# Patient Record
Sex: Male | Born: 1935
Health system: Southern US, Community
[De-identification: ages and names within clinical notes are randomized; demographics above are authoritative.]

## PROBLEM LIST (undated history)

## (undated) DIAGNOSIS — Z8546 Personal history of malignant neoplasm of prostate: Secondary | ICD-10-CM

## (undated) DIAGNOSIS — I447 Left bundle-branch block, unspecified: Secondary | ICD-10-CM

## (undated) DIAGNOSIS — K4090 Unilateral inguinal hernia, without obstruction or gangrene, not specified as recurrent: Secondary | ICD-10-CM

## (undated) DIAGNOSIS — G20 Parkinson's disease: Secondary | ICD-10-CM

## (undated) DIAGNOSIS — Z860100 Personal history of colon polyps, unspecified: Secondary | ICD-10-CM

## (undated) DIAGNOSIS — I10 Essential (primary) hypertension: Secondary | ICD-10-CM

## (undated) DIAGNOSIS — Z7901 Long term (current) use of anticoagulants: Secondary | ICD-10-CM

## (undated) DIAGNOSIS — I6523 Occlusion and stenosis of bilateral carotid arteries: Secondary | ICD-10-CM

## (undated) DIAGNOSIS — E785 Hyperlipidemia, unspecified: Secondary | ICD-10-CM

## (undated) DIAGNOSIS — Z954 Presence of other heart-valve replacement: Secondary | ICD-10-CM

## (undated) DIAGNOSIS — I712 Thoracic aortic aneurysm, without rupture, unspecified: Secondary | ICD-10-CM

## (undated) DIAGNOSIS — K644 Residual hemorrhoidal skin tags: Secondary | ICD-10-CM

## (undated) DIAGNOSIS — B029 Zoster without complications: Secondary | ICD-10-CM

## (undated) DIAGNOSIS — K5909 Other constipation: Secondary | ICD-10-CM

## (undated) DIAGNOSIS — I359 Nonrheumatic aortic valve disorder, unspecified: Secondary | ICD-10-CM

## (undated) HISTORY — DX: Zoster without complications: B02.9

## (undated) HISTORY — DX: Essential (primary) hypertension: I10

## (undated) HISTORY — DX: Personal history of malignant neoplasm of prostate: Z85.46

## (undated) HISTORY — DX: Other constipation: K59.09

## (undated) HISTORY — DX: Long term (current) use of anticoagulants: Z79.01

## (undated) HISTORY — DX: Nonrheumatic aortic valve disorder, unspecified: I35.9

## (undated) HISTORY — DX: Residual hemorrhoidal skin tags: K64.4

## (undated) HISTORY — DX: Thoracic aortic aneurysm, without rupture: I71.2

## (undated) HISTORY — DX: Thoracic aortic aneurysm, without rupture, unspecified: I71.20

## (undated) HISTORY — DX: Left bundle-branch block, unspecified: I44.7

## (undated) HISTORY — PX: TONSILLECTOMY: SUR1361

## (undated) HISTORY — DX: Hyperlipidemia, unspecified: E78.5

## (undated) HISTORY — DX: Presence of other heart-valve replacement: Z95.4

## (undated) HISTORY — DX: Personal history of colonic polyps: Z86.010

## (undated) HISTORY — DX: Personal history of colon polyps, unspecified: Z86.0100

## (undated) HISTORY — PX: HEMORRHOID SURGERY: SHX153

---
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
## 1989-01-17 HISTORY — PX: AORTIC VALVE REPLACEMENT: SHX41

## 1993-01-17 HISTORY — PX: PROSTATECTOMY: SHX69

## 1996-04-24 ENCOUNTER — Encounter: Payer: Self-pay | Admitting: Internal Medicine

## 2001-04-26 ENCOUNTER — Encounter: Payer: Self-pay | Admitting: Internal Medicine

## 2003-11-28 ENCOUNTER — Ambulatory Visit: Payer: Self-pay | Admitting: Cardiology

## 2003-12-24 ENCOUNTER — Ambulatory Visit: Payer: Self-pay | Admitting: *Deleted

## 2004-01-21 ENCOUNTER — Ambulatory Visit: Payer: Self-pay | Admitting: Cardiology

## 2004-02-11 ENCOUNTER — Ambulatory Visit: Payer: Self-pay | Admitting: Cardiology

## 2004-02-25 ENCOUNTER — Ambulatory Visit: Payer: Self-pay | Admitting: Internal Medicine

## 2004-03-24 ENCOUNTER — Ambulatory Visit: Payer: Self-pay | Admitting: Cardiology

## 2004-04-14 ENCOUNTER — Ambulatory Visit: Payer: Self-pay | Admitting: Internal Medicine

## 2004-04-21 ENCOUNTER — Ambulatory Visit: Payer: Self-pay | Admitting: Cardiology

## 2004-04-22 ENCOUNTER — Ambulatory Visit: Payer: Self-pay | Admitting: Internal Medicine

## 2004-05-07 ENCOUNTER — Ambulatory Visit: Payer: Self-pay | Admitting: Cardiology

## 2004-05-19 ENCOUNTER — Ambulatory Visit: Payer: Self-pay | Admitting: Cardiology

## 2004-06-16 ENCOUNTER — Ambulatory Visit: Payer: Self-pay | Admitting: Cardiology

## 2004-07-14 ENCOUNTER — Ambulatory Visit: Payer: Self-pay | Admitting: Cardiology

## 2004-08-11 ENCOUNTER — Ambulatory Visit: Payer: Self-pay | Admitting: Cardiovascular Disease

## 2004-08-13 ENCOUNTER — Ambulatory Visit: Payer: Self-pay | Admitting: Internal Medicine

## 2004-08-31 ENCOUNTER — Ambulatory Visit: Payer: Self-pay

## 2004-08-31 ENCOUNTER — Ambulatory Visit: Payer: Self-pay | Admitting: Cardiology

## 2004-09-02 ENCOUNTER — Ambulatory Visit: Payer: Self-pay | Admitting: Cardiology

## 2004-09-08 ENCOUNTER — Ambulatory Visit: Payer: Self-pay | Admitting: Cardiovascular Disease

## 2004-10-13 ENCOUNTER — Ambulatory Visit: Payer: Self-pay | Admitting: Cardiology

## 2004-11-10 ENCOUNTER — Ambulatory Visit: Payer: Self-pay | Admitting: Cardiology

## 2004-12-01 ENCOUNTER — Ambulatory Visit: Payer: Self-pay | Admitting: Cardiology

## 2004-12-03 ENCOUNTER — Ambulatory Visit: Payer: Self-pay | Admitting: Cardiology

## 2004-12-15 ENCOUNTER — Ambulatory Visit: Payer: Self-pay | Admitting: Cardiology

## 2004-12-29 ENCOUNTER — Ambulatory Visit: Payer: Self-pay | Admitting: Internal Medicine

## 2005-01-13 ENCOUNTER — Ambulatory Visit: Payer: Self-pay | Admitting: *Deleted

## 2005-01-25 ENCOUNTER — Ambulatory Visit: Payer: Self-pay | Admitting: *Deleted

## 2005-01-28 ENCOUNTER — Ambulatory Visit: Payer: Self-pay | Admitting: Cardiology

## 2005-02-16 ENCOUNTER — Ambulatory Visit: Payer: Self-pay | Admitting: Cardiology

## 2005-03-02 ENCOUNTER — Ambulatory Visit: Payer: Self-pay | Admitting: Cardiology

## 2005-03-08 ENCOUNTER — Ambulatory Visit: Payer: Self-pay | Admitting: Cardiology

## 2005-03-09 ENCOUNTER — Ambulatory Visit: Payer: Self-pay | Admitting: Cardiology

## 2005-03-16 ENCOUNTER — Ambulatory Visit: Payer: Self-pay | Admitting: Cardiology

## 2005-04-13 ENCOUNTER — Ambulatory Visit: Payer: Self-pay | Admitting: Cardiology

## 2005-05-11 ENCOUNTER — Ambulatory Visit: Payer: Self-pay | Admitting: Cardiology

## 2005-06-08 ENCOUNTER — Ambulatory Visit: Payer: Self-pay | Admitting: Internal Medicine

## 2005-06-22 ENCOUNTER — Ambulatory Visit: Payer: Self-pay | Admitting: Cardiology

## 2005-07-04 ENCOUNTER — Ambulatory Visit: Payer: Self-pay | Admitting: Cardiology

## 2005-07-27 ENCOUNTER — Ambulatory Visit: Payer: Self-pay | Admitting: Cardiovascular Disease

## 2005-08-18 ENCOUNTER — Ambulatory Visit: Payer: Self-pay | Admitting: Internal Medicine

## 2005-08-23 ENCOUNTER — Ambulatory Visit: Payer: Self-pay | Admitting: Internal Medicine

## 2005-08-24 ENCOUNTER — Ambulatory Visit: Payer: Self-pay | Admitting: Cardiology

## 2005-09-06 ENCOUNTER — Ambulatory Visit: Payer: Self-pay | Admitting: Cardiology

## 2005-09-14 ENCOUNTER — Ambulatory Visit: Payer: Self-pay | Admitting: Cardiology

## 2005-09-28 ENCOUNTER — Ambulatory Visit: Payer: Self-pay | Admitting: Internal Medicine

## 2005-10-19 ENCOUNTER — Ambulatory Visit: Payer: Self-pay | Admitting: Cardiology

## 2005-11-16 ENCOUNTER — Ambulatory Visit: Payer: Self-pay | Admitting: Cardiovascular Disease

## 2005-12-09 ENCOUNTER — Ambulatory Visit: Payer: Self-pay | Admitting: Cardiology

## 2006-01-04 ENCOUNTER — Ambulatory Visit: Payer: Self-pay | Admitting: Cardiology

## 2006-02-01 ENCOUNTER — Ambulatory Visit: Payer: Self-pay | Admitting: Cardiology

## 2006-03-01 ENCOUNTER — Ambulatory Visit: Payer: Self-pay | Admitting: Cardiology

## 2006-03-21 ENCOUNTER — Ambulatory Visit: Payer: Self-pay | Admitting: Cardiology

## 2006-03-21 LAB — CONVERTED CEMR LAB
BUN: 19 mg/dL (ref 6–23)
Calcium: 9.1 mg/dL (ref 8.4–10.5)
Creatinine, Ser: 1.2 mg/dL (ref 0.4–1.5)
GFR calc Af Amer: 76 mL/min
GFR calc non Af Amer: 63 mL/min

## 2006-03-29 ENCOUNTER — Ambulatory Visit: Payer: Self-pay | Admitting: Cardiology

## 2006-04-26 ENCOUNTER — Ambulatory Visit: Payer: Self-pay | Admitting: Cardiology

## 2006-05-24 ENCOUNTER — Ambulatory Visit: Payer: Self-pay | Admitting: Cardiology

## 2006-06-07 ENCOUNTER — Ambulatory Visit: Payer: Self-pay | Admitting: Cardiology

## 2006-06-19 ENCOUNTER — Ambulatory Visit: Payer: Self-pay | Admitting: Cardiovascular Disease

## 2006-07-05 ENCOUNTER — Ambulatory Visit: Payer: Self-pay | Admitting: Cardiology

## 2006-08-02 ENCOUNTER — Ambulatory Visit: Payer: Self-pay | Admitting: Cardiovascular Disease

## 2006-08-30 ENCOUNTER — Ambulatory Visit: Payer: Self-pay | Admitting: Cardiology

## 2006-09-13 ENCOUNTER — Ambulatory Visit: Payer: Self-pay | Admitting: Internal Medicine

## 2006-09-14 ENCOUNTER — Ambulatory Visit: Payer: Self-pay | Admitting: Internal Medicine

## 2006-09-14 LAB — CONVERTED CEMR LAB
AST: 27 units/L (ref 0–37)
Alkaline Phosphatase: 70 units/L (ref 39–117)
BUN: 20 mg/dL (ref 6–23)
Basophils Absolute: 0 10*3/uL (ref 0.0–0.1)
CO2: 32 meq/L (ref 19–32)
Chloride: 107 meq/L (ref 96–112)
GFR calc Af Amer: 84 mL/min
GFR calc non Af Amer: 69 mL/min
Glucose, Bld: 107 mg/dL — ABNORMAL HIGH (ref 70–99)
HCT: 40.7 % (ref 39.0–52.0)
HDL: 35.8 mg/dL — ABNORMAL LOW (ref >39.0)
Hemoglobin, Urine: NEGATIVE
Hemoglobin: 14.4 g/dL (ref 13.0–17.0)
LDL Cholesterol: 62 mg/dL (ref 0–99)
Leukocytes, UA: NEGATIVE
MCHC: 35.5 g/dL (ref 30.0–36.0)
Monocytes Absolute: 0.5 10*3/uL (ref 0.2–0.7)
Neutro Abs: 2.3 10*3/uL (ref 1.4–7.7)
Neutrophils Relative %: 54.4 % (ref 43.0–77.0)
Nitrite: NEGATIVE
PSA: 0.01 ng/mL — ABNORMAL LOW (ref 0.10–4.00)
Potassium: 4.4 meq/L (ref 3.5–5.1)
Sodium: 144 meq/L (ref 135–145)
TSH: 3.04 microintl units/mL (ref 0.35–5.50)
pH: 6 (ref 5.0–8.0)

## 2006-09-19 ENCOUNTER — Ambulatory Visit: Payer: Self-pay | Admitting: Cardiology

## 2006-09-21 ENCOUNTER — Ambulatory Visit: Payer: Self-pay | Admitting: Internal Medicine

## 2006-09-26 ENCOUNTER — Encounter: Payer: Self-pay | Admitting: Cardiology

## 2006-09-26 ENCOUNTER — Ambulatory Visit: Payer: Self-pay

## 2006-10-04 ENCOUNTER — Ambulatory Visit: Payer: Self-pay | Admitting: Cardiology

## 2006-10-05 ENCOUNTER — Ambulatory Visit: Payer: Self-pay | Admitting: Internal Medicine

## 2006-11-01 ENCOUNTER — Ambulatory Visit: Payer: Self-pay | Admitting: Cardiology

## 2006-11-29 ENCOUNTER — Ambulatory Visit: Payer: Self-pay | Admitting: Cardiology

## 2006-12-27 ENCOUNTER — Ambulatory Visit: Payer: Self-pay | Admitting: Cardiology

## 2007-01-10 ENCOUNTER — Ambulatory Visit: Payer: Self-pay | Admitting: Cardiology

## 2007-02-07 ENCOUNTER — Ambulatory Visit: Payer: Self-pay | Admitting: Cardiovascular Disease

## 2007-03-07 ENCOUNTER — Ambulatory Visit: Payer: Self-pay | Admitting: Cardiology

## 2007-03-20 ENCOUNTER — Ambulatory Visit: Payer: Self-pay | Admitting: Cardiology

## 2007-03-20 LAB — CONVERTED CEMR LAB
CO2: 33 meq/L — ABNORMAL HIGH (ref 19–32)
Chloride: 102 meq/L (ref 96–112)
GFR calc non Af Amer: 69 mL/min
Glucose, Bld: 96 mg/dL (ref 70–99)
Potassium: 4.1 meq/L (ref 3.5–5.1)

## 2007-03-22 ENCOUNTER — Ambulatory Visit: Payer: Self-pay | Admitting: Cardiovascular Disease

## 2007-03-28 ENCOUNTER — Ambulatory Visit: Payer: Self-pay | Admitting: Cardiology

## 2007-04-10 ENCOUNTER — Encounter: Payer: Self-pay | Admitting: Internal Medicine

## 2007-04-25 ENCOUNTER — Ambulatory Visit: Payer: Self-pay | Admitting: Internal Medicine

## 2007-05-17 ENCOUNTER — Encounter: Payer: Self-pay | Admitting: Internal Medicine

## 2007-05-17 ENCOUNTER — Ambulatory Visit: Payer: Self-pay | Admitting: Cardiothoracic Surgery

## 2007-05-22 ENCOUNTER — Ambulatory Visit: Payer: Self-pay | Admitting: Internal Medicine

## 2007-05-23 ENCOUNTER — Ambulatory Visit: Payer: Self-pay | Admitting: Cardiology

## 2007-06-06 ENCOUNTER — Ambulatory Visit: Payer: Self-pay | Admitting: Cardiovascular Disease

## 2007-06-08 ENCOUNTER — Encounter: Payer: Self-pay | Admitting: Internal Medicine

## 2007-06-08 ENCOUNTER — Ambulatory Visit: Payer: Self-pay | Admitting: Internal Medicine

## 2007-06-13 ENCOUNTER — Encounter: Payer: Self-pay | Admitting: Internal Medicine

## 2007-07-04 ENCOUNTER — Ambulatory Visit: Payer: Self-pay | Admitting: Cardiology

## 2007-08-01 ENCOUNTER — Ambulatory Visit: Payer: Self-pay | Admitting: Cardiovascular Disease

## 2007-08-22 ENCOUNTER — Ambulatory Visit: Payer: Self-pay | Admitting: Internal Medicine

## 2007-09-19 ENCOUNTER — Ambulatory Visit: Payer: Self-pay | Admitting: Cardiology

## 2007-10-02 ENCOUNTER — Ambulatory Visit: Payer: Self-pay | Admitting: Cardiology

## 2007-10-02 ENCOUNTER — Ambulatory Visit: Payer: Self-pay | Admitting: Internal Medicine

## 2007-10-02 LAB — CONVERTED CEMR LAB
ALT: 29 units/L (ref 0–53)
AST: 31 units/L (ref 0–37)
BUN: 18 mg/dL (ref 6–23)
Basophils Absolute: 0 10*3/uL (ref 0.0–0.1)
Bilirubin Urine: NEGATIVE
Bilirubin, Direct: 0.4 mg/dL — ABNORMAL HIGH (ref 0.0–0.3)
CO2: 32 meq/L (ref 19–32)
Chloride: 108 meq/L (ref 96–112)
Cholesterol: 129 mg/dL (ref 0–200)
Creatinine, Ser: 1 mg/dL (ref 0.4–1.5)
GFR calc non Af Amer: 77 mL/min
Glucose, Bld: 112 mg/dL — ABNORMAL HIGH (ref 70–99)
HCT: 40.5 % (ref 39.0–52.0)
Hemoglobin, Urine: NEGATIVE
Ketones, ur: NEGATIVE mg/dL
Leukocytes, UA: NEGATIVE
MCHC: 34.6 g/dL (ref 30.0–36.0)
MCV: 91 fL (ref 78.0–100.0)
Monocytes Absolute: 0.4 10*3/uL (ref 0.1–1.0)
Monocytes Relative: 13.5 % — ABNORMAL HIGH (ref 3.0–12.0)
Neutro Abs: 1.9 10*3/uL (ref 1.4–7.7)
Neutrophils Relative %: 55 % (ref 43.0–77.0)
PSA: 0.01 ng/mL — ABNORMAL LOW (ref 0.10–4.00)
Platelets: 239 10*3/uL (ref 150–400)
Potassium: 4 meq/L (ref 3.5–5.1)
RDW: 13 % (ref 11.5–14.6)
Specific Gravity, Urine: 1.01 (ref 1.000–1.03)
Total Bilirubin: 1.9 mg/dL — ABNORMAL HIGH (ref 0.3–1.2)
Total CHOL/HDL Ratio: 3.7
Total Protein: 6.6 g/dL (ref 6.0–8.3)
VLDL: 15 mg/dL (ref 0–40)

## 2007-10-23 ENCOUNTER — Ambulatory Visit: Payer: Self-pay | Admitting: Internal Medicine

## 2007-10-24 ENCOUNTER — Ambulatory Visit: Payer: Self-pay | Admitting: Cardiology

## 2007-10-24 DIAGNOSIS — E785 Hyperlipidemia, unspecified: Secondary | ICD-10-CM

## 2007-10-24 DIAGNOSIS — Z8546 Personal history of malignant neoplasm of prostate: Secondary | ICD-10-CM

## 2007-10-24 DIAGNOSIS — I10 Essential (primary) hypertension: Secondary | ICD-10-CM

## 2007-10-24 DIAGNOSIS — Z8669 Personal history of other diseases of the nervous system and sense organs: Secondary | ICD-10-CM

## 2007-11-01 ENCOUNTER — Ambulatory Visit: Payer: Self-pay | Admitting: Cardiology

## 2007-11-21 ENCOUNTER — Ambulatory Visit: Payer: Self-pay | Admitting: Cardiology

## 2007-11-29 ENCOUNTER — Ambulatory Visit: Payer: Self-pay | Admitting: Cardiothoracic Surgery

## 2007-12-19 ENCOUNTER — Ambulatory Visit: Payer: Self-pay | Admitting: Cardiology

## 2008-01-16 ENCOUNTER — Ambulatory Visit: Payer: Self-pay | Admitting: Cardiology

## 2008-02-13 ENCOUNTER — Ambulatory Visit: Payer: Self-pay | Admitting: Cardiovascular Disease

## 2008-03-12 ENCOUNTER — Ambulatory Visit: Payer: Self-pay | Admitting: Cardiology

## 2008-03-21 ENCOUNTER — Ambulatory Visit: Payer: Self-pay | Admitting: Cardiology

## 2008-03-21 LAB — CONVERTED CEMR LAB
BUN: 19 mg/dL (ref 6–23)
Calcium: 9.7 mg/dL (ref 8.4–10.5)
Chloride: 102 meq/L (ref 96–112)
Creatinine, Ser: 1 mg/dL (ref 0.4–1.5)
Glucose, Bld: 85 mg/dL (ref 70–99)
Potassium: 3.7 meq/L (ref 3.5–5.1)
Sodium: 141 meq/L (ref 135–145)

## 2008-04-09 ENCOUNTER — Ambulatory Visit: Payer: Self-pay | Admitting: Cardiology

## 2008-05-07 ENCOUNTER — Ambulatory Visit: Payer: Self-pay | Admitting: Cardiology

## 2008-05-28 ENCOUNTER — Ambulatory Visit: Payer: Self-pay | Admitting: Cardiology

## 2008-05-30 ENCOUNTER — Telehealth: Payer: Self-pay | Admitting: Internal Medicine

## 2008-06-17 ENCOUNTER — Encounter: Payer: Self-pay | Admitting: *Deleted

## 2008-06-25 ENCOUNTER — Ambulatory Visit: Payer: Self-pay | Admitting: Internal Medicine

## 2008-06-25 LAB — CONVERTED CEMR LAB: Protime: 18.8

## 2008-07-23 ENCOUNTER — Encounter: Payer: Self-pay | Admitting: *Deleted

## 2008-07-23 ENCOUNTER — Ambulatory Visit: Payer: Self-pay | Admitting: Cardiovascular Disease

## 2008-07-31 ENCOUNTER — Telehealth (INDEPENDENT_AMBULATORY_CARE_PROVIDER_SITE_OTHER): Payer: Self-pay | Admitting: *Deleted

## 2008-08-04 ENCOUNTER — Ambulatory Visit: Payer: Self-pay | Admitting: Internal Medicine

## 2008-08-04 DIAGNOSIS — K5909 Other constipation: Secondary | ICD-10-CM

## 2008-08-12 ENCOUNTER — Telehealth: Payer: Self-pay | Admitting: Internal Medicine

## 2008-08-20 ENCOUNTER — Ambulatory Visit: Payer: Self-pay | Admitting: Cardiology

## 2008-08-20 LAB — CONVERTED CEMR LAB: POC INR: 2.8

## 2008-09-12 ENCOUNTER — Ambulatory Visit: Payer: Self-pay | Admitting: Internal Medicine

## 2008-09-12 DIAGNOSIS — Z860100 Personal history of colon polyps, unspecified: Secondary | ICD-10-CM | POA: Insufficient documentation

## 2008-09-12 DIAGNOSIS — K644 Residual hemorrhoidal skin tags: Secondary | ICD-10-CM | POA: Insufficient documentation

## 2008-09-17 ENCOUNTER — Ambulatory Visit: Payer: Self-pay | Admitting: Cardiology

## 2008-09-17 LAB — CONVERTED CEMR LAB: POC INR: 3

## 2008-09-20 DIAGNOSIS — I359 Nonrheumatic aortic valve disorder, unspecified: Secondary | ICD-10-CM

## 2008-09-29 ENCOUNTER — Ambulatory Visit: Payer: Self-pay | Admitting: Cardiology

## 2008-09-29 DIAGNOSIS — I712 Thoracic aortic aneurysm, without rupture: Secondary | ICD-10-CM

## 2008-09-29 DIAGNOSIS — Z954 Presence of other heart-valve replacement: Secondary | ICD-10-CM

## 2008-09-30 LAB — CONVERTED CEMR LAB
Chloride: 105 meq/L (ref 96–112)
Creatinine, Ser: 1.1 mg/dL (ref 0.4–1.5)
Magnesium: 2.2 mg/dL (ref 1.5–2.5)
Potassium: 3.9 meq/L (ref 3.5–5.1)
Sodium: 143 meq/L (ref 135–145)

## 2008-10-15 ENCOUNTER — Ambulatory Visit: Payer: Self-pay | Admitting: Cardiology

## 2008-10-15 LAB — CONVERTED CEMR LAB: POC INR: 2.7

## 2008-11-07 ENCOUNTER — Ambulatory Visit: Payer: Self-pay | Admitting: Internal Medicine

## 2008-11-12 ENCOUNTER — Ambulatory Visit: Payer: Self-pay | Admitting: Cardiology

## 2008-11-12 LAB — CONVERTED CEMR LAB: POC INR: 2.5

## 2008-11-13 ENCOUNTER — Ambulatory Visit: Payer: Self-pay | Admitting: Internal Medicine

## 2008-11-13 LAB — CONVERTED CEMR LAB
ALT: 28 units/L (ref 0–53)
AST: 27 units/L (ref 0–37)
BUN: 19 mg/dL (ref 6–23)
Bilirubin, Direct: 0.4 mg/dL — ABNORMAL HIGH (ref 0.0–0.3)
Eosinophils Relative: 3.2 % (ref 0.0–5.0)
GFR calc non Af Amer: 76.95 mL/min (ref >60)
HCT: 42.3 % (ref 39.0–52.0)
Hemoglobin, Urine: NEGATIVE
LDL Cholesterol: 68 mg/dL (ref 0–99)
Lymphs Abs: 1.1 10*3/uL (ref 0.7–4.0)
Monocytes Relative: 12.9 % — ABNORMAL HIGH (ref 3.0–12.0)
Neutrophils Relative %: 55 % (ref 43.0–77.0)
Nitrite: NEGATIVE
PSA: 0.01 ng/mL — ABNORMAL LOW (ref 0.10–4.00)
Platelets: 205 10*3/uL (ref 150.0–400.0)
Potassium: 4.2 meq/L (ref 3.5–5.1)
Total Bilirubin: 2.1 mg/dL — ABNORMAL HIGH (ref 0.3–1.2)
Total CHOL/HDL Ratio: 3
Urobilinogen, UA: 1 (ref 0.0–1.0)
VLDL: 11.6 mg/dL (ref 0.0–40.0)
WBC: 4 10*3/uL — ABNORMAL LOW (ref 4.5–10.5)

## 2008-11-20 ENCOUNTER — Ambulatory Visit: Payer: Self-pay | Admitting: Internal Medicine

## 2008-12-04 ENCOUNTER — Ambulatory Visit: Payer: Self-pay | Admitting: Cardiothoracic Surgery

## 2008-12-04 ENCOUNTER — Encounter: Payer: Self-pay | Admitting: Cardiology

## 2008-12-10 ENCOUNTER — Ambulatory Visit: Payer: Self-pay | Admitting: Cardiology

## 2009-01-07 ENCOUNTER — Ambulatory Visit: Payer: Self-pay | Admitting: Cardiology

## 2009-02-04 ENCOUNTER — Ambulatory Visit: Payer: Self-pay | Admitting: Cardiology

## 2009-03-04 ENCOUNTER — Ambulatory Visit: Payer: Self-pay | Admitting: Cardiology

## 2009-03-04 LAB — CONVERTED CEMR LAB: POC INR: 2.5

## 2009-04-01 ENCOUNTER — Ambulatory Visit: Payer: Self-pay | Admitting: Cardiology

## 2009-04-29 ENCOUNTER — Ambulatory Visit: Payer: Self-pay | Admitting: Cardiovascular Disease

## 2009-05-20 ENCOUNTER — Ambulatory Visit: Payer: Self-pay | Admitting: Cardiovascular Disease

## 2009-05-20 LAB — CONVERTED CEMR LAB: POC INR: 2.7

## 2009-06-17 ENCOUNTER — Ambulatory Visit: Payer: Self-pay | Admitting: Cardiology

## 2009-06-17 LAB — CONVERTED CEMR LAB: POC INR: 2.9

## 2009-07-22 ENCOUNTER — Ambulatory Visit: Payer: Self-pay | Admitting: Cardiology

## 2009-08-10 ENCOUNTER — Ambulatory Visit: Payer: Self-pay | Admitting: Cardiology

## 2009-08-19 ENCOUNTER — Ambulatory Visit: Payer: Self-pay | Admitting: Cardiovascular Disease

## 2009-08-19 LAB — CONVERTED CEMR LAB: POC INR: 2.8

## 2009-09-16 ENCOUNTER — Ambulatory Visit: Payer: Self-pay | Admitting: Cardiovascular Disease

## 2009-09-16 LAB — CONVERTED CEMR LAB: POC INR: 2.5

## 2009-10-14 ENCOUNTER — Ambulatory Visit: Payer: Self-pay | Admitting: Cardiology

## 2009-11-04 ENCOUNTER — Ambulatory Visit: Payer: Self-pay | Admitting: Cardiology

## 2009-11-20 ENCOUNTER — Ambulatory Visit: Payer: Self-pay | Admitting: Internal Medicine

## 2009-11-20 LAB — CONVERTED CEMR LAB
AST: 26 units/L (ref 0–37)
BUN: 20 mg/dL (ref 6–23)
Basophils Absolute: 0 10*3/uL (ref 0.0–0.1)
Bilirubin Urine: NEGATIVE
CO2: 31 meq/L (ref 19–32)
Cholesterol: 124 mg/dL (ref 0–200)
Eosinophils Absolute: 0.1 10*3/uL (ref 0.0–0.7)
GFR calc non Af Amer: 73.35 mL/min (ref >60)
Glucose, Bld: 94 mg/dL (ref 70–99)
HCT: 41.2 % (ref 39.0–52.0)
HDL: 41.4 mg/dL (ref >39.00)
Hemoglobin, Urine: NEGATIVE
Ketones, ur: NEGATIVE mg/dL
Lymphs Abs: 1.2 10*3/uL (ref 0.7–4.0)
MCHC: 34.8 g/dL (ref 30.0–36.0)
Monocytes Absolute: 0.5 10*3/uL (ref 0.1–1.0)
Monocytes Relative: 11.9 % (ref 3.0–12.0)
Platelets: 215 10*3/uL (ref 150.0–400.0)
Potassium: 4.1 meq/L (ref 3.5–5.1)
RDW: 13.2 % (ref 11.5–14.6)
TSH: 3.8 microintl units/mL (ref 0.35–5.50)
Total Bilirubin: 1.9 mg/dL — ABNORMAL HIGH (ref 0.3–1.2)
Total Protein, Urine: NEGATIVE mg/dL
Triglycerides: 59 mg/dL (ref 0.0–149.0)
Urine Glucose: NEGATIVE mg/dL
VLDL: 11.8 mg/dL (ref 0.0–40.0)
pH: 7 (ref 5.0–8.0)

## 2009-11-25 ENCOUNTER — Ambulatory Visit: Payer: Self-pay | Admitting: Cardiology

## 2009-11-26 ENCOUNTER — Ambulatory Visit: Payer: Self-pay | Admitting: Internal Medicine

## 2009-11-26 DIAGNOSIS — B029 Zoster without complications: Secondary | ICD-10-CM | POA: Insufficient documentation

## 2009-11-26 HISTORY — DX: Zoster without complications: B02.9

## 2009-12-23 ENCOUNTER — Ambulatory Visit: Payer: Self-pay | Admitting: Cardiology

## 2009-12-23 LAB — CONVERTED CEMR LAB: POC INR: 2.8

## 2010-01-20 ENCOUNTER — Ambulatory Visit: Admission: RE | Admit: 2010-01-20 | Discharge: 2010-01-20 | Payer: Self-pay | Source: Home / Self Care

## 2010-01-20 LAB — CONVERTED CEMR LAB: POC INR: 2.1

## 2010-02-03 ENCOUNTER — Ambulatory Visit: Admission: RE | Admit: 2010-02-03 | Discharge: 2010-02-03 | Payer: Self-pay | Source: Home / Self Care

## 2010-02-16 NOTE — Medication Information (Signed)
Summary: rov/jaj  Anticoagulant Therapy  Managed by: Bethena Midget, RN, BSN Referring MD: Shawnie Pons MD PCP: Jacques Navy MD Supervising MD: Myrtis Ser MD, Tinnie Gens Indication 1: Aortic Valve Replacement (ICD-V43.3) Indication 2: Hyperlipidemia (ICD-Incr L) Lab Used: [facility] Site: [facility] INR POC 2.4 INR RANGE 2.5 - 3.5  Dietary changes: no    Health status changes: no    Bleeding/hemorrhagic complications: no    Recent/future hospitalizations: no    Any changes in medication regimen? no    Recent/future dental: no  Any missed doses?: no       Is patient compliant with meds? yes       Allergies: 1)  ! Codeine  Anticoagulation Management History:      The patient is taking warfarin and comes in today for a routine follow up visit.  Positive risk factors for bleeding include an age of 75 years or older.  The bleeding index is 'intermediate risk'.  Positive CHADS2 values include History of HTN and Age > 75 years old.  The start date was 05/02/1997.  Anticoagulation responsible provider: Myrtis Ser MD, Tinnie Gens.  INR POC: 2.4.  Cuvette Lot#: [id number].  Exp: 11/2010.    Anticoagulation Management Assessment/Plan:      The patient's current anticoagulation dose is Coumadin 5 mg tabs: Take as directed by coumadin clinic..  The target INR is 2.5 - 3.5.  The next INR is due 11/25/2009.  Anticoagulation instructions were given to patient.  Results were reviewed/authorized by Bethena Midget, RN, BSN.  He was notified by Bethena Midget, RN, BSN.         Prior Anticoagulation Instructions: INR 2.2  Take 2 tablets today. Then resume regular dose of 1 1/2 tablets everyday except 1 tablet on Mondays, Wednesdays, and Fridays. Re-check INR 3 weeks.   Current Anticoagulation Instructions: INR 2.4 Today take 7.5mg s then resume 7.5mg s daily except 5mg s on Mondays, Wednesdays and Fridays. Recheck in 3 weeks.

## 2010-02-16 NOTE — Medication Information (Signed)
Summary: rov/tm  Anticoagulant Therapy  Managed by: Cloyde Reams, RN, BSN Referring MD: Shawnie Pons MD PCP: Jacques Navy MD Supervising MD: Shirlee Latch MD, Neveen Daponte Indication 1: Aortic Valve Replacement (ICD-V43.3) Indication 2: Hyperlipidemia (ICD-Incr L) Lab Used: [facility] Site: [facility] INR POC 2.9 INR RANGE 2.5 - 3.5  Dietary changes: no    Health status changes: no    Bleeding/hemorrhagic complications: no    Recent/future hospitalizations: no    Any changes in medication regimen? yes       Details: Took OTC cold med, Tylenol based.   Recent/future dental: no  Any missed doses?: no       Is patient compliant with meds? yes       Allergies: 1)  ! Codeine  Anticoagulation Management History:      The patient is taking warfarin and comes in today for a routine follow up visit.  Positive risk factors for bleeding include an age of 23 years or older.  The bleeding index is 'intermediate risk'.  Positive CHADS2 values include History of HTN and Age > 33 years old.  The start date was 05/02/1997.  Anticoagulation responsible provider: Shirlee Latch MD, Catheryne Deford.  INR POC: 2.9.  Cuvette Lot#: [id number].  Exp: 09/2010.    Anticoagulation Management Assessment/Plan:      The patient's current anticoagulation dose is Coumadin 5 mg tabs: Take as directed by coumadin clinic..  The target INR is 2.5 - 3.5.  The next INR is due 08/19/2009.  Anticoagulation instructions were given to patient.  Results were reviewed/authorized by Cloyde Reams, RN, BSN.  He was notified by Cloyde Reams RN.         Prior Anticoagulation Instructions: INR 2.9 Continue 7.5mg  everyday except 5mg  on Mondays, Wednesdays and Fridays. Recheck in 4 weeks.   Current Anticoagulation Instructions: INR 2.9  Continue on same dosage 7.5mg  daily except 5mg  on Mondays, Wednesdays, and Fridays.  Recheck in 4 weeks.

## 2010-02-16 NOTE — Medication Information (Signed)
Summary: rov/ewj  Anticoagulant Therapy  Managed by: Weston Brass, PharmD Referring MD: Shawnie Pons MD PCP: Jacques Navy MD Supervising MD: Eden Emms MD, Theron Arista Indication 1: Aortic Valve Replacement (ICD-V43.3) Indication 2: Hyperlipidemia (ICD-Incr L) Lab Used: [facility] Site: [facility] INR POC 2.0 INR RANGE 2.5 - 3.5  Dietary changes: no    Health status changes: no    Bleeding/hemorrhagic complications: no    Recent/future hospitalizations: no    Any changes in medication regimen? no    Recent/future dental: no  Any missed doses?: yes     Details: not sure if he missed yesterdays dose  Is patient compliant with meds? yes       Allergies: 1)  ! Codeine  Anticoagulation Management History:      The patient is taking warfarin and comes in today for a routine follow up visit.  Positive risk factors for bleeding include an age of 75 years or older.  The bleeding index is 'intermediate risk'.  Positive CHADS2 values include History of HTN and Age > 75 years old.  The start date was 05/02/1997.  Anticoagulation responsible provider: Eden Emms MD, Theron Arista.  INR POC: 2.0.  Cuvette Lot#: [id number].  Exp: 05/2010.    Anticoagulation Management Assessment/Plan:      The patient's current anticoagulation dose is Coumadin 5 mg tabs: Take as directed by coumadin clinic..  The target INR is 2.5 - 3.5.  The next INR is due 05/20/2009.  Anticoagulation instructions were given to patient.  Results were reviewed/authorized by Weston Brass, PharmD.  He was notified by Weston Brass PharmD.         Prior Anticoagulation Instructions: INR 2.5  Continue on same dosage 7.5mg  daily except 5mg  on Mondays, Wednesdays, and Fridays.  Recheck in 4 weeks.    Current Anticoagulation Instructions: INR 2.0 Today 10mg s then resume 7.5mg s daily except 5mg s on Mondays, Wednesdays and Fridays. Recheck in 3 weeks.

## 2010-02-16 NOTE — Medication Information (Signed)
Summary: rov coumadin - [facility]  Anticoagulant Therapy  Managed by: Weston Brass, PharmD Referring MD: Shawnie Pons MD PCP: Jacques Navy MD Supervising MD: Cassell Clement MD Indication 1: Aortic Valve Replacement (ICD-V43.3) Indication 2: Hyperlipidemia (ICD-Incr L) Lab Used: [facility] Site: [facility] INR POC 2.8 INR RANGE 2.5 - 3.5  Dietary changes: no    Health status changes: no    Bleeding/hemorrhagic complications: no    Recent/future hospitalizations: no    Any changes in medication regimen? yes       Details: changed from miralax to MOM  Recent/future dental: no  Any missed doses?: no       Is patient compliant with meds? yes       Allergies: 1)  ! Codeine  Anticoagulation Management History:      The patient is taking warfarin and comes in today for a routine follow up visit.  Positive risk factors for bleeding include an age of 75 years or older.  The bleeding index is 'intermediate risk'.  Positive CHADS2 values include History of HTN and Age > 32 years old.  The start date was 05/02/1997.  Anticoagulation responsible Fynlee Rowlands: Cassell Clement MD.  INR POC: 2.8.  Cuvette Lot#: [id number].  Exp: 10/2010.    Anticoagulation Management Assessment/Plan:      The patient's current anticoagulation dose is Coumadin 5 mg tabs: Take as directed by coumadin clinic..  The target INR is 2.5 - 3.5.  The next INR is due 01/20/2010.  Anticoagulation instructions were given to patient.  Results were reviewed/authorized by Weston Brass, PharmD.  He was notified by Weston Brass PharmD.         Prior Anticoagulation Instructions: INR 2.7  Coumadin 5mg  tab, take 1 tab Mon, Wed, Fri and 1.5tab on Tue, Thur, Sat and Sun  Current Anticoagulation Instructions: INR 2.8  Continue same dose of 1 1/2 tablets every day except 1 tablet on Monday, Wednesday and Friday.  Recheck INR in 4 weeks.

## 2010-02-16 NOTE — Medication Information (Signed)
Summary: rov/sl  Anticoagulant Therapy  Managed by: Weston Brass, PharmD Referring MD: Shawnie Pons MD PCP: Jacques Navy MD Supervising MD: Myrtis Ser MD, Tinnie Gens Indication 1: Aortic Valve Replacement (ICD-V43.3) Indication 2: Hyperlipidemia (ICD-Incr L) Lab Used: [facility] Site: [facility] INR POC 2.2 INR RANGE 2.5 - 3.5  Dietary changes: no    Health status changes: no    Bleeding/hemorrhagic complications: no    Recent/future hospitalizations: no    Any changes in medication regimen? no    Recent/future dental: no  Any missed doses?: no       Is patient compliant with meds? yes       Allergies: 1)  ! Codeine  Anticoagulation Management History:      The patient is taking warfarin and comes in today for a routine follow up visit.  Positive risk factors for bleeding include an age of 75 years or older.  The bleeding index is 'intermediate risk'.  Positive CHADS2 values include History of HTN and Age > 32 years old.  The start date was 05/02/1997.  Anticoagulation responsible provider: Myrtis Ser MD, Tinnie Gens.  INR POC: 2.2.  Cuvette Lot#: [id number].  Exp: 11/2010.    Anticoagulation Management Assessment/Plan:      The patient's current anticoagulation dose is Coumadin 5 mg tabs: Take as directed by coumadin clinic..  The target INR is 2.5 - 3.5.  The next INR is due 11/04/2009.  Anticoagulation instructions were given to patient.  Results were reviewed/authorized by Weston Brass, PharmD.  He was notified by Harrel Carina, PharmD candidate.         Prior Anticoagulation Instructions: INR 2.5  Continue taking Coumadin 1.5 tabs (7.5 mg) on Sun, Tues, Thur, Sat and Coumadin 1 tab (5 mg) on Mon, Wed, Fri. Return to clinic in 4 weeks.   Current Anticoagulation Instructions: INR 2.2  Take 2 tablets today. Then resume regular dose of 1 1/2 tablets everyday except 1 tablet on Mondays, Wednesdays, and Fridays. Re-check INR 3 weeks.

## 2010-02-16 NOTE — Medication Information (Signed)
Summary: rov/tm  Anticoagulant Therapy  Managed by: Eda Keys, PharmD Referring MD: Shawnie Pons MD PCP: Jacques Navy MD Supervising MD: Eden Emms MD, Theron Arista Indication 1: Aortic Valve Replacement (ICD-V43.3) Indication 2: Hyperlipidemia (ICD-Incr L) Lab Used: [facility] Site: [facility] INR POC 2.7 INR RANGE 2.5 - 3.5  Dietary changes: no    Health status changes: no    Bleeding/hemorrhagic complications: no    Recent/future hospitalizations: no    Any changes in medication regimen? no    Recent/future dental: no  Any missed doses?: no       Is patient compliant with meds? yes       Allergies: 1)  ! Codeine  Anticoagulation Management History:      The patient is taking warfarin and comes in today for a routine follow up visit.  Positive risk factors for bleeding include an age of 75 years or older.  The bleeding index is 'intermediate risk'.  Positive CHADS2 values include History of HTN and Age > 63 years old.  The start date was 05/02/1997.  Anticoagulation responsible provider: Eden Emms MD, Theron Arista.  INR POC: 2.7.  Cuvette Lot#: [id number].  Exp: 06/2010.    Anticoagulation Management Assessment/Plan:      The patient's current anticoagulation dose is Coumadin 5 mg tabs: Take as directed by coumadin clinic..  The target INR is 2.5 - 3.5.  The next INR is due 06/17/2009.  Anticoagulation instructions were given to patient.  Results were reviewed/authorized by Eda Keys, PharmD.  He was notified by Eda Keys.         Prior Anticoagulation Instructions: INR 2.0 Today 10mg s then resume 7.5mg s daily except 5mg s on Mondays, Wednesdays and Fridays. Recheck in 3 weeks.   Current Anticoagulation Instructions: INR 2.7  Continue current dosing schedule of 1 tablet (5 mg) on Monday, Wednesday, and Friday and 1.5 tablets (7.5 mg) all other days.  Return to clinic in 4 weeks.

## 2010-02-16 NOTE — Medication Information (Signed)
Summary: rov/ewj  Anticoagulant Therapy  Managed by: Bethena Midget, RN, BSN Referring MD: Shawnie Pons MD PCP: Jacques Navy MD Supervising MD: Myrtis Ser MD, Tinnie Gens Indication 1: Aortic Valve Replacement (ICD-V43.3) Indication 2: Hyperlipidemia (ICD-Incr L) Lab Used: [facility] Site: [facility] INR POC 2.9 INR RANGE 2.5 - 3.5  Dietary changes: no    Health status changes: no    Bleeding/hemorrhagic complications: no    Recent/future hospitalizations: no    Any changes in medication regimen? no    Recent/future dental: no  Any missed doses?: no       Is patient compliant with meds? yes       Allergies: 1)  ! Codeine  Anticoagulation Management History:      The patient is taking warfarin and comes in today for a routine follow up visit.  Positive risk factors for bleeding include an age of 75 years or older.  The bleeding index is 'intermediate risk'.  Positive CHADS2 values include History of HTN and Age > 47 years old.  The start date was 05/02/1997.  Anticoagulation responsible provider: Myrtis Ser MD, Tinnie Gens.  INR POC: 2.9.  Cuvette Lot#: [id number].  Exp: 04/2010.    Anticoagulation Management Assessment/Plan:      The patient's current anticoagulation dose is Coumadin 5 mg tabs: Take as directed by coumadin clinic..  The target INR is 2.5 - 3.5.  The next INR is due 03/04/2009.  Anticoagulation instructions were given to patient.  Results were reviewed/authorized by Bethena Midget, RN, BSN.  He was notified by Bethena Midget, RN, BSN.         Prior Anticoagulation Instructions: INR 3.3  Continue on same dosage 7.5mg  daily except 5mg  on Mondays, Wednesdays, and Fridays.  Recheck in 4 weeks.    Current Anticoagulation Instructions: INR 2.9 Continue 7.5mg s daily except 5mg s on Mondays, Wednesdays and Fridays. Recheck in 4 weeks.

## 2010-02-16 NOTE — Medication Information (Signed)
Summary: rov/tm  Anticoagulant Therapy  Managed by: Leota Sauers, PharmD, BCPS, CPP Referring MD: Shawnie Pons MD PCP: Jacques Navy MD Supervising MD: Cassell Clement MD Indication 1: Aortic Valve Replacement (ICD-V43.3) Indication 2: Hyperlipidemia (ICD-Incr L) Lab Used: [facility] Site: [facility] INR POC 2.7 INR RANGE 2.5 - 3.5  Dietary changes: no    Health status changes: no    Bleeding/hemorrhagic complications: no    Recent/future hospitalizations: no    Any changes in medication regimen? no    Recent/future dental: no  Any missed doses?: no       Is patient compliant with meds? yes       Current Medications (verified): 1)  Coumadin 5 Mg Tabs (Warfarin Sodium) .... Take As Directed By Coumadin Clinic. 2)  Lipitor 40 Mg Tabs (Atorvastatin Calcium) .... 1/2 Tab Once Daily 3)  Triamterene-Hctz 37.5-25 Mg  Tabs (Triamterene-Hctz) .... Once Daily 4)  Zetia 10 Mg  Tabs (Ezetimibe) .... Once Daily 5)  Tylenol 325 Mg  Tabs (Acetaminophen) .... As Needed 6)  Miralax  Powd (Polyethylene Glycol 3350) .... As Needed 7)  Metamucil 30.9 % Powd (Psyllium) .... As Needed  Allergies: 1)  ! Codeine  Anticoagulation Management History:      The patient is taking warfarin and comes in today for a routine follow up visit.  Positive risk factors for bleeding include an age of 75 years or older.  The bleeding index is 'intermediate risk'.  Positive CHADS2 values include History of HTN and Age > 10 years old.  The start date was 05/02/1997.  Anticoagulation responsible provider: Cassell Clement MD.  INR POC: 2.7.  Cuvette Lot#: [id number].  Exp: 11/2010.    Anticoagulation Management Assessment/Plan:      The patient's current anticoagulation dose is Coumadin 5 mg tabs: Take as directed by coumadin clinic..  The target INR is 2.5 - 3.5.  The next INR is due 12/23/2009.  Anticoagulation instructions were given to patient.  Results were reviewed/authorized by Leota Sauers,  PharmD, BCPS, CPP.         Prior Anticoagulation Instructions: INR 2.4 Today take 7.5mg s then resume 7.5mg s daily except 5mg s on Mondays, Wednesdays and Fridays. Recheck in 3 weeks.   Current Anticoagulation Instructions: INR 2.7  Coumadin 5mg  tab, take 1 tab Mon, Wed, Fri and 1.5tab on Tue, Thur, Sat and Sun

## 2010-02-16 NOTE — Medication Information (Signed)
Summary: rov/ewj  Anticoagulant Therapy  Managed by: Cloyde Reams, RN, BSN Referring MD: Shawnie Pons MD PCP: Jacques Navy MD Supervising MD: Jens Som MD, Arlys John Indication 1: Aortic Valve Replacement (ICD-V43.3) Indication 2: Hyperlipidemia (ICD-Incr L) Lab Used: [facility] Site: [facility] INR POC 2.5 INR RANGE 2.5 - 3.5  Dietary changes: no    Health status changes: no    Bleeding/hemorrhagic complications: no    Recent/future hospitalizations: no    Any changes in medication regimen? no    Recent/future dental: no  Any missed doses?: no       Is patient compliant with meds? yes       Allergies (verified): 1)  ! Codeine  Anticoagulation Management History:      The patient is taking warfarin and comes in today for a routine follow up visit.  Positive risk factors for bleeding include an age of 75 years or older.  The bleeding index is 'intermediate risk'.  Positive CHADS2 values include History of HTN and Age > 57 years old.  The start date was 05/02/1997.  Anticoagulation responsible provider: Jens Som MD, Arlys John.  INR POC: 2.5.  Cuvette Lot#: [id number].  Exp: 05/2010.    Anticoagulation Management Assessment/Plan:      The patient's current anticoagulation dose is Coumadin 5 mg tabs: Take as directed by coumadin clinic..  The target INR is 2.5 - 3.5.  The next INR is due 04/29/2009.  Anticoagulation instructions were given to patient.  Results were reviewed/authorized by Cloyde Reams, RN, BSN.  He was notified by Cloyde Reams RN.         Prior Anticoagulation Instructions: INR 2.5  Continue on same dosage 7.5mg  daily except 5mg  on Mondays, Wednesdays, and Fridays.  Recheck in 4 weeks.    Current Anticoagulation Instructions: Same as Prior Instructions.

## 2010-02-16 NOTE — Medication Information (Signed)
Summary: rov/tm  Anticoagulant Therapy  Managed by: Cloyde Reams, RN, BSN Referring MD: Shawnie Pons MD PCP: Jacques Navy MD Supervising MD: Shirlee Latch MD, Karsyn Rochin Indication 1: Aortic Valve Replacement (ICD-V43.3) Indication 2: Hyperlipidemia (ICD-Incr L) Lab Used: [facility] Site: [facility] INR POC 2.5 INR RANGE 2.5 - 3.5  Dietary changes: no    Health status changes: no    Bleeding/hemorrhagic complications: no    Recent/future hospitalizations: no    Any changes in medication regimen? no    Recent/future dental: no  Any missed doses?: no       Is patient compliant with meds? yes       Allergies (verified): 1)  ! Codeine  Anticoagulation Management History:      The patient is taking warfarin and comes in today for a routine follow up visit.  Positive risk factors for bleeding include an age of 75 years or older.  The bleeding index is 'intermediate risk'.  Positive CHADS2 values include History of HTN and Age > 31 years old.  The start date was 05/02/1997.  Anticoagulation responsible provider: Shirlee Latch MD, Pattie Flaharty.  INR POC: 2.5.  Cuvette Lot#: [id number].  Exp: 04/2010.    Anticoagulation Management Assessment/Plan:      The patient's current anticoagulation dose is Coumadin 5 mg tabs: Take as directed by coumadin clinic..  The target INR is 2.5 - 3.5.  The next INR is due 04/01/2009.  Anticoagulation instructions were given to patient.  Results were reviewed/authorized by Cloyde Reams, RN, BSN.  He was notified by Cloyde Reams RN.         Prior Anticoagulation Instructions: INR 2.9 Continue 7.5mg s daily except 5mg s on Mondays, Wednesdays and Fridays. Recheck in 4 weeks.   Current Anticoagulation Instructions: INR 2.5  Continue on same dosage 7.5mg  daily except 5mg  on Mondays, Wednesdays, and Fridays.  Recheck in 4 weeks.

## 2010-02-16 NOTE — Medication Information (Signed)
Summary: rov/ewj  Anticoagulant Therapy  Managed by: Reina Fuse, PharmD Referring MD: Shawnie Pons MD PCP: Jacques Navy MD Supervising MD: Eden Emms MD, Theron Arista Indication 1: Aortic Valve Replacement (ICD-V43.3) Indication 2: Hyperlipidemia (ICD-Incr L) Lab Used: [facility] Site: [facility] INR POC 2.8 INR RANGE 2.5 - 3.5  Dietary changes: no    Health status changes: no    Bleeding/hemorrhagic complications: no    Recent/future hospitalizations: no    Any changes in medication regimen? no    Recent/future dental: no  Any missed doses?: no       Is patient compliant with meds? yes       Current Medications (verified): 1)  Coumadin 5 Mg Tabs (Warfarin Sodium) .... Take As Directed By Coumadin Clinic. 2)  Lipitor 40 Mg Tabs (Atorvastatin Calcium) .... 1/2 Tab Once Daily 3)  Triamterene-Hctz 37.5-25 Mg  Tabs (Triamterene-Hctz) .... Once Daily 4)  Zetia 10 Mg  Tabs (Ezetimibe) .... Once Daily 5)  Tylenol 325 Mg  Tabs (Acetaminophen) .... As Needed 6)  Miralax  Powd (Polyethylene Glycol 3350) .... As Needed 7)  Metamucil 30.9 % Powd (Psyllium) .... As Needed  Allergies (verified): 1)  ! Codeine  Anticoagulation Management History:      The patient is taking warfarin and comes in today for a routine follow up visit.  Positive risk factors for bleeding include an age of 75 years or older.  The bleeding index is 'intermediate risk'.  Positive CHADS2 values include History of HTN and Age > 75 years old.  The start date was 05/02/1997.  Anticoagulation responsible provider: Eden Emms MD, Theron Arista.  INR POC: 2.8.  Cuvette Lot#: [id number].  Exp: 09/2010.    Anticoagulation Management Assessment/Plan:      The patient's current anticoagulation dose is Coumadin 5 mg tabs: Take as directed by coumadin clinic..  The target INR is 2.5 - 3.5.  The next INR is due 09/16/2009.  Anticoagulation instructions were given to patient.  Results were reviewed/authorized by Reina Fuse, PharmD.  He was  notified by Reina Fuse PharmD.         Prior Anticoagulation Instructions: INR 2.9  Continue on same dosage 7.5mg  daily except 5mg  on Mondays, Wednesdays, and Fridays.  Recheck in 4 weeks.    Current Anticoagulation Instructions: INR 2.8  Continue Coumadin 1.5 tablets (7.5 mg) on Sun, Tues, Thur, Sat and Coumadin 1 tablet (5 mg) on Mon, Wed, Fri. Return to clinic in 4 weeks.

## 2010-02-16 NOTE — Assessment & Plan Note (Signed)
Summary: Yearly f/u / [employer] medicare / will pay for labs/cd   Vital Signs:  Patient profile:   76 year old male Height:      70 inches Weight:      153 pounds BMI:     22.03 O2 Sat:      99 % on Room air Temp:     97.8 degrees F oral Pulse rate:   65 / minute BP sitting:   100 / 60  (left arm) Cuff size:   regular  Vitals Entered By: Ami Bullins CMA (November 26, 2009 10:00 AM)  O2 Flow:  Room air CC: yearly/ ab  Vision Screening:      Vision Comments: Normal eye exam done 08/2009 with Dr Emily Filbert   Primary Care Provider:  Jacques Navy MD  CC:  yearly/ ab.  History of Present Illness: Patient presents for annual medicare wellness exam.   He is feeling good most of the time but does have a little constipation. He is taking miralax several days a week. He does have pain with defecation.  He is independenet in all ADLs. He is managing the household finances - writes checks and balances the checkbook. No signs of depression. No falls.   Preventive Screening-Counseling & Management  Alcohol-Tobacco     Alcohol drinks/day: 1     Alcohol type: wine     Smoking Status: quit     Year Quit: 1974  Caffeine-Diet-Exercise     Caffeine use/day: 1 cup daily     Diet Comments: pretty healthy diet     Does Patient Exercise: yes     Type of exercise: walking      Exercise (avg: min/session): 30-60     Times/week: 6  Hep-HIV-STD-Contraception     Hepatitis Risk: no risk noted     HIV Risk Counseling: not indicated-no HIV risk noted     STD Risk Counseling: not indicated-no STD risk noted     Dental Visit-last 6 months yes     Sun Exposure-Excessive: no  Safety-Violence-Falls     Seat Belt Use: yes     Helmet Use: n/a     Firearms in the Home: no firearms in the home     Smoke Detectors: yes     Violence in the Home: no risk noted     Sexual Abuse: no     Fall Risk: low fall risk      Sexual History:  currently monogamous.        Drug Use:  never.        Blood  Transfusions:  yes and after 1987.    Current Medications (verified): 1)  Coumadin 5 Mg Tabs (Warfarin Sodium) .... Take As Directed By Coumadin Clinic. 2)  Lipitor 40 Mg Tabs (Atorvastatin Calcium) .... 1/2 Tab Once Daily 3)  Triamterene-Hctz 37.5-25 Mg  Tabs (Triamterene-Hctz) .... Once Daily 4)  Zetia 10 Mg  Tabs (Ezetimibe) .... Once Daily 5)  Tylenol 325 Mg  Tabs (Acetaminophen) .... As Needed 6)  Miralax  Powd (Polyethylene Glycol 3350) .... As Needed 7)  Metamucil 30.9 % Powd (Psyllium) .... As Needed  Allergies (verified): 1)  ! Codeine  Past History:  Family History: Last updated: 2008/10/11 father - deceased @61 : CAD/MI-fatal, had 1st at 22; HTN mother- deceased @ 2: CAD/MI-fatal, DM Neg- colon cancer  P Uncle Prostate Cancer Brother - DM  Past Medical History: Hx of SHINGLES (ICD-053.9) THORACIC ANEURYSM WITHOUT MENTION OF RUPTURE (ICD-441.2) AORTIC VALVE REPLACEMENT, HX OF (  ICD-V43.3) AORTIC STENOSIS, SEVERE (ICD-424.1) UNSPECIFIED ESSENTIAL HYPERTENSION (ICD-401.9) HYPERLIPIDEMIA (ICD-272.4) PERSONAL HX COLONIC POLYPS (ICD-V12.72) HEMORRHOIDS-EXTERNAL (ICD-455.3) CONSTIPATION, CHRONIC (ICD-564.09) NEOPLASM, MALIGNANT, PROSTATE, HX OF (ICD-V10.46) MENIERE'S DISEASE, HX OF (ICD-V12.49)  Physician Roster;                GU -- Dr.Wrenn                GI-  Dr. Marina Goodell                Card - Dr. Riley Kill                CVTS - Dr. Tyrone Sage    Past Surgical History: Reviewed history from 09/20/2008 and no changes required. Aortic Valve Replacement: '91 [employer] valve Cholecystectomy Hemorrhoidectomy Prostatectomy-radical '95 Tonsillectomy  Social History: Dental Care w/in 6 mos.:  yes Sun Exposure-Excessive:  no Seat Belt Use:  yes Fall Risk:  low fall risk Caffeine use/day:  1 cup daily Hepatitis Risk:  no risk noted Sexual History:  currently monogamous Drug Use:  never Blood Transfusions:  yes, after 1987  Review of Systems       The patient  complains of decreased hearing.  The patient denies anorexia, fever, weight loss, weight gain, vision loss, hoarseness, chest pain, syncope, dyspnea on exertion, peripheral edema, prolonged cough, abdominal pain, severe indigestion/heartburn, incontinence, muscle weakness, difficulty walking, depression, abnormal bleeding, enlarged lymph nodes, angioedema, and testicular masses.         loss of hearing left ear Nocturia x 2  Physical Exam  General:  Thin elderly white male in no distress Head:  normocephalic, atraumatic, and no abnormalities observed.   Eyes:  vision grossly intact, pupils equal, pupils round, corneas and lenses clear, no injection, and no iris abnormalities.   Ears:  R ear normal, L ear normal, and no external deformities.   Nose:  no external deformity and no external erythema.   Mouth:  Oral mucosa and oropharynx without lesions or exudates.  Teeth in good repair. Neck:  supple, full ROM, no thyromegaly, and no JVD.   Chest Wall:  well healed sternotomy scar. Lungs:  Normal respiratory effort, chest expands symmetrically. Lungs are clear to auscultation, no crackles or wheezes. Heart:  normal rate and regular rhythm.  Noted several pauses but greater than 20 beats between. Mechanical valve click best at RSB. Radiated sound to right carotid but no carotid bruits.  Abdomen:  soft, non-tender, normal bowel sounds, no masses, no guarding, no rigidity, and no hepatomegaly.   Rectal:  Increase rectal tone. Two external hermorrhoids, not inflammed. No internal hemorrhoids appreciated.  Prostate:  s/p prostatectomy Msk:  normal ROM, no joint tenderness, no joint swelling, and no joint warmth.  Interosseous wasting at  bse of 1 st MCP. Normal joints otherwise.  Pulses:  2+ radail and PT Extremities:  No clubbing, cyanosis, edema, or deformity noted with normal full range of motion of all joints.   Neurologic:  alert & oriented X3, cranial nerves II-XII intact, strength normal in all  extremities, gait normal, and DTRs symmetrical and normal.  Normal tandem gait.  Skin:  turgor normal, color normal, no rashes, no ecchymoses, no petechiae, and no ulcerations.   Cervical Nodes:  no anterior cervical adenopathy and no posterior cervical adenopathy.   Axillary Nodes:  no R axillary adenopathy and no L axillary adenopathy.   Psych:  Oriented X3, memory intact for recent and remote, normally interactive, good eye contact, and not anxious appearing.  Impression & Recommendations:  Problem # 1:  UNSPECIFIED ESSENTIAL HYPERTENSION (ICD-401.9)  His updated medication list for this problem includes:    Triamterene-hctz 37.5-25 Mg Tabs (Triamterene-hctz) ..... Once daily  BP today: 100/60 Prior BP: 126/80 (08/10/2009)  Labs Reviewed: K+: 4.1 (11/20/2009) Creat: : 1.0 (11/20/2009)    Very good control with normal labs  Plan continue present medications.  Problem # 2:  HYPERLIPIDEMIA (ICD-272.4)  His updated medication list for this problem includes:    Lipitor 40 Mg Tabs (Atorvastatin calcium) .Marland Kitchen... 1/2 tab once daily    Zetia 10 Mg Tabs (Ezetimibe) ..... Once daily  LDL 71 - excellent control  Plan - continue present meds  Problem # 3:  CONSTIPATION, CHRONIC (ICD-564.09) Continued intermittent problem with constipation despite miralax. Painful BMs may be due to increased rectal sphincter tone. No mass or abnormality noted.  Plan - trial of MOM 15 cc at bedtime.           routine GI follow-up.  His updated medication list for this problem includes:    Miralax Powd (Polyethylene glycol 3350) .Marland Kitchen... As needed    Metamucil 30.9 % Powd (Psyllium) .Marland Kitchen... As needed  Problem # 4:  NEOPLASM, MALIGNANT, PROSTATE, HX OF (ICD-V10.46) Doing well. PSA is very low.  Problem # 5:  THORACIC ANEURYSM WITHOUT MENTION OF RUPTURE (ICD-441.2) Records reviewed: last visit with Dr. Tyrone Sage Nov '10 - CT abdomen Oct 22nd,'10 revealed AAA less than 5 cm. He is to have follow up in  April 2012.  Problem # 6:  Preventive Health Care (ICD-V70.0) Interval history is benign and he is current with cardiology follow-up. His exam is unremarkable. Lab results are within normal limits. He is current with colorectal cancer screening with last colonoscopy May '09. Immunizations: Tetnus Jan '05, pneumovax Jan '05. He is current with flu. He has had shingles - no strong indication for shingles vaccine.  Per HPI he has no evidence of depression, manages all ADLs, is cognitively intact and highly functional and has no fall risk.  In summar - this is a very nice gentleman who appears to be medically stable. He will follow-up with Drs Riley Kill and Tyrone Sage as instructed. He will return as needed or 1 year.   Complete Medication List: 1)  Coumadin 5 Mg Tabs (Warfarin sodium) .... Take as directed by coumadin clinic. 2)  Lipitor 40 Mg Tabs (Atorvastatin calcium) .... 1/2 tab once daily 3)  Triamterene-hctz 37.5-25 Mg Tabs (Triamterene-hctz) .... Once daily 4)  Zetia 10 Mg Tabs (Ezetimibe) .... Once daily 5)  Tylenol 325 Mg Tabs (Acetaminophen) .... As needed 6)  Miralax Powd (Polyethylene glycol 3350) .... As needed 7)  Metamucil 30.9 % Powd (Psyllium) .... As needed  Other Orders: Medicare -1st Annual Wellness Visit [phone])   Atsushi Otting Note: All result statuses are Final unless otherwise noted.  Tests: (1) BMP (METABOL)   Sodium                    141 mEq/L                   135-145   Potassium                 4.1 mEq/L                   3.5-5.1   Chloride                  105 mEq/L  96-112   Carbon Dioxide            31 mEq/L                    19-32   Glucose                   94 mg/dL                    09-60   BUN                       20 mg/dL                    4-54   Creatinine                1.0 mg/dL                   0.9-8.1   Calcium                   9.3 mg/dL                   1.9-14.7   GFR                       73.35 mL/min                 >60  Tests: (2) CBC Platelet w/Diff (CBCD)   White Cell Count     [L]  4.3 K/uL                    4.5-10.5   Red Cell Count            4.50 Mil/uL                 4.22-5.81   Hemoglobin                14.4 g/dL                   82.9-56.2   Hematocrit                41.2 %                      39.0-52.0   MCV                       91.7 fl                     78.0-100.0   MCHC                      34.8 g/dL                   13.0-86.5   RDW                       13.2 %                      11.5-14.6   Platelet Count            215.0 K/uL                  150.0-400.0   Neutrophil %  55.8 %                      43.0-77.0   Lymphocyte %              28.2 %                      12.0-46.0   Monocyte %                11.9 %                      3.0-12.0   Eosinophils%              3.3 %                       0.0-5.0   Basophils %               0.8 %                       0.0-3.0   Neutrophill Absolute      2.4 K/uL                    1.4-7.7   Lymphocyte Absolute       1.2 K/uL                    0.7-4.0   Monocyte Absolute         0.5 K/uL                    0.1-1.0  Eosinophils, Absolute                             0.1 K/uL                    0.0-0.7   Basophils Absolute        0.0 K/uL                    0.0-0.1  Tests: (3) Hepatic/Liver Function Panel (HEPATIC)   Total Bilirubin      [H]  1.9 mg/dL                   0.2-7.2   Direct Bilirubin          0.3 mg/dL                   5.3-6.6   Alkaline Phosphatase      84 U/L                      39-117   AST                       26 U/L                      0-37   ALT                       24 U/L                      0-53   Total Protein             6.4 g/dL  6.0-8.3   Albumin                   4.1 g/dL                    1.6-1.0  Tests: (4) TSH (TSH)   FastTSH                   3.80 uIU/mL                 0.35-5.50  Tests: (5) Lipid Panel (LIPID)   Cholesterol               124 mg/dL                    9-604     ATP III Classification            Desirable:  < 200 mg/dL                    Borderline High:  200 - 239 mg/dL               High:  > = 240 mg/dL   Triglycerides             59.0 mg/dL                  5.4-098.1     Normal:  <150 mg/dL     Borderline High:  191 - 199 mg/dL   HDL                       47.82 mg/dL                 >95.62   VLDL Cholesterol          11.8 mg/dL                  1.3-08.6   LDL Cholesterol           71 mg/dL                    5-78  CHO/HDL Ratio:  CHD Risk                             3                    Men          Women     1/2 Average Risk     3.4          3.3     Average Risk          5.0          4.4     2X Average Risk          9.6          7.1     3X Average Risk          15.0          11.0                           Tests: (6) UDip Only (UDIP)   Color                     YELLOW       RANGE:  Yellow;Lt. Yellow   Clarity                   CLEAR                       Clear   Specific Gravity          1.010                       1.000 - 1.030   Urine Ph                  7.0                         5.0-8.0   Protein                   NEGATIVE                    Negative   Urine Glucose             NEGATIVE                    Negative   Ketones                   NEGATIVE                    Negative   Urine Bilirubin           NEGATIVE                    Negative   Blood                     NEGATIVE                    Negative   Urobilinogen              1.0                         0.0 - 1.0   Leukocyte Esterace        NEGATIVE                    Negative   Nitrite                   NEGATIVE                    Negative  Tests: (7) Prostate Specific Antigen (PSA)   PSA-Hyb              [L]  0.01 ng/mL                  0.10-4.00  Orders Added: 1)  Medicare -1st Annual Wellness Visit [G0438] 2)  Est. Patient Level III [34742]

## 2010-02-16 NOTE — Assessment & Plan Note (Signed)
Summary: ROV   Visit Type:  rov Referring Provider:  n/a Primary Provider:  Jacques Navy MD  CC:  no cardiac complaints today.  History of Present Illness: Doing well overall.  He is playing golf, and volunteering. He has been on a trip to the [place].  Walked the whole time he was working.  He has now walked in all fifty states.  No chest pain, or shortness of breath.  He is very active, overall, and sometimes the heat bothers him.    Current Medications (verified): 1)  Coumadin 5 Mg Tabs (Warfarin Sodium) .... Take As Directed By Coumadin Clinic. 2)  Lipitor 40 Mg Tabs (Atorvastatin Calcium) .... 1/2 Tab Once Daily 3)  Triamterene-Hctz 37.5-25 Mg  Tabs (Triamterene-Hctz) .... Once Daily 4)  Zetia 10 Mg  Tabs (Ezetimibe) .... Once Daily 5)  Tylenol 325 Mg  Tabs (Acetaminophen) .... As Needed 6)  Miralax  Powd (Polyethylene Glycol 3350) .... As Needed 7)  Metamucil 30.9 % Powd (Psyllium) .... As Needed  Allergies: 1)  ! Codeine  Past History:  Past Medical History: Last updated: 09/20/2008 Current Problems:  AORTIC STENOSIS, SEVERE (ICD-424.1) UNSPECIFIED ESSENTIAL HYPERTENSION (ICD-401.9) HYPERLIPIDEMIA (ICD-272.4) PERSONAL HX COLONIC POLYPS (ICD-V12.72) CONSTIPATION (ICD-564.00) HEMORRHOIDS-EXTERNAL (ICD-455.3) RECTAL PAIN (ICD-569.42) CONSTIPATION, CHRONIC (ICD-564.09) ROUTINE GENERAL MEDICAL EXAM@HEALTH  CARE FACL (ICD-V70.0) NEOPLASM, MALIGNANT, PROSTATE, HX OF (ICD-V10.46) MENIERE'S DISEASE, HX OF (ICD-V12.49)  Vital Signs:  Patient profile:   77 year old male Height:      70 inches Weight:      155 pounds BMI:     22.32 Pulse rate:   61 / minute Pulse rhythm:   irregular BP sitting:   126 / 80  (left arm) Cuff size:   large  Vitals Entered By: Danielle Rankin, CMA (August 10, 2009 10:43 AM)  Physical Exam  General:  Well developed, well nourished, in no acute distress. Head:  normocephalic and atraumatic Eyes:  PERRLA/EOM intact; conjunctiva and lids  normal. Lungs:  Clear bilaterally to auscultation and percussion. Heart:  Normal S1.  Crisp VS.  No diastolic murmur. Abdomen:  Bowel sounds positive; abdomen soft and non-tender without masses, organomegaly, or hernias noted. No hepatosplenomegaly. Extremities:  No clubbing or cyanosis. Neurologic:  Alert and oriented x 3.   EKG  Procedure date:  08/10/2009  Findings:      NSR with first degree.  Leftward axis.  Delay R wave progression.  Impression & Recommendations:  Problem # 1:  THORACIC ANEURYSM WITHOUT MENTION OF RUPTURE (ICD-441.2)  Schedule for followup with Dr. Tyrone Sage in 18 months total, or April 2012  Orders: EKG w/ Interpretation (93000)  Problem # 2:  AORTIC VALVE REPLACEMENT, HX OF (ICD-V43.3)  Valve sounds crisp.  Continue warfarin.  Orders: EKG w/ Interpretation (93000)  Problem # 3:  HYPERLIPIDEMIA (ICD-272.4)  at target and followed by Dr. Debby Bud.   His updated medication list for this problem includes:    Lipitor 40 Mg Tabs (Atorvastatin calcium) .Marland Kitchen... 1/2 tab once daily    Zetia 10 Mg Tabs (Ezetimibe) ..... Once daily  His updated medication list for this problem includes:    Lipitor 40 Mg Tabs (Atorvastatin calcium) .Marland Kitchen... 1/2 tab once daily    Zetia 10 Mg Tabs (Ezetimibe) ..... Once daily  Problem # 4:  UNSPECIFIED ESSENTIAL HYPERTENSION (ICD-401.9)  stable His updated medication list for this problem includes:    Triamterene-hctz 37.5-25 Mg Tabs (Triamterene-hctz) ..... Once daily  His updated medication list for this problem includes:  Triamterene-hctz 37.5-25 Mg Tabs (Triamterene-hctz) ..... Once daily  Patient Instructions: 1)  Your physician recommends that you continue on your current medications as directed. Please refer to the Current Medication list given to you today. 2)  Your physician wants you to follow-up in:  April 2012 (Office Visit and CT scan).  You will receive a reminder letter in the mail two months in advance. If you  don't receive a letter, please call our office to schedule the follow-up appointment.

## 2010-02-16 NOTE — Medication Information (Signed)
Summary: rov/eac  Anticoagulant Therapy  Managed by: Bethena Midget, RN, BSN Referring MD: Shawnie Pons MD PCP: Jacques Navy MD Supervising MD: Myrtis Ser MD, Tinnie Gens Indication 1: Aortic Valve Replacement (ICD-V43.3) Indication 2: Hyperlipidemia (ICD-Incr L) Lab Used: [facility] Site: [facility] INR POC 2.9 INR RANGE 2.5 - 3.5  Dietary changes: no    Health status changes: no    Bleeding/hemorrhagic complications: no    Recent/future hospitalizations: no    Any changes in medication regimen? no    Recent/future dental: no  Any missed doses?: no       Is patient compliant with meds? yes       Allergies: 1)  ! Codeine  Anticoagulation Management History:      The patient is taking warfarin and comes in today for a routine follow up visit.  Positive risk factors for bleeding include an age of 75 years or older.  The bleeding index is 'intermediate risk'.  Positive CHADS2 values include History of HTN and Age > 75 years old.  The start date was 05/02/1997.  Anticoagulation responsible provider: Myrtis Ser MD, Tinnie Gens.  INR POC: 2.9.  Cuvette Lot#: [id number].  Exp: 08/2010.    Anticoagulation Management Assessment/Plan:      The patient's current anticoagulation dose is Coumadin 5 mg tabs: Take as directed by coumadin clinic..  The target INR is 2.5 - 3.5.  The next INR is due 07/22/2009.  Anticoagulation instructions were given to patient.  Results were reviewed/authorized by Bethena Midget, RN, BSN.  He was notified by Bethena Midget, RN, BSN.         Prior Anticoagulation Instructions: INR 2.7  Continue current dosing schedule of 1 tablet (5 mg) on Monday, Wednesday, and Friday and 1.5 tablets (7.5 mg) all other days.  Return to clinic in 4 weeks.   Current Anticoagulation Instructions: INR 2.9 Continue 7.5mg  everyday except 5mg  on Mondays, Wednesdays and Fridays. Recheck in 4 weeks.

## 2010-02-16 NOTE — Medication Information (Signed)
Summary: rov/sl  Anticoagulant Therapy  Managed by: Reina Fuse, PharmD Referring MD: Shawnie Pons MD PCP: Jacques Navy MD Supervising MD: Eden Emms MD, Theron Arista Indication 1: Aortic Valve Replacement (ICD-V43.3) Indication 2: Hyperlipidemia (ICD-Incr L) Lab Used: [facility] Site: [facility] INR POC 2.5 INR RANGE 2.5 - 3.5  Dietary changes: no    Health status changes: no    Bleeding/hemorrhagic complications: no    Recent/future hospitalizations: no    Any changes in medication regimen? no    Recent/future dental: no  Any missed doses?: no       Is patient compliant with meds? yes       Allergies: 1)  ! Codeine  Anticoagulation Management History:      The patient is taking warfarin and comes in today for a routine follow up visit.  Positive risk factors for bleeding include an age of 75 years or older.  The bleeding index is 'intermediate risk'.  Positive CHADS2 values include History of HTN and Age > 75 years old.  The start date was 05/02/1997.  Anticoagulation responsible provider: Eden Emms MD, Theron Arista.  INR POC: 2.5.  Cuvette Lot#: [id number].  Exp: 09/2010.    Anticoagulation Management Assessment/Plan:      The patient's current anticoagulation dose is Coumadin 5 mg tabs: Take as directed by coumadin clinic..  The target INR is 2.5 - 3.5.  The next INR is due 10/14/2009.  Anticoagulation instructions were given to patient.  Results were reviewed/authorized by Reina Fuse, PharmD.  He was notified by Reina Fuse PharmD.         Prior Anticoagulation Instructions: INR 2.8  Continue Coumadin 1.5 tablets (7.5 mg) on Sun, Tues, Thur, Sat and Coumadin 1 tablet (5 mg) on Mon, Wed, Fri. Return to clinic in 4 weeks.  Current Anticoagulation Instructions: INR 2.5  Continue taking Coumadin 1.5 tabs (7.5 mg) on Sun, Tues, Thur, Sat and Coumadin 1 tab (5 mg) on Mon, Wed, Fri. Return to clinic in 4 weeks.

## 2010-02-17 ENCOUNTER — Encounter (INDEPENDENT_AMBULATORY_CARE_PROVIDER_SITE_OTHER): Payer: MEDICARE

## 2010-02-17 ENCOUNTER — Ambulatory Visit: Admit: 2010-02-17 | Payer: Self-pay

## 2010-02-17 ENCOUNTER — Encounter: Payer: Self-pay | Admitting: Cardiovascular Disease

## 2010-02-17 DIAGNOSIS — Z7901 Long term (current) use of anticoagulants: Secondary | ICD-10-CM

## 2010-02-18 NOTE — Medication Information (Signed)
Summary: rov/sp  Anticoagulant Therapy  Managed by: Louann Sjogren, PharmD Referring MD: Shawnie Pons MD PCP: Jacques Navy MD Supervising MD: Cassell Clement MD Indication 1: Aortic Valve Replacement (ICD-V43.3) Indication 2: Hyperlipidemia (ICD-Incr L) Lab Used: [facility] Site: [facility] INR POC 2.1 INR RANGE 2.5 - 3.5  Dietary changes: no    Health status changes: no    Bleeding/hemorrhagic complications: no    Recent/future hospitalizations: no    Any changes in medication regimen? yes       Details: Started taking milk of mag  Recent/future dental: no  Any missed doses?: no       Is patient compliant with meds? yes       Allergies: 1)  ! Codeine  Anticoagulation Management History:      The patient is taking warfarin and comes in today for a routine follow up visit.  Positive risk factors for bleeding include an age of 75 years or older.  The bleeding index is 'intermediate risk'.  Positive CHADS2 values include History of HTN and Age > 35 years old.  The start date was 05/02/1997.  Today's INR is 2.1.  Anticoagulation responsible provider: Cassell Clement MD.  INR POC: 2.1.  Cuvette Lot#: [id number].  Exp: 10/2010.    Anticoagulation Management Assessment/Plan:      The patient's current anticoagulation dose is Coumadin 5 mg tabs: Take as directed by coumadin clinic..  The target INR is 2.5 - 3.5.  The next INR is due 02/03/2010.  Anticoagulation instructions were given to patient.  Results were reviewed/authorized by Louann Sjogren, PharmD.         Prior Anticoagulation Instructions: INR 2.8  Continue same dose of 1 1/2 tablets every day except 1 tablet on Monday, Wednesday and Friday.  Recheck INR in 4 weeks.   Current Anticoagulation Instructions: INR 2.1 (goal 2.5-3.5)  Take 2 tablets today and then return to normal schedule of 1 1/2 tablets everyday except take 1 tablet on Mondays, Wednesdays, and Fridays.  Return to clinic in 2 weeks for  INR check on Wednesday, January 18th on 8:30AM.

## 2010-02-18 NOTE — Medication Information (Signed)
Summary: rov  Anticoagulant Therapy  Managed by: Leota Sauers, PharmD, BCPS, CPP Referring MD: Shawnie Pons MD PCP: Jacques Navy MD Supervising MD: Shirlee Latch MD, Terry Bolotin Indication 1: Aortic Valve Replacement (ICD-V43.3) Indication 2: Hyperlipidemia (ICD-Incr L) Lab Used: [facility] Site: [facility] INR RANGE 2.5 - 3.5  Dietary changes: yes       Details: less greens  Health status changes: no    Bleeding/hemorrhagic complications: no    Recent/future hospitalizations: no    Any changes in medication regimen? no    Recent/future dental: no  Any missed doses?: no       Is patient compliant with meds? yes       Current Medications (verified): 1)  Coumadin 5 Mg Tabs (Warfarin Sodium) .... Take As Directed By Coumadin Clinic. 2)  Lipitor 40 Mg Tabs (Atorvastatin Calcium) .... 1/2 Tab Once Daily 3)  Triamterene-Hctz 37.5-25 Mg  Tabs (Triamterene-Hctz) .... Once Daily 4)  Zetia 10 Mg  Tabs (Ezetimibe) .... Once Daily 5)  Tylenol 325 Mg  Tabs (Acetaminophen) .... As Needed 6)  Miralax  Powd (Polyethylene Glycol 3350) .... As Needed 7)  Metamucil 30.9 % Powd (Psyllium) .... As Needed  Allergies (verified): 1)  ! Codeine  Anticoagulation Management History:      The patient is taking warfarin and comes in today for a routine follow up visit.  Positive risk factors for bleeding include an age of 32 years or older.  The bleeding index is 'intermediate risk'.  Positive CHADS2 values include History of HTN and Age > 63 years old.  The start date was 05/02/1997.  His last INR was 2.1.  Anticoagulation responsible provider: Shirlee Latch MD, Dala Breault.  Cuvette Lot#: [id number].  Exp: 10/2010.    Anticoagulation Management Assessment/Plan:      The patient's current anticoagulation dose is Coumadin 5 mg tabs: Take as directed by coumadin clinic..  The target INR is 2.5 - 3.5.  The next INR is due 02/17/2010.  Anticoagulation instructions were given to patient.  Results were reviewed/authorized  by Leota Sauers, PharmD, BCPS, CPP.         Prior Anticoagulation Instructions: INR 2.1 (goal 2.5-3.5)  Take 2 tablets today and then return to normal schedule of 1 1/2 tablets everyday except take 1 tablet on Mondays, Wednesdays, and Fridays.  Return to clinic in 2 weeks for INR check on Wednesday, January 18th on 8:30AM.  Current Anticoagulation Instructions: INR 1.9  Eat 2-3 servings of green vegies per week  Couamdin 5mg  tabs Take 2 tabs today WED 1/18 then 1.5 tab each day

## 2010-02-24 NOTE — Medication Information (Signed)
Summary: David Benton  Anticoagulant Therapy  Managed by: Tammy Sours, PharmD Referring MD: Shawnie Pons MD PCP: Jacques Navy MD Supervising MD: Eden Emms MD, Theron Arista Indication 1: Aortic Valve Replacement (ICD-V43.3) Indication 2: Hyperlipidemia (ICD-Incr L) Lab Used: [facility] Site: [facility] INR POC 3.9 INR RANGE 2.5 - 3.5  Dietary changes: no    Health status changes: no    Bleeding/hemorrhagic complications: no    Recent/future hospitalizations: no    Any changes in medication regimen? no    Recent/future dental: no  Any missed doses?: no       Is patient compliant with meds? yes       Allergies: 1)  ! Codeine  Anticoagulation Management History:      The patient is taking warfarin and comes in today for a routine follow up visit.  Positive risk factors for bleeding include an age of 75 years or older.  The bleeding index is 'intermediate risk'.  Positive CHADS2 values include History of HTN and Age > 64 years old.  The start date was 05/02/1997.  His last INR was 2.1.  Anticoagulation responsible provider: Eden Emms MD, Theron Arista.  INR POC: 3.9.  Cuvette Lot#: [id number].  Exp: 10/2010.    Anticoagulation Management Assessment/Plan:      The patient's current anticoagulation dose is Coumadin 5 mg tabs: Take as directed by coumadin clinic..  The target INR is 2.5 - 3.5.  The next INR is due 03/10/2010.  Anticoagulation instructions were given to patient.  Results were reviewed/authorized by Tammy Sours, PharmD.         Prior Anticoagulation Instructions: INR 1.9  Eat 2-3 servings of green vegies per week  Couamdin 5mg  tabs Take 2 tabs today WED 1/18 then 1.5 tab each day  Current Anticoagulation Instructions: INR 3.9   Take only 1 tablet today only. Then begin taking 1.5 tablets everday except 1 tablet on Wednesday and Saturday. Recheck INR in 3 weeks.

## 2010-02-25 DIAGNOSIS — I359 Nonrheumatic aortic valve disorder, unspecified: Secondary | ICD-10-CM

## 2010-02-25 HISTORY — DX: Nonrheumatic aortic valve disorder, unspecified: I35.9

## 2010-03-10 ENCOUNTER — Encounter (INDEPENDENT_AMBULATORY_CARE_PROVIDER_SITE_OTHER): Payer: Medicare Other

## 2010-03-10 ENCOUNTER — Encounter: Payer: Self-pay | Admitting: Cardiovascular Disease

## 2010-03-10 DIAGNOSIS — Z7901 Long term (current) use of anticoagulants: Secondary | ICD-10-CM

## 2010-03-10 DIAGNOSIS — I359 Nonrheumatic aortic valve disorder, unspecified: Secondary | ICD-10-CM

## 2010-03-10 LAB — CONVERTED CEMR LAB: POC INR: 2.9

## 2010-03-16 NOTE — Medication Information (Signed)
Summary: rov/tm  Anticoagulant Therapy  Managed by: Georgina Pillion, PharmD Referring MD: Shawnie Pons MD PCP: Jacques Navy MD Supervising MD: Eden Emms MD, Theron Arista Indication 1: Aortic Valve Replacement (ICD-V43.3) Indication 2: Hyperlipidemia (ICD-Incr L) Lab Used: [facility] Site: [facility] INR POC 2.9 INR RANGE 2.5 - 3.5  Dietary changes: no    Health status changes: no    Bleeding/hemorrhagic complications: no    Recent/future hospitalizations: no    Any changes in medication regimen? no    Recent/future dental: no  Any missed doses?: no       Is patient compliant with meds? yes       Allergies: 1)  ! Codeine  Anticoagulation Management History:      Positive risk factors for bleeding include an age of 75 years or older.  The bleeding index is 'intermediate risk'.  Positive CHADS2 values include History of HTN and Age > 62 years old.  The start date was 05/02/1997.  His last INR was 2.1.  Anticoagulation responsible provider: Eden Emms MD, Theron Arista.  INR POC: 2.9.  Cuvette Lot#: [id number].  Exp: 01/2011.    Anticoagulation Management Assessment/Plan:      The patient's current anticoagulation dose is Coumadin 5 mg tabs: Take as directed by coumadin clinic..  The target INR is 2.5 - 3.5.  The next INR is due 04/07/2010.  Anticoagulation instructions were given to patient.  Results were reviewed/authorized by Georgina Pillion, PharmD.  He was notified by Georgina Pillion PharmD.         Prior Anticoagulation Instructions: INR 3.9   Take only 1 tablet today only. Then begin taking 1.5 tablets everday except 1 tablet on Wednesday and Saturday. Recheck INR in 3 weeks.   Current Anticoagulation Instructions: Continue current regimen of 1 1/2 tablest (7.5 mg) daily EXCEPT for 1 tablet (5 mg) on Wednesdays and Saturdays.  INR 2.9

## 2010-04-07 ENCOUNTER — Ambulatory Visit (INDEPENDENT_AMBULATORY_CARE_PROVIDER_SITE_OTHER): Payer: Medicare Other | Admitting: *Deleted

## 2010-04-07 DIAGNOSIS — I359 Nonrheumatic aortic valve disorder, unspecified: Secondary | ICD-10-CM

## 2010-04-07 NOTE — Patient Instructions (Signed)
INR 2.7 Return to clinic in 1 month Cont with current regimen

## 2010-05-05 ENCOUNTER — Ambulatory Visit (INDEPENDENT_AMBULATORY_CARE_PROVIDER_SITE_OTHER): Payer: Medicare Other | Admitting: *Deleted

## 2010-05-05 DIAGNOSIS — I359 Nonrheumatic aortic valve disorder, unspecified: Secondary | ICD-10-CM

## 2010-05-11 ENCOUNTER — Other Ambulatory Visit: Payer: Self-pay | Admitting: Dermatology

## 2010-05-13 ENCOUNTER — Other Ambulatory Visit: Payer: Self-pay | Admitting: Internal Medicine

## 2010-05-14 ENCOUNTER — Telehealth: Payer: Self-pay | Admitting: *Deleted

## 2010-05-14 MED ORDER — ATORVASTATIN CALCIUM 40 MG PO TABS: 40.0000 mg | ORAL_TABLET | Freq: Every day | ORAL | Status: DC

## 2010-05-14 NOTE — Telephone Encounter (Signed)
rx refill

## 2010-05-18 ENCOUNTER — Encounter: Payer: Self-pay | Admitting: Cardiology

## 2010-05-20 ENCOUNTER — Encounter: Payer: Self-pay | Admitting: Cardiology

## 2010-05-20 ENCOUNTER — Telehealth: Payer: Self-pay | Admitting: Internal Medicine

## 2010-05-20 ENCOUNTER — Ambulatory Visit (INDEPENDENT_AMBULATORY_CARE_PROVIDER_SITE_OTHER): Payer: Medicare Other | Admitting: Cardiology

## 2010-05-20 DIAGNOSIS — Z952 Presence of prosthetic heart valve: Secondary | ICD-10-CM

## 2010-05-20 DIAGNOSIS — I359 Nonrheumatic aortic valve disorder, unspecified: Secondary | ICD-10-CM

## 2010-05-20 DIAGNOSIS — I712 Thoracic aortic aneurysm, without rupture: Secondary | ICD-10-CM

## 2010-05-20 DIAGNOSIS — E785 Hyperlipidemia, unspecified: Secondary | ICD-10-CM

## 2010-05-20 NOTE — Progress Notes (Signed)
HPI:  Patient is doing well, although he does note a bit of fatigue.  He is not short of breath.  He was due to see Dr. Tyrone Sage but has not seen him at all yet.  No contact has been made.  He is due a CT scan which he wants to put off until after his [place] vacation.  He has agreed to have the study in early June.  Labs in November looked pretty good.  He thinks the fatigue may be the heat.  His weight is stable, and his hemoglobin was ok in followup by Dr. Debby Bud.   Current Outpatient Prescriptions  Medication Sig Dispense Refill  . acetaminophen (TYLENOL) 325 MG tablet Take 650 mg by mouth as needed.        Marland Kitchen amoxicillin (AMOXIL) 500 MG capsule Take 500 mg by mouth. As directed per dental work       . atorvastatin (LIPITOR) 40 MG tablet Take 20 mg by mouth daily.        Marland Kitchen ezetimibe (ZETIA) 10 MG tablet Take 10 mg by mouth daily.        . magnesium hydroxide (MILK OF MAGNESIA) 400 MG/5ML suspension Take by mouth daily as needed.        . psyllium (METAMUCIL) 58.6 % powder Take 1 packet by mouth as needed.        . TRIAMTERENE-HCTZ PO Take by mouth. 37.5-25 mg take 1 daily.       Marland Kitchen warfarin (COUMADIN) 5 MG tablet Take by mouth as directed.        Marland Kitchen DISCONTD: atorvastatin (LIPITOR) 40 MG tablet Take 1 tablet (40 mg total) by mouth daily.  30 tablet  6  . DISCONTD: LIPITOR 40 MG tablet TAKE 1/2 TABLET DAILY.  15 each  6  . DISCONTD: polyethylene glycol powder (MIRALAX) powder Take 17 g by mouth as needed.          Allergies  Allergen Reactions  . Codeine     REACTION: N/V    Past Medical History  Diagnosis Date  . Shingles   . Thoracic aneurysm without mention of rupture   . Heart valve replaced by other means   . Aortic valve disorders   . Unspecified essential hypertension   . Other and unspecified hyperlipidemia   . Personal history of colonic polyps   . External hemorrhoids without mention of complication   . Other constipation   . Personal history of malignant neoplasm of  prostate   . Personal history of other disorders of nervous system and sense organs     Past Surgical History  Procedure Date  . Aortic valve replacement     91 st. jude's valve  . Cholecystectomy   . Hemorrhoid surgery   . Prostatectomy 1995  . Tonsillectomy     No family history on file.  History   Social History  . Marital Status: Married    Spouse Name: N/A    Number of Children: N/A  . Years of Education: N/A   Occupational History  . retired    Social History Main Topics  . Smoking status: Former Smoker    Types: Cigarettes    Quit date: 02/06/1972  . Smokeless tobacco: Not on file  . Alcohol Use: Yes     glass of wine daily  . Drug Use: No  . Sexually Active: Not on file   Other Topics Concern  . Not on file   Social History Narrative   Paternal  uncle history of -Prostate Ca.    ROS: Please see the HPI.  All other systems reviewed and negative.  PHYSICAL EXAM:  BP 120/62  Pulse 62  Resp 18  Ht 5\' 11"  (1.803 m)  Wt 151 lb (68.493 kg)  BMI 21.06 kg/m2  General: Well developed, well nourished, in no acute distress. Head:  Normocephalic and atraumatic. Neck: no JVD Lungs: Clear to auscultation and percussion. Heart: Normal S1.  Valve sounds crisp.  No gallop, rub, or murmur.   Abdomen:  Normal bowel sounds; soft; non tender; no organomegaly Pulses: Pulses normal in all 4 extremities. Extremities: No clubbing or cyanosis. No edema. Neurologic: Alert and oriented x 3.  EKG:  NSR.. LAD.Marland Kitchen  Delay R wave progression.  No acute changes.  Borderline QTc.    ASSESSMENT AND PLAN:

## 2010-05-20 NOTE — Assessment & Plan Note (Signed)
Remains on warfarin without difficulty.  CBC was ok.  Valve sounds crisp.  No murmur.  For reevaluation of aortic root.

## 2010-05-20 NOTE — Patient Instructions (Addendum)
Your physician recommends that you return for lab work: BMP (when you return from vacation--the beginning of June)  Non-Cardiac CT Angiography (CTA), is a special type of CT scan that uses a computer to produce multi-dimensional views of major blood vessels throughout the body. In CT angiography, a contrast material is injected through an IV to help visualize the blood vessels--the beginning of June--CTA of Chest with and without contrast Dx: Aortic stenosis and enlarged aortic root  Your physician wants you to follow-up in: 1 YEAR.  You will receive a reminder letter in the mail two months in advance. If you don't receive a letter, please call our office to schedule the follow-up appointment.  Please call Dr Dennie Maizes office and schedule an appointment for this Summer.  [phone])  Your physician recommends that you continue on your current medications as directed. Please refer to the Current Medication list given to you today.

## 2010-05-20 NOTE — Assessment & Plan Note (Signed)
Stable, last checked in October 2010, due for reevaluation.  Will be done on return from [place]. CT ordered.

## 2010-05-20 NOTE — Assessment & Plan Note (Signed)
Near goal, managed by Dr. Debby Bud.

## 2010-05-20 NOTE — Telephone Encounter (Signed)
Pt called and states he is due for a colon in May. Pt does have a recall in the system for May 2012. Pt wanted to schedule his colon in June. Previsit scheduled for 06/29/10@3 :30pm, colon scheduled for 07/06/10@9 :30am. Letter sent to Dr. Riley Kill to ask for instructions regarding coumadin.

## 2010-05-24 NOTE — Telephone Encounter (Signed)
Pt scheduled for an OV with Dr. Marina Goodell for 06/16/10@1 :30pm. Pt aware of appt date and time.

## 2010-05-24 NOTE — Telephone Encounter (Signed)
Left message for pt to call me back 

## 2010-05-24 NOTE — Telephone Encounter (Signed)
David Benton, given his age and medical problems, it would be best it he came in the office to see me for an evaluation before scheduling a colonoscopy. thanks

## 2010-05-25 ENCOUNTER — Other Ambulatory Visit: Payer: Self-pay | Admitting: Cardiology

## 2010-05-26 ENCOUNTER — Ambulatory Visit (INDEPENDENT_AMBULATORY_CARE_PROVIDER_SITE_OTHER): Payer: Medicare Other | Admitting: *Deleted

## 2010-05-26 DIAGNOSIS — I359 Nonrheumatic aortic valve disorder, unspecified: Secondary | ICD-10-CM

## 2010-05-27 ENCOUNTER — Encounter: Payer: Medicare Other | Admitting: *Deleted

## 2010-06-01 NOTE — Assessment & Plan Note (Signed)
[facility]                           PRIMARY CARE OFFICE NOTE   NAME:Arreguin, HANI CAMPUSANO                       MRN:          [id number]  DATE:09/21/2006                            DOB:          October 14, 1929    Mr. Hackbart is a delightful 77 year old gentleman with a history of  aortic valve replacement with a St. Jude's valve who presents for  followup evaluation and exam.  He was last seen by primary care August 23, 2005.  Interval history has been unremarkable.  He has seen his  cardiologist in followup, most recently September 2nd, at which time he  was stable and doing well.   The patient reports he does occasionally get a swollen sore  submandibular lymph node, either right or left which usually is self-  limited.   The patient's chart was reviewed and his last colonoscopy was in 2006,  due for followup in 2009.  His last GU evaluation was in 2006.   PAST MEDICAL HISTORY:   MEDICAL:  1. Usual childhood diseases.  2. Hyperlipidemia.  3. Borderline hypertension.  4. Meniere's disease.   SURGICAL:  1. Tonsillectomy, remote.  2. Hemorrhoidectomy, remote.  3. Cholecystectomy, remote.  4. Aortic valve replacement in 1991 with a St. Jude's valve.  5. Radical prostatectomy in 1995.   CURRENT MEDICATIONS:  1. Coumadin as directed.  2. Lipitor 20 mg daily.  3. Triamterene/hydrochlorothiazide 37.5/25 daily.  4. Metamucil daily.  5. Zetia 10 mg daily.  6. Amoxicillin p.r.n.  7. Ibuprofen p.r.n.   FAMILY HISTORY:  Noncontributory.   SOCIAL HISTORY:  The patient is retired.  He remains very active but  independent in all his activities of daily living.   REVIEW OF SYSTEMS:  Negative for any fevers, sweats, chills, or other  constitutional symptoms.  He has no visual changes with last eye exam in  Spring 2008.  The patient did have an extraction of a pre-molar the left  upper area with no significant complications.  The patient has  occasional  fleeing tightness in his chest but no pain or palpations.  No  limitations in activities.  No respiratory, GI, or GU complaints.   PHYSICAL EXAMINATION:  VITAL SIGNS:  Temperature was 98.6, blood  pressure 125/76, pulse 65, weight 161.5.  GENERAL APPEARANCE:  This is a slender gentleman in no acute distress.  HEENT:  Normocephalic atraumatic.  EACs and TMs were unremarkable.  Oropharynx with missing tooth as noted.  No other lesions were noted.  Conjunctivae and sclerae were clear.  Funduscopic exam was deferred to  ophthalmology.  NECK:  Supple without thyromegaly.  NODES:  Question of very small palpable lymph node in the right  submandibular region, otherwise no adenopathy was noted in the  supraclavicular, axillary regions.  CHEST:  No CVA tenderness.  The patient has a well healed sternotomy  scar.  LUNGS:  Clear with no rales, wheezes, or rhonchi.  CARDIOVASCULAR:  Two plus radial pulse.  No JVD or carotid bruits.  He  had a quiet precordium with a regular rate and rhythm without murmurs,  rubs,  or gallops.  ABDOMEN:  Soft.  No guarding or rebound.  No organomegaly, no  splenomegaly was appreciated.  RECTAL:  Normal sphincter tone was noted.  The patient is absent a  prostate gland.  EXTREMITIES:  Without clubbing, cyanosis, or edema.  No deformities are  noted.   LABORATORY:  Hemoglobin 14.4 grams, white count was 4,000 with a normal  differential.  Chemistries were normal.  Blood sugar was 107.  Kidney  function normal with a creatinine of 1.  Total bilirubin elevated at 2,  otherwise liver functions were totally normal.  Cholesterol 109, HDL  35.8, LDL 62, triglycerides 57.  Thyroid function normal.  The TSH is  3.04.  PSA was 0.01.  Urinalysis was negative.   ASSESSMENT/PLAN:  1. Cardiovascular.  The patient is currently stable.  He is followed      by Dr. Bonnee Quin on a regular basis.  He is to have a followup 2D      echocardiogram.  2. Hyperlipidemia, excellent  control at this time with Zetia alone.      He will continue his medications.  3. Hypertension.  The patient's blood pressure is very well controlled      and he will continue his present medications.  4. Genitourinary.  History of prostate cancer 13 years ago, currently      doing very well.  His PSA is almost undetectable.  Examination was      normal.  5. Health maintenance.  The patient will be due for a colonoscopy in      2009.  The patient has had a pneumonia vaccine in 2005.  Last      tetanus booster was 2005.   In summary, this is a very pleasant gentleman who is medically stable at  this time.  He is asked to return to see me in 1 year or p.r.n.     Rosalyn Gess Norins, MD  Electronically Signed    MEN/MedQ  DD: 09/21/2006  DT: 09/21/2006  Job #: [id number]

## 2010-06-01 NOTE — Assessment & Plan Note (Signed)
[facility]                         GASTROENTEROLOGY OFFICE NOTE   NAME:David Benton, David Benton                       MRN:          [id number]  DATE:05/22/2007                            DOB:          1929-07-18    REASON FOR EVALUATION:  Surveillance colonoscopy.   HISTORY:  This is a 77 year old white male who presents today regarding  surveillance colonoscopy.  He underwent initial complete colonoscopy for  screening in April, 2003.  He was found to have two diminutive polyps,  which were removed, and found to be tubular adenomas.  In addition, left-  sided diverticulosis.  He underwent follow-up surveillance in April,  2006.  Again, diverticulosis and diminutive polyps.  Followup in three  years recommended.  Patient has been clinically stable since that time.  Currently without active GI symptoms.  In particular, no change in bowel  habits, bleeding, or pain.  He continues to see Dr. Debby Bud for his  general medical care and Dr. Riley Kill for his heart.  His most  significant cardiac problem is that of prior aortic valve replacement,  for which he is on chronic Coumadin therapy.   His other medical problems include hypertension and hyperlipidemia as  well as Meniere's disease.  He is status post prostatectomy for prostate  cancer.  Other surgeries include hemorrhoidectomy.   He is allergic to CODEINE, which causes nausea.   CURRENT MEDICATIONS:  Coumadin, Lipitor,  triamterene/hydrochlorothiazide, and Zetia.   FAMILY HISTORY:  Negative for gastrointestinal malignancy.  Mother and  father with heart disease.  Mother with diabetes.  Sister with breast  cancer.   SOCIAL HISTORY:  Patient is married without children.  He lives with his  wife.  He is retired from [employer]  He has a high school education.  He  does use alcohol.   REVIEW OF SYSTEMS:  Per diagnostic evaluation form.   PHYSICAL EXAMINATION:  A well-appearing male in no acute distress.   He  is alert and oriented.  Blood pressure is 110/56, heart rate 72, weight is 160.8 pounds.  He is  5 feet 11 inches in height.  Respirations are 16 and unlabored.  HEENT:  Sclerae are anicteric.  Conjunctivae are pink.  Oral mucosa is  intact.  No adenopathy.  LUNGS:  Clear.  HEART:  Regular.  ABDOMEN:  Soft without tenderness, mass, or hernia.  EXTREMITIES:  Without clubbing, cyanosis or edema.   IMPRESSION:  A 77 year old gentleman with a history of multiple  adenomatous colon polyps and incidental diverticulosis.  He is on  chronic Coumadin therapy for aortic valve replacement.  He presents  today regarding surveillance colonoscopy.  He is an appropriate  candidate without  contraindications.  The nature of the procedure as well as the risks,  benefits and alternatives were reviewed in detail.  He understood and  agreed to proceed.  As previous, we will keep him on his Coumadin for  the examination.     Wilhemina Bonito. Marina Goodell, MD  Electronically Signed    JNP/MedQ  DD: 05/22/2007  DT: 05/22/2007  Job #: [id number]

## 2010-06-01 NOTE — Assessment & Plan Note (Signed)
[facility]                            CARDIOLOGY OFFICE NOTE   NAME:David Benton, David Benton                       MRN:          [id number]  DATE:03/21/2008                            DOB:          November 26, 1929    David Benton is in for followup.  He generally is quite stable.  He is not  having any shortness of breath or progressive chest pain.  He had seen  Dr. Tyrone Sage who has recommended a followup aortic study in 1 year.  We  had talked about beta blockers, but the patient does have first-degree  AV block on EKG.  He is modestly bradycardic and his blood pressure is  under good control on diuretic.  Therefore, we elected not to add this  to his regimen.   CURRENT MEDICATIONS:  1. Coumadin as directed.  2. Lipitor 20 mg daily.  3. Triamterene hydrochlorothiazide 37/25.  4. Zetia 10 mg daily.   On physical, he is alert and oriented in no distress.  The weight is 163  pounds, blood pressure 120/70, pulse is 64.  Bowel sounds are crisp.  No  significant aortic regurgitation is noted.  There is no significant  murmur.   The electrocardiogram demonstrates sinus rhythm.  There is first-degree  AV block with PR interval of 220 milliseconds.   IMPRESSION:  1. Status post aortic valve replacement for bicuspid aortic valve.  2. Hypercholesterolemia on Zetia only.  3. Aortic root dilatation.   PLAN:  1. The patient will have a followup CT in 6 months and will continue      to follow up Dr. Tyrone Sage.  2. Basic metabolic profile will be done today.  3. He will need a lipid profile and seen by Dr. Debby Bud.     David Benton. Riley Kill, MD, [facility]  Electronically Signed    TDS/MedQ  DD: 03/21/2008  DT: 03/22/2008  Job #: [id number]   cc:   Sheliah Plane, MD  Rosalyn Gess. Norins, MD

## 2010-06-01 NOTE — Assessment & Plan Note (Signed)
[facility]                            CARDIOLOGY OFFICE NOTE   NAME:David Benton, David Benton                       MRN:          [id number]  DATE:03/20/2007                            DOB:          [birth year]    David Benton is in for followup.  He appears to be doing quite well.  He denies  any ongoing chest pain.  He has been taking his Coumadin on a regular  basis.  During his last visit in August, he was noted to have some  dilatation of his proximal aortic root on echocardiography.  A CT scan  was done which said demonstrated the ascending thoracic aorta at 4.7 cm.  There is also an incidental 3.9 mm right apical nodule.  A followup was  suggested in possibly 12 months for the nodule.  With regard to that, we  elected to recommend a 69-month followup CT scan specifically however to  follow up on these findings.  Of note, the patient also has had a  physical by Dr. Debby Bud.  On that, his PSA was normal at 0.01.  He also  had an LDL of 62 with an HDL of 35.8.   CURRENT MEDICATIONS:  1. Include Coumadin.  2. Lipitor 20 mg daily.  3. Triamterene hydrochlorothiazide 37/25 daily.  4. Zetia 10 mg daily.   PHYSICAL EXAMINATION:  He is alert and oriented in no distress.  The  weight is 160 pounds that compares similarly to September 2008.  His  blood pressure is 120/70, the pulse is 67.  The lung fields are clear to auscultation and percussion.  The median sternotomy is well-healed.  The valve sounds are crisp.  There are no diastolic murmurs noted.   Electrocardiogram demonstrates sinus rhythm with first-degree AV block.  There is left atrial enlargement, left axis deviation with incomplete  right bundle branch block.  Compared to previous tracings, this does not  appear to be substantially changed and is similar to September of this  year.   IMPRESSION:  1. Status post aortic valve replacement for aortic stenosis.  2. Chronic Coumadin anticoagulation for number  1.  3. Hypercholesterolemia on lipid lowering therapy.  4. Hypertension, well-controlled on current medical regimen.  5. Status post prostatectomy.  6. History of Meniere's disease.   RECOMMENDATIONS:  1. We will repeat a CT scan.  2. We will followup in 6 months.  3. We had a discussion today about continuing or discontinuing Zetia      and at the present time he will remain on the current medical      regimen.     Arturo Morton. Riley Kill, MD, [facility]  Electronically Signed    TDS/MedQ  DD: 03/20/2007  DT: 03/20/2007  Job #: [id number]   cc:   David Benton

## 2010-06-01 NOTE — Assessment & Plan Note (Signed)
OFFICE VISIT   David Benton, David Benton  DOB:  [birth year]                                        December 06, 2007  CHART #:  [id number]   The patient was seen last week and was noted that he is not taking a  beta-blocker, did not start a beta-blocker, but talked to Dr. Riley Kill  who follows the patient.  Today, we discussed the use of beta-blocker  and dilated ascending aorta.  The patient is followed by Dr. Riley Kill.  He has had relatively low blood pressures and has a resting bradycardia,  so we felt that beta-blocker would probably induce increased side  effects to slow heart rate and at this point, we will not start it.  Dr.  Riley Kill noted he would discuss this with the patient.   Sheliah Plane, MD  Electronically Signed   EG/MEDQ  D:  12/06/2007  T:  12/06/2007  Job:  [id number]

## 2010-06-01 NOTE — Assessment & Plan Note (Signed)
OFFICE VISIT   David Benton, David Benton  DOB:  11-Jan-1930                                        December 04, 2008  CHART #:  [id number]   The patient is a 77 year old male who in August 1991 underwent aortic  valve replacement with a #29 St. Jude valve by Dr. Andrey Campanile.  At that  time, at age 89, he is known to have superior calcific aortic stenosis  with a bicuspid aortic valve.  No specific mention of the size of the  aorta was made at that time.  I have followed him since April 2009  because of question of dilated ascending aorta.  CT scans were reviewed,  it shows ascending aorta to be approximately 4.4 to 4.5 mm in size.  He  returns today with a followup CT scan to compare to 1 year ago.  Since  last seen, he has had no change in his medical condition.  He has had no  chest pain or evidence of congestive heart failure.   PHYSICAL EXAMINATION:  On exam, his blood pressure 119/70, pulse is 67,  respiratory rate is 18, O2 sats 97%.  His valve sounds are crisp without  any murmur of aortic insufficiency.  He has a mechanical valve in place.  Abdominal exam is without palpable masses or tenderness.  He has no  pedal edema.   Followup CT scan dated November 07, 2008, is unchanged from the one 1  year previously, still with the aorta less than 5 cm.   He continues on Coumadin as monitored by the [facility], Lipitor,  Zetia, triamterene, Metamucil, and Tylenol p.r.n.  Dr. Riley Kill and I  previously discussed him being on a beta-blocker, but because of his  relative possibly low blood pressure and resting bradycardia, it was  felt that it would create more side effects and it would possibly  benefit him.   I plan to see him back in 18 months after his scheduled CT scan, which  has already been arranged in the [facility].   Sheliah Plane, MD  Electronically Signed   EG/MEDQ  D:  12/04/2008  T:  12/05/2008  Job:  [id number]   cc:   Arturo Morton. Riley Kill, MD,  [facility]

## 2010-06-01 NOTE — Consult Note (Signed)
NEW PATIENT CONSULTATION   David Benton, David Benton  DOB:  06-27-1929                                        May 17, 2007  CHART #:  [id number]   FOLLOW-UP CARDIOLOGIST:  Arturo Morton. Riley Kill, MD, [facility].   PRIMARY CARE PHYSICIAN:  Rosalyn Gess. Norins, MD.   REASON FOR CONSULTATION:  Question of ascending aortic aneurysm and  uncharacterized 3.9 mm right upper lobe lung nodule.   BRIEF HISTORY:  The patient is a 77 year old male with known history of  severe aortic stenosis at age 73 in August 1991.  The patient underwent  aortic valve replacement with a #29 St. Jude mechanical valve by Dr.  Andrey Campanile.  Although the operative note does not specifically mention the  size of the ascending aorta at that time, the family does remember that  Dr. Andrey Campanile told them that the aorta was somewhat enlarged and the fact  that a 29 mm valve was placed, I suspect that the ascending aorta was  dilated to some degree at that time.  The patient has done well over the  years.  Recent echocardiogram suggested dilatation of the ascending  aorta and a CT scan was done in the fall of 2008, it also raised issue  of a 3.9 mm noncharacterized subpleural nodule in the right lung in a  patient with a very limited smoking history.  He has about a five pack-  year smoking history and quit smoking in 1974.  He returns today for a  follow-up visit recommended by Dr. Riley Kill and a follow-up CT scan done  in March of this year to discuss the ascending aorta.  He is  asymptomatic from a cardiac standpoint.  He denies chest pain,  denies  shortness of breath, denies exertional shortness of breast, denies  syncope.  He walks up to six miles a day.   CARDIAC RISK FACTORS:  1. The patient denies hypertension.  2. Has known hyperlipidemia, treated.  3. No diabetes.  4. As noted, is a remote smoker.  5. Has no history of previous stroke or renal insufficiency.   OTHER MEDICAL PROBLEMS:  Meniere's disease.   PAST  SURGICAL HISTORY:  Prostatectomy for cancer of the prostate in 1995  without evidence of recurrence.   SOCIAL HISTORY:  The patient is married, currently retired.   CURRENT MEDICATIONS:  1. Coumadin as monitored by Dr. Rosalyn Charters office.  2. Lipitor 20 mg a day.  3. Triamterene/hydrochlorothiazide 37/25.  4. Zetia 10 mg a day.   ALLERGIES:  He has no known allergies but does note to CODEINE causes  nausea.   REVIEW OF SYSTEMS:  CARDIAC:  Negative for chest pain, resting shortness  of breath, exertional shortness of breath, syncope, presyncope or  palpitation, lower extremity edema, orthopnea.  GENERAL:  He denies  constitutional symptoms.  Denies fever, chills or night sweats.  The  patient has noted four years ago one episode lasting 30 seconds of loss  of central vision bilaterally.  This has never recurred and never been  evaluated.  RESPIRATORY:  As noted above.  Denies hemoptysis.  Has had  no change in bowel habits.  He has had regular scheduled colonoscopies  done.  Denies any hematochezia.  Denies any significant joint  difficulties.  Other review of systems are negative.   PHYSICAL EXAMINATION:  VITAL SIGNS:  His blood pressure is slightly  elevated at 164/83, pulse 66, respiratory rate is 18, O2 sats 99%.  NECK:  He has no carotid bruits.  LUNGS:  Clear bilaterally.  CARDIAC:  Exam reveals regular rate and rhythm without murmur or gallop.  Valve sounds of mechanical valve are crisp without any murmur of aortic  insufficiency.  ABDOMEN:  Exam reveals a thin abdomen.  The abdominal aorta is easy to  palpate but is not palpably enlarged.  He has no other organomegaly.  LOWER EXTREMITIES:  Without edema.   CT scan in the fall of 2008 and March 2009 are reviewed.  The size of  the aorta is approximately 4.4-4.6 cm mid ascending aorta.  If anything,  slightly smaller than what was measured in the fall of 2008.  The  subpleural lung nodule is unchanged and felt by radiology  represent  scarring.   IMPRESSION:  Patient with dilated ascending aorta but not significantly  so, status post aortic valve replacement in 1992.  On long-term Coumadin  therapy.  Otherwise asymptomatic.  From the history the patient gives,  what he was told at the time of surgery and a large valve being place, I  suspect the patient has had chronic dilatation of the ascending aorta  for some time.  Currently his ascending aorta is well under 5 cm in size  and I would not recommend prophylactic ascending aortic replacement at  this time.  In addition, the subpleural lung nodule does not appear to  be a high-risk nodule, but could easily be followed as we follow the  ascending aorta.  The patient's blood pressure is slightly elevated in  the office today.  He is on blood pressure medication but not on a beta  blocker which would be a consideration.  The risks and options of  treatment were discussed with the patient and his wife in detail.  He  notes that he already has a CT scheduled by Dr. Riley Kill in six months  and I have asked him to make a follow-up appointment to see me after  that time.  If we established the size of the aorta and the rate of  change, we could follow up with less frequent CT scans, but I will see  him back in six months after his next follow-up scan.   Sheliah Plane, MD  Electronically Signed   EG/MEDQ  D:  05/17/2007  T:  05/17/2007  Job:  [id number]   cc:   Rosalyn Gess. Norins, MD

## 2010-06-01 NOTE — Assessment & Plan Note (Signed)
[facility]                            CARDIOLOGY OFFICE NOTE   NAME:Paczkowski, JAIDEEP POLLACK                       MRN:          [id number]  DATE:10/02/2007                            DOB:          Nov 14, 1929    [name] is in for followup.  He really is doing quite well.  He saw  Dr. Tyrone Sage.  We are following his thoracic aneurysm.  He will need a  follow up in 1 year with regard to the thoracic aneurysm, but he has a  subpleural nodule that needs followup.  He has no cough, chest pain, or  significant shortness of breath.  He is getting ready to go out [place] for  a 2-week trip.   PHYSICAL EXAMINATION:  VITAL SIGNS:  Today, blood pressure is 120/70 and  pulse is 60.  LUNGS:  Fields are clear.  Valve sound is very crisp.   The patient is stable.  His EKG today demonstrates normal sinus rhythm  with left axis deviation and incomplete right bundle.   He is scheduled to see Dr. Debby Bud in followup for routine physical.  We  will repeat a CT scan, which will only need to be noncontrast at the  present time.  I will see him back in followup in 6 months.     Arturo Morton. Riley Kill, MD, [facility]  Electronically Signed    TDS/MedQ  DD: 10/02/2007  DT: 10/02/2007  Job #: [id number]   cc:   Rosalyn Gess. Norins, MD

## 2010-06-01 NOTE — Assessment & Plan Note (Signed)
OFFICE VISIT   David Benton, David Benton  DOB:  Jun 26, 1929                                        November 29, 2007  CHART #:  [id number]   The patient presents today with a followup CT scan.  He is a 76 year old  male, who remains active.  At age 89, he had severe aortic stenosis.  In  August 1981, underwent aortic valve replacement with a #29 St. Jude  Medical valve by Dr. Andrey Benton.  At that time, the operative note does not  specifically mentioned the size of the ascending aorta.  The patient did  have a bicuspid aortic valve.  I saw him in April 2009 after a CT scan  showed some dilatation of the ascending aorta.  At that time, aorta was  approximately 4.4-4.5 cm in size and not continued good blood pressure  control.  It was recommended no surgical intervention at this time.  Since last seen, the patient has done well without any specific  complaints.   PHYSICAL EXAMINATION:  VITAL SIGNS:  His blood pressure 138/73, pulse is  69, respiratory rate is 18, and O2 sats 100% on room air.  CARDIAC:  The patient appears that on exam he has crisp valve sounds  without murmur of aortic insufficiency.  He does have a mechanical  valve.  LUNGS:  Clear.  ABDOMEN:  Benign without palpable masses or palpable enlargement of his  abdominal aorta.  EXTREMITIES:  He has no pedal edema and palpable pedal pulses.   A followup CT scan is reviewed and is basically unchanged.  The very  proximal ascending aorta in the vicinity of aortic root which is hard to  measure accurately on CT scan anyway is approximately 4.7.  The  ascending aorta appears unchanged in the mid ascending aorta.  Its  largest diameter is approximately 4.4 which was really unchanged.   I have discussed with the patient and his wife again, the risks and  options involved at the current size of the ascending aorta with a  reoperation and no appreciable change over the past 6 months.  I would  not recommend  operative intervention at this time.  Though he is aware  that there is risk of spontaneous dissection even at the current size of  his aorta.  I have recommended we obtain a followup CT scan in 1 year  and in the meantime continue with good blood pressure control including  beta-blocker.  Again as my note in April, I indicated beta-blocker  treatment should be considered even at low dose may be of benefit.  We  will discuss with Dr. Rosalyn Benton office this recommendation.   David Plane, MD  Electronically Signed   EG/MEDQ  D:  11/29/2007  T:  11/29/2007  Job:  [id number]   cc:   David Benton. Riley Kill, MD, [facility]  David Gess. Norins, MD

## 2010-06-01 NOTE — Assessment & Plan Note (Signed)
[facility]                            CARDIOLOGY OFFICE NOTE   NAME:Bose, JOEL COWIN                       MRN:          [id number]  DATE:09/19/2006                            DOB:          1929/08/06    Mr. Pottenger is in for a follow-up visit.  In general, he has been stable.  He has not been having any ongoing chest pain.  He plays golf on a  regular basis.  He walks in a walking group.  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   CURRENT MEDICATIONS:  1. Coumadin as directed.  2. Lipitor 20 mg nightly.  3. Triamterene/HCTZ 37/25.  4. Metamucil 1 daily.  5. Zetia 10 mg daily.   PHYSICAL EXAMINATION:  He is an alert and oriented gentleman in no acute  distress.  His valve sounds are very crisp.  His lung fields are clear to auscultation and percussion.  CARDIAC:  Rhythm is regular.   IMPRESSION:  1. Status post aortic valve replacement.  2. Hypercholesterolemia.   PLAN:  1. Electrocardiogram today, which was done and reveals a sinus rhythm      with a first-degree AV block, left axis deviation and complete      right bundle branch.  2. Echocardiogram to evaluate aortic root.  3. Return to clinic in six months.  4. Basic profile.     Arturo Morton. Riley Kill, MD, [facility]  Electronically Signed    TDS/MedQ  DD: 09/19/2006  DT: 09/19/2006  Job #: [id number]

## 2010-06-04 NOTE — Assessment & Plan Note (Signed)
[facility]                              CARDIOLOGY OFFICE NOTE   NAME:David Benton, David Benton                  MRN:          [id number]  DATE:09/06/2005                            DOB:          August 20, 1929    David Benton is in for a followup visit.  He really is doing quite well.  He denies  any chest pain or shortness of breath.  He just returned from a trip to  [place] in which he had an extensive amount of walking and he  said he had no limitations.  He is now 16 years following aortic valve  replacement.  His lipids were recently checked and were excellent.  He had a  mild elevation in his sugar.   PHYSICAL EXAMINATION:  VITAL SIGNS:  The weight is 159.  Blood pressure  134/72 and the pulse is 57.  LUNGS:  The lung fields are clear.  ABDOMEN: The bowel sounds are crisp.  HEART:  No diastolic or systolic murmurs are noted.  EXTREMITIES:  There is no extremity edema.   LABORATORY DATA:  Recent lipid profile reveals an LDL of 67 with an HDL of  36.  Triglycerides are 51.   MEDICATIONS:  1. Coumadin as directed.  2. Lipitor 20 mg q. h.s.  3. Triamterene/HCTZ 37.5/25 daily.  4. Metamucil daily.  5. Zetia 10 mg daily.   IMPRESSION:  1. Status post aortic valve replacement for aortic stenosis.  2. Chronic Coumadin anticoagulation.  3. Hypercholesterolemia.  4. Hypertension.   DISPOSITION:  1. Continue current regimen.  2. Return to clinic in six months.   ADDENDUM:  EKG today reveals normal sinus rhythm with 1st degree AV block,  left atrial enlargement, incomplete right bundle and rare PVC.                              Arturo Morton. Riley Kill, MD, [facility]   TDS/MedQ  DD:  09/06/2005 DT:  09/06/2005 Job #:  [id number]

## 2010-06-04 NOTE — Assessment & Plan Note (Signed)
[facility]                            CARDIOLOGY OFFICE NOTE   NAME:David Benton, David Benton                       MRN:          [id number]  DATE:03/21/2006                            DOB:          02-Jan-1930    David Benton was in for followup.  He is doing extremely well.  He is  volunteering at [employer].  He is playing golf 2 days a week.  He has  no limitations or shortness of breath.   He is getting his protime checked.  He has not been having nose bleeds.  Of note, he was placed on diuretics for Meniere's disease.  He tried to  stop it and the symptoms came back and has actually helped quite a bit.   PHYSICAL EXAMINATION:  Weight is 160 pounds, which is stable; blood  pressure 138/80; and the pulse is 63.  Jugular veins are nondistended.  There is a crisp bowel sound.  Without murmurs, rubs or gallops.  Lung fields are clear to auscultation and percussion.  EXTREMITIES:  No edema.   EKG reveals normal sinus rhythm with first degree AV block.  There is  rare premature ventricular contraction.  The QT is borderline.   IMPRESSION:  1. Status post aortic valve replacement.  2. Hypercholesterolemia.   PLAN:  1. We will check a basic metabolic profile to exclude metabolic      abnormality from his diuretic.  2. Return to clinic in 6 months.     David Benton. Riley Kill, MD, [facility]  Electronically Signed    TDS/MedQ  DD: 03/21/2006  DT: 03/22/2006  Job #: [id number]   cc:   Rosalyn Gess. Norins, MD

## 2010-06-04 NOTE — Assessment & Plan Note (Signed)
[facility]                             PRIMARY CARE OFFICE NOTE   NAME:David Benton                  MRN:          [id number]  DATE:08/23/2005                            DOB:          1929-03-25    [name] is a 77 year old gentleman with a complex medical history,  including a history of a radical prostatectomy, history of aortic valve  replacement, who presents for follow-up evaluation and exam.  He was last  seen in the office August 13, 2004.  Please see that complete dictation for  past medical history, family history and social history.  In the interval  since that visit the patient has been seen by the cardiology service, Arturo Morton. Riley Kill, MD, on March 08, 2005, and was thought to be stable at that  time.  The patient reports that he has been healthy otherwise with no change  in his medical condition.   CURRENT MEDICATIONS:  1.  Coumadin as directed.  2.  Lipitor 20 mg q.h.s.  3.  Maxzide 37.5/25 mg daily.  4.  Metamucil daily.  5.  Zetia 10 mg daily.  6.  Amoxicillin for prophylaxis prior to dental work.   REVIEW OF SYSTEMS:  Negative for constitutional, cardiovascular,  respiratory, GI or GU problems.   PHYSICAL EXAMINATION:  VITAL SIGNS:  Temperature was 98.5, blood pressure  112/64, pulse 66, weight 160.  GENERAL APPEARANCE:  This is a slender gentleman who looks his stated age,  in no acute distress.  HEENT:  Normocephalic, atraumatic.  EACs and TMs were unremarkable.  Oropharynx with native dentition in good repair.  No buccal or palatal  lesions noted.  The posterior pharynx was clear.  Conjunctivae and sclerae  were clear.  PERRLA, EOMI.  Funduscopic exam was unremarkable.  NECK:  Supple without thyromegaly.  NODES:  No adenopathy was noted in the cervical, supraclavicular or inguinal  regions.  CHEST:  The patient has a well-healed sternotomy scar.  BACK:  He has no CVA tenderness.  LUNGS:  Clear with no  rales, wheezes or rhonchi.  CARDIOVASCULAR;  2+ radial pulses, no JVD, no carotid bruits but he has a  radiated murmur.  Precordium is quiet.  The patient had a very definitive  mechanical valve click heard best at the right sternal border.  His  precordium was quiet.  He has a regular rate with frequent PVCs.  ABDOMEN:  Soft, no guarding, no rebound, no organomsplenomegaly was noted.  GENITALIA:  Normal, bilaterally descended testicles.  RECTAL:  Normal sphincter tone was noted.  The prostate vault was flat with  no palpable abnormality or prostate gland.  EXTREMITIES:  Without clubbing, cyanosis, or edema.  No deformities were  noted.   DATA BASE:  Hemoglobin 14.3 g, white count was 3900 with a normal  differential.  Glucose was 120.  Electrolytes were normal.  Kidney function  normal with a creatinine of 1.1.  Liver functions were normal.  Cholesterol  113, triglycerides 51, HDL 36, LDL 67.  Thyroid function normal with a TSH  of 3.43.  PSA was essentially undetectable at 0.01 ng/mL.  Urinalysis was  negative.   ASSESSMENT AND PLAN:  1.  Cardiovascular.  The patient is stable and is to follow up with Dr.      Riley Kill as instructed.  He is to continue with the Coumadin clinic.  2.  Genitourinary.  The patient is stable with no evidence of any recurrent      disease.  3.  Lipids.  The patient has excellent control of his cholesterol and is at      goal.  We will continue his present medications.  4.  Health maintenance.  The patient is currently up to date with colorectal      cancer screening with colonoscopy as noted.  The patient did have a      chest x-ray March 09, 2005, which showed a previous aortic valve      replacement and was otherwise stable.  The last 2-D echo from August 31, 2004, showed a normal ejection fraction and a well-seated aortic valve.   SUMMARY:  This is a very pleasant gentleman with medical problems outlined  above, who is very stable at this time.   He will continue on his medical  regimen as noted.  He did not require any refill prescriptions at this  visit.  He is asked to return to see me on an as-needed basis.                                   Rosalyn Gess Norins, MD   MEN/MedQ  DD:  08/23/2005  DT:  08/24/2005  Job #:  [id number]   cc:   Jonathon Jordan. Annabell Howells, MD

## 2010-06-16 ENCOUNTER — Ambulatory Visit (INDEPENDENT_AMBULATORY_CARE_PROVIDER_SITE_OTHER): Payer: Medicare Other | Admitting: Internal Medicine

## 2010-06-16 ENCOUNTER — Encounter: Payer: Self-pay | Admitting: Internal Medicine

## 2010-06-16 DIAGNOSIS — Z952 Presence of prosthetic heart valve: Secondary | ICD-10-CM

## 2010-06-16 DIAGNOSIS — Z860100 Personal history of colon polyps, unspecified: Secondary | ICD-10-CM

## 2010-06-16 DIAGNOSIS — K5900 Constipation, unspecified: Secondary | ICD-10-CM

## 2010-06-16 DIAGNOSIS — Z1211 Encounter for screening for malignant neoplasm of colon: Secondary | ICD-10-CM

## 2010-06-16 DIAGNOSIS — D689 Coagulation defect, unspecified: Secondary | ICD-10-CM

## 2010-06-16 DIAGNOSIS — Z954 Presence of other heart-valve replacement: Secondary | ICD-10-CM

## 2010-06-16 MED ORDER — PEG-KCL-NACL-NASULF-NA ASC-C 100 G PO SOLR: 1.0000 | Freq: Once | ORAL | Status: AC

## 2010-06-16 NOTE — Progress Notes (Signed)
HISTORY OF PRESENT ILLNESS:  David Benton is a 77 y.o. male with multiple significant medical problems as listed below. He remains on chronic Coumadin therapy status post aortic valve replacement. He continues to be followed outpatient for a stable thoracic aneurysm. He was last evaluated in this office 09/12/2008 regarding constipation and hemorrhoids. See that dictation for details. He has had prior colonoscopies, most recent May of 2009. He had multiple polyps and is due for followup at this time. For his constipation he takes milk of magnesia with good results. Does have occasional minor rectal bleeding attributed to hemorrhoids. His chronic medical problems have been stable. GI review of systems is otherwise negative.  REVIEW OF SYSTEMS:  All non-GI ROS negative except for chronic hearing problems and eczema  Past Medical History  Diagnosis Date  . Shingles   . Thoracic aneurysm without mention of rupture   . Heart valve replaced by other means   . Aortic valve disorders   . Unspecified essential hypertension   . Other and unspecified hyperlipidemia   . Personal history of colonic polyps   . External hemorrhoids without mention of complication   . Other constipation   . Personal history of malignant neoplasm of prostate   . Personal history of other disorders of nervous system and sense organs   . Meniere's disease     Past Surgical History  Procedure Date  . Aortic valve replacement     91 st. jude's valve  . Cholecystectomy   . Hemorrhoid surgery   . Prostatectomy 1995  . Tonsillectomy     Social History David Benton  reports that he quit smoking about 38 years ago. His smoking use included Cigarettes. He does not have any smokeless tobacco history on file. He reports that he drinks alcohol. He reports that he does not use illicit drugs.  family history includes Diabetes in his brother; Heart attack in his father; and Prostate cancer in his brother.  There is no  history of Colon cancer.  Allergies  Allergen Reactions  . Codeine     REACTION: N/V       PHYSICAL EXAMINATION: Vital signs: BP 112/62  Pulse 68  Ht 5\' 11"  (1.803 m)  Wt 154 lb (69.854 kg)  BMI 21.48 kg/m2  Constitutional: generally well-appearing, no acute distress Psychiatric: alert and oriented x3, cooperative Eyes: extraocular movements intact, anicteric, conjunctiva pink Mouth: oral pharynx moist, no lesions Neck: supple no lymphadenopathy Cardiovascular: heart regular rate and rhythm with intermittent irregular beats, no murmur. Mechanical heart sounds Lungs: clear to auscultation bilaterally Abdomen: soft, nontender, nondistended, no obvious ascites, no peritoneal signs, normal bowel sounds, no organomegaly Rectal: Deferred until colonoscopy Extremities: no lower extremity edema bilaterally Skin: no lesions on visible extremities Neuro: No focal deficits. No asterixis.    ASSESSMENT:  #1. Functional constipation #2. Minor rectal bleeding due to hemorrhoids #3. History of adenomatous colon polyps. Due for followup #4. Multiple significant medical problems. Stable #5. Chronic anticoagulation the form of Coumadin for prior heart valve replacement   PLAN:  #1. Continue milk of magnesia when necessary for constipation #2. Surveillance colonoscopy. The patient is high risk given his comorbidities and chronic anticoagulation.The nature of the procedure, as well as the risks, benefits, and alternatives were carefully and thoroughly reviewed with the patient. Ample time for discussion and questions allowed. The patient understood, was satisfied, and agreed to proceed. Movi prep prescribed. The patient instructed on its use. #3. We discussed the pros and cons of  interrupting Coumadin therapy. We have usually decided to perform his procedure on Coumadin, as previously done. He understands the potential limitations of this approach.

## 2010-06-16 NOTE — Patient Instructions (Signed)
[facility]on [facility] 07/06/10 9:30 am arrive at 8:30 am on 4th floor Moviprep prescription has been sent to your pharmacy Colon brochure given for you to review. Stay on Coumadin per Dr. Marina Goodell.

## 2010-06-22 ENCOUNTER — Other Ambulatory Visit (INDEPENDENT_AMBULATORY_CARE_PROVIDER_SITE_OTHER): Payer: Medicare Other | Admitting: *Deleted

## 2010-06-22 DIAGNOSIS — E785 Hyperlipidemia, unspecified: Secondary | ICD-10-CM

## 2010-06-22 DIAGNOSIS — I359 Nonrheumatic aortic valve disorder, unspecified: Secondary | ICD-10-CM

## 2010-06-22 DIAGNOSIS — Z952 Presence of prosthetic heart valve: Secondary | ICD-10-CM

## 2010-06-22 DIAGNOSIS — I712 Thoracic aortic aneurysm, without rupture, unspecified: Secondary | ICD-10-CM

## 2010-06-22 LAB — BASIC METABOLIC PANEL
BUN: 22 mg/dL (ref 6–23)
Calcium: 9.3 mg/dL (ref 8.4–10.5)
Creatinine, Ser: 1 mg/dL (ref 0.4–1.5)
GFR: 73.24 mL/min (ref >60.00)
Potassium: 4.3 mEq/L (ref 3.5–5.1)

## 2010-06-23 ENCOUNTER — Encounter: Payer: Self-pay | Admitting: Internal Medicine

## 2010-06-23 ENCOUNTER — Ambulatory Visit (INDEPENDENT_AMBULATORY_CARE_PROVIDER_SITE_OTHER): Payer: Medicare Other | Admitting: *Deleted

## 2010-06-23 DIAGNOSIS — I359 Nonrheumatic aortic valve disorder, unspecified: Secondary | ICD-10-CM

## 2010-06-23 LAB — POCT INR: INR: 2.9

## 2010-06-24 ENCOUNTER — Ambulatory Visit (INDEPENDENT_AMBULATORY_CARE_PROVIDER_SITE_OTHER)
Admission: RE | Admit: 2010-06-24 | Discharge: 2010-06-24 | Disposition: A | Discharge status: Home / Self Care | Payer: Medicare Other | Source: Ambulatory Visit | Attending: Cardiology | Admitting: Cardiology

## 2010-06-24 DIAGNOSIS — Z952 Presence of prosthetic heart valve: Secondary | ICD-10-CM

## 2010-06-24 DIAGNOSIS — I712 Thoracic aortic aneurysm, without rupture, unspecified: Secondary | ICD-10-CM

## 2010-06-24 DIAGNOSIS — Z954 Presence of other heart-valve replacement: Secondary | ICD-10-CM

## 2010-06-24 MED ORDER — IOHEXOL 300 MG/ML  SOLN
80.0000 mL | Freq: Once | INTRAMUSCULAR | Status: AC | PRN
  Administered 2010-06-24: 80 mL via INTRAVENOUS

## 2010-06-28 HISTORY — PX: COLONOSCOPY: SHX174

## 2010-06-29 ENCOUNTER — Telehealth: Payer: Self-pay | Admitting: Gastroenterology

## 2010-06-29 NOTE — Telephone Encounter (Signed)
Pt just wanted [place] to know that his co pay for Movi Prep was $62.23 and his wife had [employer] with flavor packets and her co pay was $ 62.23,

## 2010-07-06 ENCOUNTER — Ambulatory Visit (AMBULATORY_SURGERY_CENTER): Payer: Medicare Other | Admitting: Internal Medicine

## 2010-07-06 ENCOUNTER — Encounter: Payer: Self-pay | Admitting: Internal Medicine

## 2010-07-06 VITALS — BP 141/71 | HR 61 | Temp 96.7°F | Resp 20 | Ht 71.0 in | Wt 155.0 lb

## 2010-07-06 DIAGNOSIS — Z1211 Encounter for screening for malignant neoplasm of colon: Secondary | ICD-10-CM

## 2010-07-06 DIAGNOSIS — Z860100 Personal history of colon polyps, unspecified: Secondary | ICD-10-CM

## 2010-07-06 DIAGNOSIS — D1330 Benign neoplasm of unspecified part of small intestine: Secondary | ICD-10-CM

## 2010-07-06 DIAGNOSIS — K5730 Diverticulosis of large intestine without perforation or abscess without bleeding: Secondary | ICD-10-CM

## 2010-07-06 DIAGNOSIS — D126 Benign neoplasm of colon, unspecified: Secondary | ICD-10-CM

## 2010-07-06 MED ORDER — SODIUM CHLORIDE 0.9 % IV SOLN: 500.0000 mL | INTRAVENOUS | Status: DC

## 2010-07-06 NOTE — Patient Instructions (Signed)
Follow discharge instructions.  Resume your medications.  Await pathology results.

## 2010-07-07 ENCOUNTER — Telehealth: Payer: Self-pay

## 2010-07-07 NOTE — Telephone Encounter (Signed)

## 2010-07-20 ENCOUNTER — Ambulatory Visit (INDEPENDENT_AMBULATORY_CARE_PROVIDER_SITE_OTHER): Payer: Medicare Other | Admitting: *Deleted

## 2010-07-20 DIAGNOSIS — I359 Nonrheumatic aortic valve disorder, unspecified: Secondary | ICD-10-CM

## 2010-07-29 ENCOUNTER — Ambulatory Visit (INDEPENDENT_AMBULATORY_CARE_PROVIDER_SITE_OTHER): Payer: Medicare Other | Admitting: Cardiothoracic Surgery

## 2010-07-29 DIAGNOSIS — I712 Thoracic aortic aneurysm, without rupture: Secondary | ICD-10-CM

## 2010-07-29 NOTE — Assessment & Plan Note (Signed)
OFFICE VISIT  David Benton, David Benton DOB:  Sep 01, 1929                                        July 29, 2010 CHART #:  [id number]  David Benton returns to the office today in followup.  He was originally underwent aortic valve replacement with a 29 St. Jude mechanical valve by Dr. Andrey Campanile in August 1991.  I first saw him in April 2009 because of echocardiogram.  It showed dilatation of the ascending aorta.  He has had followups, was at that time was found to have a  4.4 cm dilated ascending aorta and subsequently has had repeat CT scans done, most recent that is reviewed on June 23, 2010, which showed dilatation of ascending aorta of 4.3 cm _______ Valsalva and unchanged from previous. He had scattered subpleural nodules that are also unchanged.  Since last seen, the patient has done well, was asymptomatic from a cardiac standpoint.  He denies shortness of breath or angina.  He remains physically active at age 77.  On physical exam, his blood pressure 138/80, pulse 65, respiratory rates 18, and O2 sats 98%.  His sternum is stable and well healed.  His lungs are clear bilaterally.  He has no pedal edema.  On abdominal exam, his abdominal aorta is not palpably enlarged.  I have reviewed the CT scan done at [facility] dated on June 24, 2010 and this appears unchanged from the one CT scan 18 months prior.  I have reviewed this with the patient at this point with aorta at 4.3-4.4 cm, would not recommend elective replacement of his ascending aorta with redo sternotomy.  I have reviewed with the patient and his wife the signs and symptoms of aortic dissection.  I have discussed with Dr. Riley Benton in the past use of beta-blockers in the patient, but he did not tolerate them in the past as well, so we have avoided.  I will plan to see him back again in 18 months with a seat CTA of the chest to reevaluate the size of aorta.  David Plane, MD Electronically  Signed  EG/MEDQ  D:  07/29/2010  T:  07/29/2010  Job:  [id number]  cc:   David Morton. Riley Kill, MD, [facility] David Gess. Norins, MD

## 2010-08-08 ENCOUNTER — Other Ambulatory Visit: Payer: Self-pay | Admitting: Cardiology

## 2010-08-18 ENCOUNTER — Ambulatory Visit (INDEPENDENT_AMBULATORY_CARE_PROVIDER_SITE_OTHER): Payer: Medicare Other | Admitting: *Deleted

## 2010-08-18 DIAGNOSIS — I359 Nonrheumatic aortic valve disorder, unspecified: Secondary | ICD-10-CM

## 2010-09-15 ENCOUNTER — Ambulatory Visit (INDEPENDENT_AMBULATORY_CARE_PROVIDER_SITE_OTHER): Payer: Medicare Other | Admitting: *Deleted

## 2010-09-15 DIAGNOSIS — Z7901 Long term (current) use of anticoagulants: Secondary | ICD-10-CM

## 2010-09-15 DIAGNOSIS — I359 Nonrheumatic aortic valve disorder, unspecified: Secondary | ICD-10-CM

## 2010-09-15 HISTORY — DX: Long term (current) use of anticoagulants: Z79.01

## 2010-10-13 ENCOUNTER — Ambulatory Visit (INDEPENDENT_AMBULATORY_CARE_PROVIDER_SITE_OTHER): Payer: Medicare Other | Admitting: *Deleted

## 2010-10-13 DIAGNOSIS — I359 Nonrheumatic aortic valve disorder, unspecified: Secondary | ICD-10-CM

## 2010-10-13 LAB — POCT INR: INR: 2.5

## 2010-10-27 ENCOUNTER — Other Ambulatory Visit: Payer: Self-pay | Admitting: Cardiology

## 2010-10-27 NOTE — Telephone Encounter (Signed)
Coumadin rx request

## 2010-11-10 ENCOUNTER — Ambulatory Visit (INDEPENDENT_AMBULATORY_CARE_PROVIDER_SITE_OTHER): Payer: Medicare Other | Admitting: *Deleted

## 2010-11-10 DIAGNOSIS — Z954 Presence of other heart-valve replacement: Secondary | ICD-10-CM

## 2010-11-10 DIAGNOSIS — I359 Nonrheumatic aortic valve disorder, unspecified: Secondary | ICD-10-CM

## 2010-11-10 DIAGNOSIS — Z7901 Long term (current) use of anticoagulants: Secondary | ICD-10-CM

## 2010-12-10 ENCOUNTER — Other Ambulatory Visit: Payer: Self-pay | Admitting: *Deleted

## 2010-12-10 MED ORDER — EZETIMIBE 10 MG PO TABS: 10.0000 mg | ORAL_TABLET | Freq: Every day | ORAL | Status: DC

## 2010-12-22 ENCOUNTER — Ambulatory Visit (INDEPENDENT_AMBULATORY_CARE_PROVIDER_SITE_OTHER): Payer: Medicare Other | Admitting: *Deleted

## 2010-12-22 DIAGNOSIS — Z954 Presence of other heart-valve replacement: Secondary | ICD-10-CM

## 2010-12-22 DIAGNOSIS — Z7901 Long term (current) use of anticoagulants: Secondary | ICD-10-CM

## 2010-12-22 DIAGNOSIS — I359 Nonrheumatic aortic valve disorder, unspecified: Secondary | ICD-10-CM

## 2010-12-22 LAB — POCT INR: INR: 2.6

## 2010-12-26 ENCOUNTER — Other Ambulatory Visit: Payer: Self-pay | Admitting: Cardiology

## 2010-12-28 ENCOUNTER — Other Ambulatory Visit: Payer: Self-pay | Admitting: Cardiothoracic Surgery

## 2010-12-28 DIAGNOSIS — I712 Thoracic aortic aneurysm, without rupture: Secondary | ICD-10-CM

## 2011-01-13 ENCOUNTER — Encounter: Payer: Self-pay | Admitting: Internal Medicine

## 2011-01-20 ENCOUNTER — Encounter: Payer: Self-pay | Admitting: Internal Medicine

## 2011-01-20 ENCOUNTER — Other Ambulatory Visit (INDEPENDENT_AMBULATORY_CARE_PROVIDER_SITE_OTHER): Payer: Medicare Other

## 2011-01-20 ENCOUNTER — Ambulatory Visit (INDEPENDENT_AMBULATORY_CARE_PROVIDER_SITE_OTHER): Payer: Medicare Other | Admitting: Internal Medicine

## 2011-01-20 DIAGNOSIS — Z0000 Encounter for general adult medical examination without abnormal findings: Secondary | ICD-10-CM

## 2011-01-20 DIAGNOSIS — E785 Hyperlipidemia, unspecified: Secondary | ICD-10-CM

## 2011-01-20 DIAGNOSIS — I10 Essential (primary) hypertension: Secondary | ICD-10-CM

## 2011-01-20 LAB — COMPREHENSIVE METABOLIC PANEL
ALT: 32 U/L (ref 0–53)
AST: 26 U/L (ref 0–37)
Alkaline Phosphatase: 80 U/L (ref 39–117)
Calcium: 9.6 mg/dL (ref 8.4–10.5)
Chloride: 105 mEq/L (ref 96–112)
Creatinine, Ser: 1 mg/dL (ref 0.4–1.5)
Total Bilirubin: 2.3 mg/dL — ABNORMAL HIGH (ref 0.3–1.2)

## 2011-01-20 LAB — HEPATIC FUNCTION PANEL
ALT: 32 U/L (ref 0–53)
Albumin: 4.5 g/dL (ref 3.5–5.2)
Alkaline Phosphatase: 80 U/L (ref 39–117)
Total Protein: 7.1 g/dL (ref 6.0–8.3)

## 2011-01-20 LAB — LIPID PANEL
Cholesterol: 137 mg/dL (ref 0–200)
LDL Cholesterol: 67 mg/dL (ref 0–99)
Triglycerides: 66 mg/dL (ref 0.0–149.0)

## 2011-01-20 NOTE — Progress Notes (Signed)
Subjective:    Patient ID: David Benton, male    DOB: January 01, 1930, 78 y.o.   MRN: [id number]  HPI The patient is here for annual Medicare wellness examination and management of other chronic and acute problems. Feeling good with no complaints.   The risk factors are reflected in the social history.  The roster of all physicians providing medical care to patient - is listed in the Snapshot section of the chart.  Activities of daily living:  The patient is 100% inedpendent in all ADLs: dressing, toileting, feeding as well as independent mobility  Home safety : The patient has smoke detectors in the home. They wear seatbelts. No firearms at home. There is no violence in the home. No falls. House is clutter free. No grab bars in bathroom.   There is no risks for hepatitis, STDs or HIV. There is no   history of blood transfusion. They have no travel history to infectious disease endemic areas of the world.  The patient has seen their dentist in the last six month. They have seen their eye doctor in the last year. They admit to hearing difficulty - loss is left ear but have not had audiologic testing in the last year.  They do not  have excessive sun exposure. Discussed the need for sun protection: hats, long sleeves and use of sunscreen if there is significant sun exposure.   Diet: the importance of a healthy diet is discussed. They do have a healthy diet.  The patient has a regular exercise program: walks and golf , 2 hr duration, 3 per week.  The benefits of regular aerobic exercise were discussed.  Depression screen: there are  signs or vegative symptoms of depression- irritability, anhedonia. Nothing major.  Cognitive assessment: the patient manages all their financial and personal affairs and is actively engaged. They could relate day,date,year and events; recalled 3/3 objects at 3 minutes.   The following portions of the patient's history were reviewed and updated as appropriate:  allergies, current medications, past family history, past medical history,  past surgical history, past social history  and problem list.  Vision, hearing, body mass index were assessed and reviewed.   During the course of the visit the patient was educated and counseled about appropriate screening and preventive services including : fall prevention , diabetes screening, nutrition counseling, colorectal cancer screening, and recommended immunizations.  Past Medical History  Diagnosis Date  . Shingles   . Thoracic aneurysm without mention of rupture   . Heart valve replaced by other means   . Aortic valve disorders   . Unspecified essential hypertension   . Other and unspecified hyperlipidemia   . Personal history of colonic polyps   . External hemorrhoids without mention of complication   . Other constipation   . Personal history of malignant neoplasm of prostate   . Personal history of other disorders of nervous system and sense organs   . Meniere's disease   . AVD (aortic valve disease) 02/25/2010  . Encounter for long-term (current) use of anticoagulants 09/15/2010  . SHINGLES 11/26/2009   Past Surgical History  Procedure Date  . Aortic valve replacement     91 st. jude's valve  . Cholecystectomy   . Hemorrhoid surgery   . Prostatectomy 1995  . Tonsillectomy    Family History  Problem Relation Age of Onset  . Heart attack Father     mother  . Colon cancer Neg Hx   . Prostate cancer Brother   .  Diabetes Brother    History   Social History  . Marital Status: Married    Spouse Name: N/A    Number of Children: N/A  . Years of Education: N/A   Occupational History  . retired    Social History Main Topics  . Smoking status: Former Smoker    Types: Cigarettes    Quit date: 02/06/1972  . Smokeless tobacco: Not on file  . Alcohol Use: Yes     glass of wine daily  . Drug Use: No  . Sexually Active: Not on file   Other Topics Concern  . Not on file   Social  History Narrative   Paternal uncle history of -Prostate Ca.        Review of Systems Constitutional:  Negative for fever, chills, activity change and unexpected weight change.  HEENT:  Negative for hearing loss, ear pain, congestion, neck stiffness and postnasal drip. Negative for sore throat or swallowing problems. Negative for dental complaints.   Eyes: Positive for vision loss that only lasted a few seconds may '11 or change in visual acuity.  Respiratory: Negative for chest tightness and wheezing. Negative for DOE.   Cardiovascular: Negative for chest pain or palpitations. No decreased exercise tolerance Gastrointestinal: No change in bowel habit. No bloating or gas. No reflux or indigestion Genitourinary: Negative for urgency, frequency, flank pain and difficulty urinating.  Musculoskeletal: Negative for myalgias, back pain, arthralgias and gait problem.  Neurological: Negative for dizziness, weakness and headaches. Intermittent minor tremor Hematological: Negative for adenopathy.  Psychiatric/Behavioral: Negative for behavioral problems and dysphoric mood.       Objective:   Physical Exam Vital signs reviewed-stable Gen'l: Well nourished well developed white male in no acute distress  HEENT: Head: Normocephalic and atraumatic. Right Ear: External ear normal. EAC/TM nl. Left Ear: External ear normal.  EAC/TM nl. Nose: Nose normal. Mouth/Throat: Oropharynx is clear and moist. Dentition - native, in good repair. No buccal or palatal lesions. Posterior pharynx clear. Eyes: Conjunctivae and sclera clear. EOM intact. Pupils are equal, round, and reactive to light. Right eye exhibits no discharge. Left eye exhibits no discharge. Neck: Normal range of motion. Neck supple. No JVD present. No tracheal deviation present. No thyromegaly present.  Cardiovascular: Normal rate, regular rhythm, no gallop, no friction rub. Mechanical valve murmur/click heard best at RSB      Quiet precordium. 2+  radial and DP pulses . No carotid bruits Pulmonary/Chest: Effort normal. No respiratory distress or increased WOB, no wheezes, no rales. No chest wall deformity or CVAT. Abdominal: Soft. Bowel sounds are normal in all quadrants. He exhibits no distension, no tenderness, no rebound or guarding, No heptosplenomegaly  Genitourinary:   Musculoskeletal: Normal range of motion. He exhibits no edema and no tenderness.       Small and large joints without redness, synovial thickening or deformity. Full range of motion preserved about all small, median and large joints.  Lymphadenopathy:    He has no cervical or supraclavicular adenopathy.  Neurological: He is alert and oriented to person, place, and time. CN II-XII intact. DTRs 2+ and symmetrical biceps, radial and patellar tendons. Cerebellar function normal with no tremor, rigidity, normal gait and station.  Skin: Skin is warm and dry. No rash noted. No erythema.  Psychiatric: He has a normal mood and affect. His behavior is normal. Thought content normal.   Lab Results  Component Value Date   WBC 4.3* 11/20/2009   HGB 14.4 11/20/2009   HCT 41.2 11/20/2009  PLT 215.0 11/20/2009   GLUCOSE 102* 01/20/2011   CHOL 137 01/20/2011   TRIG 66.0 01/20/2011   HDL 57.20 01/20/2011   LDLCALC 67 01/20/2011   ALT 32 01/20/2011   ALT 32 01/20/2011   AST 26 01/20/2011   AST 26 01/20/2011   NA 142 01/20/2011   K 4.4 01/20/2011   CL 105 01/20/2011   CREATININE 1.0 01/20/2011   BUN 24* 01/20/2011   CO2 31 01/20/2011   TSH 3.80 11/20/2009   PSA 0.01* 11/20/2009   INR 2.6 12/22/2010            Assessment & Plan:

## 2011-01-23 DIAGNOSIS — Z0000 Encounter for general adult medical examination without abnormal findings: Secondary | ICD-10-CM | POA: Insufficient documentation

## 2011-01-23 NOTE — Assessment & Plan Note (Signed)
Interval medical history is benign. Physical exam, sans prostate, is normal with soft AoV murmur. He is current and done with colorectal cancer screening. Immunizations are up to date. He is current with cardiology for follow-up.  In summary - a very nice man who is in good shape, exercises regularly and has a healthy diet. He is asked to return as needed or in 1 year.

## 2011-01-23 NOTE — Assessment & Plan Note (Signed)
BP Readings from Last 3 Encounters:  01/20/11 110/72  07/06/10 141/71  06/16/10 112/62   Very good control - will continue present medications.

## 2011-01-23 NOTE — Assessment & Plan Note (Signed)
Lab reveals good control with LDL better than goal of 80 or less.  Plan - continue present regimen

## 2011-01-27 ENCOUNTER — Other Ambulatory Visit: Payer: Medicare Other

## 2011-01-27 ENCOUNTER — Ambulatory Visit: Payer: Medicare Other | Admitting: Cardiothoracic Surgery

## 2011-02-02 ENCOUNTER — Ambulatory Visit (INDEPENDENT_AMBULATORY_CARE_PROVIDER_SITE_OTHER): Payer: Medicare Other | Admitting: *Deleted

## 2011-02-02 DIAGNOSIS — I359 Nonrheumatic aortic valve disorder, unspecified: Secondary | ICD-10-CM

## 2011-03-16 ENCOUNTER — Ambulatory Visit (INDEPENDENT_AMBULATORY_CARE_PROVIDER_SITE_OTHER): Payer: Medicare Other | Admitting: *Deleted

## 2011-03-16 DIAGNOSIS — Z954 Presence of other heart-valve replacement: Secondary | ICD-10-CM

## 2011-03-16 DIAGNOSIS — Z7901 Long term (current) use of anticoagulants: Secondary | ICD-10-CM

## 2011-03-16 LAB — POCT INR: INR: 3

## 2011-04-27 ENCOUNTER — Ambulatory Visit (INDEPENDENT_AMBULATORY_CARE_PROVIDER_SITE_OTHER): Payer: Medicare Other | Admitting: *Deleted

## 2011-04-27 DIAGNOSIS — Z7901 Long term (current) use of anticoagulants: Secondary | ICD-10-CM

## 2011-04-27 DIAGNOSIS — Z954 Presence of other heart-valve replacement: Secondary | ICD-10-CM

## 2011-04-27 LAB — POCT INR: INR: 2.6

## 2011-05-13 ENCOUNTER — Other Ambulatory Visit: Payer: Self-pay | Admitting: Cardiology

## 2011-05-24 ENCOUNTER — Encounter: Payer: Self-pay | Admitting: Cardiology

## 2011-05-24 ENCOUNTER — Ambulatory Visit (INDEPENDENT_AMBULATORY_CARE_PROVIDER_SITE_OTHER): Payer: Medicare Other | Admitting: Cardiology

## 2011-05-24 VITALS — BP 134/72 | HR 62 | Ht 68.0 in | Wt 151.1 lb

## 2011-05-24 DIAGNOSIS — S0990XA Unspecified injury of head, initial encounter: Secondary | ICD-10-CM | POA: Insufficient documentation

## 2011-05-24 DIAGNOSIS — E785 Hyperlipidemia, unspecified: Secondary | ICD-10-CM

## 2011-05-24 DIAGNOSIS — Z952 Presence of prosthetic heart valve: Secondary | ICD-10-CM

## 2011-05-24 DIAGNOSIS — I10 Essential (primary) hypertension: Secondary | ICD-10-CM

## 2011-05-24 DIAGNOSIS — Z954 Presence of other heart-valve replacement: Secondary | ICD-10-CM

## 2011-05-24 DIAGNOSIS — I359 Nonrheumatic aortic valve disorder, unspecified: Secondary | ICD-10-CM

## 2011-05-24 DIAGNOSIS — Z7901 Long term (current) use of anticoagulants: Secondary | ICD-10-CM

## 2011-05-24 DIAGNOSIS — I712 Thoracic aortic aneurysm, without rupture: Secondary | ICD-10-CM

## 2011-05-24 LAB — CBC WITH DIFFERENTIAL/PLATELET
Basophils Relative: 1.1 % (ref 0.0–3.0)
Eosinophils Relative: 3.1 % (ref 0.0–5.0)
Hemoglobin: 13.8 g/dL (ref 13.0–17.0)
Lymphocytes Relative: 26.1 % (ref 12.0–46.0)
Monocytes Relative: 12.7 % — ABNORMAL HIGH (ref 3.0–12.0)
Neutro Abs: 2.3 10*3/uL (ref 1.4–7.7)
Neutrophils Relative %: 57 % (ref 43.0–77.0)
RBC: 4.46 Mil/uL (ref 4.22–5.81)
WBC: 4 10*3/uL — ABNORMAL LOW (ref 4.5–10.5)

## 2011-05-24 NOTE — Patient Instructions (Signed)
Your physician wants you to follow-up in: 1 YEAR with Dr Marca Ancona. You will receive a reminder letter in the mail two months in advance. If you don't receive a letter, please call our office to schedule the follow-up appointment.  Your physician recommends that you have lab work today: CBC  Your physician recommends that you continue on your current medications as directed. Please refer to the Current Medication list given to you today.

## 2011-05-24 NOTE — Assessment & Plan Note (Signed)
Sp 22 years St jude AVR.  Excellent sounds.  Last echo was 2008.  Yearly CT follow up currently and unremarkable exam.  Would consider repeating next year when seen in follow up.

## 2011-05-24 NOTE — Assessment & Plan Note (Signed)
Went through things to watch for to activate medical evaluation.  No current symptoms

## 2011-05-24 NOTE — Progress Notes (Signed)
HPI:  He is doing well.  No major symptoms.  He did bump his head on a marble table yesterday while using a hand vacuum, and bled some.  He and his wife applied some liquid bandage, and the bleeding resolved.  Denies headache, neuro symptoms, or other signs of head trauma.  Walks 10ks regularly and has walked in all [place].  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  Has an enlarged aortic root, and scheduled for continued follow up with Dr. Tyrone Sage.    Current Outpatient Prescriptions  Medication Sig Dispense Refill  . acetaminophen (TYLENOL) 325 MG tablet Take 650 mg by mouth as needed.        Marland Kitchen amoxicillin (AMOXIL) 500 MG capsule Take 500 mg by mouth. As directed per dental work as needed      . atorvastatin (LIPITOR) 40 MG tablet Take 20 mg by mouth daily.      Marland Kitchen COUMADIN 5 MG tablet TAKE AS DIRECTED BY COUMADIN CLINIC.  45 each  3  . ezetimibe (ZETIA) 10 MG tablet Take 1 tablet (10 mg total) by mouth daily.  30 tablet  6  . magnesium hydroxide (MILK OF MAGNESIA) 400 MG/5ML suspension Take by mouth daily as needed.        . psyllium (METAMUCIL) 58.6 % powder Take 1 packet by mouth as needed.        . TRIAMTERENE-HCTZ PO Take by mouth. 37.5-25 mg take 1 daily.        Current Facility-Administered Medications  Medication Dose Route Frequency Provider Last Rate Last Dose  . 0.9 %  sodium chloride infusion  500 mL Intravenous Continuous Hilarie Fredrickson, MD        Allergies  Allergen Reactions  . Codeine     REACTION: N/V    Past Medical History  Diagnosis Date  . Shingles   . Thoracic aneurysm without mention of rupture   . Heart valve replaced by other means   . Aortic valve disorders   . Unspecified essential hypertension   . Other and unspecified hyperlipidemia   . Personal history of colonic polyps   . External hemorrhoids without mention of complication   . Other constipation   . Personal history of malignant neoplasm of  prostate   . Personal history of other disorders of nervous system and sense organs   . Meniere's disease   . AVD (aortic valve disease) 02/25/2010  . Encounter for long-term (current) use of anticoagulants 09/15/2010  . SHINGLES 11/26/2009    Past Surgical History  Procedure Date  . Aortic valve replacement     91 st. jude's valve  . Cholecystectomy   . Hemorrhoid surgery   . Prostatectomy 1995  . Tonsillectomy     Family History  Problem Relation Age of Onset  . Heart attack Father     mother  . Colon cancer Neg Hx   . Prostate cancer Brother   . Diabetes Brother     History   Social History  . Marital Status: Married    Spouse Name: N/A    Number of Children: N/A  . Years of Education: N/A   Occupational History  . retired    Social History Main Topics  . Smoking status: Former Smoker    Types: Cigarettes    Quit date: 02/06/1972  . Smokeless tobacco: Not on file  . Alcohol Use: Yes     glass of wine daily  .  Drug Use: No  . Sexually Active: Not on file   Other Topics Concern  . Not on file   Social History Narrative   Paternal uncle history of -Prostate Ca.    ROS: Please see the HPI.  All other systems reviewed and negative.  PHYSICAL EXAM:  BP 134/72  Pulse 62  Ht 5\' 8"  (1.727 m)  Wt 151 lb 1.9 oz (68.548 kg)  BMI 22.98 kg/m2  General: Well developed, well nourished, in no acute distress. Head:  Normocephalic and atraumatic. Neck: no JVD Lungs: Clear to auscultation and percussion. Heart: Normal S1 and crisp VS.   No diastolic murmurs.   Abdomen:  Normal bowel sounds; soft; non tender; no organomegaly Pulses: Pulses normal in all 4 extremities. Extremities: No clubbing or cyanosis. No edema. Neurologic: Alert and oriented x 3.  EKG:  NSR.  First degree av block.  Leftward axis.  Delay in  R wave progression.  No acute changes.    ASSESSMENT AND PLAN:

## 2011-05-24 NOTE — Assessment & Plan Note (Signed)
Following with Dr. Tyrone Sage.  See CT report.

## 2011-05-24 NOTE — Assessment & Plan Note (Signed)
No CBC obtained at annual.  Will get since he is on warfarin.

## 2011-05-24 NOTE — Assessment & Plan Note (Signed)
Managed by Dr. Debby Bud.  Under good control.

## 2011-06-08 ENCOUNTER — Ambulatory Visit (INDEPENDENT_AMBULATORY_CARE_PROVIDER_SITE_OTHER): Payer: Medicare Other | Admitting: *Deleted

## 2011-06-08 DIAGNOSIS — Z954 Presence of other heart-valve replacement: Secondary | ICD-10-CM

## 2011-06-08 DIAGNOSIS — Z7901 Long term (current) use of anticoagulants: Secondary | ICD-10-CM

## 2011-06-08 LAB — POCT INR: INR: 2.5

## 2011-06-20 ENCOUNTER — Other Ambulatory Visit: Payer: Self-pay | Admitting: Internal Medicine

## 2011-07-15 ENCOUNTER — Other Ambulatory Visit: Payer: Self-pay | Admitting: Cardiology

## 2011-07-20 ENCOUNTER — Ambulatory Visit (INDEPENDENT_AMBULATORY_CARE_PROVIDER_SITE_OTHER): Payer: Medicare Other | Admitting: *Deleted

## 2011-07-20 DIAGNOSIS — Z7901 Long term (current) use of anticoagulants: Secondary | ICD-10-CM

## 2011-07-20 DIAGNOSIS — Z954 Presence of other heart-valve replacement: Secondary | ICD-10-CM

## 2011-08-31 ENCOUNTER — Ambulatory Visit (INDEPENDENT_AMBULATORY_CARE_PROVIDER_SITE_OTHER): Payer: Medicare Other | Admitting: Pharmacist

## 2011-08-31 DIAGNOSIS — Z7901 Long term (current) use of anticoagulants: Secondary | ICD-10-CM

## 2011-08-31 DIAGNOSIS — Z954 Presence of other heart-valve replacement: Secondary | ICD-10-CM

## 2011-09-22 ENCOUNTER — Other Ambulatory Visit: Payer: Self-pay | Admitting: Cardiology

## 2011-10-12 ENCOUNTER — Ambulatory Visit (INDEPENDENT_AMBULATORY_CARE_PROVIDER_SITE_OTHER): Payer: Medicare Other | Admitting: *Deleted

## 2011-10-12 DIAGNOSIS — Z7901 Long term (current) use of anticoagulants: Secondary | ICD-10-CM

## 2011-10-12 DIAGNOSIS — Z954 Presence of other heart-valve replacement: Secondary | ICD-10-CM

## 2011-11-23 ENCOUNTER — Ambulatory Visit (INDEPENDENT_AMBULATORY_CARE_PROVIDER_SITE_OTHER): Payer: Medicare Other | Admitting: Pharmacist

## 2011-11-23 DIAGNOSIS — Z954 Presence of other heart-valve replacement: Secondary | ICD-10-CM

## 2011-11-23 DIAGNOSIS — Z7901 Long term (current) use of anticoagulants: Secondary | ICD-10-CM

## 2011-12-02 ENCOUNTER — Other Ambulatory Visit: Payer: Self-pay | Admitting: *Deleted

## 2011-12-02 MED ORDER — AMOXICILLIN 500 MG PO CAPS: 500.0000 mg | ORAL_CAPSULE | ORAL | Status: DC | PRN

## 2011-12-23 ENCOUNTER — Other Ambulatory Visit: Payer: Self-pay | Admitting: Internal Medicine

## 2012-01-04 ENCOUNTER — Ambulatory Visit (INDEPENDENT_AMBULATORY_CARE_PROVIDER_SITE_OTHER): Payer: Medicare Other | Admitting: *Deleted

## 2012-01-04 DIAGNOSIS — Z954 Presence of other heart-valve replacement: Secondary | ICD-10-CM

## 2012-01-04 DIAGNOSIS — Z7901 Long term (current) use of anticoagulants: Secondary | ICD-10-CM

## 2012-01-04 LAB — POCT INR: INR: 2.1

## 2012-01-12 ENCOUNTER — Other Ambulatory Visit: Payer: Self-pay | Admitting: *Deleted

## 2012-01-12 DIAGNOSIS — I712 Thoracic aortic aneurysm, without rupture: Secondary | ICD-10-CM

## 2012-01-25 ENCOUNTER — Ambulatory Visit (INDEPENDENT_AMBULATORY_CARE_PROVIDER_SITE_OTHER): Payer: Medicare Other | Admitting: Pharmacist

## 2012-01-25 DIAGNOSIS — Z7901 Long term (current) use of anticoagulants: Secondary | ICD-10-CM

## 2012-01-25 DIAGNOSIS — Z954 Presence of other heart-valve replacement: Secondary | ICD-10-CM

## 2012-01-25 LAB — POCT INR: INR: 2.4

## 2012-01-27 ENCOUNTER — Other Ambulatory Visit: Payer: Self-pay

## 2012-01-30 MED ORDER — WARFARIN SODIUM 5 MG PO TABS: ORAL_TABLET | ORAL | Status: DC

## 2012-02-13 LAB — BUN: BUN: 29 mg/dL — ABNORMAL HIGH (ref 6–23)

## 2012-02-13 LAB — CREATININE, SERUM: Creat: 1.19 mg/dL (ref 0.50–1.35)

## 2012-02-16 ENCOUNTER — Encounter: Payer: Self-pay | Admitting: Cardiothoracic Surgery

## 2012-02-16 ENCOUNTER — Ambulatory Visit (INDEPENDENT_AMBULATORY_CARE_PROVIDER_SITE_OTHER): Payer: Medicare Other | Admitting: Cardiothoracic Surgery

## 2012-02-16 ENCOUNTER — Ambulatory Visit
Admission: RE | Admit: 2012-02-16 | Discharge: 2012-02-16 | Disposition: A | Discharge status: Home / Self Care | Payer: Medicare Other | Source: Ambulatory Visit | Attending: Cardiothoracic Surgery | Admitting: Cardiothoracic Surgery

## 2012-02-16 VITALS — BP 120/68 | HR 70 | Resp 18 | Ht 68.0 in | Wt 151.0 lb

## 2012-02-16 DIAGNOSIS — I712 Thoracic aortic aneurysm, without rupture: Secondary | ICD-10-CM

## 2012-02-16 MED ORDER — IOHEXOL 350 MG/ML SOLN
100.0000 mL | Freq: Once | INTRAVENOUS | Status: AC | PRN
  Administered 2012-02-16: 100 mL via INTRAVENOUS

## 2012-02-16 NOTE — Progress Notes (Signed)
[address]            [place] [address]          [phone]      David Benton [facility] Medical Record #[id number] Date of Birth: 10/17/1929  Referring: Herby Abraham, MD Primary Care: Illene Regulus, MD  Chief Complaint:    Chief Complaint  Patient presents with  . Follow-up    18 month f/u with CTA Chest, surveillance of ascending aortic aneurysm  . Thoracic Aortic Aneurysm    History of Present Illness:    Patient returns to the office today in followup after aortic valve replacement with a 29 mm St. Jude mechanical valve in August of 1991. He was seen in April of 2009 because of dilata on echocardiogram since thention of his  ascending aorta .  since that time he's had serial CT scans to evaluate the size of his aorta . 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 He continues on Coumadin without evidence of significant bleeding complication .   he returns today with a followup CT scan to compare to one previously done 18 months ago .  Current Activity/ Functional Status: Patient is  independent with mobility/ambulation, transfers, ADL's, IADL's.   Past Medical History  Diagnosis Date  . Shingles   . Thoracic aneurysm without mention of rupture   . Heart valve replaced by other means   . Aortic valve disorders   . Unspecified essential hypertension   . Other and unspecified hyperlipidemia   . Personal history of colonic polyps   . External hemorrhoids without mention of complication   . Other constipation   . Personal history of malignant neoplasm of prostate   . Personal history of other disorders of nervous system and sense organs   . Meniere's disease   . AVD (aortic valve disease) 02/25/2010  . Long term (current) use of anticoagulants 09/15/2010  . SHINGLES 11/26/2009    Past Surgical History  Procedure Date  . Aortic valve replacement     91 st. jude's valve   . Cholecystectomy   . Hemorrhoid surgery   . Prostatectomy 1995  . Tonsillectomy     Family History  Problem Relation Age of Onset  . Heart attack Father     mother  . Colon cancer Neg Hx   . Prostate cancer Brother   . Diabetes Brother     History   Social History  . Marital Status: Married    Spouse Name: N/A    Number of Children: N/A  . Years of Education: N/A   Occupational History  . retired    Social History Main Topics  . Smoking status: Former Smoker    Types: Cigarettes    Quit date: 02/06/1972  . Smokeless tobacco: Not on file  . Alcohol Use: Yes     Comment: glass of wine daily  . Drug Use: No  . Sexually Active: Not on file   Other Topics Concern  . Not on file   Social History Narrative   Paternal uncle history of -Prostate Ca.    History  Smoking status  . Former Smoker  . Types: Cigarettes  . Quit date: 02/06/1972  Smokeless tobacco  . Not on file    History  Alcohol Use  . Yes  Comment: glass of wine daily     Allergies  Allergen Reactions  . Codeine     REACTION: N/V    Current Outpatient Prescriptions  Medication Sig Dispense Refill  . acetaminophen (TYLENOL) 325 MG tablet Take 650 mg by mouth as needed.        Marland Kitchen amoxicillin (AMOXIL) 500 MG capsule Take 1 capsule (500 mg total) by mouth as needed. As directed per dental work as needed  12 capsule  1  . atorvastatin (LIPITOR) 40 MG tablet TAKE (1/2) TABLET DAILY.  15 tablet  2  . magnesium hydroxide (MILK OF MAGNESIA) 400 MG/5ML suspension Take by mouth daily as needed.        . TRIAMTERENE-HCTZ PO Take by mouth. 37.5-25 mg take 1 daily.       Marland Kitchen warfarin (COUMADIN) 5 MG tablet Take as directed by coumadin clinic  45 tablet  3  . ZETIA 10 MG tablet TAKE 1 TABLET EACH DAY.  30 each  9       Review of Systems:     Cardiac Review of Systems: Y or N  Chest Pain [  n ]  Resting SOB [n   ] Exertional SOB  [  n]  Orthopnea [name]  ]   Pedal Edema [ n  ]    Palpitations [n   ] Syncope  [name]  ]   Presyncope [ n  ]  General Review of Systems: [Y] = yes [  ]=no Constitional: recent weight change [  ]; anorexia [  ]; fatigue [  ]; nausea [  ]; night sweats [  ]; fever [  ]; or chills [  ];                                                                                                                                          Dental: poor dentition[ N ]; Last Dentist visit:[facility] 2013   Eye : blurred vision [  ]; diplopia [   ]; vision changes [  ];  Amaurosis fugax[  ]; Resp: cough [  ];  wheezing[  ];  hemoptysis[  ]; shortness of breath[  ]; paroxysmal nocturnal dyspnea[  ]; dyspnea on exertion[  ]; or orthopnea[  ];  GI:  gallstones[  ], vomiting[  ];  dysphagia[  ]; melena[  ];  hematochezia [  ]; heartburn[  ];   Hx of  Colonoscopy[ NOT NEEDED ANY MORE ]; GU: kidney stones [  ]; hematuria[  ];   dysuria [  ];  nocturia[  ];  history of     obstruction [  ];             Skin: rash, swelling[  ];, hair loss[  ];  peripheral edema[  ];  or itching[  ]; Musculosketetal: myalgias[  ];  joint swelling[  ];  joint erythema[  ];  joint pain[  ];  back pain[  ];  Heme/Lymph: bruising[  ];  bleeding[  ];  anemia[  ];  Neuro: TIA[  ];  headaches[  ];  stroke[  ];  vertigo[  ];  seizures[  ];   paresthesias[  ];  difficulty walking[  ];  Psych:depression[  ]; anxiety[  ];  Endocrine: diabetes[  ];  thyroid dysfunction[  ];  Immunizations: Flu [ y done at [facility]  2013 NOT RECORDED IN EPIC ]; Pneumococcal[ y IN [facility] 2005  ];  Other:  Physical Exam: BP 120/68  Pulse 70  Resp 18  Ht 5\' 8"  (1.727 m)  Wt 151 lb (68.493 kg)  BMI 22.96 kg/m2  SpO2 99%  General appearance: alert and cooperative Neurologic: intact Heart: regular rate and rhythm, S1, S2 normal, no murmur, click, rub or gallop and normal apical impulse Lungs: clear to auscultation bilaterally and normal percussion bilaterally Abdomen: soft, non-tender; bowel sounds normal; no masses,  no  organomegaly Extremities: extremities normal, atraumatic, no cyanosis or edema Wound: Sternum is stable   Diagnostic Studies & Laboratory data:     Recent Radiology Findings:   Ct Angio Chest W/cm &/or Wo Cm  02/16/2012  *RADIOLOGY REPORT*  Clinical Data: Thoracic aortic aneurysm  CT ANGIOGRAPHY CHEST  Technique:  Multidetector CT imaging of the chest using the standard protocol during bolus administration of intravenous contrast. Multiplanar reconstructed images including MIPs were obtained and reviewed to evaluate the vascular anatomy.  Contrast: OMNIPAQUE IOHEXOL 350 MG/ML SOLN  Comparison: 06/24/2010  Findings: Aneurysmal dilatation of the ascending aorta has slightly increased.  Diameters at the sinus of Valsalva, sinotubular junction, and ascending aorta are 4.4 cm, 4.3 cm, and 4.6 cm. Previously, maximal diameter in the sinus of Valsalva was 4.3 cm while the ascending aorta was 4.2 cm.  Aortic valve replacement hardware remains stable in position.  Mitral valve calcifications are noted coronary artery calcifications in the left main, left anterior descending, and circumflex arteries are noted.  Mild atherosclerotic changes at the origin of the left common carotid artery and left subclavian artery are present without significant narrowing.  Innominate artery and right subclavian artery are ectatic measuring 1.8 and 1.8 cm in caliber respectively.  Subclavian arteries are patent.  Visualized common carotid arteries are patent.  Vertebral arteries are patent.  Left vertebral is diminutive.  No obvious filling defect in the pulmonary arterial tree to suggest acute pulmonary thromboembolism.  No evidence of aortic transection or dissection.  Limited images of the abdomen demonstrate mild narrowing at the origin of the celiac axis with the appearance of median arcuate ligament syndrome.  SMA is patent.  Branch vessels of the celiac axis are grossly patent.  Atherosclerotic changes at the origin of  both renal arteries is present.  Degree of narrowing is difficult to determine secondary to calcifications.  No evidence of abnormal mediastinal, hilar, or axillary adenopathy. No pericardial effusion.  No pneumothorax.  No pleural effusion.  Diffuse centrilobular emphysema is not significantly changed.  No new mass or consolidation.  Stable parenchymal scarring at the lung apices.  Bilateral renal scarring.  Adrenal glands are within normal limits. New sub centimeter low density lesion in the right lobe of the liver has developed on image 131.  No destructive bone lesion.  T12 angioma is noted.  IMPRESSION: Aneurysmal dilatation has slightly increased.  Maximal diameter of the ascending aorta is 4.6 cm and was previously 4.2 cm.  Maximal diameter at the  sinus of Valsalva is 4.4 cm and was previously 4.3 cm.  New sub centimeter liver lesion in the right lobe has developed. It is benign appearing but because it is new, follow-up CT or MRI of the liver in 6 months is recommended to ensure stability.  It may have been obscured on prior studies secondary to phase of liver enhancement.   Original Report Authenticated By: Jolaine Click, M.D.       Recent Lab Findings: Lab Results  Component Value Date   WBC 4.0* 05/24/2011   HGB 13.8 05/24/2011   HCT 40.3 05/24/2011   PLT 193.0 05/24/2011   GLUCOSE 102* 01/20/2011   CHOL 137 01/20/2011   TRIG 66.0 01/20/2011   HDL 57.20 01/20/2011   LDLCALC 67 01/20/2011   ALT 32 01/20/2011   ALT 32 01/20/2011   AST 26 01/20/2011   AST 26 01/20/2011   NA 142 01/20/2011   K 4.4 01/20/2011   CL 105 01/20/2011   CREATININE 1.19 02/13/2012   BUN 29* 02/13/2012   CO2 31 01/20/2011   TSH 3.80 11/20/2009   INR 2.4 01/25/2012      Assessment / Plan:    1 status post replacement of aortic valve with 29 mm mechanical St. Jude . Mild aneurysmal dilatation of the ascending thoracic aorta which measures up to 4.5 cm in diameter on today's examination.  2.  New sub centimeter liver lesion in the right lobe  has developed. It is benign appearing but because it is new, follow-up CT or MRI of the liver in 6 months is recommended to ensure stability.    on review of the CT scan of the chest the ascending aorta appears basically stable still well under 5 cm in size . There is a suggestion of a new lesion in the right lobe of the liver probably benign . Radiology has recommended followup CT in 6 months , we will scan his chest and liver at the same time and I will see him back in 6 months . I discussed the findings with the patient who understands the need for followup . He continues to be diligent about his dental care and is aware of endocarditis precautions   he notes that he had a flu shot done at [facility] as far as the volunteer program but that immunization is not recorded in Epic     Delight Ovens MD  Beeper [phone] Office [phone] 02/16/2012 1:02 PM

## 2012-02-16 NOTE — Patient Instructions (Signed)
New sub centimeter liver lesion in the right lobe has developed. It is benign appearing but because it is new, follow-up CT or MRI of the liver in 6 months is recommended to ensure stability. It may have been obscured on prior studies secondary to phase of liver enhancement.  Ascending aorta is still about 4.4 com  Will get follow up scan in 6 months to check aorta and liver

## 2012-02-22 ENCOUNTER — Ambulatory Visit (INDEPENDENT_AMBULATORY_CARE_PROVIDER_SITE_OTHER): Payer: Medicare Other | Admitting: *Deleted

## 2012-02-22 DIAGNOSIS — Z7901 Long term (current) use of anticoagulants: Secondary | ICD-10-CM

## 2012-02-22 DIAGNOSIS — Z954 Presence of other heart-valve replacement: Secondary | ICD-10-CM

## 2012-02-22 LAB — POCT INR: INR: 2.8

## 2012-02-29 ENCOUNTER — Encounter: Payer: Medicare Other | Admitting: Cardiothoracic Surgery

## 2012-03-06 ENCOUNTER — Ambulatory Visit (INDEPENDENT_AMBULATORY_CARE_PROVIDER_SITE_OTHER): Payer: Medicare Other | Admitting: Internal Medicine

## 2012-03-06 ENCOUNTER — Other Ambulatory Visit (INDEPENDENT_AMBULATORY_CARE_PROVIDER_SITE_OTHER): Payer: Medicare Other

## 2012-03-06 ENCOUNTER — Encounter: Payer: Self-pay | Admitting: Internal Medicine

## 2012-03-06 VITALS — BP 118/66 | HR 72 | Temp 97.9°F | Resp 10 | Ht 70.0 in | Wt 148.0 lb

## 2012-03-06 DIAGNOSIS — I10 Essential (primary) hypertension: Secondary | ICD-10-CM

## 2012-03-06 DIAGNOSIS — I359 Nonrheumatic aortic valve disorder, unspecified: Secondary | ICD-10-CM

## 2012-03-06 DIAGNOSIS — E785 Hyperlipidemia, unspecified: Secondary | ICD-10-CM

## 2012-03-06 DIAGNOSIS — Z0000 Encounter for general adult medical examination without abnormal findings: Secondary | ICD-10-CM

## 2012-03-06 DIAGNOSIS — I712 Thoracic aortic aneurysm, without rupture: Secondary | ICD-10-CM

## 2012-03-06 DIAGNOSIS — Z86010 Personal history of colonic polyps: Secondary | ICD-10-CM

## 2012-03-06 LAB — HEPATIC FUNCTION PANEL
Bilirubin, Direct: 0.3 mg/dL (ref 0.0–0.3)
Total Bilirubin: 2 mg/dL — ABNORMAL HIGH (ref 0.3–1.2)

## 2012-03-06 LAB — COMPREHENSIVE METABOLIC PANEL
Albumin: 4.3 g/dL (ref 3.5–5.2)
BUN: 22 mg/dL (ref 6–23)
Calcium: 9.5 mg/dL (ref 8.4–10.5)
Chloride: 104 mEq/L (ref 96–112)
Glucose, Bld: 104 mg/dL — ABNORMAL HIGH (ref 70–99)
Potassium: 4.2 mEq/L (ref 3.5–5.1)

## 2012-03-06 LAB — LIPID PANEL
Cholesterol: 124 mg/dL (ref 0–200)
VLDL: 11.2 mg/dL (ref 0.0–40.0)

## 2012-03-06 MED ORDER — ATORVASTATIN CALCIUM 20 MG PO TABS: 20.0000 mg | ORAL_TABLET | Freq: Every day | ORAL | Status: DC

## 2012-03-06 NOTE — Assessment & Plan Note (Signed)
Very stable and doing well. Continues on anti-coagulation on warfarin w/o complications

## 2012-03-06 NOTE — Assessment & Plan Note (Signed)
Last CT angio Jan 30 , 2014 - minmal increase in size by 0.2 cm.  Plan F/u CT angio July '14

## 2012-03-06 NOTE — Assessment & Plan Note (Signed)
Tolerating medication well with a history of good control.  Plan  Follow up lab with recommendations to follow

## 2012-03-06 NOTE — Assessment & Plan Note (Signed)
Current with colonoscopy. Follow per Dr. Marina Goodell.

## 2012-03-06 NOTE — Patient Instructions (Addendum)
Thanks for coming in.  You look like you are doing fine.   Will get routine labs today with results to be included in the report you receive.   Come back in a year, sooner as needed.

## 2012-03-06 NOTE — Progress Notes (Signed)
Subjective:    Patient ID: David Benton, male    DOB: [birth year], 79 y.o.   MRN: [id number]  HPI The patient is here for annual Medicare wellness examination and management of other chronic and acute problems.  Interval history is unremarkable. Follow up CT angio revealed minimal change in anuerysm with follow up in 6 months scheduled. Otherwise no major illness, no surgery and injury.   The risk factors are reflected in the social history.  The roster of all physicians providing medical care to patient - is listed in the Snapshot section of the chart.  Activities of daily living:  The patient is 100% inedpendent in all ADLs: dressing, toileting, feeding as well as independent mobility  Home safety : The patient has smoke detectors in the home. Falls - none. Home is fall safe, except grab bars in bathroom. They wear seatbelts.No firearms at home  There is no risks for hepatitis, STDs or HIV. There is no   history of blood transfusion. They have no travel history to infectious disease endemic areas of the world.  The patient has seen their dentist in the last six month. They have seen their eye doctor in the last year. They deny any hearing difficulty and have not had audiologic testing in the last year.    They do not  have excessive sun exposure. Discussed the need for sun protection: hats, long sleeves and use of sunscreen if there is significant sun exposure.   Diet: the importance of a healthy diet is discussed. They do have a healthy diet.  The patient has a regular exercise program: golf/walking ,  30 min. duration, 5 per week.  Does 10 k WALKS. The benefits of regular aerobic exercise were discussed.  Depression screen: there are no signs or vegative symptoms of depression- irritability, change in appetite, anhedonia, sadness/tearfullness.  Cognitive assessment: the patient manages all their financial and personal affairs and is actively engaged.   The following portions of  the patient's history were reviewed and updated as appropriate: allergies, current medications, past family history, past medical history,  past surgical history, past social history  and problem list.  Past Medical History  Diagnosis Date  . Shingles   . Thoracic aneurysm without mention of rupture   . Heart valve replaced by other means   . Aortic valve disorders   . Unspecified essential hypertension   . Other and unspecified hyperlipidemia   . Personal history of colonic polyps   . External hemorrhoids without mention of complication   . Other constipation   . Personal history of malignant neoplasm of prostate   . Personal history of other disorders of nervous system and sense organs   . Meniere's disease   . AVD (aortic valve disease) 02/25/2010  . Long term (current) use of anticoagulants 09/15/2010  . SHINGLES 11/26/2009   Past Surgical History  Procedure Laterality Date  . Aortic valve replacement      91 st. jude's valve  . Hemorrhoid surgery    . Prostatectomy  1995  . Tonsillectomy     Family History  Problem Relation Age of Onset  . Heart attack Father     mother  . Colon cancer Neg Hx   . Prostate cancer Brother   . Diabetes Brother   . Stroke Brother   . Heart disease Brother     carotid artery disease  . Hypertension Brother   . Cancer Sister     breast cancer; mastectomy at 72  History   Social History  . Marital Status: Married    Spouse Name: N/A    Number of Children: 0  . Years of Education: 13   Occupational History  . retired   .     Social History Main Topics  . Smoking status: Former Smoker    Types: Cigarettes    Quit date: 02/06/1972  . Smokeless tobacco: Never Used  . Alcohol Use: 3.5 oz/week    7 drink(s) per week     Comment: glass of wine daily  . Drug Use: No  . Sexually Active: No   Other Topics Concern  . Not on file   Social History Narrative   HSG, a couple of years of college. Married '78 - . No children. Work -  [employer] 44 years - retired.   Lives alone with wife and two dogs. ACP - Yes CPR, Yes - short-term mechanical ventilation. Does not want prolonged heroic measures in the face of irreversible disease. HCPOA - wife. Alternative is his brother: David Benton [facility].     Current Outpatient Prescriptions on File Prior to Visit  Medication Sig Dispense Refill  . acetaminophen (TYLENOL) 325 MG tablet Take 650 mg by mouth as needed.        Marland Kitchen amoxicillin (AMOXIL) 500 MG capsule Take 1 capsule (500 mg total) by mouth as needed. As directed per dental work as needed  12 capsule  1  . magnesium hydroxide (MILK OF MAGNESIA) 400 MG/5ML suspension Take by mouth daily as needed.        . TRIAMTERENE-HCTZ PO Take by mouth. 37.5-25 mg take 1 daily.       Marland Kitchen warfarin (COUMADIN) 5 MG tablet Take as directed by coumadin clinic  45 tablet  3  . ZETIA 10 MG tablet TAKE 1 TABLET EACH DAY.  30 each  9   No current facility-administered medications on file prior to visit.      Vision, hearing, body mass index were assessed and reviewed.   During the course of the visit the patient was educated and counseled about appropriate screening and preventive services including : fall prevention , diabetes screening, nutrition counseling, colorectal cancer screening, and recommended immunizations.    Review of Systems Constitutional:  Negative for fever, chills, activity change and unexpected weight change.  HEENT:  Negative for hearing loss, ear pain, congestion, neck stiffness and postnasal drip. Negative for sore throat or swallowing problems. Negative for dental complaints.   Eyes: Negative for vision loss or change in visual acuity.  Respiratory: Negative for chest tightness and wheezing. Negative for DOE.   Cardiovascular: Negative for chest pain or palpitations. No decreased exercise tolerance Gastrointestinal: No change in bowel habit. No bloating or gas. No reflux or indigestion Genitourinary: Negative  for urgency, frequency, flank pain and difficulty urinating.  Musculoskeletal: Negative for myalgias, back pain, arthralgias and gait problem.  Neurological: Negative for dizziness, weakness and headaches. Intermittent tremor Hematological: Negative for adenopathy.  Psychiatric/Behavioral: Negative for behavioral problems and dysphoric mood.       Objective:   Physical Exam Filed Vitals:   03/06/12 0852  BP: 118/66  Pulse: 72  Temp: 97.9 F (36.6 C)  Resp: 10   Wt Readings from Last 3 Encounters:  03/06/12 148 lb (67.132 kg)  02/16/12 151 lb (68.493 kg)  05/24/11 151 lb 1.9 oz (68.548 kg)   Gen'l: Well nourished well developed  White male in no acute distress  HEENT: Head: Normocephalic  and atraumatic. Right Ear: External ear normal. EAC/TM nl. Left Ear: External ear normal.  EAC/TM nl. Nose: Nose normal. Mouth/Throat: Oropharynx is clear and moist. Dentition - native, in good repair. No buccal or palatal lesions. Posterior pharynx clear. Eyes: Conjunctivae and sclera clear. EOM intact. Pupils are equal, round, and reactive to light. Right eye exhibits no discharge. Left eye exhibits no discharge. Neck: Normal range of motion. Neck supple. No JVD present. No tracheal deviation present. No thyromegaly present.  Cardiovascular: Normal rate, regular rhythm, no gallop, no friction rub. Mechanical valve click best at RSB but heard across precordium      Quiet precordium. 2+ radial and DP pulses . No carotid bruits Pulmonary/Chest: Effort normal. No respiratory distress or increased WOB, no wheezes, no rales. No chest wall deformity or CVAT. Well healed sternotomy scar Abdomen: Soft. Bowel sounds are normal in all quadrants. He exhibits no distension, no tenderness, no rebound or guarding, No heptosplenomegaly  Genitourinary:  deferred Musculoskeletal: Normal range of motion. He exhibits no edema and no tenderness.       Small and large joints without redness, synovial thickening or  deformity. Full range of motion preserved about all small, median and large joints.  Lymphadenopathy:    He has no cervical or supraclavicular adenopathy.  Neurological: He is alert and oriented to person, place, and time. CN II-XII intact. DTRs 2+ and symmetrical biceps, radial and patellar tendons. Cerebellar function normal with no tremor, rigidity, normal gait and station.  Skin: Skin is warm and dry. No rash noted. No erythema. Onychomycosis toe nails. Psychiatric: He has a normal mood and affect. His behavior is normal. Thought content normal.   Lab Results  Component Value Date   WBC 4.0* 05/24/2011   HGB 13.8 05/24/2011   HCT 40.3 05/24/2011   PLT 193.0 05/24/2011   GLUCOSE 104* 03/06/2012   CHOL 124 03/06/2012   TRIG 56.0 03/06/2012   HDL 47.90 03/06/2012   LDLCALC 65 03/06/2012        ALT 31 03/06/2012   AST 27 03/06/2012        NA 140 03/06/2012   K 4.2 03/06/2012   CL 104 03/06/2012   CREATININE 1.0 03/06/2012   BUN 22 03/06/2012   CO2 31 03/06/2012   TSH 3.80 11/20/2009   PSA 0.01* 11/20/2009   INR 2.8 02/22/2012         Assessment & Plan:

## 2012-03-06 NOTE — Assessment & Plan Note (Signed)
BP Readings from Last 3 Encounters:  03/06/12 118/66  02/16/12 120/68  05/24/11 134/72   Excellent control  Plan Continue present medication  Routine lab follow up: Bmet

## 2012-03-06 NOTE — Assessment & Plan Note (Signed)
Interval history is negative and stable. Physical exam is normal. He is current with colorectal cancer screening. He has been released from follow up by Dr. Annabell Howells for urology. Immunizations are up to date.

## 2012-03-28 ENCOUNTER — Other Ambulatory Visit: Payer: Self-pay | Admitting: *Deleted

## 2012-03-28 ENCOUNTER — Ambulatory Visit (INDEPENDENT_AMBULATORY_CARE_PROVIDER_SITE_OTHER): Payer: Medicare Other | Admitting: *Deleted

## 2012-03-28 DIAGNOSIS — Z954 Presence of other heart-valve replacement: Secondary | ICD-10-CM

## 2012-03-28 MED ORDER — ATORVASTATIN CALCIUM 20 MG PO TABS: 20.0000 mg | ORAL_TABLET | Freq: Every day | ORAL | Status: DC

## 2012-05-09 ENCOUNTER — Ambulatory Visit (INDEPENDENT_AMBULATORY_CARE_PROVIDER_SITE_OTHER): Payer: Medicare Other | Admitting: *Deleted

## 2012-05-09 DIAGNOSIS — Z954 Presence of other heart-valve replacement: Secondary | ICD-10-CM

## 2012-05-09 DIAGNOSIS — Z7901 Long term (current) use of anticoagulants: Secondary | ICD-10-CM

## 2012-05-09 LAB — POCT INR: INR: 2.9

## 2012-05-22 ENCOUNTER — Encounter: Payer: Self-pay | Admitting: Cardiology

## 2012-05-22 ENCOUNTER — Ambulatory Visit (INDEPENDENT_AMBULATORY_CARE_PROVIDER_SITE_OTHER): Payer: Medicare Other | Admitting: Cardiology

## 2012-05-22 ENCOUNTER — Other Ambulatory Visit: Payer: Self-pay | Admitting: *Deleted

## 2012-05-22 VITALS — BP 134/70 | HR 66 | Ht 70.0 in | Wt 151.0 lb

## 2012-05-22 DIAGNOSIS — Z954 Presence of other heart-valve replacement: Secondary | ICD-10-CM

## 2012-05-22 DIAGNOSIS — I712 Thoracic aortic aneurysm, without rupture: Secondary | ICD-10-CM

## 2012-05-22 DIAGNOSIS — I359 Nonrheumatic aortic valve disorder, unspecified: Secondary | ICD-10-CM

## 2012-05-22 MED ORDER — EZETIMIBE 10 MG PO TABS: 10.0000 mg | ORAL_TABLET | Freq: Every day | ORAL | Status: DC

## 2012-05-22 NOTE — Patient Instructions (Addendum)
Start aspirin 81mg  daily.   Your physician has requested that you have an echocardiogram. Echocardiography is a painless test that uses sound waves to create images of your heart. It provides your doctor with information about the size and shape of your heart and how well your heart's chambers and valves are working. This procedure takes approximately one hour. There are no restrictions for this procedure.  Your physician wants you to follow-up in: 6 months with Dr Shirlee Latch. (November 2014). You will receive a reminder letter in the mail two months in advance. If you don't receive a letter, please call our office to schedule the follow-up appointment.

## 2012-05-23 NOTE — Progress Notes (Signed)
Patient ID: David Benton, male   DOB: November 14, 1929, 79 y.o.   MRN: [id number] PCP: Dr. Debby Bud  79 yo with history of St Jude mechanical aortic valve and thoracic aortic aneurysm presents for cardiology followup.  He has been seen by Dr. Riley Kill in the past and is seen by me for the first time today.  He has been doing well recently.  No exertional dyspnea or chest pain.  He walks a 10K once a week with no problems.  He golfs occasionally.  Last echo in 9/08 showed well-seated mechanical aortic valve.  He has had no bleeding problems on warfarin.   ECG: NSR, 1st degree AV block, LAE, poor anterior R wave progression  Labs (2/14): LDL 65, HDL 48, K 4.2, creatinine 1.1  PMH: 1. Prostate cancer s/p prostectomy in 1995 2. Meniere's disease 3. Shingles 4. St Jude mechanical aortic valve #29 placed in 1991.  Goal INR has been 2.5-3.5.  Echo (9/08) withi EF 60%, normal mechanical aortic valve.  5. H/o CCY 6. Hyperlipidemia 7. Thoracic aortic aneurysm: ascending aorta 4.5 cm by CTA in 1/14.  Followed by Dr. Tyrone Sage.   FH:  Father with MI  SH: Married, prior smoker.  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  Lives in [place].  ROS: All systems reviewed and negative except as per HPI.   Current Outpatient Prescriptions  Medication Sig Dispense Refill  . acetaminophen (TYLENOL) 325 MG tablet Take 650 mg by mouth as needed.        Marland Kitchen amoxicillin (AMOXIL) 500 MG capsule Take 1 capsule (500 mg total) by mouth as needed. As directed per dental work as needed  12 capsule  1  . atorvastatin (LIPITOR) 20 MG tablet Take 1 tablet (20 mg total) by mouth daily.  30 tablet  5  . magnesium hydroxide (MILK OF MAGNESIA) 400 MG/5ML suspension Take by mouth daily as needed.        . TRIAMTERENE-HCTZ PO Take by mouth. 37.5-25 mg take 1 daily.       Marland Kitchen warfarin (COUMADIN) 5 MG tablet Take as directed by coumadin clinic  45 tablet  3  . aspirin EC 81 MG tablet Take 1 tablet (81 mg  total) by mouth daily.      Marland Kitchen ezetimibe (ZETIA) 10 MG tablet Take 1 tablet (10 mg total) by mouth daily.  30 tablet  6   No current facility-administered medications for this visit.    BP 134/70  Pulse 66  Ht 5\' 10"  (1.778 m)  Wt 151 lb (68.493 kg)  BMI 21.67 kg/m2 General: NAD Neck: No JVD, no thyromegaly or thyroid nodule.  Lungs: Clear to auscultation bilaterally with normal respiratory effort. CV: Nondisplaced PMI.  Heart regular S1/S2, no S3/S4, mechanical S2, 1/6 SEM RUSB.  No peripheral edema.  No carotid bruit.  Normal pedal pulses.  Abdomen: Soft, nontender, no hepatosplenomegaly, no distention.   Neurologic: Alert and oriented x 3.  Psych: Normal affect. Extremities: No clubbing or cyanosis.   Assessment/Plan: 1. Mechanical aortic valve: No particular complaints.  Crisp valve sound.  He is on warfarin.  By current recommendations, he also should be on ASA 81 mg daily.  As he has had no prior bleeding sequelae, I think this would be reasonable for him, so I asked him to start ASA 81.  He has not had echo to follow the valve in about 6 years.  I will go ahead and get an echocardiogram to assess  the [facility] Jude valve.  2. Thoracic aortic aneurysm: 4.5 cm ascending aortic aneurysm in 1/14.  Follows regularly with Dr. Tyrone Sage.  BP is controlled.   Marca Ancona 77/07/2012

## 2012-06-04 ENCOUNTER — Ambulatory Visit (HOSPITAL_COMMUNITY): Payer: Medicare Other | Attending: Cardiology | Admitting: Radiology

## 2012-06-04 DIAGNOSIS — E785 Hyperlipidemia, unspecified: Secondary | ICD-10-CM | POA: Insufficient documentation

## 2012-06-04 DIAGNOSIS — I359 Nonrheumatic aortic valve disorder, unspecified: Secondary | ICD-10-CM

## 2012-06-04 DIAGNOSIS — I079 Rheumatic tricuspid valve disease, unspecified: Secondary | ICD-10-CM | POA: Insufficient documentation

## 2012-06-04 DIAGNOSIS — I080 Rheumatic disorders of both mitral and aortic valves: Secondary | ICD-10-CM | POA: Insufficient documentation

## 2012-06-04 DIAGNOSIS — Z87891 Personal history of nicotine dependence: Secondary | ICD-10-CM | POA: Insufficient documentation

## 2012-06-04 NOTE — Progress Notes (Signed)
Echocardiogram performed.  

## 2012-06-13 ENCOUNTER — Other Ambulatory Visit: Payer: Self-pay

## 2012-06-13 MED ORDER — WARFARIN SODIUM 5 MG PO TABS: ORAL_TABLET | ORAL | Status: DC

## 2012-06-20 ENCOUNTER — Ambulatory Visit (INDEPENDENT_AMBULATORY_CARE_PROVIDER_SITE_OTHER): Payer: Medicare Other | Admitting: *Deleted

## 2012-06-20 DIAGNOSIS — Z7901 Long term (current) use of anticoagulants: Secondary | ICD-10-CM

## 2012-06-20 DIAGNOSIS — Z954 Presence of other heart-valve replacement: Secondary | ICD-10-CM

## 2012-06-29 ENCOUNTER — Other Ambulatory Visit: Payer: Self-pay | Admitting: *Deleted

## 2012-06-29 DIAGNOSIS — I712 Thoracic aortic aneurysm, without rupture: Secondary | ICD-10-CM

## 2012-08-01 ENCOUNTER — Ambulatory Visit (INDEPENDENT_AMBULATORY_CARE_PROVIDER_SITE_OTHER): Payer: Medicare Other | Admitting: *Deleted

## 2012-08-01 DIAGNOSIS — Z7901 Long term (current) use of anticoagulants: Secondary | ICD-10-CM

## 2012-08-01 DIAGNOSIS — Z954 Presence of other heart-valve replacement: Secondary | ICD-10-CM

## 2012-08-11 LAB — CREATININE, SERUM: Creat: 1.13 mg/dL (ref 0.50–1.35)

## 2012-08-11 LAB — BUN: BUN: 21 mg/dL (ref 6–23)

## 2012-08-14 ENCOUNTER — Ambulatory Visit
Admission: RE | Admit: 2012-08-14 | Discharge: 2012-08-14 | Disposition: A | Discharge status: Home / Self Care | Payer: Medicare Other | Source: Ambulatory Visit | Attending: Cardiothoracic Surgery | Admitting: Cardiothoracic Surgery

## 2012-08-14 ENCOUNTER — Ambulatory Visit (INDEPENDENT_AMBULATORY_CARE_PROVIDER_SITE_OTHER): Payer: Medicare Other | Admitting: Cardiothoracic Surgery

## 2012-08-14 ENCOUNTER — Encounter: Payer: Self-pay | Admitting: Cardiothoracic Surgery

## 2012-08-14 VITALS — BP 109/66 | HR 69 | Resp 20 | Ht 70.0 in | Wt 150.0 lb

## 2012-08-14 DIAGNOSIS — I712 Thoracic aortic aneurysm, without rupture: Secondary | ICD-10-CM

## 2012-08-14 DIAGNOSIS — K7689 Other specified diseases of liver: Secondary | ICD-10-CM

## 2012-08-14 DIAGNOSIS — K769 Liver disease, unspecified: Secondary | ICD-10-CM

## 2012-08-14 MED ORDER — IOHEXOL 350 MG/ML SOLN
80.0000 mL | Freq: Once | INTRAVENOUS | Status: AC | PRN
  Administered 2012-08-14: 80 mL via INTRAVENOUS

## 2012-08-14 NOTE — Progress Notes (Signed)
[address]       [place] [address]             [phone]               David Benton [facility] Medical Record #[id number] Date of Birth: [birth year]  Referring: Herby Abraham, MD, now Dr Golden Circle Primary Care: Illene Regulus, MD  Chief Complaint:    Chief Complaint  Patient presents with  . Follow-up    6 month f/u with Chest CT    History of Present Illness:    Patient returns to the office today in followup after aortic valve replacement with a 29 mm St. Jude mechanical valve (29A-101  SN [id number]) in August of 1991 by Dr Andrey Campanile for aortic stenosis and bicuspid aortic valve. He was seen in April of 2009 because of aortic dilation on echocardiogram.  Since that time he's had serial CT scans to evaluate the size of his aorta . 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 He continues on Coumadin without evidence of significant bleeding complication .  On his CT scan 6 months ago the radiologist raised the issue of a liver lesion, so he returned 6 months early for followup scan both of the liver and aorta. Since last seen the patient's been doing well without any change in symptoms, denies heart flair or angina.  Current Activity/ Functional Status: Patient is  independent with mobility/ambulation, transfers, ADL's, IADL's.   Past Medical History  Diagnosis Date  . Shingles   . Thoracic aneurysm without mention of rupture   . Heart valve replaced by other means   . Aortic valve disorders   . Unspecified essential hypertension   . Other and unspecified hyperlipidemia   . Personal history of colonic polyps   . External hemorrhoids without mention of complication   . Other constipation   . Personal history of malignant neoplasm of prostate   . Personal history of other disorders of nervous system and sense organs   . Meniere's disease   . AVD (aortic valve disease) 02/25/2010  . Long term  (current) use of anticoagulants 09/15/2010  . SHINGLES 11/26/2009    Past Surgical History  Procedure Laterality Date  . Aortic valve replacement      91 st. jude's valve  . Hemorrhoid surgery    . Prostatectomy  1995  . Tonsillectomy      Family History  Problem Relation Age of Onset  . Heart attack Father     mother  . Colon cancer Neg Hx   . Prostate cancer Brother   . Diabetes Brother   . Stroke Brother   . Heart disease Brother     carotid artery disease  . Hypertension Brother   . Cancer Sister     breast cancer; mastectomy at 18    History   Social History  . Marital Status: Married    Spouse Name: N/A    Number of Children: 0  . Years of Education: 13   Occupational History  . retired   .     Social History Main Topics  . Smoking status: Former Smoker    Types: Cigarettes    Quit date: 02/06/1972  . Smokeless tobacco: Never Used  . Alcohol Use: 3.5 oz/week    7 drink(s) per week     Comment: glass of wine daily  . Drug  Use: No  . Sexually Active: No   Other Topics Concern  . Not on file   Social History Narrative   HSG, a couple of years of college. Married '78 - . No children. Work - [employer] 44 years - retired.   Lives alone with wife and two dogs. ACP - Yes CPR, Yes - short-term mechanical ventilation. Does not want prolonged heroic measures in the face of irreversible disease. HCPOA - wife. Alternative is his brother: David Benton [facility].     History  Smoking status  . Former Smoker  . Types: Cigarettes  . Quit date: 02/06/1972  Smokeless tobacco  . Never Used    History  Alcohol Use  . 3.5 oz/week  . 7 drink(s) per week    Comment: glass of wine daily     Allergies  Allergen Reactions  . Codeine     REACTION: N/V    Current Outpatient Prescriptions  Medication Sig Dispense Refill  . acetaminophen (TYLENOL) 325 MG tablet Take 650 mg by mouth as needed.        Marland Kitchen amoxicillin (AMOXIL) 500 MG capsule Take 1 capsule  (500 mg total) by mouth as needed. As directed per dental work as needed  12 capsule  1  . aspirin EC 81 MG tablet Take 1 tablet (81 mg total) by mouth daily.      Marland Kitchen atorvastatin (LIPITOR) 20 MG tablet Take 1 tablet (20 mg total) by mouth daily.  30 tablet  5  . ezetimibe (ZETIA) 10 MG tablet Take 1 tablet (10 mg total) by mouth daily.  30 tablet  6  . magnesium hydroxide (MILK OF MAGNESIA) 400 MG/5ML suspension Take by mouth daily as needed.        . TRIAMTERENE-HCTZ PO Take by mouth. 37.5-25 mg take 1 daily.       Marland Kitchen warfarin (COUMADIN) 5 MG tablet Take as directed by coumadin clinic  45 tablet  3   No current facility-administered medications for this visit.       Review of Systems:     Cardiac Review of Systems: Y or N  Chest Pain [  n ]  Resting SOB [n   ] Exertional SOB  [  n]  Orthopnea [name]  ]   Pedal Edema [ n  ]    Palpitations [n  ] Syncope  [name]  ]   Presyncope [ n  ]  General Review of Systems: [Y] = yes [  ]=no Constitional: recent weight change [  ]; anorexia [  ]; fatigue [  ]; nausea [  ]; night sweats [  ]; fever [  ]; or chills [  ];  Dental: poor dentition[ N ]; Last Dentist visit:FALL 2013   Eye : blurred vision [  ]; diplopia [   ]; vision changes [  ];  Amaurosis fugax[  ]; Resp: cough [  ];  wheezing[  ];  hemoptysis[  ]; shortness of breath[  ]; paroxysmal nocturnal dyspnea[  ]; dyspnea on exertion[  ]; or orthopnea[  ];  GI:  gallstones[  ], vomiting[  ];  dysphagia[  ]; melena[  ];  hematochezia [  ]; heartburn[  ];   Hx of  Colonoscopy[ NOT NEEDED ANY MORE ]; GU: kidney stones [  ]; hematuria[  ];   dysuria [  ];  nocturia[  ];  history of     obstruction [  ];             Skin: rash, swelling[  ];, hair loss[  ];  peripheral edema[  ];  or itching[  ]; Musculosketetal: myalgias[  ];  joint swelling[  ];  joint erythema[  ];  joint  pain[  ];  back pain[  ];  Heme/Lymph: bruising[  ];  bleeding[  ];  anemia[  ];  Neuro: TIA[  ];  headaches[  ];  stroke[  ];  vertigo[  ];  seizures[  ];   paresthesias[  ];  difficulty walking[  ];  Psych:depression[  ]; anxiety[  ];  Endocrine: diabetes[  ];  thyroid dysfunction[  ];  Immunizations: Flu [ y  ]; Pneumococcal[ y IN [place] Ramiro.Hopkins  ];  Other:  Physical Exam: BP 109/66  Pulse 69  Resp 20  Ht 5\' 10"  (1.778 m)  Wt 150 lb (68.04 kg)  BMI 21.52 kg/m2  SpO2 95%  General appearance: alert and cooperative Neurologic: intact Heart: regular rate and rhythm, S1, S2 normal, no murmur, crisp valve sound click no murmur of aortic insufficiency, rub or gallop and normal apical impulse Lungs: clear to auscultation bilaterally and normal percussion bilaterally Abdomen: soft, non-tender; bowel sounds normal; no masses,  no organomegaly Extremities: extremities normal, atraumatic, no cyanosis or edema Wound: Sternum is stable   Diagnostic Studies & Laboratory data:     Recent Radiology Findings:  Ct Angio Chest Aorta W/cm &/or Wo/cm  08/14/2012   *RADIOLOGY REPORT*  Clinical Data:  108-month follow-up thoracic aortic aneurysm and liver lesion.  CT ANGIOGRAPHY CHEST  Technique:  Multidetector CT imaging of the chest using the standard protocol during bolus administration of intravenous contrast. Multiplanar reconstructed images including MIPs were obtained and reviewed to evaluate the vascular anatomy.  Contrast: 80mL OMNIPAQUE IOHEXOL 350 MG/ML SOLN  Comparison: Prior CTA of the chest 02/16/2012  Findings:  Mediastinum: Unremarkable CT appearance of the thyroid gland.  No suspicious mediastinal or hilar adenopathy.  No soft tissue mediastinal mass.  The thoracic esophagus is unremarkable.  Heart/Vascular: Surgical changes of mechanical aortic valve repair. There is persistent dilatation of the aortic root up to 4.3 cm at the sinuses of Valsalva.  This is not significantly changed compared to  prior.  The aorta measures approximately 4.2 cm at the sinotubular junction.  The ascending aorta is aneurysmally dilated with a maximal diameter of 4.6 cm, unchanged compared to prior. The transverse aorta measures 3.4 cm.  The proximal descending thoracic aorta measures 3.6 cm.  Scattered atherosclerotic vascular calcifications.  Extensive atherosclerotic calcifications noted throughout the coronary arteries. Mild ectasia of the right subclavian artery to 1.6 cm. Stable borderline cardiomegaly.  Lungs/Pleura: Trace biapical pleural parenchymal scarring.  Trace dependent atelectasis.  The lungs are otherwise clear.  Upper Abdomen: Stable 6 - 7 mm hypoattenuating lesion in the posterior aspect of hepatic segments six.  No significant interval growth.  Sludge and/or small stones layer within the gallbladder lumen.  Bilateral renal cortical thinning with scattered areas of scarring.  Otherwise, unremarkable upper abdomen.  Bones: No acute fracture or aggressive appearing lytic or blastic osseous lesion.  T12 hemangioma.  Healed median sternotomy.  IMPRESSION:  1.  Stable ascending aortic aneurysm with maximal diameter of the ascending tubular segment at 4.6 cm and  maximal diameter of the sinotubular junction 4.3 cm.  2.  Stable sub centimeter hypoattenuating lesion in the posterior right hepatic lobe.  In retrospect, this finding was likely present in 2012 (although it cannot be definitively identified on studies from 2010 or 2009) and is very likely benign. Recommend continued attention on routine follow-up imaging.  3.  Sludge and / or small stones in the gallbladder lumen.  4.  Atherosclerosis including multivessel coronary artery disease.  Signed,  Sterling Big, MD Vascular & Interventional Radiologist [facility]iology   Original Report Authenticated By: Malachy Moan, M.D.     Ct Angio Chest W/cm &/or Wo Cm  02/16/2012  *RADIOLOGY REPORT*  Clinical Data: Thoracic aortic aneurysm  CT ANGIOGRAPHY  CHEST  Technique:  Multidetector CT imaging of the chest using the standard protocol during bolus administration of intravenous contrast. Multiplanar reconstructed images including MIPs were obtained and reviewed to evaluate the vascular anatomy.  Contrast: OMNIPAQUE IOHEXOL 350 MG/ML SOLN  Comparison: 06/24/2010  Findings: Aneurysmal dilatation of the ascending aorta has slightly increased.  Diameters at the sinus of Valsalva, sinotubular junction, and ascending aorta are 4.4 cm, 4.3 cm, and 4.6 cm. Previously, maximal diameter in the sinus of Valsalva was 4.3 cm while the ascending aorta was 4.2 cm.  Aortic valve replacement hardware remains stable in position.  Mitral valve calcifications are noted coronary artery calcifications in the left main, left anterior descending, and circumflex arteries are noted.  Mild atherosclerotic changes at the origin of the left common carotid artery and left subclavian artery are present without significant narrowing.  Innominate artery and right subclavian artery are ectatic measuring 1.8 and 1.8 cm in caliber respectively.  Subclavian arteries are patent.  Visualized common carotid arteries are patent.  Vertebral arteries are patent.  Left vertebral is diminutive.  No obvious filling defect in the pulmonary arterial tree to suggest acute pulmonary thromboembolism.  No evidence of aortic transection or dissection.  Limited images of the abdomen demonstrate mild narrowing at the origin of the celiac axis with the appearance of median arcuate ligament syndrome.  SMA is patent.  Branch vessels of the celiac axis are grossly patent.  Atherosclerotic changes at the origin of both renal arteries is present.  Degree of narrowing is difficult to determine secondary to calcifications.  No evidence of abnormal mediastinal, hilar, or axillary adenopathy. No pericardial effusion.  No pneumothorax.  No pleural effusion.  Diffuse centrilobular emphysema is not significantly changed.  No  new mass or consolidation.  Stable parenchymal scarring at the lung apices.  Bilateral renal scarring.  Adrenal glands are within normal limits. New sub centimeter low density lesion in the right lobe of the liver has developed on image 131.  No destructive bone lesion.  T12 angioma is noted.  IMPRESSION: Aneurysmal dilatation has slightly increased.  Maximal diameter of the ascending aorta is 4.6 cm and was previously 4.2 cm.  Maximal diameter at the sinus  of Valsalva is 4.4 cm and was previously 4.3 cm.  New sub centimeter liver lesion in the right lobe has developed. It is benign appearing but because it is new, follow-up CT or MRI of the liver in 6 months is recommended to ensure stability.  It may have been obscured on prior studies secondary to phase of liver enhancement.   Original Report Authenticated By: Jolaine Click, M.D.       Recent Lab Findings: Lab Results  Component Value Date   WBC 4.0* 05/24/2011   HGB 13.8 05/24/2011   HCT 40.3 05/24/2011   PLT 193.0 05/24/2011   GLUCOSE 104* 03/06/2012   CHOL 124 03/06/2012   TRIG 56.0 03/06/2012   HDL 47.90 03/06/2012   LDLCALC 65 03/06/2012   ALT 31 03/06/2012   ALT 31 03/06/2012   AST 27 03/06/2012   AST 27 03/06/2012   NA 140 03/06/2012   K 4.2 03/06/2012   CL 104 03/06/2012   CREATININE 1.13 08/10/2012   BUN 21 08/10/2012   CO2 31 03/06/2012   TSH 3.80 11/20/2009   INR 3.1 08/01/2012   Aortic Size Index=     4.6    /Body surface area is 1.83 meters squared. =  2.51  < 2.75 cm/m2      4% risk per year 2.75 to 4.25          8% risk per year > 4.25 cm/m2    20% risk per year  cross sectional area of aorta cm2/height in meters  16.6/1.77= 9.3   Assessment / Plan:    1 status post replacement of aortic valve with 29 mm mechanical St. Jude .  Aneurysmal dilatation of the ascending thoracic aorta which measures up to 4.6cm in diameter on today's examination.  2.   sub centimeter liver lesion in the right lobe appear benign     On review of the CT  scan of the chest the ascending aorta appears basically stable still well under 5 cm in size . The  lesion in the right lobe of the liver probably benign and stable on ct . I plan to see the patient back in one year with a followup CTA of the chest to follow his dilated ascending aorta He continues to be diligent about his dental care and is aware of endocarditis precautions    Delight Ovens MD  Beeper [phone] Office [phone] 08/14/2012 1:36 PM

## 2012-08-16 ENCOUNTER — Other Ambulatory Visit: Payer: Medicare Other

## 2012-08-16 ENCOUNTER — Ambulatory Visit: Payer: Medicare Other | Admitting: Cardiothoracic Surgery

## 2012-09-12 ENCOUNTER — Ambulatory Visit (INDEPENDENT_AMBULATORY_CARE_PROVIDER_SITE_OTHER): Payer: Medicare Other | Admitting: *Deleted

## 2012-09-12 DIAGNOSIS — Z954 Presence of other heart-valve replacement: Secondary | ICD-10-CM

## 2012-09-12 DIAGNOSIS — Z7901 Long term (current) use of anticoagulants: Secondary | ICD-10-CM

## 2012-09-25 ENCOUNTER — Other Ambulatory Visit: Payer: Self-pay | Admitting: Internal Medicine

## 2012-10-20 ENCOUNTER — Other Ambulatory Visit: Payer: Self-pay | Admitting: Cardiology

## 2012-10-24 ENCOUNTER — Ambulatory Visit (INDEPENDENT_AMBULATORY_CARE_PROVIDER_SITE_OTHER): Payer: Medicare Other | Admitting: Pharmacist

## 2012-10-24 ENCOUNTER — Encounter (INDEPENDENT_AMBULATORY_CARE_PROVIDER_SITE_OTHER): Payer: Self-pay

## 2012-10-24 DIAGNOSIS — Z954 Presence of other heart-valve replacement: Secondary | ICD-10-CM

## 2012-10-24 DIAGNOSIS — Z7901 Long term (current) use of anticoagulants: Secondary | ICD-10-CM

## 2012-12-05 ENCOUNTER — Ambulatory Visit (INDEPENDENT_AMBULATORY_CARE_PROVIDER_SITE_OTHER): Payer: Medicare Other | Admitting: Pharmacist

## 2012-12-05 DIAGNOSIS — Z7901 Long term (current) use of anticoagulants: Secondary | ICD-10-CM

## 2012-12-05 DIAGNOSIS — Z954 Presence of other heart-valve replacement: Secondary | ICD-10-CM

## 2012-12-30 ENCOUNTER — Other Ambulatory Visit: Payer: Self-pay | Admitting: Cardiology

## 2013-01-02 ENCOUNTER — Ambulatory Visit: Payer: Medicare Other | Admitting: Cardiology

## 2013-01-16 ENCOUNTER — Ambulatory Visit (INDEPENDENT_AMBULATORY_CARE_PROVIDER_SITE_OTHER): Payer: Medicare Other | Admitting: Pharmacist

## 2013-01-16 DIAGNOSIS — Z7901 Long term (current) use of anticoagulants: Secondary | ICD-10-CM

## 2013-01-16 DIAGNOSIS — Z954 Presence of other heart-valve replacement: Secondary | ICD-10-CM

## 2013-01-23 ENCOUNTER — Other Ambulatory Visit: Payer: Self-pay | Admitting: Cardiology

## 2013-02-01 ENCOUNTER — Ambulatory Visit: Payer: Medicare Other | Admitting: Cardiology

## 2013-02-05 ENCOUNTER — Ambulatory Visit (INDEPENDENT_AMBULATORY_CARE_PROVIDER_SITE_OTHER): Payer: Medicare Other | Admitting: Cardiology

## 2013-02-05 ENCOUNTER — Encounter: Payer: Self-pay | Admitting: Cardiology

## 2013-02-05 ENCOUNTER — Other Ambulatory Visit: Payer: Self-pay | Admitting: Cardiology

## 2013-02-05 VITALS — BP 132/70 | HR 68 | Ht 70.0 in | Wt 151.0 lb

## 2013-02-05 DIAGNOSIS — Z954 Presence of other heart-valve replacement: Secondary | ICD-10-CM

## 2013-02-05 DIAGNOSIS — I712 Thoracic aortic aneurysm, without rupture, unspecified: Secondary | ICD-10-CM

## 2013-02-05 DIAGNOSIS — I359 Nonrheumatic aortic valve disorder, unspecified: Secondary | ICD-10-CM

## 2013-02-05 DIAGNOSIS — E785 Hyperlipidemia, unspecified: Secondary | ICD-10-CM

## 2013-02-05 MED ORDER — ATORVASTATIN CALCIUM 40 MG PO TABS: 40.0000 mg | ORAL_TABLET | Freq: Every day | ORAL | Status: DC

## 2013-02-05 NOTE — Patient Instructions (Signed)
Stop Zetia.  Increase atorvastatin to 40mg  daily.   Your physician recommends that you return for a FASTING lipid profile /liver profile in 2 months.    Your physician wants you to follow-up in: 6 months with Dr Aundra Dubin. (July 2015) You will receive a reminder letter in the mail two months in advance. If you don't receive a letter, please call our office to schedule the follow-up appointment.

## 2013-02-05 NOTE — Progress Notes (Signed)
Patient ID: David Benton, male   DOB: Aug 21, 1929, 80 y.o.   MRN: [id number] PCP: Dr. Linda Hedges  80 yo with history of St Jude mechanical aortic valve (AS, bicuspid aortic valve) and thoracic aortic aneurysm presents for cardiology followup.  Echo in 5/14 showed normal EF and stable mechanical aortic valve.  He has been doing well recently.  No exertional dyspnea or chest pain.  He tries to walk a 10K once a week.  He golfs occasionally.  He has had no bleeding problems on coumadin. He has been taking brand-name coumadin and it is getting expensive.  Weight is stable.   ECG: NSR, 1st degree AV block, iRBBB, LAFB, septal Qs  Labs (2/14): LDL 65, HDL 48, K 4.2, creatinine 1.1 Labs (7/14): creatinine 1.13  PMH: 1. Prostate cancer s/p prostectomy in 1995 2. Meniere's disease 3. Shingles 4. St Jude mechanical aortic valve #29 placed in 1991.  Patient had AS with bicuspid aortic valve. Echo (9/08) with EF 60%, normal mechanical aortic valve.  Echo (5/14) with EF 60%, mechanical aortic valve with normal function.  5. H/o CCY 6. Hyperlipidemia 7. Thoracic aortic aneurysm: Ascending aorta 4.5 cm by CTA in 1/14.  Ascending aorta 4.6 cm by CTA in 7/14. Followed by [name].   FH:  Father with MI  SH: Married, prior smoker.  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  Lives in [place].  ROS: All systems reviewed and negative except as per HPI.   Current Outpatient Prescriptions  Medication Sig Dispense Refill  . acetaminophen (TYLENOL) 325 MG tablet Take 650 mg by mouth as needed.        Marland Kitchen amoxicillin (AMOXIL) 500 MG capsule Take 1 capsule (500 mg total) by mouth as needed. As directed per dental work as needed  12 capsule  1  . aspirin EC 81 MG tablet Take 1 tablet (81 mg total) by mouth daily.      Marland Kitchen COUMADIN 5 MG tablet TAKE AS DIRECTED BY COUMADIN CLINIC.  45 tablet  3  . magnesium hydroxide (MILK OF MAGNESIA) 400 MG/5ML suspension Take by mouth daily as needed.         . TRIAMTERENE-HCTZ PO Take by mouth. 37.5-25 mg take 1 daily.       Marland Kitchen atorvastatin (LIPITOR) 40 MG tablet Take 1 tablet (40 mg total) by mouth daily.  30 tablet  3   No current facility-administered medications for this visit.    BP 132/70  Pulse 68  Ht 5\' 10"  (1.778 m)  Wt 68.493 kg (151 lb)  BMI 21.67 kg/m2 General: NAD Neck: No JVD, no thyromegaly or thyroid nodule.  Lungs: Clear to auscultation bilaterally with normal respiratory effort. CV: Nondisplaced PMI.  Heart regular S1/S2, no S3/S4, mechanical S2, 1/6 SEM RUSB.  No peripheral edema.  No carotid bruit.  Normal pedal pulses.  Abdomen: Soft, nontender, no hepatosplenomegaly, no distention.   Neurologic: Alert and oriented x 3.  Psych: Normal affect. Extremities: No clubbing or cyanosis.   Assessment/Plan: 1. Mechanical aortic valve: No particular complaints.  Crisp valve sound.  He is on coumadin and ASA 81.  Valve looked ok by echo in 5/14.  He wants to switch to generic warfarin to save money.  I think that would be fine.  2. Thoracic aortic aneurysm: Bicuspid aortic valve disorder with AS (now corrected) and thoracic aortic aneurysm.  4.6 cm ascending aortic aneurysm in 7/14.  Follows regularly with [name].  BP is  controlled.   3. Hyperlipidemia: He wants to stop Zetia to save money.  He can stop Zetia and increase atorvastatin to 40 mg daily.  Lipids/LFTs in 3 months.   Loralie Champagne 02/05/2013

## 2013-02-27 ENCOUNTER — Ambulatory Visit (INDEPENDENT_AMBULATORY_CARE_PROVIDER_SITE_OTHER): Payer: Medicare Other | Admitting: *Deleted

## 2013-02-27 DIAGNOSIS — Z5181 Encounter for therapeutic drug level monitoring: Secondary | ICD-10-CM | POA: Insufficient documentation

## 2013-02-27 DIAGNOSIS — Z954 Presence of other heart-valve replacement: Secondary | ICD-10-CM

## 2013-02-27 DIAGNOSIS — Z7901 Long term (current) use of anticoagulants: Secondary | ICD-10-CM

## 2013-02-27 LAB — POCT INR: INR: 2.3

## 2013-03-07 ENCOUNTER — Encounter: Payer: Self-pay | Admitting: Internal Medicine

## 2013-03-07 ENCOUNTER — Ambulatory Visit (INDEPENDENT_AMBULATORY_CARE_PROVIDER_SITE_OTHER): Payer: Medicare Other | Admitting: Internal Medicine

## 2013-03-07 ENCOUNTER — Other Ambulatory Visit: Payer: Medicare Other

## 2013-03-07 VITALS — BP 140/70 | HR 69 | Temp 97.1°F | Ht 70.0 in | Wt 151.0 lb

## 2013-03-07 DIAGNOSIS — Z0000 Encounter for general adult medical examination without abnormal findings: Secondary | ICD-10-CM

## 2013-03-07 DIAGNOSIS — Z954 Presence of other heart-valve replacement: Secondary | ICD-10-CM

## 2013-03-07 DIAGNOSIS — I712 Thoracic aortic aneurysm, without rupture, unspecified: Secondary | ICD-10-CM

## 2013-03-07 DIAGNOSIS — I10 Essential (primary) hypertension: Secondary | ICD-10-CM

## 2013-03-07 DIAGNOSIS — K5909 Other constipation: Secondary | ICD-10-CM

## 2013-03-07 DIAGNOSIS — Z8546 Personal history of malignant neoplasm of prostate: Secondary | ICD-10-CM

## 2013-03-07 DIAGNOSIS — E785 Hyperlipidemia, unspecified: Secondary | ICD-10-CM

## 2013-03-07 DIAGNOSIS — Z23 Encounter for immunization: Secondary | ICD-10-CM

## 2013-03-07 DIAGNOSIS — Z7901 Long term (current) use of anticoagulants: Secondary | ICD-10-CM

## 2013-03-07 LAB — BASIC METABOLIC PANEL
BUN: 27 mg/dL — AB (ref 6–23)
CALCIUM: 10.1 mg/dL (ref 8.4–10.5)
CO2: 30 mEq/L (ref 19–32)
CREATININE: 1.2 mg/dL (ref 0.4–1.5)
Chloride: 103 mEq/L (ref 96–112)
GFR: 62.88 mL/min (ref >60.00)
GLUCOSE: 92 mg/dL (ref 70–99)
POTASSIUM: 4.4 meq/L (ref 3.5–5.1)
Sodium: 139 mEq/L (ref 135–145)

## 2013-03-07 LAB — HEMATOCRIT: HCT: 43.8 % (ref 39.0–52.0)

## 2013-03-07 LAB — LIPID PANEL
Cholesterol: 166 mg/dL (ref 0–200)
HDL: 51.4 mg/dL (ref >39.00)
LDL Cholesterol: 87 mg/dL (ref 0–99)
Total CHOL/HDL Ratio: 3
Triglycerides: 139 mg/dL (ref 0.0–149.0)
VLDL: 27.8 mg/dL (ref 0.0–40.0)

## 2013-03-07 LAB — HEMOGLOBIN: HEMOGLOBIN: 14.6 g/dL (ref 13.0–17.0)

## 2013-03-07 NOTE — Assessment & Plan Note (Signed)
Patient reports bowel movements regularly with the assistance of Milk of Magnesia.  Plan Continue milk of magnesia as needed.

## 2013-03-07 NOTE — Assessment & Plan Note (Addendum)
Patient was recently increased to lipitor 40mg  for better control.  Plan Continue current medication  Lipid panel today  Addendum: LDL today - 87

## 2013-03-07 NOTE — Progress Notes (Signed)
Pre visit review using our clinic review tool, if applicable. No additional management support is needed unless otherwise documented below in the visit note. 

## 2013-03-07 NOTE — Assessment & Plan Note (Addendum)
Patient presents for health maintenance. He is in good shape, reporting some mild hearing loss but no other physical complaints. Normal exam. Prevnar vaccine given today. Given a prescription for zostavax today. He   In summary this is a spry 80 year old man in no acute distress who presents for an annual physical and health maintenance. His immunizations are up to date. Follow up in one year or as necessary.

## 2013-03-07 NOTE — Patient Instructions (Addendum)
It has been a pleasure providing care for you today.  1) Health Maintenance - You are in great shape! Keep it up.  - We gave you the Prevnar pneumonia vaccine today - We checked your Lipids, metabolic profile, hemoglobin and hematocrit today  2) Shingles - I gave you a handwritten prescription for the shingles vaccine. - You cannot have this shot for four weeks because we gave you the Prevnar today. - Shop around to see which drug store (or our office) gives you the best price for this shot.  3) Retirement - I am retiring at the end of March. - At that time, we will transfer your care to Dr. Sarajane Jews at [place]. - We will schedule a follow up appointment with him in three months.

## 2013-03-07 NOTE — Assessment & Plan Note (Addendum)
Patient currently endorses no urinary urgency, frequency, dysuria, or any other urinary problems.  Plan Return to office if problems present.

## 2013-03-07 NOTE — Progress Notes (Signed)
Subjective:     Patient ID: David Benton, male   DOB: November 05, 1929, 80 y.o.   MRN: [id number]  HPI  The patient is here for annual Medicare wellness examination and management of other chronic and acute problems. Interval history is unremarkable for major illness, hospitalization, or surgery.   The risk factors are reflected in the social history.  The roster of all physicians providing medical care to patient - is listed in the Snapshot section of the chart.  Activities of daily living:  The patient is 100% independent in all ADLs: dressing, toileting, feeding as well as independent mobility  Home safety : The patient has smoke detectors in the home. They wear seatbelts. No firearms at home. There is no violence in the home. No falls.   There is no risks for hepatitis, STDs or HIV. There is no   history of blood transfusion. They have no travel history to infectious disease endemic areas of the world.  The patient has seen their dentist in the last six month. They have seen their eye doctor in the last year. They admit to hearing difficulty in Left ear and have not had audiologic testing in the last year.  They do not  have excessive sun exposure. Discussed the need for sun protection: hats, long sleeves and use of sunscreen if there is significant sun exposure.   Diet: the importance of a healthy diet is discussed. They do have a healthy  Diet  Wife prepares most of the meals.   The patient has a regular exercise program:golf, walking, 1 hour duration, 7 times per week.  The benefits of regular aerobic exercise were discussed.  Depression screen: there are no signs or vegative symptoms of depression- irritability, change in appetite, anhedonia, sadness/tearfullness.  Cognitive assessment: the patient manages all their financial and personal affairs and is actively engaged. They could relate day,date,year and events; recalled 3/3 objects at 3 minutes;  The following portions of the  patient's history were reviewed and updated as appropriate: allergies, current medications, past family history, past medical history,  past surgical history, past social history  and problem list.  Vision, hearing, body mass index were assessed and reviewed.   During the course of the visit the patient was educated and counseled about appropriate screening and preventive services including : fall prevention , diabetes screening, nutrition counseling, colorectal cancer screening, and recommended immunizations.  Past Medical History  Diagnosis Date  . Shingles   . Thoracic aneurysm without mention of rupture   . Heart valve replaced by other means   . Aortic valve disorders   . Unspecified essential hypertension   . Other and unspecified hyperlipidemia   . Personal history of colonic polyps   . External hemorrhoids without mention of complication   . Other constipation   . Personal history of malignant neoplasm of prostate   . Personal history of other disorders of nervous system and sense organs   . Meniere's disease   . AVD (aortic valve disease) 02/25/2010  . Long term (current) use of anticoagulants 09/15/2010  . SHINGLES 11/26/2009   Past Surgical History  Procedure Laterality Date  . Aortic valve replacement      [address]  . Hemorrhoid surgery    . Prostatectomy  1995  . Tonsillectomy     Family History  Problem Relation Age of Onset  . Heart attack Father     mother  . Colon cancer Neg Hx   . Prostate cancer  Brother   . Diabetes Brother   . Stroke Brother   . Heart disease Brother     carotid artery disease  . Hypertension Brother   . Cancer Sister     breast cancer; mastectomy at 50   History   Social History  . Marital Status: Married    Spouse Name: N/A    Number of Children: 0  . Years of Education: 13   Occupational History  . retired   .     Social History Main Topics  . Smoking status: Former Smoker    Types: Cigarettes    Quit date:  02/06/1972  . Smokeless tobacco: Never Used  . Alcohol Use: 3.5 oz/week    7 drink(s) per week     Comment: glass of wine daily  . Drug Use: No  . Sexual Activity: No   Other Topics Concern  . Not on file   Social History Narrative   HSG, a couple of years of college. Married '78 - . No children. Work - [employer] 24 years - retired.   Lives alone with wife and two dogs. ACP - Yes CPR, Yes - short-term mechanical ventilation. Does not want prolonged heroic measures in the face of irreversible disease. HCPOA - wife. Alternative is his brother: David Benton [facility].      Review of Systems Constitutional:  Negative for fever, chills, activity change and unexpected weight change.  HEENT: Positive for hearing loss, negative for ear pain, congestion, neck stiffness and postnasal drip. Negative for sore throat or swallowing problems. Negative for dental complaints.   Eyes: Negative for vision loss or change in visual acuity.  Respiratory: Negative for chest tightness and wheezing. Negative for DOE.   Cardiovascular: Negative for chest pain or palpitations. No decreased exercise tolerance Gastrointestinal: No change in bowel habit. Constipation treated with MoM. No bloating or gas. No reflux or indigestion Genitourinary: Negative for urgency, frequency, flank pain and difficulty urinating.  Musculoskeletal: Negative for myalgias, back pain, arthralgias and gait problem.  Neurological: Negative for dizziness, tremors, weakness and headaches.  Hematological: Negative for adenopathy.  Psychiatric/Behavioral: Negative for behavioral problems and dysphoric mood.       Objective:   Physical Exam Filed Vitals:   03/07/13 0939  BP: 140/70  Pulse: 69  Temp: 97.1 F (36.2 C)   Wt Readings from Last 3 Encounters:  03/07/13 151 lb (68.493 kg)  02/05/13 151 lb (68.493 kg)  08/14/12 150 lb (68.04 kg)   Gen'l: Well nourished well developed man looking younger than stated age in no acute  distress  HEENT: Head: Normocephalic and atraumatic. Right Ear: External ear normal. EAC/TM nl. Left Ear: External ear normal.  EAC/TM nl. Nose: Nose normal. Mouth/Throat: Oropharynx is clear and moist. Dentition - native, in good repair. No buccal or palatal lesions. Posterior pharynx clear. Eyes: Conjunctivae and sclera clear. EOM intact. Pupils are equal, round, and reactive to light. Right eye exhibits no discharge. Left eye exhibits no discharge. Neck: Normal range of motion. Neck supple. No JVD present. No tracheal deviation present. No thyromegaly present.  Cardiovascular: Normal rate, regular rhythm, no gallop, no friction rub, no murmurs, S2 click c/w mechanical AoV. Quiet precordium. 2+ radial, posterior tibial, and DP pulses . No carotid bruits Pulmonary/Chest: Effort normal. No respiratory distress or increased WOB, no wheezes, no rales. No chest wall deformity or CVAT. Abdomen: Soft. Bowel sounds are normal in all quadrants. He exhibits no distension, no tenderness, no rebound or  guarding, No heptosplenomegaly  Genitourinary:  deferred. Musculoskeletal: Normal range of motion. He exhibits no edema and no tenderness.       Small and large joints without redness, synovial thickening or deformity. Full range of motion preserved about all small, median and large joints.  Lymphadenopathy:  He has no posterior auricular, anterior cervical or supraclavicular adenopathy.  Neurological: He is alert and oriented to person, place, and time. CN II-XII intact. DTRs 2+ and symmetrical biceps, radial and patellar tendons. Cerebellar function normal with no tremor, rigidity, normal gait and station.  Skin: Skin is warm and dry. No rash noted. No erythema.  Psychiatric: He has a normal mood and affect. His behavior is normal. Thought content normal.   Current Outpatient Prescriptions on File Prior to Visit  Medication Sig Dispense Refill  . acetaminophen (TYLENOL) 325 MG tablet Take 650 mg by mouth as  needed.        Marland Kitchen amoxicillin (AMOXIL) 500 MG capsule Take 1 capsule (500 mg total) by mouth as needed. As directed per dental work as needed  12 capsule  1  . aspirin EC 81 MG tablet Take 1 tablet (81 mg total) by mouth daily.      Marland Kitchen atorvastatin (LIPITOR) 40 MG tablet Take 1 tablet (40 mg total) by mouth daily.  30 tablet  3  . COUMADIN 5 MG tablet TAKE AS DIRECTED BY COUMADIN CLINIC.  45 tablet  3  . magnesium hydroxide (MILK OF MAGNESIA) 400 MG/5ML suspension Take by mouth daily as needed.        . TRIAMTERENE-HCTZ PO Take by mouth. 37.5-25 mg take 1 daily.        No current facility-administered medications on file prior to visit.       Assessment and Plan:

## 2013-03-07 NOTE — Assessment & Plan Note (Addendum)
Patient followed by Cardiology for anticoagulation and dyslipidemia.  Medications:  coumadin per protocol.

## 2013-03-07 NOTE — Assessment & Plan Note (Addendum)
[name] recently took him off of zetia, started coumadin. Followed closely by cardiology.  Plan Continue current medication and coagulation checks per cardiology.  Hb/Hct Today  Addendum -  Lab Results  Component Value Date   HGB 14.6 03/07/2013

## 2013-03-07 NOTE — Assessment & Plan Note (Addendum)
BP Readings from Last 3 Encounters:  03/07/13 140/70  02/05/13 132/70  08/14/12 109/66   Well controlled blood pressure per JNC8 guidelines - more permissive for the older patient  Plan Continue current medications  BMET for routine follow up

## 2013-03-07 NOTE — Assessment & Plan Note (Signed)
Followed by [name]. CTA from 07/2012 showed no change in aneurysm size compared to previous study.   Plan Follow up with [name] for serial CTA per protocol.

## 2013-03-10 ENCOUNTER — Encounter: Payer: Self-pay | Admitting: Internal Medicine

## 2013-03-13 ENCOUNTER — Encounter (HOSPITAL_COMMUNITY): Payer: Self-pay | Admitting: Emergency Medicine

## 2013-03-13 ENCOUNTER — Emergency Department (HOSPITAL_COMMUNITY)
Admission: EM | Admit: 2013-03-13 | Discharge: 2013-03-13 | Disposition: A | Discharge status: Home / Self Care | Payer: Medicare Other | Attending: Emergency Medicine | Admitting: Emergency Medicine

## 2013-03-13 DIAGNOSIS — Z7901 Long term (current) use of anticoagulants: Secondary | ICD-10-CM | POA: Insufficient documentation

## 2013-03-13 DIAGNOSIS — Z860100 Personal history of colon polyps, unspecified: Secondary | ICD-10-CM | POA: Insufficient documentation

## 2013-03-13 DIAGNOSIS — Z8669 Personal history of other diseases of the nervous system and sense organs: Secondary | ICD-10-CM | POA: Insufficient documentation

## 2013-03-13 DIAGNOSIS — E785 Hyperlipidemia, unspecified: Secondary | ICD-10-CM | POA: Insufficient documentation

## 2013-03-13 DIAGNOSIS — Z8719 Personal history of other diseases of the digestive system: Secondary | ICD-10-CM | POA: Insufficient documentation

## 2013-03-13 DIAGNOSIS — Z8774 Personal history of (corrected) congenital malformations of heart and circulatory system: Secondary | ICD-10-CM | POA: Insufficient documentation

## 2013-03-13 DIAGNOSIS — R040 Epistaxis: Secondary | ICD-10-CM | POA: Insufficient documentation

## 2013-03-13 DIAGNOSIS — Z954 Presence of other heart-valve replacement: Secondary | ICD-10-CM | POA: Insufficient documentation

## 2013-03-13 DIAGNOSIS — I10 Essential (primary) hypertension: Secondary | ICD-10-CM | POA: Insufficient documentation

## 2013-03-13 DIAGNOSIS — Z7982 Long term (current) use of aspirin: Secondary | ICD-10-CM | POA: Insufficient documentation

## 2013-03-13 DIAGNOSIS — Z8619 Personal history of other infectious and parasitic diseases: Secondary | ICD-10-CM | POA: Insufficient documentation

## 2013-03-13 DIAGNOSIS — Z8546 Personal history of malignant neoplasm of prostate: Secondary | ICD-10-CM | POA: Insufficient documentation

## 2013-03-13 DIAGNOSIS — D689 Coagulation defect, unspecified: Secondary | ICD-10-CM | POA: Insufficient documentation

## 2013-03-13 DIAGNOSIS — Z79899 Other long term (current) drug therapy: Secondary | ICD-10-CM | POA: Insufficient documentation

## 2013-03-13 DIAGNOSIS — Z87891 Personal history of nicotine dependence: Secondary | ICD-10-CM | POA: Insufficient documentation

## 2013-03-13 LAB — CBC WITH DIFFERENTIAL/PLATELET
Basophils Absolute: 0 10*3/uL (ref 0.0–0.1)
Basophils Relative: 1 % (ref 0–1)
EOS PCT: 2 % (ref 0–5)
Eosinophils Absolute: 0.1 10*3/uL (ref 0.0–0.7)
HCT: 40.3 % (ref 39.0–52.0)
HEMOGLOBIN: 13.4 g/dL (ref 13.0–17.0)
LYMPHS ABS: 0.7 10*3/uL (ref 0.7–4.0)
LYMPHS PCT: 18 % (ref 12–46)
MCH: 29.5 pg (ref 26.0–34.0)
MCHC: 33.3 g/dL (ref 30.0–36.0)
MCV: 88.6 fL (ref 78.0–100.0)
MONOS PCT: 13 % — AB (ref 3–12)
Monocytes Absolute: 0.5 10*3/uL (ref 0.1–1.0)
NEUTROS PCT: 67 % (ref 43–77)
Neutro Abs: 2.8 10*3/uL (ref 1.7–7.7)
PLATELETS: 205 10*3/uL (ref 150–400)
RBC: 4.55 MIL/uL (ref 4.22–5.81)
RDW: 13 % (ref 11.5–15.5)
WBC: 4.2 10*3/uL (ref 4.0–10.5)

## 2013-03-13 LAB — PROTIME-INR
INR: 1.96 — AB (ref 0.00–1.49)
PROTHROMBIN TIME: 21.7 s — AB (ref 11.6–15.2)

## 2013-03-13 LAB — APTT: aPTT: 33 seconds (ref 24–37)

## 2013-03-13 MED ORDER — OXYMETAZOLINE HCL 0.05 % NA SOLN: NASAL | Status: DC

## 2013-03-13 NOTE — ED Notes (Signed)
Pt states 3-4 hours of nose bleed.  Pt is on coumadin.  Pt went to urgent care and was told to come here.  Pt states he has not been blowing nose or did not have trauma.  Pt used ice and pinched nose at home in order to assist controlling bleed.  Pt bleeding very minimal at this time.

## 2013-03-13 NOTE — ED Provider Notes (Signed)
CSN: [id number]     Arrival date & time 03/13/13  1102 History   First MD Initiated Contact with Patient 03/13/13 1314     Chief Complaint  Patient presents with  . Epistaxis     (Consider location/radiation/quality/duration/timing/severity/associated sxs/prior Treatment) HPI This is an 80 year old male with a past medical history of aortic valve replacement secondary to congenital heart defect who presents the emergency department with chief complaint of epistaxis.  The patient is anticoagulated on Coumadin.  Patient has been on long-term Coumadin use since 1991.  Patient states that he does have occasional nosebleeds however the one he had today was his first.  Patient states that he had a possibly 4 hours of nosebleed without resolution.  He tried direct pressure and ice.  The patient states that he became worried that he might be over anticoagulated and came to the emergency department for evaluation.  He does state that the house has been very dry.  He denies any recent URIs.  The patient had a long wait in the waiting room of the ED at which time his epistaxis resolved.  The patient does appear to be hypertensive today to  Past Medical History  Diagnosis Date  . Shingles   . Thoracic aneurysm without mention of rupture   . Heart valve replaced by other means   . Aortic valve disorders   . Unspecified essential hypertension   . Other and unspecified hyperlipidemia   . Personal history of colonic polyps   . External hemorrhoids without mention of complication   . Other constipation   . Personal history of malignant neoplasm of prostate   . Personal history of other disorders of nervous system and sense organs   . Meniere's disease   . AVD (aortic valve disease) 02/25/2010  . Long term (current) use of anticoagulants 09/15/2010  . SHINGLES 11/26/2009   Past Surgical History  Procedure Laterality Date  . Aortic valve replacement      [address] valve  . Hemorrhoid surgery    .  Prostatectomy  1995  . Tonsillectomy     Family History  Problem Relation Age of Onset  . Heart attack Father     mother  . Colon cancer Neg Hx   . Prostate cancer Brother   . Diabetes Brother   . Stroke Brother   . Heart disease Brother     carotid artery disease  . Hypertension Brother   . Cancer Sister     breast cancer; mastectomy at 57   History  Substance Use Topics  . Smoking status: Former Smoker    Types: Cigarettes    Quit date: 02/06/1972  . Smokeless tobacco: Never Used  . Alcohol Use: 3.5 oz/week    7 drink(s) per week     Comment: glass of wine daily    Review of Systems  Constitutional: Negative for fever and chills.  HENT: Positive for nosebleeds. Negative for dental problem, facial swelling, rhinorrhea, sinus pressure, sneezing, trouble swallowing and voice change.   Respiratory: Negative for cough and shortness of breath.   Cardiovascular: Negative for chest pain and palpitations.  Gastrointestinal: Negative for vomiting, abdominal pain, diarrhea and constipation.  Genitourinary: Negative for dysuria, urgency and frequency.  Musculoskeletal: Negative for arthralgias and myalgias.  Skin: Negative for rash.  Neurological: Negative for headaches.  Psychiatric/Behavioral: Positive for behavioral problems.      Allergies  Codeine  Home Medications   Current Outpatient Rx  Name  Route  Sig  Dispense  Refill  . acetaminophen (TYLENOL) 325 MG tablet   Oral   Take 650 mg by mouth as needed.           Marland Kitchen aspirin EC 81 MG tablet   Oral   Take 1 tablet (81 mg total) by mouth daily.         Marland Kitchen atorvastatin (LIPITOR) 40 MG tablet   Oral   Take 1 tablet (40 mg total) by mouth daily.   30 tablet   3   . magnesium hydroxide (MILK OF MAGNESIA) 400 MG/5ML suspension   Oral   Take 30 mLs by mouth at bedtime as needed for indigestion.          . TRIAMTERENE-HCTZ PO   Oral   Take by mouth. 37.5-25 mg take 1 daily.          Marland Kitchen warfarin  (COUMADIN) 5 MG tablet   Oral   Take by mouth. Take 1 & 1/2 tablets (7.5mg ) by mouth on Mon, Tues, Thurs, Fri, Sun and take 1 tab (5mg ) on Wed and Sat.         Marland Kitchen oxymetazoline (AFRIN NASAL SPRAY) 0.05 % nasal spray      Place 1 spray into both nostrils 2 (two) times daily for the next 2-3 days.   30 mL   0    BP 173/88  Pulse 81  Temp(Src) 98.1 F (36.7 C) (Oral)  Resp 16  SpO2 99% Physical Exam  Nursing note and vitals reviewed. Constitutional: He appears well-developed and well-nourished. No distress.  HENT:  Head: Normocephalic and atraumatic.  Nose: Epistaxis is observed.    Mouth/Throat: Oropharynx is clear and moist.  Eyes: Conjunctivae are normal. No scleral icterus.  Neck: Normal range of motion. Neck supple.  Cardiovascular: Normal rate, regular rhythm and normal heart sounds.   Pulmonary/Chest: Effort normal and breath sounds normal. No respiratory distress.  Abdominal: Soft. There is no tenderness.  Musculoskeletal: He exhibits no edema.  Neurological: He is alert.  Skin: Skin is warm and dry. He is not diaphoretic.  Psychiatric: His behavior is normal.    ED Course  Procedures (including critical care time) Labs Review Labs Reviewed  CBC WITH DIFFERENTIAL - Abnormal; Notable for the following:    Monocytes Relative 13 (*)    All other components within normal limits  PROTIME-INR - Abnormal; Notable for the following:    Prothrombin Time 21.7 (*)    INR 1.96 (*)    All other components within normal limits  APTT   Imaging Review No results found.  EKG Interpretation   None       MDM   Final diagnoses:  Epistaxis  Anticoagulated on Coumadin    Patient is subtherapeutic on his INR 1.96.  I've advised him to follow up with his prescribing physician.  Patient will be discharged with 2-3 days of twice a day Afrin.  Patient given handout on nasal hygiene and he is to followup with ear nose and throat index week.    Margarita Mail,  PA-C 03/13/13 1353

## 2013-03-13 NOTE — Discharge Instructions (Signed)
Nosebleed Nosebleeds can be caused by many conditions including trauma, infections, polyps, foreign bodies, dry mucous membranes or climate, medications and air conditioning. Most nosebleeds occur in the front of the nose. It is because of this location that most nosebleeds can be controlled by pinching the nostrils gently and continuously. Do this for at least 10 to 20 minutes. The reason for this long continuous pressure is that you must hold it long enough for the blood to clot. If during that 10 to 20 minute time period, pressure is released, the process may have to be started again. The nosebleed may stop by itself, quit with pressure, need concentrated heating (cautery) or stop with pressure from packing. HOME CARE INSTRUCTIONS   If your nose was packed, try to maintain the pack inside until your caregiver removes it. If a gauze pack was used and it starts to fall out, gently replace or cut the end off. Do not cut if a balloon catheter was used to pack the nose. Otherwise, do not remove unless instructed.  Avoid blowing your nose for 12 hours after treatment. This could dislodge the pack or clot and start bleeding again.  If the bleeding starts again, sit up and bending forward, gently pinch the front half of your nose continuously for 20 minutes.  If bleeding was caused by dry mucous membranes, cover the inside of your nose every morning with a petroleum or antibiotic ointment. Use your little fingertip as an applicator. Do this as needed during dry weather. This will keep the mucous membranes moist and allow them to heal.  Maintain humidity in your home by using less air conditioning or using a humidifier.  Do not use aspirin or medications which make bleeding more likely. Your caregiver can give you recommendations on this.  Resume normal activities as able but try to avoid straining, lifting or bending at the waist for several days.  If the nosebleeds become recurrent and the cause is  unknown, your caregiver may suggest laboratory tests. SEEK IMMEDIATE MEDICAL CARE IF:   Bleeding recurs and cannot be controlled.  There is unusual bleeding from or bruising on other parts of the body.  You have a fever.  Nosebleeds continue.  There is any worsening of the condition which originally brought you in.  You become lightheaded, feel faint, become sweaty or vomit blood. MAKE SURE YOU:   Understand these instructions.  Will watch your condition.  Will get help right away if you are not doing well or get worse. Document Released: 10/13/2004 Document Revised: 03/28/2011 Document Reviewed: 12/05/2008 [facility] Patient Information 2014 [place], [place].  Nasal Hygiene The nose has many positive effects on the air you breathe in that you may not be aware of. - Temperature regulation - Filtration and removal of particulate matter - Humidification - Defense against infections There are several things you can do to help keep your nose healthy. Foremost is nasal hygiene. This will help with your nose's natural function and keep it moist and healthy. Techniques to accomplish this are outlined below. These will help with nasal dryness, nasal crusting, and nose bleeds. They also assist with clearing thick mucus that cause you to blow your nose frequently and may be associated with thick postnasal drip. 1. Use nasal saline daily. You can buy small bottles of this over-the-counter at the drug store or grocery store. Some brand names are: [place], [place] [employer] [place] [employer], [place]. Apply 2-3 sprays each nostril several times a day. If your nose feels dry or have  had recent nasal surgery, try to use it every couple of hours. There is no medicine in it so it can be used as often as you like. Do NOT use the sprays containing decongestants. The appropriate way to apply nasal sprays: Place the nozzle just inside your nostril and point it towards the corner of your eye. Often, it is helpful to use  the right-hand to spray into the left nasal cavity and use the left-hand to spray into the right side.  2. Use nasal saline irrigations and flushing. Several prepackaged, sanitary preparations are available over-the-counter: NeilMed Sinus Rinse, SinuCleanse, Simply Saline, [place]. Use this 1-2 times a day. We can also provide you with a recipe to use at home. Just ask [place]. To prevent reintroducing bacteria back into your nose, please keep your irrigation equipment clean and dry between uses. Throw away and replace reusable irrigation equipment every 3 weeks. A word of preference. The Neti Pot can be useful for moisturization. However, we may recommend you use a technique that will allow for flushing of your nose.

## 2013-03-13 NOTE — ED Provider Notes (Signed)
Medical screening examination/treatment/procedure(s) were performed by non-physician practitioner and as supervising physician I was immediately available for consultation/collaboration.  EKG Interpretation   None        Jasper Riling. Alvino Chapel, MD 03/13/13 [id number]

## 2013-03-18 ENCOUNTER — Telehealth: Payer: Self-pay | Admitting: Internal Medicine

## 2013-03-18 NOTE — Telephone Encounter (Signed)
Recd records from [facility] and Throat., Forwarding 1of 3 to Dr.Norins, Legrand Como

## 2013-03-20 ENCOUNTER — Ambulatory Visit (INDEPENDENT_AMBULATORY_CARE_PROVIDER_SITE_OTHER): Payer: Medicare Other | Admitting: *Deleted

## 2013-03-20 DIAGNOSIS — Z954 Presence of other heart-valve replacement: Secondary | ICD-10-CM

## 2013-03-20 DIAGNOSIS — Z5181 Encounter for therapeutic drug level monitoring: Secondary | ICD-10-CM

## 2013-03-20 DIAGNOSIS — Z7901 Long term (current) use of anticoagulants: Secondary | ICD-10-CM

## 2013-03-20 LAB — POCT INR: INR: 1.9

## 2013-04-03 ENCOUNTER — Ambulatory Visit (INDEPENDENT_AMBULATORY_CARE_PROVIDER_SITE_OTHER): Payer: Medicare Other | Admitting: *Deleted

## 2013-04-03 DIAGNOSIS — Z5181 Encounter for therapeutic drug level monitoring: Secondary | ICD-10-CM

## 2013-04-03 DIAGNOSIS — Z7901 Long term (current) use of anticoagulants: Secondary | ICD-10-CM

## 2013-04-03 DIAGNOSIS — Z954 Presence of other heart-valve replacement: Secondary | ICD-10-CM

## 2013-04-03 LAB — POCT INR: INR: 2.4

## 2013-04-08 ENCOUNTER — Other Ambulatory Visit (INDEPENDENT_AMBULATORY_CARE_PROVIDER_SITE_OTHER): Payer: Medicare Other

## 2013-04-08 DIAGNOSIS — E785 Hyperlipidemia, unspecified: Secondary | ICD-10-CM

## 2013-04-08 DIAGNOSIS — I10 Essential (primary) hypertension: Secondary | ICD-10-CM

## 2013-04-08 DIAGNOSIS — I359 Nonrheumatic aortic valve disorder, unspecified: Secondary | ICD-10-CM

## 2013-04-08 LAB — HEPATIC FUNCTION PANEL
ALT: 21 U/L (ref 0–53)
AST: 19 U/L (ref 0–37)
Albumin: 4.1 g/dL (ref 3.5–5.2)
Alkaline Phosphatase: 85 U/L (ref 39–117)
BILIRUBIN DIRECT: 0.2 mg/dL (ref 0.0–0.3)
BILIRUBIN TOTAL: 1.9 mg/dL — AB (ref 0.3–1.2)
Total Protein: 6.8 g/dL (ref 6.0–8.3)

## 2013-04-08 LAB — LIPID PANEL
CHOLESTEROL: 148 mg/dL (ref 0–200)
HDL: 42.3 mg/dL (ref >39.00)
LDL Cholesterol: 90 mg/dL (ref 0–99)
Total CHOL/HDL Ratio: 3
Triglycerides: 79 mg/dL (ref 0.0–149.0)
VLDL: 15.8 mg/dL (ref 0.0–40.0)

## 2013-04-17 ENCOUNTER — Ambulatory Visit (INDEPENDENT_AMBULATORY_CARE_PROVIDER_SITE_OTHER): Payer: Medicare Other | Admitting: *Deleted

## 2013-04-17 DIAGNOSIS — Z7901 Long term (current) use of anticoagulants: Secondary | ICD-10-CM

## 2013-04-17 DIAGNOSIS — Z954 Presence of other heart-valve replacement: Secondary | ICD-10-CM

## 2013-04-17 DIAGNOSIS — Z5181 Encounter for therapeutic drug level monitoring: Secondary | ICD-10-CM

## 2013-04-17 LAB — POCT INR: INR: 2.7

## 2013-04-25 ENCOUNTER — Ambulatory Visit: Payer: Medicare Other

## 2013-04-25 ENCOUNTER — Ambulatory Visit (INDEPENDENT_AMBULATORY_CARE_PROVIDER_SITE_OTHER): Payer: Medicare Other | Admitting: Family Medicine

## 2013-04-25 VITALS — BP 126/70 | HR 70 | Temp 98.2°F | Resp 16 | Ht 70.0 in | Wt 151.6 lb

## 2013-04-25 DIAGNOSIS — M25522 Pain in left elbow: Secondary | ICD-10-CM

## 2013-04-25 DIAGNOSIS — M25429 Effusion, unspecified elbow: Secondary | ICD-10-CM

## 2013-04-25 DIAGNOSIS — M25519 Pain in unspecified shoulder: Secondary | ICD-10-CM

## 2013-04-25 NOTE — Progress Notes (Addendum)
This chart was scribed for Robyn Haber, MD by Vernell Barrier, Medical Scribe. This patient's care was started at 5:09 PM.   Patient ID: David Benton MRN: [id number], DOB: 25-May-1929, 80 y.o. Date of Encounter: 04/25/2013, 5:08 PM  Primary Physician: [name], MD  Chief Complaint: pain & swelling in left arm   HPI: 80 y.o. year old male with history below presents with a gradually worsening painful, swollen, discolored left arm. Says he is able to straighten the left arm but with some difficulty and "tightness." Denies any known trauma related to current pain. Pt states he was playing golf 3 days ago and noticed pain but did think anything of it. 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   Past Medical History  Diagnosis Date   Shingles    Thoracic aneurysm without mention of rupture    Heart valve replaced by other means    Aortic valve disorders    Unspecified essential hypertension    Other and unspecified hyperlipidemia    Personal history of colonic polyps    External hemorrhoids without mention of complication    Other constipation    Personal history of malignant neoplasm of prostate    Personal history of other disorders of nervous system and sense organs    Meniere's disease    AVD (aortic valve disease) 02/25/2010   Long term (current) use of anticoagulants 09/15/2010   SHINGLES 11/26/2009     Home Meds: Prior to Admission medications   Medication Sig Start Date End Date Taking? Authorizing Provider  acetaminophen (TYLENOL) 325 MG tablet Take 650 mg by mouth as needed.     Yes Historical Provider, MD  aspirin EC 81 MG tablet Take 1 tablet (81 mg total) by mouth daily. 05/22/12  Yes Larey Dresser, MD  atorvastatin (LIPITOR) 40 MG tablet Take 1 tablet (40 mg total) by mouth daily. 02/05/13  Yes Larey Dresser, MD  magnesium hydroxide (MILK OF MAGNESIA) 400 MG/5ML suspension Take 30  mLs by mouth daily as needed for indigestion.    Yes Historical Provider, MD  sodium chloride (OCEAN) 0.65 % SOLN nasal spray Place 1 spray into both nostrils as needed for congestion.   Yes Historical Provider, MD  TRIAMTERENE-HCTZ PO Take by mouth. 37.5-25 mg take 1 daily.    Yes Historical Provider, MD  warfarin (COUMADIN) 5 MG tablet Take by mouth. Take 1 & 1/2 tablets (7.5mg ) by mouth on Mon, Tues, Weds.,  Thurs, Fri, Sun and take 1 tab (5mg ) on  Sat.   Yes Historical Provider, MD  oxymetazoline (AFRIN NASAL SPRAY) 0.05 % nasal spray Place 1 spray into both nostrils 2 (two) times daily for the next 2-3 days. 03/13/13   Margarita Mail, PA-C    Allergies:  Allergies  Allergen Reactions   Codeine     REACTION: N/V    History   Social History   Marital Status: Married    Spouse Name: N/A    Number of Children: 0   Years of Education: 74   Occupational History   retired        Social History Main Topics   Smoking status: Former Smoker    Types: Cigarettes    Quit date: 02/06/1972   Smokeless tobacco: Never Used   Alcohol Use: 3.5 oz/week    7 drink(s) per week     Comment: glass of wine daily   Drug Use: No   Sexual  Activity: No   Other Topics Concern   Not on file   Social History Narrative   HSG, a couple of years of college. Married '78 - . No children. Work - [employer] 23 years - retired.   Lives alone with wife and two dogs. ACP - Yes CPR, Yes - short-term mechanical ventilation. Does not want prolonged heroic measures in the face of irreversible disease. HCPOA - wife. Alternative is his brother: David Benton.      Review of Systems: Constitutional: negative for chills, fever, night sweats, weight changes, or fatigue  HEENT: negative for vision changes, hearing loss, congestion, rhinorrhea, ST, epistaxis, or sinus pressure Cardiovascular: negative for chest pain or palpitations Respiratory: negative for hemoptysis, wheezing, shortness of  breath, or cough Abdominal: negative for abdominal pain, nausea, vomiting, diarrhea, or constipation Dermatological: negative for rash Neurologic: negative for headache, dizziness, or syncope All other systems reviewed and are otherwise negative with the exception to those above and in the HPI.   Physical Exam: Blood pressure 126/70, pulse 70, temperature 98.2 F (36.8 C), temperature source Oral, resp. rate 16, height 5\' 10"  (1.778 m), weight 151 lb 9.6 oz (68.765 kg), SpO2 98.00%., Body mass index is 21.75 kg/(m^2). General: Well developed, well nourished, in no acute distress. Head: Normocephalic, atraumatic, eyes without discharge, sclera non-icteric, nares are without discharge. Bilateral auditory canals clear, TM's are without perforation, pearly grey and translucent with reflective cone of light bilaterally. Oral cavity moist, posterior pharynx without exudate, erythema, peritonsillar abscess, or post nasal drip.  Neck: Supple. No thyromegaly. Full ROM. No lymphadenopathy. Lungs: Clear bilaterally to auscultation without wheezes, rales, or rhonchi. Breathing is unlabored. Heart: RRR with S1 S2. No murmurs, rubs, or gallops appreciated. Abdomen: Soft, non-tender, non-distended with normoactive bowel sounds. No hepatomegaly. No rebound/guarding. No obvious abdominal masses. Msk:  Strength and tone normal for age. Extremities/Skin: Warm and dry. No clubbing or cyanosis. No edema. No rashes or suspicious lesions. Patient left elbow is markedly swollen with ecchymosis over the lateral epicondyle. Neuro: Alert and oriented X 3. Moves all extremities spontaneously. Gait is normal. CNII-XII grossly in tact. Psych:  Responds to questions appropriately with a normal affect.   [facility] reading (PRIMARY) by  Dr. Joseph Art:  Left elbow. STS only     ASSESSMENT AND PLAN:  80 y.o. year old male with lateral epicondylitis caused by hitting turf with golf club Ibuprofen and, soft dressing,  sling Recheck 4 days   Signed, Robyn Haber, MD 04/25/2013 5:08 PM

## 2013-04-28 ENCOUNTER — Ambulatory Visit (INDEPENDENT_AMBULATORY_CARE_PROVIDER_SITE_OTHER): Payer: Medicare Other | Admitting: Family Medicine

## 2013-04-28 VITALS — BP 142/54 | HR 66 | Temp 98.2°F | Resp 18 | Ht 70.0 in | Wt 150.0 lb

## 2013-04-28 DIAGNOSIS — M79609 Pain in unspecified limb: Secondary | ICD-10-CM

## 2013-04-28 DIAGNOSIS — S40029A Contusion of unspecified upper arm, initial encounter: Secondary | ICD-10-CM

## 2013-04-28 NOTE — Progress Notes (Signed)
This chart was scribed for Robyn Haber, MD, by Neta Ehlers, ED Scribe. This patient's care was started at 3:11 PM.   Patient ID: David Benton, male   DOB: 08-Oct-1929, 80 y.o.   MRN: [id number]      Patient Name: David Benton Date of Birth: September 19, 1929 Medical Record Number: [id number] Gender: male Date of Encounter: 04/28/2013  Chief Complaint: arm wound   History of Present Illness:  David Benton is a 80 y.o. very pleasant male patient who presents for a wound check to his right forearm. The pt was treated by Dr. Joseph Art several days ago for acute swelling and discoloration to his right forearm.  He reports continued swelling and discoloration of the right arm. However, he also reports improvement to his arm including an increased ability to straighten his arm.   The pt has taken coumadin since 1991. The pt also takes a low-dose of aspirin and he has had several bleeding issues; he had a four-hour long episode of epistaxis two months ago. David Benton has elected to abstain from  aspirin for four days, and he would prefer to cease aspirin.   Dr. Algernon Huxley is the pt's cardiologist.   Markedly swollen left brachioradialis with ecchymosis involving the entire ulnar forearm associated with mild swelling throughout  Patient Active Problem List   Diagnosis Date Noted  . Encounter for therapeutic drug monitoring 02/27/2013  . Head trauma 05/24/2011  . Routine health maintenance 01/23/2011  . Long term (current) use of anticoagulants 09/15/2010  . S/P aortic valve replacement 05/20/2010  . SHINGLES 11/26/2009  . Thoracic aneurysm without mention of rupture 09/29/2008  . AORTIC VALVE REPLACEMENT, HX OF 09/29/2008  . AORTIC STENOSIS, SEVERE 09/20/2008  . HEMORRHOIDS-EXTERNAL 09/12/2008  . PERSONAL HX COLONIC POLYPS 09/12/2008  . CONSTIPATION, CHRONIC 08/04/2008  . HYPERLIPIDEMIA 10/24/2007  . Unspecified essential hypertension 10/24/2007  . NEOPLASM, MALIGNANT, PROSTATE, HX  OF 10/24/2007  . MENIERE'S DISEASE, HX OF 10/24/2007   Past Medical History  Diagnosis Date  . Shingles   . Thoracic aneurysm without mention of rupture   . Heart valve replaced by other means   . Aortic valve disorders   . Unspecified essential hypertension   . Other and unspecified hyperlipidemia   . Personal history of colonic polyps   . External hemorrhoids without mention of complication   . Other constipation   . Personal history of malignant neoplasm of prostate   . Personal history of other disorders of nervous system and sense organs   . Meniere's disease   . AVD (aortic valve disease) 02/25/2010  . Long term (current) use of anticoagulants 09/15/2010  . SHINGLES 11/26/2009   Past Surgical History  Procedure Laterality Date  . Aortic valve replacement      31 [facility] jud[facility]'s valve  . Hemorrhoid surgery    . Prostatectomy  1995  . Tonsillectomy     History  Substance Use Topics  . Smoking status: Former Smoker    Types: Cigarettes    Quit date: 02/06/1972  . Smokeless tobacco: Never Used  . Alcohol Use: 3.5 oz/week    7 drink(s) per week     Comment: glass of wine daily   Family History  Problem Relation Age of Onset  . Heart attack Father     mother  . Colon cancer Neg Hx   . Prostate cancer Brother   . Diabetes Brother   . Stroke Brother   . Heart disease Brother     carotid  artery disease  . Hypertension Brother   . Cancer Sister     breast cancer; mastectomy at 74   Allergies  Allergen Reactions  . Codeine     REACTION: N/V    Medication list has been reviewed and updated.  Current Outpatient Prescriptions on File Prior to Visit  Medication Sig Dispense Refill  . acetaminophen (TYLENOL) 325 MG tablet Take 650 mg by mouth as needed.        Marland Kitchen aspirin EC 81 MG tablet Take 1 tablet (81 mg total) by mouth daily.      Marland Kitchen atorvastatin (LIPITOR) 40 MG tablet Take 1 tablet (40 mg total) by mouth daily.  30 tablet  3  . magnesium hydroxide (MILK OF MAGNESIA)  400 MG/5ML suspension Take 30 mLs by mouth daily as needed for indigestion.       . sodium chloride (OCEAN) 0.65 % SOLN nasal spray Place 1 spray into both nostrils as needed for congestion.      . TRIAMTERENE-HCTZ PO Take by mouth. 37.5-25 mg take 1 daily.       Marland Kitchen warfarin (COUMADIN) 5 MG tablet Take by mouth. Take 1 & 1/2 tablets (7.5mg ) by mouth on Mon, Tues, Weds.,  Thurs, Fri, Sun and take 1 tab (5mg ) on  Sat.      Marland Kitchen oxymetazoline (AFRIN NASAL SPRAY) 0.05 % nasal spray Place 1 spray into both nostrils 2 (two) times daily for the next 2-3 days.  30 mL  0   No current facility-administered medications on file prior to visit.    Review of Systems:  Positive: swelling and discoloration to right forearm  Physical Examination: Filed Vitals:   04/28/13 1457  BP: 142/54  Pulse: 66  Temp: 98.2 F (36.8 C)  Resp: 18   @vitals2 @ Body mass index is 21.52 kg/(m^2). Ideal Body Weight: @FLOWAMB ([id number])@  Swollen, indurated minimally tender left brachioradialis with diffuse forearm swelling and thickening  EKG / Labs / Xrays: None available at time of encounter  Assessment and Plan:  Informed pt that the discoloration and swelling to the arm should gradually improve for the next ten days.  Advised the pt to continue to use gauze to wrap the arm.  Agreed with pt that he should discontinue a daily aspirin.   Neta Ehlers, ED Scribe

## 2013-05-02 ENCOUNTER — Telehealth: Payer: Self-pay | Admitting: *Deleted

## 2013-05-02 MED ORDER — AMOXICILLIN 500 MG PO CAPS: ORAL_CAPSULE | ORAL | Status: DC

## 2013-05-02 NOTE — Telephone Encounter (Signed)
Message copied by Fernande Boyden on Thu May 02, 2013  9:37 AM ------      Message from: Larey Dresser      Created: Wed May 01, 2013 10:24 AM      Regarding: RE: dental work       OK to order      ----- Message -----         From: Fernande Boyden, RN         Sent: 04/30/2013   3:45 PM           To: Larey Dresser, MD, Fernande Boyden, RN      Subject: dental work                                              Patient having dental work, received request from [facility] for amoxicillin 500 mg quantity 12 take as directed prior to dental procedure. Is is OK to order, thanks, Lovett Sox, RN       ------

## 2013-05-08 ENCOUNTER — Ambulatory Visit (INDEPENDENT_AMBULATORY_CARE_PROVIDER_SITE_OTHER): Payer: Medicare Other | Admitting: *Deleted

## 2013-05-08 DIAGNOSIS — Z5181 Encounter for therapeutic drug level monitoring: Secondary | ICD-10-CM

## 2013-05-08 DIAGNOSIS — Z7901 Long term (current) use of anticoagulants: Secondary | ICD-10-CM

## 2013-05-08 DIAGNOSIS — Z954 Presence of other heart-valve replacement: Secondary | ICD-10-CM

## 2013-05-08 LAB — POCT INR: INR: 2.5

## 2013-05-13 ENCOUNTER — Telehealth: Payer: Self-pay | Admitting: *Deleted

## 2013-05-13 NOTE — Telephone Encounter (Signed)
Pt states he had some pain and swelling in left arm when playing golf in early April. Pain reoccurred on 04/25/13 and pt went to Urgent Care and saw Dr Joseph Art, at that time pt was instructed to use PRN Motrin and soft sling for Dx of Lateral Epicondylitis and return in  4 days for reevaluation.  Thus, on 04/28/13 pt returned, he had continued swelling and discoloration of Rt arm per Epic Note. He held his ASA for 4 days and preferred to completely discontinue his ASA and Dr Joseph Art agreed.

## 2013-05-21 ENCOUNTER — Ambulatory Visit (INDEPENDENT_AMBULATORY_CARE_PROVIDER_SITE_OTHER): Payer: Medicare Other | Admitting: Family Medicine

## 2013-05-21 ENCOUNTER — Encounter: Payer: Self-pay | Admitting: Family Medicine

## 2013-05-21 ENCOUNTER — Ambulatory Visit: Payer: Medicare Other | Admitting: Family Medicine

## 2013-05-21 VITALS — BP 138/73 | HR 62 | Temp 98.0°F | Ht 70.0 in | Wt 149.0 lb

## 2013-05-21 DIAGNOSIS — Z8546 Personal history of malignant neoplasm of prostate: Secondary | ICD-10-CM

## 2013-05-21 DIAGNOSIS — Z8669 Personal history of other diseases of the nervous system and sense organs: Secondary | ICD-10-CM

## 2013-05-21 DIAGNOSIS — Z952 Presence of prosthetic heart valve: Secondary | ICD-10-CM

## 2013-05-21 DIAGNOSIS — E785 Hyperlipidemia, unspecified: Secondary | ICD-10-CM

## 2013-05-21 DIAGNOSIS — I712 Thoracic aortic aneurysm, without rupture, unspecified: Secondary | ICD-10-CM

## 2013-05-21 DIAGNOSIS — Z954 Presence of other heart-valve replacement: Secondary | ICD-10-CM

## 2013-05-21 DIAGNOSIS — Z23 Encounter for immunization: Secondary | ICD-10-CM

## 2013-05-21 DIAGNOSIS — I10 Essential (primary) hypertension: Secondary | ICD-10-CM

## 2013-05-21 NOTE — Progress Notes (Signed)
   Subjective:    Patient ID: David Benton, male    DOB: 10/25/1929, 80 y.o.   MRN: [id number]  HPI 80 yr old male to establish after transferring from Dr. Linda Hedges. He feels great and has no concerns. [facility] to follow his warfarin dosing. He is on this for life sue to having a mechanical aortic valve replacement. He has a 4.6 cm ascending aortic aneurysm that appears to be stable, and this is followed closely by [name]. He has a hx of prostatectomy due to cancer, but his PSA remained at zero for 12 years so he no longer has PSA checks. His Menieres is under good control. He is retired and remains active.    Review of Systems  Constitutional: Negative.   Respiratory: Negative.   Cardiovascular: Negative.   Gastrointestinal: Negative.   Genitourinary: Negative.   Neurological: Negative.        Objective:   Physical Exam  Constitutional: He is oriented to person, place, and time. He appears well-developed and well-nourished.  Neck: No thyromegaly present.  Cardiovascular: Normal rate, regular rhythm and intact distal pulses.  Exam reveals no gallop and no friction rub.   2/6 holosystolic murmur with a click   Pulmonary/Chest: Effort normal and breath sounds normal.  Lymphadenopathy:    He has no cervical adenopathy.  Neurological: He is alert and oriented to person, place, and time.          Assessment & Plan:  He seems to be doing well. Given a TDaP today.

## 2013-05-22 ENCOUNTER — Telehealth: Payer: Self-pay | Admitting: Family Medicine

## 2013-05-22 NOTE — Telephone Encounter (Signed)
Relevant patient education assigned to patient using Emmi. ° °

## 2013-06-05 ENCOUNTER — Ambulatory Visit (INDEPENDENT_AMBULATORY_CARE_PROVIDER_SITE_OTHER): Payer: Medicare Other | Admitting: *Deleted

## 2013-06-05 DIAGNOSIS — Z954 Presence of other heart-valve replacement: Secondary | ICD-10-CM

## 2013-06-05 DIAGNOSIS — Z5181 Encounter for therapeutic drug level monitoring: Secondary | ICD-10-CM

## 2013-06-05 DIAGNOSIS — Z7901 Long term (current) use of anticoagulants: Secondary | ICD-10-CM

## 2013-06-05 LAB — POCT INR: INR: 2.3

## 2013-06-19 ENCOUNTER — Ambulatory Visit (INDEPENDENT_AMBULATORY_CARE_PROVIDER_SITE_OTHER): Payer: Medicare Other | Admitting: Surgery

## 2013-06-19 DIAGNOSIS — Z5181 Encounter for therapeutic drug level monitoring: Secondary | ICD-10-CM

## 2013-06-19 DIAGNOSIS — Z954 Presence of other heart-valve replacement: Secondary | ICD-10-CM

## 2013-06-19 DIAGNOSIS — Z7901 Long term (current) use of anticoagulants: Secondary | ICD-10-CM

## 2013-06-19 LAB — POCT INR: INR: 3.2

## 2013-06-21 ENCOUNTER — Other Ambulatory Visit: Payer: Self-pay | Admitting: Cardiology

## 2013-07-08 ENCOUNTER — Other Ambulatory Visit: Payer: Self-pay

## 2013-07-08 DIAGNOSIS — I712 Thoracic aortic aneurysm, without rupture, unspecified: Secondary | ICD-10-CM

## 2013-07-10 ENCOUNTER — Ambulatory Visit (INDEPENDENT_AMBULATORY_CARE_PROVIDER_SITE_OTHER): Payer: Medicare Other | Admitting: *Deleted

## 2013-07-10 DIAGNOSIS — Z954 Presence of other heart-valve replacement: Secondary | ICD-10-CM

## 2013-07-10 DIAGNOSIS — Z5181 Encounter for therapeutic drug level monitoring: Secondary | ICD-10-CM

## 2013-07-10 DIAGNOSIS — Z7901 Long term (current) use of anticoagulants: Secondary | ICD-10-CM

## 2013-07-10 LAB — POCT INR: INR: 2.4

## 2013-07-18 ENCOUNTER — Other Ambulatory Visit: Payer: Self-pay | Admitting: Cardiology

## 2013-07-30 ENCOUNTER — Other Ambulatory Visit: Payer: Self-pay | Admitting: *Deleted

## 2013-07-30 DIAGNOSIS — I712 Thoracic aortic aneurysm, without rupture, unspecified: Secondary | ICD-10-CM

## 2013-07-30 LAB — CREATININE, SERUM: Creat: 1.07 mg/dL (ref 0.50–1.35)

## 2013-07-30 LAB — BUN: BUN: 24 mg/dL — ABNORMAL HIGH (ref 6–23)

## 2013-07-31 ENCOUNTER — Ambulatory Visit (INDEPENDENT_AMBULATORY_CARE_PROVIDER_SITE_OTHER): Payer: Medicare Other | Admitting: *Deleted

## 2013-07-31 DIAGNOSIS — Z5181 Encounter for therapeutic drug level monitoring: Secondary | ICD-10-CM

## 2013-07-31 DIAGNOSIS — Z7901 Long term (current) use of anticoagulants: Secondary | ICD-10-CM

## 2013-07-31 DIAGNOSIS — Z954 Presence of other heart-valve replacement: Secondary | ICD-10-CM

## 2013-07-31 LAB — POCT INR: INR: 3.3

## 2013-08-01 ENCOUNTER — Encounter: Payer: Self-pay | Admitting: Cardiothoracic Surgery

## 2013-08-01 ENCOUNTER — Ambulatory Visit
Admission: RE | Admit: 2013-08-01 | Discharge: 2013-08-01 | Disposition: A | Discharge status: Home / Self Care | Payer: Medicare Other | Source: Ambulatory Visit | Attending: Cardiothoracic Surgery | Admitting: Cardiothoracic Surgery

## 2013-08-01 ENCOUNTER — Ambulatory Visit (INDEPENDENT_AMBULATORY_CARE_PROVIDER_SITE_OTHER): Payer: Medicare Other | Admitting: Cardiothoracic Surgery

## 2013-08-01 VITALS — BP 140/69 | HR 64 | Resp 16 | Ht 70.0 in | Wt 150.0 lb

## 2013-08-01 DIAGNOSIS — I712 Thoracic aortic aneurysm, without rupture, unspecified: Secondary | ICD-10-CM

## 2013-08-01 MED ORDER — IOHEXOL 350 MG/ML SOLN
80.0000 mL | Freq: Once | INTRAVENOUS | Status: AC | PRN
  Administered 2013-08-01: 80 mL via INTRAVENOUS

## 2013-08-01 NOTE — Progress Notes (Signed)
[address]       [place] [address]             [phone]               Hesham W Crowell [place] Medical Record #[id number] Date of Birth: Apr 14, 1929  Referring:  Dr Einar Crow Primary Care: [name], MD  Chief Complaint:    Chief Complaint  Patient presents with  . Follow-up    with CTA CHEST for surveillance of TAA    History of Present Illness:    Patient returns to the office today in followup after aortic valve replacement with a 29 mm St. Jude mechanical valve (29A-101  SN [id number]) in August of 1991 by Dr Redmond Pulling for aortic stenosis and bicuspid aortic valve. He was seen in April of 2009 because of aortic dilation on echocardiogram.  Since that time he's had serial CT scans to evaluate the size of his aorta . At age 80 he remains active exercising daily and continues to work as a Psychologist, occupational at [employer] . He just returned from 116 mile walking dictation [place] the [place] in [place]. He denied any difficulty in the physical exertion to do this He continues on Coumadin without evidence of significant bleeding complication .  He returns today with a followup CT scan of the chest one year followup  Current Activity/ Functional Status: Patient is  independent with mobility/ambulation, transfers, ADL's, IADL's.   Past Medical History  Diagnosis Date  . Shingles   . Thoracic aneurysm without mention of rupture     followed by [name] with yearly CT scans   . Heart valve replaced by other means   . Aortic valve disorders   . Unspecified essential hypertension   . Other and unspecified hyperlipidemia   . Personal history of colonic polyps   . External hemorrhoids without mention of complication   . Other constipation   . Personal history of malignant neoplasm of prostate   . Personal history of other disorders of nervous system and sense organs   . Meniere's disease   . AVD (aortic valve disease) 02/25/2010    sees [name]   . Long term (current) use of anticoagulants 09/15/2010  . SHINGLES 11/26/2009    Past Surgical History  Procedure Laterality Date  . Aortic valve replacement  1991     st. jude's valve, per [name]   . Hemorrhoid surgery    . Tonsillectomy    . Colonoscopy  06-28-10    per Dr. Henrene Pastor, benign polyps, no further scopes needed   . Prostatectomy  1995    per Dr. Roni Bread     Family History  Problem Relation Age of Onset  . Heart attack Father     mother  . Colon cancer Neg Hx   . Prostate cancer Brother   . Diabetes Brother   . Stroke Brother   . Heart disease Brother     carotid artery disease  . Hypertension Brother   . Cancer Sister     breast cancer; mastectomy at 92    History   Social History  . Marital Status: Married    Spouse Name: N/A    Number of Children: 0  . Years of Education: 13   Occupational History  . retired   .     Social History Main Topics  . Smoking status: Former Smoker  Types: Cigarettes    Quit date: 02/06/1972  . Smokeless tobacco: Never Used  . Alcohol Use: 3.5 oz/week    7 drink(s) per week     Comment: glass of wine daily  . Drug Use: No  . Sexual Activity: No   Other Topics Concern  . Not on file   Social History Narrative   HSG, a couple of years of college. Married '78 - . No children. Work - [employer] years - retired.   Lives alone with wife and two dogs. ACP - Yes CPR, Yes - short-term mechanical ventilation. Does not want prolonged heroic measures in the face of irreversible disease. HCPOA - wife. Alternative is his brother: Bryden Darden [facility].     History  Smoking status  . Former Smoker  . Types: Cigarettes  . Quit date: 02/06/1972  Smokeless tobacco  . Never Used    History  Alcohol Use  . 3.5 oz/week  . 7 drink(s) per week    Comment: glass of wine daily     Allergies  Allergen Reactions  . Codeine     REACTION: N/V    Current Outpatient Prescriptions  Medication Sig  Dispense Refill  . acetaminophen (TYLENOL) 325 MG tablet Take 650 mg by mouth as needed.       Marland Kitchen amoxicillin (AMOXIL) 500 MG capsule Take as directed by dentist prior to procedure order states 4 po every 1 hour prior to dental appointment  12 capsule  1  . atorvastatin (LIPITOR) 40 MG tablet TAKE ONE TABLET BY MOUTH EVERY DAY  30 tablet  0  . magnesium hydroxide (MILK OF MAGNESIA) 400 MG/5ML suspension Take 30 mLs by mouth daily as needed for indigestion.       . sodium chloride (OCEAN) 0.65 % SOLN nasal spray Place 1 spray into both nostrils as needed for congestion.      . TRIAMTERENE-HCTZ PO Take by mouth. 37.5-25 mg take 1 daily.       Marland Kitchen warfarin (COUMADIN) 5 MG tablet Take as directed by [facility]  45 tablet  3   No current facility-administered medications for this visit.       Review of Systems:     Cardiac Review of Systems: Y or N  Chest Pain [  n ]  Resting SOB [n   ] Exertional SOB  [  n]  Orthopnea Florencio.Farrier  ]   Pedal Edema [ n  ]    Palpitations [n  ] Syncope  Florencio.Farrier  ]   Presyncope [ n  ]  General Review of Systems: [Y] = yes [  ]=no Constitional: recent weight change [  ]; anorexia [  ]; fatigue [  ]; nausea [  ]; night sweats [  ]; fever [  ]; or chills [  ];  Dental: poor dentition[ N ]; Last Dentist visit:FALL 2013   Eye : blurred vision [  ]; diplopia [   ]; vision changes [  ];  Amaurosis fugax[  ]; Resp: cough [  ];  wheezing[  ];  hemoptysis[  ]; shortness of breath[  ]; paroxysmal nocturnal dyspnea[  ]; dyspnea on exertion[  ]; or orthopnea[  ];  GI:  gallstones[  ], vomiting[  ];  dysphagia[  ]; melena[  ];  hematochezia [  ]; heartburn[  ];   Hx of  Colonoscopy[ NOT NEEDED ANY MORE ]; GU: kidney stones [  ]; hematuria[  ];   dysuria [  ];  nocturia[  ];  history of     obstruction [  ];             Skin: rash, swelling[  ];, hair loss[   ];  peripheral edema[  ];  or itching[  ]; Musculosketetal: myalgias[  ];  joint swelling[  ];  joint erythema[  ];  joint pain[  ];  back pain[  ];  Heme/Lymph: bruising[  ];  bleeding[  ];  anemia[  ];  Neuro: TIA[  ];  headaches[  ];  stroke[  ];  vertigo[  ];  seizures[  ];   paresthesias[  ];  difficulty walking[  ];  Psych:depression[  ]; anxiety[  ];  Endocrine: diabetes[  ];  thyroid dysfunction[  ];  Immunizations: Flu [ y  ]; Pneumococcal[ y IN [place]  ];  Other:  Physical Exam: BP 140/69  Pulse 64  Resp 16  Ht 5\' 10"  (1.778 m)  Wt 150 lb (68.04 kg)  BMI 21.52 kg/m2  SpO2 98%  General appearance: alert and cooperative Neurologic: intact Heart: regular rate and rhythm, S1, S2 normal, no murmur, crisp valve sound click no murmur of aortic insufficiency, rub or gallop and normal apical impulse Lungs: clear to auscultation bilaterally and normal percussion bilaterally Abdomen: soft, non-tender; bowel sounds normal; no masses,  no organomegaly Extremities: extremities normal, atraumatic, no cyanosis or edema Wound: Sternum is stable   Diagnostic Studies & Laboratory data:     Recent Radiology Findings: Ct Angio Chest Aorta W/cm &/or Wo/cm  08/01/2013   CLINICAL DATA:  Thoracic aortic aneurysmal disease with history of aortic valve replacement.  EXAM: CT ANGIOGRAPHY CHEST WITH CONTRAST  TECHNIQUE: Multidetector CT imaging of the chest was performed using the standard protocol during bolus administration of intravenous contrast. Multiplanar CT image reconstructions and MIPs were obtained to evaluate the vascular anatomy.  CONTRAST:  28mL OMNIPAQUE IOHEXOL 350 MG/ML SOLN  COMPARISON:  02/16/2012 and other prior CTA studies.  FINDINGS: Measured maximal diameter of the ascending thoracic aorta is 4.3 cm. The 4.6 cm measurement previously may have been slightly overestimated. There is no evidence of aortic dissection or intramural hemorrhage. The proximal arch measures 3.3 cm. The  distal arch measures 3.2 cm. The descending thoracic aorta measures 3.2 cm. Stable mild atherosclerotic plaque involving the arch and proximal great vessels without evidence of ulcerated plaque or penetrating ulcer. Proximal great vessels showed no significant stenosis.  The heart size appears stable. There is no evidence of pleural or pericardial fluid. Lungs show no evidence of edema, consolidation or nodule. No enlarged lymph nodes are identified. Stable calcified plaque in the distribution of the LAD and left circumflex coronary arteries. Visualized upper abdomen is unremarkable. Bony structures are unremarkable.  Review of the MIP images confirms the above findings.  IMPRESSION: No  further enlargement of the ascending thoracic aorta. Current maximal diameter measurement is 4.3 cm by CTA and appears to be stable compared to the 06/2010 study. Previous measurement of 4.6 cm may have been slightly overestimated based on axis of measurement.   Electronically Signed   By: Aletta Edouard M.D.   On: 08/01/2013 13:36    Ct Angio Chest Aorta W/cm &/or Wo/cm  08/14/2012   *RADIOLOGY REPORT*  Clinical Data:  52-month follow-up thoracic aortic aneurysm and liver lesion.  CT ANGIOGRAPHY CHEST  Technique:  Multidetector CT imaging of the chest using the standard protocol during bolus administration of intravenous contrast. Multiplanar reconstructed images including MIPs were obtained and reviewed to evaluate the vascular anatomy.  Contrast: 80mL OMNIPAQUE IOHEXOL 350 MG/ML SOLN  Comparison: Prior CTA of the chest 02/16/2012  Findings:  Mediastinum: Unremarkable CT appearance of the thyroid gland.  No suspicious mediastinal or hilar adenopathy.  No soft tissue mediastinal mass.  The thoracic esophagus is unremarkable.  Heart/Vascular: Surgical changes of mechanical aortic valve repair. There is persistent dilatation of the aortic root up to 4.3 cm at the sinuses of Valsalva.  This is not significantly changed compared to  prior.  The aorta measures approximately 4.2 cm at the sinotubular junction.  The ascending aorta is aneurysmally dilated with a maximal diameter of 4.6 cm, unchanged compared to prior. The transverse aorta measures 3.4 cm.  The proximal descending thoracic aorta measures 3.6 cm.  Scattered atherosclerotic vascular calcifications.  Extensive atherosclerotic calcifications noted throughout the coronary arteries. Mild ectasia of the right subclavian artery to 1.6 cm. Stable borderline cardiomegaly.  Lungs/Pleura: Trace biapical pleural parenchymal scarring.  Trace dependent atelectasis.  The lungs are otherwise clear.  Upper Abdomen: Stable 6 - 7 mm hypoattenuating lesion in the posterior aspect of hepatic segments six.  No significant interval growth.  Sludge and/or small stones layer within the gallbladder lumen.  Bilateral renal cortical thinning with scattered areas of scarring.  Otherwise, unremarkable upper abdomen.  Bones: No acute fracture or aggressive appearing lytic or blastic osseous lesion.  T12 hemangioma.  Healed median sternotomy.  IMPRESSION:  1.  Stable ascending aortic aneurysm with maximal diameter of the ascending tubular segment at 4.6 cm and  maximal diameter of the sinotubular junction 4.3 cm.  2.  Stable sub centimeter hypoattenuating lesion in the posterior right hepatic lobe.  In retrospect, this finding was likely present in 2012 (although it cannot be definitively identified on studies from 2010 or 2009) and is very likely benign. Recommend continued attention on routine follow-up imaging.  3.  Sludge and / or small stones in the gallbladder lumen.  4.  Atherosclerosis including multivessel coronary artery disease.  Signed,  Criselda Peaches, MD Vascular & Interventional Radiologist [facility] Radiology   Original Report Authenticated By: Jacqulynn Cadet, M.D.     Ct Angio Chest W/cm &/or Wo Cm  02/16/2012  *RADIOLOGY REPORT*  Clinical Data: Thoracic aortic aneurysm  CT ANGIOGRAPHY  CHEST  Technique:  Multidetector CT imaging of the chest using the standard protocol during bolus administration of intravenous contrast. Multiplanar reconstructed images including MIPs were obtained and reviewed to evaluate the vascular anatomy.  Contrast: 182mL OMNIPAQUE IOHEXOL 350 MG/ML SOLN  Comparison: 06/24/2010  Findings: Aneurysmal dilatation of the ascending aorta has slightly increased.  Diameters at the sinus of Valsalva, sinotubular junction, and ascending aorta are 4.4 cm, 4.3 cm, and 4.6 cm. Previously, maximal diameter in the sinus of Valsalva was 4.3 cm while the ascending aorta was  4.2 cm.  Aortic valve replacement hardware remains stable in position.  Mitral valve calcifications are noted coronary artery calcifications in the left main, left anterior descending, and circumflex arteries are noted.  Mild atherosclerotic changes at the origin of the left common carotid artery and left subclavian artery are present without significant narrowing.  Innominate artery and right subclavian artery are ectatic measuring 1.8 and 1.8 cm in caliber respectively.  Subclavian arteries are patent.  Visualized common carotid arteries are patent.  Vertebral arteries are patent.  Left vertebral is diminutive.  No obvious filling defect in the pulmonary arterial tree to suggest acute pulmonary thromboembolism.  No evidence of aortic transection or dissection.  Limited images of the abdomen demonstrate mild narrowing at the origin of the celiac axis with the appearance of median arcuate ligament syndrome.  SMA is patent.  Branch vessels of the celiac axis are grossly patent.  Atherosclerotic changes at the origin of both renal arteries is present.  Degree of narrowing is difficult to determine secondary to calcifications.  No evidence of abnormal mediastinal, hilar, or axillary adenopathy. No pericardial effusion.  No pneumothorax.  No pleural effusion.  Diffuse centrilobular emphysema is not significantly changed.  No  new mass or consolidation.  Stable parenchymal scarring at the lung apices.  Bilateral renal scarring.  Adrenal glands are within normal limits. New sub centimeter low density lesion in the right lobe of the liver has developed on image 131.  No destructive bone lesion.  T12 angioma is noted.  IMPRESSION: Aneurysmal dilatation has slightly increased.  Maximal diameter of the ascending aorta is 4.6 cm and was previously 4.2 cm.  Maximal diameter at the sinus of Valsalva is 4.4 cm and was previously 4.3 cm.  New sub centimeter liver lesion in the right lobe has developed. It is benign appearing but because it is new, follow-up CT or MRI of the liver in 6 months is recommended to ensure stability.  It may have been obscured on prior studies secondary to phase of liver enhancement.   Original Report Authenticated By: Marybelle Killings, M.D.       Recent Lab Findings: Lab Results  Component Value Date   WBC 4.2 03/13/2013   HGB 13.4 03/13/2013   HCT 40.3 03/13/2013   PLT 205 03/13/2013   GLUCOSE 92 03/07/2013   CHOL 148 04/08/2013   TRIG 79.0 04/08/2013   HDL 42.30 04/08/2013   LDLCALC 90 04/08/2013   ALT 21 04/08/2013   AST 19 04/08/2013   NA 139 03/07/2013   K 4.4 03/07/2013   CL 103 03/07/2013   CREATININE 1.07 07/30/2013   BUN 24* 07/30/2013   CO2 30 03/07/2013   TSH 3.80 11/20/2009   INR 3.3 07/31/2013   Aortic Size Index=     4.6    /There is no weight on file to calculate BSA. =  2.51  < 2.75 cm/m2      4% risk per year 2.75 to 4.25          8% risk per year > 4.25 cm/m2    20% risk per year  cross sectional area of aorta cm2/height in meters  16.6/1.77= 9.3   Assessment / Plan:    1 status post replacement of aortic valve with 29 mm mechanical St. Jude .  Aneurysmal dilatation of the ascending thoracic aorta which measures up to 4.6cm in diameter on today's examination.     On review of the CT scan of the chest the ascending aorta appears  basically stable still well under 5 cm in size .  I plan  to see the patient back in one year with a followup CTA of the chest to follow his dilated ascending aorta He continues to be diligent about his dental care and is aware of endocarditis precautions    Grace Isaac MD  Beeper [phone] Office [phone] 08/01/2013 12:09 PM

## 2013-08-19 ENCOUNTER — Other Ambulatory Visit: Payer: Self-pay | Admitting: Cardiology

## 2013-08-28 ENCOUNTER — Ambulatory Visit (INDEPENDENT_AMBULATORY_CARE_PROVIDER_SITE_OTHER): Payer: Medicare Other | Admitting: *Deleted

## 2013-08-28 DIAGNOSIS — Z954 Presence of other heart-valve replacement: Secondary | ICD-10-CM

## 2013-08-28 DIAGNOSIS — Z5181 Encounter for therapeutic drug level monitoring: Secondary | ICD-10-CM

## 2013-08-28 DIAGNOSIS — Z7901 Long term (current) use of anticoagulants: Secondary | ICD-10-CM

## 2013-08-28 LAB — POCT INR: INR: 3.4

## 2013-09-16 ENCOUNTER — Other Ambulatory Visit: Payer: Self-pay | Admitting: Cardiology

## 2013-09-24 ENCOUNTER — Other Ambulatory Visit: Payer: Self-pay | Admitting: Cardiology

## 2013-09-25 ENCOUNTER — Ambulatory Visit (INDEPENDENT_AMBULATORY_CARE_PROVIDER_SITE_OTHER): Payer: Medicare Other | Admitting: *Deleted

## 2013-09-25 DIAGNOSIS — Z7901 Long term (current) use of anticoagulants: Secondary | ICD-10-CM

## 2013-09-25 DIAGNOSIS — Z954 Presence of other heart-valve replacement: Secondary | ICD-10-CM

## 2013-09-25 DIAGNOSIS — Z5181 Encounter for therapeutic drug level monitoring: Secondary | ICD-10-CM

## 2013-09-25 LAB — POCT INR: INR: 3

## 2013-10-10 ENCOUNTER — Encounter: Payer: Self-pay | Admitting: Cardiology

## 2013-10-10 ENCOUNTER — Ambulatory Visit (INDEPENDENT_AMBULATORY_CARE_PROVIDER_SITE_OTHER): Payer: Medicare Other | Admitting: Cardiology

## 2013-10-10 ENCOUNTER — Encounter: Payer: Self-pay | Admitting: Family Medicine

## 2013-10-10 VITALS — BP 118/60 | HR 71 | Ht 70.0 in | Wt 149.0 lb

## 2013-10-10 DIAGNOSIS — I359 Nonrheumatic aortic valve disorder, unspecified: Secondary | ICD-10-CM

## 2013-10-10 DIAGNOSIS — Z952 Presence of prosthetic heart valve: Secondary | ICD-10-CM

## 2013-10-10 DIAGNOSIS — E785 Hyperlipidemia, unspecified: Secondary | ICD-10-CM

## 2013-10-10 DIAGNOSIS — I712 Thoracic aortic aneurysm, without rupture, unspecified: Secondary | ICD-10-CM

## 2013-10-10 DIAGNOSIS — Z954 Presence of other heart-valve replacement: Secondary | ICD-10-CM

## 2013-10-10 LAB — CBC WITH DIFFERENTIAL/PLATELET
BASOS PCT: 0.9 % (ref 0.0–3.0)
Basophils Absolute: 0.1 10*3/uL (ref 0.0–0.1)
EOS ABS: 0.2 10*3/uL (ref 0.0–0.7)
EOS PCT: 3.1 % (ref 0.0–5.0)
HEMATOCRIT: 39.7 % (ref 39.0–52.0)
Hemoglobin: 13.3 g/dL (ref 13.0–17.0)
LYMPHS ABS: 1.1 10*3/uL (ref 0.7–4.0)
Lymphocytes Relative: 19.2 % (ref 12.0–46.0)
MCHC: 33.5 g/dL (ref 30.0–36.0)
MCV: 90.1 fl (ref 78.0–100.0)
Monocytes Absolute: 0.6 10*3/uL (ref 0.1–1.0)
Monocytes Relative: 10.3 % (ref 3.0–12.0)
Neutro Abs: 3.7 10*3/uL (ref 1.4–7.7)
Neutrophils Relative %: 66.5 % (ref 43.0–77.0)
PLATELETS: 234 10*3/uL (ref 150.0–400.0)
RBC: 4.41 Mil/uL (ref 4.22–5.81)
RDW: 14.3 % (ref 11.5–15.5)
WBC: 5.5 10*3/uL (ref 4.0–10.5)

## 2013-10-10 LAB — BASIC METABOLIC PANEL
BUN: 22 mg/dL (ref 6–23)
CALCIUM: 9.2 mg/dL (ref 8.4–10.5)
CO2: 31 mEq/L (ref 19–32)
Chloride: 102 mEq/L (ref 96–112)
Creatinine, Ser: 1.2 mg/dL (ref 0.4–1.5)
GFR: 60.99 mL/min (ref >60.00)
Glucose, Bld: 100 mg/dL — ABNORMAL HIGH (ref 70–99)
Potassium: 3.9 mEq/L (ref 3.5–5.1)
Sodium: 138 mEq/L (ref 135–145)

## 2013-10-10 NOTE — Patient Instructions (Signed)
Your physician recommends that you have lab work today--CBCd/BMET.  Your physician wants you to follow-up in: 6 months with Dr Aundra Dubin. (March 2016). You will receive a reminder letter in the mail two months in advance. If you don't receive a letter, please call our office to schedule the follow-up appointment.

## 2013-10-10 NOTE — Progress Notes (Signed)
Patient ID: David Benton, male   DOB: 1929/06/18, 80 y.o.   MRN: [id number] PCP: Dr. Sarajane Jews  80 y.o with history of St Jude mechanical aortic valve (AS, bicuspid aortic valve) and thoracic aortic aneurysm presents for cardiology followup.  Echo in 5/14 showed normal EF and stable mechanical aortic valve.  CTA chest in 7/15 showed 4.3 cm thoracic aortic aneurysm.  He has been doing well recently.  No exertional dyspnea or chest pain.  He tries to walk a 10K once a week.  He golfs occasionally.  I tried to get him on ASA 81 daily given mechanical valve but he had severe nosebleeds and also developed a large hematoma on his left arm after playing golf, so he has stopped it.   ECG: NSR, 1st degree AV block, iRBBB, LAFB, septal Qs  Labs (2/14): LDL 65, HDL 48, K 4.2, creatinine 1.1 Labs (7/14): creatinine 1.13 Labs (3/15): LDL 90, HDL 42  PMH: 1. Prostate cancer s/p prostectomy in 1995 2. Meniere's disease 3. Shingles 4. St Jude mechanical aortic valve #29 placed in 1991.  Patient had AS with bicuspid aortic valve. Echo (9/08) with EF 60%, normal mechanical aortic valve.  Echo (5/14) with EF 60%, mechanical aortic valve with normal function. Unable to tolerate ASA in addition to coumadin.  5. H/o CCY 6. Hyperlipidemia 7. Thoracic aortic aneurysm: Ascending aorta 4.5 cm by CTA in 1/14.  Ascending aorta 4.6 cm by CTA in 7/14.  Ascending aorta 4.3 cm by CTA in 7/15.  Followed by [name].   FH:  Father with MI  SH: Married, prior smoker.  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  Lives in [place].  ROS: All systems reviewed and negative except as per HPI.   Current Outpatient Prescriptions  Medication Sig Dispense Refill  . acetaminophen (TYLENOL) 325 MG tablet Take 650 mg by mouth as needed.       Marland Kitchen amoxicillin (AMOXIL) 500 MG capsule Take as directed by dentist prior to procedure order states 4 po every 1 hour prior to dental appointment  12 capsule  1  .  atorvastatin (LIPITOR) 40 MG tablet TAKE ONE TABLET BY MOUTH EVERY DAY  30 tablet  0  . magnesium hydroxide (MILK OF MAGNESIA) 400 MG/5ML suspension Take 30 mLs by mouth daily as needed for indigestion.       . Saline GEL Place into the nose daily as needed (as directed).      . sodium chloride (OCEAN) 0.65 % SOLN nasal spray Place 1 spray into both nostrils as needed for congestion.      . TRIAMTERENE-HCTZ PO Take by mouth. 37.5-25 mg take 1 daily.       Marland Kitchen warfarin (COUMADIN) 5 MG tablet TAKE AS DIRECTED by coumadin clinic FOR 30 DAYS.  45 tablet  3   No current facility-administered medications for this visit.    BP 118/60  Pulse 71  Ht 5\' 10"  (1.778 m)  Wt 149 lb (67.586 kg)  BMI 21.38 kg/m2 General: NAD Neck: No JVD, no thyromegaly or thyroid nodule.  Lungs: Clear to auscultation bilaterally with normal respiratory effort. CV: Nondisplaced PMI.  Heart regular S1/S2, no S3/S4, mechanical S2, 1/6 SEM RUSB.  No peripheral edema.  No carotid bruit.  Normal pedal pulses.  Abdomen: Soft, nontender, no hepatosplenomegaly, no distention.   Neurologic: Alert and oriented x 3.  Psych: Normal affect. Extremities: No clubbing or cyanosis.   Assessment/Plan: 1. Mechanical aortic valve: No  particular complaints.  Crisp valve sound.  He is on coumadin.  ASA was stopped due to severe nosebleeds and a forearm hematoma.  Valve looked ok by echo in 5/14, will repeat echo in 1 year.  Will check CBC.  2. Thoracic aortic aneurysm: Bicuspid aortic valve disorder with AS (now corrected) and thoracic aortic aneurysm.  4.3 cm ascending aortic aneurysm in 7/15.  Follows regularly with [name].  BP is controlled.   3. Hyperlipidemia: Good lipids in 3/15.   Loralie Champagne 10/10/2013

## 2013-10-21 ENCOUNTER — Other Ambulatory Visit: Payer: Self-pay | Admitting: Cardiology

## 2013-11-06 ENCOUNTER — Ambulatory Visit (INDEPENDENT_AMBULATORY_CARE_PROVIDER_SITE_OTHER): Payer: Medicare Other | Admitting: *Deleted

## 2013-11-06 DIAGNOSIS — Z7901 Long term (current) use of anticoagulants: Secondary | ICD-10-CM

## 2013-11-06 DIAGNOSIS — Z5181 Encounter for therapeutic drug level monitoring: Secondary | ICD-10-CM

## 2013-11-06 DIAGNOSIS — Z954 Presence of other heart-valve replacement: Secondary | ICD-10-CM

## 2013-11-06 DIAGNOSIS — Z952 Presence of prosthetic heart valve: Secondary | ICD-10-CM

## 2013-11-06 LAB — POCT INR: INR: 2.6

## 2013-12-18 ENCOUNTER — Ambulatory Visit (INDEPENDENT_AMBULATORY_CARE_PROVIDER_SITE_OTHER): Payer: Medicare Other | Admitting: *Deleted

## 2013-12-18 DIAGNOSIS — Z954 Presence of other heart-valve replacement: Secondary | ICD-10-CM

## 2013-12-18 DIAGNOSIS — Z7901 Long term (current) use of anticoagulants: Secondary | ICD-10-CM

## 2013-12-18 DIAGNOSIS — Z952 Presence of prosthetic heart valve: Secondary | ICD-10-CM

## 2013-12-18 DIAGNOSIS — Z5181 Encounter for therapeutic drug level monitoring: Secondary | ICD-10-CM

## 2013-12-18 LAB — POCT INR: INR: 2.8

## 2014-01-15 ENCOUNTER — Other Ambulatory Visit: Payer: Self-pay | Admitting: Cardiology

## 2014-01-29 ENCOUNTER — Ambulatory Visit (INDEPENDENT_AMBULATORY_CARE_PROVIDER_SITE_OTHER): Payer: Medicare Other | Admitting: *Deleted

## 2014-01-29 DIAGNOSIS — Z952 Presence of prosthetic heart valve: Secondary | ICD-10-CM

## 2014-01-29 DIAGNOSIS — Z954 Presence of other heart-valve replacement: Secondary | ICD-10-CM | POA: Diagnosis not present

## 2014-01-29 DIAGNOSIS — Z7901 Long term (current) use of anticoagulants: Secondary | ICD-10-CM

## 2014-01-29 DIAGNOSIS — Z5181 Encounter for therapeutic drug level monitoring: Secondary | ICD-10-CM

## 2014-01-29 LAB — POCT INR: INR: 2.6

## 2014-02-14 ENCOUNTER — Other Ambulatory Visit: Payer: Self-pay | Admitting: Cardiology

## 2014-03-12 ENCOUNTER — Ambulatory Visit (INDEPENDENT_AMBULATORY_CARE_PROVIDER_SITE_OTHER): Payer: Medicare Other | Admitting: *Deleted

## 2014-03-12 DIAGNOSIS — Z5181 Encounter for therapeutic drug level monitoring: Secondary | ICD-10-CM | POA: Diagnosis not present

## 2014-03-12 DIAGNOSIS — Z954 Presence of other heart-valve replacement: Secondary | ICD-10-CM | POA: Diagnosis not present

## 2014-03-12 DIAGNOSIS — Z952 Presence of prosthetic heart valve: Secondary | ICD-10-CM

## 2014-03-12 DIAGNOSIS — Z7901 Long term (current) use of anticoagulants: Secondary | ICD-10-CM

## 2014-03-12 LAB — POCT INR: INR: 2.5

## 2014-04-07 ENCOUNTER — Ambulatory Visit (INDEPENDENT_AMBULATORY_CARE_PROVIDER_SITE_OTHER): Payer: Medicare Other | Admitting: Cardiology

## 2014-04-07 ENCOUNTER — Encounter: Payer: Self-pay | Admitting: *Deleted

## 2014-04-07 ENCOUNTER — Ambulatory Visit: Payer: Medicare Other | Admitting: Cardiology

## 2014-04-07 ENCOUNTER — Encounter: Payer: Self-pay | Admitting: Cardiology

## 2014-04-07 VITALS — BP 134/70 | HR 69 | Ht 70.0 in | Wt 148.0 lb

## 2014-04-07 DIAGNOSIS — Z0000 Encounter for general adult medical examination without abnormal findings: Secondary | ICD-10-CM

## 2014-04-07 DIAGNOSIS — H53122 Transient visual loss, left eye: Secondary | ICD-10-CM

## 2014-04-07 DIAGNOSIS — I359 Nonrheumatic aortic valve disorder, unspecified: Secondary | ICD-10-CM

## 2014-04-07 DIAGNOSIS — G459 Transient cerebral ischemic attack, unspecified: Secondary | ICD-10-CM | POA: Diagnosis not present

## 2014-04-07 DIAGNOSIS — I712 Thoracic aortic aneurysm, without rupture, unspecified: Secondary | ICD-10-CM

## 2014-04-07 DIAGNOSIS — H53129 Transient visual loss, unspecified eye: Secondary | ICD-10-CM | POA: Insufficient documentation

## 2014-04-07 DIAGNOSIS — I10 Essential (primary) hypertension: Secondary | ICD-10-CM

## 2014-04-07 NOTE — Patient Instructions (Signed)
Your physician has requested that you have a carotid duplex. This test is an ultrasound of the carotid arteries in your neck. It looks at blood flow through these arteries that supply the brain with blood. Allow one hour for this exam. There are no restrictions or special instructions. ASAP  Your physician has requested that you have an echocardiogram. Echocardiography is a painless test that uses sound waves to create images of your heart. It provides your doctor with information about the size and shape of your heart and how well your heart's chambers and valves are working. This procedure takes approximately one hour. There are no restrictions for this procedure. September 2016  Your physician wants you to follow-up in: 6 months with Dr Aundra Dubin. (September 2016).  You will receive a reminder letter in the mail two months in advance. If you don't receive a letter, please call our office to schedule the follow-up appointment.

## 2014-04-07 NOTE — Progress Notes (Signed)
Patient ID: David Benton, male   DOB: 1929-10-06, 81 y.o.   MRN: [id number] PCP: Dr. Sarajane Jews  81 yo with history of St Jude mechanical aortic valve (AS, bicuspid aortic valve) and thoracic aortic aneurysm presents for cardiology followup.  Echo in 5/14 showed normal EF and stable mechanical aortic valve.  CTA chest in 7/15 showed 4.3 cm thoracic aortic aneurysm.  No exertional dyspnea or chest pain.  He tries to walk a 10K once a week.  He golfs 1-2 times a week.  I tried to get him on ASA 81 daily given mechanical valve but he had severe nosebleeds and also developed a large hematoma on his left arm after playing golf, so he stopped it. He volunteers at [facility].    About a month ago, he had transient (few seconds) loss of vision in the left eye.  This has happened about 3 times over the last 10 years.  No recurrence.   ECG: NSR, 1st degree AV block, iRBBB, LAFB, septal Qs  Labs (2/14): LDL 65, HDL 48, K 4.2, creatinine 1.1 Labs (7/14): creatinine 1.13 Labs (3/15): LDL 90, HDL 42 Labs (9/15): K 3.9, creatinine 1.2, HCT 39.7  PMH: 1. Prostate cancer s/p prostectomy in 1995 2. Meniere's disease 3. Shingles 4. St Jude mechanical aortic valve #29 placed in 1991.  Patient had AS with bicuspid aortic valve. Echo (9/08) with EF 60%, normal mechanical aortic valve.  Echo (5/14) with EF 60%, mechanical aortic valve with normal function. Unable to tolerate ASA in addition to coumadin.  5. H/o CCY 6. Hyperlipidemia 7. Thoracic aortic aneurysm: Ascending aorta 4.5 cm by CTA in 1/14.  Ascending aorta 4.6 cm by CTA in 7/14.  Ascending aorta 4.3 cm by CTA in 7/15.  Followed by [name].  8. Transient monocular blindness  FH:  Father with MI  SH: Married, prior smoker.  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  Lives in [place].  ROS: All systems reviewed and negative except as per HPI.   Current Outpatient Prescriptions  Medication Sig Dispense Refill  .  acetaminophen (TYLENOL) 325 MG tablet Take 650 mg by mouth as needed.     Marland Kitchen amoxicillin (AMOXIL) 500 MG capsule Take as directed by dentist prior to procedure order states 4 po every 1 hour prior to dental appointment 12 capsule 1  . atorvastatin (LIPITOR) 40 MG tablet TAKE ONE TABLET BY MOUTH EVERY DAY 30 tablet 6  . magnesium hydroxide (MILK OF MAGNESIA) 400 MG/5ML suspension Take 30 mLs by mouth daily as needed for indigestion.     . Saline GEL Place into the nose daily as needed (as directed).    . sodium chloride (OCEAN) 0.65 % SOLN nasal spray Place 1 spray into both nostrils as needed for congestion.    . TRIAMTERENE-HCTZ PO Take by mouth. 37.5-25 mg take 1 daily.     Marland Kitchen warfarin (COUMADIN) 5 MG tablet TAKE AS DIRECTED by coumadin clinic FOR 30 DAYS. 45 tablet 3   No current facility-administered medications for this visit.    BP 134/70 mmHg  Pulse 69  Ht 5\' 10"  (1.778 m)  Wt 148 lb (67.132 kg)  BMI 21.24 kg/m2 General: NAD Neck: No JVD, no thyromegaly or thyroid nodule.  Lungs: Clear to auscultation bilaterally with normal respiratory effort. CV: Nondisplaced PMI.  Heart regular S1/S2, no S3/S4, mechanical S2, 1/6 SEM RUSB.  No peripheral edema.  No carotid bruit.  Normal pedal pulses.  Abdomen:  Soft, nontender, no hepatosplenomegaly, no distention.   Neurologic: Alert and oriented x 3.  Psych: Normal affect. Extremities: No clubbing or cyanosis.   Assessment/Plan: 1. Mechanical aortic valve:  Crisp valve sound.  He is on coumadin with goal INR 2.5-3.5.  ASA was stopped due to severe nosebleeds and a forearm hematoma.  Valve looked ok by echo in 5/14, will repeat echo in 9/16.  Has physical with Dr Sarajane Jews in a few weeks, will get CBC then.  2. Thoracic aortic aneurysm: Bicuspid aortic valve disorder with AS (now corrected) and thoracic aortic aneurysm.  4.3 cm ascending aortic aneurysm in 7/15.  Follows regularly with [name].  BP is controlled.   3. Hyperlipidemia: Good lipids  in 3/15, to be repeated at physical in a few weeks. 4. Transient left eye blindness: Lasted a few seconds.  Concerning for amaurosis fugax.  Also could be related to embolism from valve.  He has been taking his coumadin as ordered.  - Continue coumadin, INR goal 2.5-3.5.  Has not been able to tolerate ASA 81.  - I will arrange for carotid doppler evaluation. - To see eye doctor.   Loralie Champagne 04/07/2014

## 2014-04-08 ENCOUNTER — Other Ambulatory Visit (HOSPITAL_COMMUNITY): Payer: Self-pay | Admitting: *Deleted

## 2014-04-08 ENCOUNTER — Ambulatory Visit (HOSPITAL_COMMUNITY): Payer: Medicare Other | Attending: Cardiology | Admitting: *Deleted

## 2014-04-08 DIAGNOSIS — H53122 Transient visual loss, left eye: Secondary | ICD-10-CM | POA: Diagnosis not present

## 2014-04-08 DIAGNOSIS — H53129 Transient visual loss, unspecified eye: Secondary | ICD-10-CM

## 2014-04-08 DIAGNOSIS — G459 Transient cerebral ischemic attack, unspecified: Secondary | ICD-10-CM | POA: Insufficient documentation

## 2014-04-08 DIAGNOSIS — I6523 Occlusion and stenosis of bilateral carotid arteries: Secondary | ICD-10-CM | POA: Diagnosis not present

## 2014-04-08 DIAGNOSIS — I359 Nonrheumatic aortic valve disorder, unspecified: Secondary | ICD-10-CM | POA: Diagnosis not present

## 2014-04-08 NOTE — Progress Notes (Signed)
Carotid Duplex Exam Performed 

## 2014-04-09 ENCOUNTER — Telehealth: Payer: Self-pay | Admitting: Cardiology

## 2014-04-09 NOTE — Telephone Encounter (Signed)
Spoke with pt and reviewed carotid doppler results with him.  

## 2014-04-09 NOTE — Telephone Encounter (Signed)
New Msg        Pt states he is returning a call from today.    Please call back.

## 2014-04-16 ENCOUNTER — Ambulatory Visit (INDEPENDENT_AMBULATORY_CARE_PROVIDER_SITE_OTHER): Payer: Medicare Other | Admitting: Pharmacist

## 2014-04-16 DIAGNOSIS — Z5181 Encounter for therapeutic drug level monitoring: Secondary | ICD-10-CM

## 2014-04-16 DIAGNOSIS — Z7901 Long term (current) use of anticoagulants: Secondary | ICD-10-CM

## 2014-04-16 DIAGNOSIS — Z954 Presence of other heart-valve replacement: Secondary | ICD-10-CM

## 2014-04-16 DIAGNOSIS — Z952 Presence of prosthetic heart valve: Secondary | ICD-10-CM

## 2014-04-16 LAB — POCT INR: INR: 3.4

## 2014-05-20 DIAGNOSIS — H8102 Meniere's disease, left ear: Secondary | ICD-10-CM | POA: Diagnosis not present

## 2014-05-22 ENCOUNTER — Encounter: Payer: Self-pay | Admitting: Family Medicine

## 2014-05-22 ENCOUNTER — Ambulatory Visit (INDEPENDENT_AMBULATORY_CARE_PROVIDER_SITE_OTHER): Payer: Medicare Other | Admitting: Family Medicine

## 2014-05-22 VITALS — BP 133/66 | HR 61 | Temp 98.2°F | Ht 70.0 in | Wt 147.0 lb

## 2014-05-22 DIAGNOSIS — E785 Hyperlipidemia, unspecified: Secondary | ICD-10-CM | POA: Diagnosis not present

## 2014-05-22 DIAGNOSIS — Z0000 Encounter for general adult medical examination without abnormal findings: Secondary | ICD-10-CM

## 2014-05-22 DIAGNOSIS — I10 Essential (primary) hypertension: Secondary | ICD-10-CM

## 2014-05-22 LAB — LIPID PANEL
CHOL/HDL RATIO: 3
Cholesterol: 139 mg/dL (ref 0–200)
HDL: 50.5 mg/dL (ref >39.00)
LDL CALC: 76 mg/dL (ref 0–99)
NONHDL: 88.5
Triglycerides: 61 mg/dL (ref 0.0–149.0)
VLDL: 12.2 mg/dL (ref 0.0–40.0)

## 2014-05-22 LAB — CBC WITH DIFFERENTIAL/PLATELET
BASOS PCT: 0.8 % (ref 0.0–3.0)
Basophils Absolute: 0 10*3/uL (ref 0.0–0.1)
EOS PCT: 3.1 % (ref 0.0–5.0)
Eosinophils Absolute: 0.1 10*3/uL (ref 0.0–0.7)
HCT: 41.5 % (ref 39.0–52.0)
Hemoglobin: 14.6 g/dL (ref 13.0–17.0)
LYMPHS PCT: 22.8 % (ref 12.0–46.0)
Lymphs Abs: 0.9 10*3/uL (ref 0.7–4.0)
MCHC: 35.2 g/dL (ref 30.0–36.0)
MCV: 86.6 fl (ref 78.0–100.0)
MONOS PCT: 11.9 % (ref 3.0–12.0)
Monocytes Absolute: 0.5 10*3/uL (ref 0.1–1.0)
Neutro Abs: 2.4 10*3/uL (ref 1.4–7.7)
Neutrophils Relative %: 61.4 % (ref 43.0–77.0)
Platelets: 218 10*3/uL (ref 150.0–400.0)
RBC: 4.8 Mil/uL (ref 4.22–5.81)
RDW: 14.3 % (ref 11.5–15.5)
WBC: 3.9 10*3/uL — ABNORMAL LOW (ref 4.0–10.5)

## 2014-05-22 LAB — POCT URINALYSIS DIPSTICK
GLUCOSE UA: NEGATIVE
Leukocytes, UA: NEGATIVE
Nitrite, UA: NEGATIVE
PH UA: 5.5
SPEC GRAV UA: 1.025
Urobilinogen, UA: 0.2

## 2014-05-22 LAB — BASIC METABOLIC PANEL
BUN: 21 mg/dL (ref 6–23)
CALCIUM: 10 mg/dL (ref 8.4–10.5)
CO2: 31 mEq/L (ref 19–32)
Chloride: 104 mEq/L (ref 96–112)
Creatinine, Ser: 1.14 mg/dL (ref 0.40–1.50)
GFR: 65.24 mL/min (ref >60.00)
Glucose, Bld: 104 mg/dL — ABNORMAL HIGH (ref 70–99)
Potassium: 5.3 mEq/L — ABNORMAL HIGH (ref 3.5–5.1)
Sodium: 138 mEq/L (ref 135–145)

## 2014-05-22 LAB — HEPATIC FUNCTION PANEL
ALK PHOS: 80 U/L (ref 39–117)
ALT: 20 U/L (ref 0–53)
AST: 20 U/L (ref 0–37)
Albumin: 4.2 g/dL (ref 3.5–5.2)
BILIRUBIN DIRECT: 0.4 mg/dL — AB (ref 0.0–0.3)
BILIRUBIN TOTAL: 2 mg/dL — AB (ref 0.2–1.2)
Total Protein: 7.3 g/dL (ref 6.0–8.3)

## 2014-05-22 LAB — TSH: TSH: 3.76 u[IU]/mL (ref 0.35–4.50)

## 2014-05-22 NOTE — Progress Notes (Signed)
Pre visit review using our clinic review tool, if applicable. No additional management support is needed unless otherwise documented below in the visit note. 

## 2014-05-22 NOTE — Progress Notes (Signed)
   Subjective:    Patient ID: David Benton, male    DOB: Apr 01, 1929, 81 y.o.   MRN: [id number]  HPI 81 yr old male for a cpx. He feels well in general.    Review of Systems  Constitutional: Negative.   HENT: Negative.   Eyes: Negative.   Respiratory: Negative.   Cardiovascular: Negative.   Gastrointestinal: Negative.   Genitourinary: Negative.   Musculoskeletal: Negative.   Skin: Negative.   Neurological: Negative.   Psychiatric/Behavioral: Negative.        Objective:   Physical Exam  Constitutional: He is oriented to person, place, and time. He appears well-developed and well-nourished. No distress.  HENT:  Head: Normocephalic and atraumatic.  Right Ear: External ear normal.  Left Ear: External ear normal.  Nose: Nose normal.  Mouth/Throat: Oropharynx is clear and moist. No oropharyngeal exudate.  Eyes: Conjunctivae and EOM are normal. Pupils are equal, round, and reactive to light. Right eye exhibits no discharge. Left eye exhibits no discharge. No scleral icterus.  Neck: Neck supple. No JVD present. No tracheal deviation present. No thyromegaly present.  Cardiovascular: Normal rate, regular rhythm, normal heart sounds and intact distal pulses.  Exam reveals no gallop and no friction rub.   No murmur heard. Pulmonary/Chest: Effort normal and breath sounds normal. No respiratory distress. He has no wheezes. He has no rales. He exhibits no tenderness.  Abdominal: Soft. Bowel sounds are normal. He exhibits no distension and no mass. There is no tenderness. There is no rebound and no guarding.  Genitourinary: Penis normal.  Musculoskeletal: Normal range of motion. He exhibits no edema or tenderness.  Lymphadenopathy:    He has no cervical adenopathy.  Neurological: He is alert and oriented to person, place, and time. He has normal reflexes. No cranial nerve deficit. He exhibits normal muscle tone. Coordination normal.  Skin: Skin is warm and dry. No rash noted. He is not  diaphoretic. No erythema. No pallor.  Psychiatric: He has a normal mood and affect. His behavior is normal. Judgment and thought content normal.          Assessment & Plan:  Well exam. We will get fasting labs today. We went over recommendations for diet and exercise. He is up to date on immunizations. He no longer needs a colonoscopy.

## 2014-05-28 ENCOUNTER — Ambulatory Visit (INDEPENDENT_AMBULATORY_CARE_PROVIDER_SITE_OTHER): Payer: Medicare Other | Admitting: *Deleted

## 2014-05-28 DIAGNOSIS — Z954 Presence of other heart-valve replacement: Secondary | ICD-10-CM

## 2014-05-28 DIAGNOSIS — Z5181 Encounter for therapeutic drug level monitoring: Secondary | ICD-10-CM | POA: Diagnosis not present

## 2014-05-28 DIAGNOSIS — Z952 Presence of prosthetic heart valve: Secondary | ICD-10-CM

## 2014-05-28 DIAGNOSIS — Z7901 Long term (current) use of anticoagulants: Secondary | ICD-10-CM

## 2014-05-28 LAB — POCT INR: INR: 2.6

## 2014-06-01 ENCOUNTER — Ambulatory Visit (INDEPENDENT_AMBULATORY_CARE_PROVIDER_SITE_OTHER): Payer: Medicare Other | Admitting: Family Medicine

## 2014-06-01 VITALS — BP 108/54 | HR 67 | Temp 98.7°F | Resp 16 | Ht 70.5 in | Wt 149.0 lb

## 2014-06-01 DIAGNOSIS — M79661 Pain in right lower leg: Secondary | ICD-10-CM

## 2014-06-01 DIAGNOSIS — S86811A Strain of other muscle(s) and tendon(s) at lower leg level, right leg, initial encounter: Secondary | ICD-10-CM

## 2014-06-01 LAB — D-DIMER, QUANTITATIVE (NOT AT ARMC): D DIMER QUANT: 0.28 ug{FEU}/mL (ref 0.00–0.48)

## 2014-06-01 NOTE — Progress Notes (Signed)
Subjective:    Patient ID: David Benton, male    DOB: 19-Jun-1929, 81 y.o.   MRN: [id number]  HPI Patient presents for calf pain that has been present for past 3 days. Was out golfing and bent down and felt a sharp 9/10 pain. Continued game despite pain. Pain has gotten 50% better and was able to walk through trail yesterday. Today has 2/10 pain that does not radiate. Denies swelling, redness, or warmth of calf. States that calf may have been swollen first day, but can't confirm. Walking makes only a little worse, but is tolerable. Denies weakness, numbness, loss of fxn/ROM/sensation of calf or lower extremity. Denies h/o DVT or PE, however, is taking coumadin for mechanical heart valve. Last INR taken 4 days ago was 2.6 and has been consistently in range. Denies use of any new medications, changes in current med dose, herbal supplements, or antibiotics.   Review of Systems  Constitutional: Negative.  Negative for fever and fatigue.  Respiratory: Negative for cough, shortness of breath and wheezing.   Cardiovascular: Negative.  Negative for chest pain, palpitations and leg swelling.  Musculoskeletal: Positive for myalgias. Negative for joint swelling, arthralgias and gait problem.  Skin: Negative.  Negative for color change, pallor and wound.  Neurological: Negative.  Negative for weakness and numbness.  Hematological: Does not bruise/bleed easily.       Objective:   Physical Exam  Constitutional: He is oriented to person, place, and time. He appears well-developed and well-nourished. No distress.  Blood pressure 108/54, pulse 67, temperature 98.7 F (37.1 C), temperature source Oral, resp. rate 16, height 5' 10.5" (1.791 m), weight 149 lb (67.586 kg), SpO2 98 %.  HENT:  Head: Normocephalic and atraumatic.  Right Ear: External ear normal.  Left Ear: External ear normal.  Eyes: Conjunctivae are normal. Right eye exhibits no discharge. Left eye exhibits no discharge.  Neck: Normal range  of motion. Neck supple. No thyromegaly present.  Cardiovascular: Normal rate, regular rhythm, normal heart sounds and intact distal pulses.  Exam reveals no gallop and no friction rub.   No murmur heard. Pulmonary/Chest: Effort normal and breath sounds normal. No respiratory distress. He has no wheezes. He has no rales.  Abdominal: Soft. Bowel sounds are normal. He exhibits no distension. There is no tenderness. There is no rebound and no guarding.  Musculoskeletal: Normal range of motion. He exhibits tenderness (medial aspect of right calf). He exhibits no edema.       Left knee: Normal.       Right upper leg: Normal.       Right lower leg: He exhibits tenderness. He exhibits no bony tenderness, no swelling, no edema, no deformity and no laceration.  Negative holman's. Both legs measure 12 1/4 circumference. No cord-like palpated.   Lymphadenopathy:    He has no cervical adenopathy.  Neurological: He is alert and oriented to person, place, and time. He has normal reflexes. No cranial nerve deficit. He exhibits normal muscle tone. Coordination normal.  Skin: Skin is warm and dry. No rash noted. He is not diaphoretic. No erythema. No pallor.   Results for orders placed or performed in visit on 06/01/14  D-dimer, quantitative  Result Value Ref Range   D-Dimer, Quant 0.28 0.00 - 0.48 ug/mL-FEU      Assessment & Plan:  1. Calf pain, right If d-dimer elevated will send for [place].  - D-dimer, quantitative  2. Strain of calf muscle, right, initial encounter Tylenol for pain. Ice 3-4x  daily.   Alveta Heimlich PA-C  [facility] 06/01/2014 12:56 PM

## 2014-06-04 NOTE — Progress Notes (Signed)
Patient ID: David Benton, male   DOB: [birth year], 81 y.o.   MRN: [id number] Reviewed documentation and agree w/ assessment and plan. Delman Cheadle, MD MPH

## 2014-06-11 ENCOUNTER — Other Ambulatory Visit: Payer: Self-pay | Admitting: Cardiology

## 2014-07-09 ENCOUNTER — Ambulatory Visit (INDEPENDENT_AMBULATORY_CARE_PROVIDER_SITE_OTHER): Payer: Medicare Other | Admitting: *Deleted

## 2014-07-09 DIAGNOSIS — Z7901 Long term (current) use of anticoagulants: Secondary | ICD-10-CM | POA: Diagnosis not present

## 2014-07-09 DIAGNOSIS — Z5181 Encounter for therapeutic drug level monitoring: Secondary | ICD-10-CM

## 2014-07-09 DIAGNOSIS — Z954 Presence of other heart-valve replacement: Secondary | ICD-10-CM | POA: Diagnosis not present

## 2014-07-09 DIAGNOSIS — Z952 Presence of prosthetic heart valve: Secondary | ICD-10-CM

## 2014-07-09 LAB — POCT INR: INR: 3.3

## 2014-07-10 ENCOUNTER — Encounter: Payer: Self-pay | Admitting: *Deleted

## 2014-07-15 ENCOUNTER — Other Ambulatory Visit: Payer: Self-pay | Admitting: *Deleted

## 2014-07-15 DIAGNOSIS — I712 Thoracic aortic aneurysm, without rupture, unspecified: Secondary | ICD-10-CM

## 2014-07-30 DIAGNOSIS — I712 Thoracic aortic aneurysm, without rupture: Secondary | ICD-10-CM | POA: Diagnosis not present

## 2014-07-30 LAB — CREATININE, SERUM: Creat: 1.09 mg/dL (ref 0.50–1.35)

## 2014-07-30 LAB — BUN: BUN: 23 mg/dL (ref 6–23)

## 2014-07-31 ENCOUNTER — Ambulatory Visit
Admission: RE | Admit: 2014-07-31 | Discharge: 2014-07-31 | Disposition: A | Discharge status: Home / Self Care | Payer: Medicare Other | Source: Ambulatory Visit | Attending: Cardiothoracic Surgery | Admitting: Cardiothoracic Surgery

## 2014-07-31 ENCOUNTER — Ambulatory Visit (INDEPENDENT_AMBULATORY_CARE_PROVIDER_SITE_OTHER): Payer: Medicare Other | Admitting: Cardiothoracic Surgery

## 2014-07-31 ENCOUNTER — Encounter: Payer: Self-pay | Admitting: Cardiothoracic Surgery

## 2014-07-31 VITALS — BP 123/70 | HR 70 | Resp 16 | Ht 70.5 in | Wt 140.0 lb

## 2014-07-31 DIAGNOSIS — Z954 Presence of other heart-valve replacement: Secondary | ICD-10-CM

## 2014-07-31 DIAGNOSIS — I712 Thoracic aortic aneurysm, without rupture, unspecified: Secondary | ICD-10-CM

## 2014-07-31 DIAGNOSIS — Z952 Presence of prosthetic heart valve: Secondary | ICD-10-CM

## 2014-07-31 MED ORDER — IOPAMIDOL (ISOVUE-370) INJECTION 76%
75.0000 mL | Freq: Once | INTRAVENOUS | Status: AC | PRN
  Administered 2014-07-31: 75 mL via INTRAVENOUS

## 2014-07-31 NOTE — Progress Notes (Signed)
[address]       [place] [address]             [phone]               Trevell W Kuhnert [facility] Medical Record #[id number] Date of Birth: July 01, 1929  Referring:  Dr Einar Crow Primary Care: [name], MD  Chief Complaint:    Chief Complaint  Patient presents with  . TAA    1 yr f/u with CTA CHEST    History of Present Illness:    Patient returns to the office today in followup after aortic valve replacement with a 29 mm St. Jude mechanical valve (29A-101  SN [id number]) in August of 1991 by Dr Redmond Pulling for aortic stenosis and bicuspid aortic valve. He was seen in April of 2009 because of aortic dilation on echocardiogram.  Since that time he's had serial CT scans to evaluate the size of his aorta . At age 81 he remains active exercising daily and continues to work as a Psychologist, occupational at [employer] . His cardiologist comes in with a not taking care of this He continues on Coumadin without evidence of significant bleeding complication .  He returns today with a followup CT scan of the chest one year followup  Current Activity/ Functional Status: Patient is  independent with mobility/ambulation, transfers, ADL's, IADL's.   Past Medical History  Diagnosis Date  . Shingles   . Thoracic aneurysm without mention of rupture     followed by [name] with yearly CT scans   . Heart valve replaced by other means   . Aortic valve disorders   . Unspecified essential hypertension   . Other and unspecified hyperlipidemia   . Personal history of colonic polyps   . External hemorrhoids without mention of complication   . Other constipation   . Personal history of malignant neoplasm of prostate   . Personal history of other disorders of nervous system and sense organs   . Meniere's disease   . AVD (aortic valve disease) 02/25/2010    sees [name]   . Long term (current) use of anticoagulants 09/15/2010  . SHINGLES 11/26/2009    Past Surgical History   Procedure Laterality Date  . Aortic valve replacement  1991     st. jude's valve, per [name]   . Hemorrhoid surgery    . Tonsillectomy    . Colonoscopy  06-28-10    per Dr. Henrene Pastor, benign polyps, no further scopes needed   . Prostatectomy  1995    per Dr. Roni Bread     Family History  Problem Relation Age of Onset  . Heart attack Father     mother  . Colon cancer Neg Hx   . Prostate cancer Brother   . Diabetes Brother   . Stroke Brother   . Heart disease Brother     carotid artery disease  . Hypertension Brother   . Cancer Sister     breast cancer; mastectomy at 46    History   Social History  . Marital Status: Married    Spouse Name: N/A  . Number of Children: 0  . Years of Education: 13   Occupational History  . retired   .     Social History Main Topics  . Smoking status: Former Smoker    Types: Cigarettes    Quit date: 02/06/1972  . Smokeless tobacco: Never Used  . Alcohol Use: 4.2  oz/week    7 Standard drinks or equivalent per week     Comment: glass of wine daily  . Drug Use: No  . Sexual Activity: No   Other Topics Concern  . Not on file   Social History Narrative   HSG, a couple of years of college. Married '78 - . No children. Work - [employer] 20 years - retired.   Lives alone with wife and two dogs. ACP - Yes CPR, Yes - short-term mechanical ventilation. Does not want prolonged heroic measures in the face of irreversible disease. HCPOA - wife. Alternative is his brother: Te[facility].     History  Smoking status  . Former Smoker  . Types: Cigarettes  . Quit date: 02/06/1972  Smokeless tobacco  . Never Used    History  Alcohol Use  . 4.2 oz/week  . 7 Standard drinks or equivalent per week    Comment: glass of wine daily     Allergies  Allergen Reactions  . Codeine     REACTION: N/V    Current Outpatient Prescriptions  Medication Sig Dispense Refill  . acetaminophen (TYLENOL) 325 MG tablet Take 650 mg by mouth  as needed.     Marland Kitchen amoxicillin (AMOXIL) 500 MG capsule Take as directed by dentist prior to procedure order states 4 po every 1 hour prior to dental appointment 12 capsule 1  . atorvastatin (LIPITOR) 40 MG tablet TAKE ONE TABLET BY MOUTH EVERY DAY 30 tablet 6  . magnesium hydroxide (MILK OF MAGNESIA) 400 MG/5ML suspension Take 30 mLs by mouth daily as needed for indigestion.     . Saline GEL Place into the nose daily as needed (as directed).    . sodium chloride (OCEAN) 0.65 % SOLN nasal spray Place 1 spray into both nostrils as needed for congestion.    . TRIAMTERENE-HCTZ PO Take by mouth. 37.5-25 mg take 1 daily.     Marland Kitchen warfarin (COUMADIN) 5 MG tablet Take 1 tablet (5 mg total) by mouth as directed. 45 tablet 3   No current facility-administered medications for this visit.       Review of Systems:     Cardiac Review of Systems: Y or N  Chest Pain [  n ]  Resting SOB [n   ] Exertional SOB  [  n]  Orthopnea Florencio.Farrier  ]   Pedal Edema [ n  ]    Palpitations [n  ] Syncope  Florencio.Farrier  ]   Presyncope [ n  ]  General Review of Systems: [Y] = yes [  ]=no Constitional: recent weight change [  ]; anorexia [  ]; fatigue [  ]; nausea [  ]; night sweats [  ]; fever [  ]; or chills [  ];  Dental: poor dentition[ N ]; Last Dentist visit:FALL 2013   Eye : blurred vision [  ]; diplopia [   ]; vision changes [  ];  Amaurosis fugax[  ]; Resp: cough [  ];  wheezing[  ];  hemoptysis[  ]; shortness of breath[  ]; paroxysmal nocturnal dyspnea[  ]; dyspnea on exertion[  ]; or orthopnea[  ];  GI:  gallstones[  ], vomiting[  ];  dysphagia[  ]; melena[  ];  hematochezia [  ]; heartburn[  ];   Hx of  Colonoscopy[ NOT NEEDED ANY MORE ]; GU: kidney stones [  ]; hematuria[  ];   dysuria [  ];  nocturia[  ];  history of     obstruction [  ];             Skin: rash, swelling[  ];, hair loss[  ];   peripheral edema[  ];  or itching[  ]; Musculosketetal: myalgias[  ];  joint swelling[  ];  joint erythema[  ];  joint pain[  ];  back pain[  ];  Heme/Lymph: bruising[  ];  bleeding[  ];  anemia[  ];  Neuro: TIA[  ];  headaches[  ];  stroke[  ];  vertigo[  ];  seizures[  ];   paresthesias[  ];  difficulty walking[  ];  Psych:depression[  ]; anxiety[  ];  Endocrine: diabetes[  ];  thyroid dysfunction[  ];  Immunizations: Flu [ y  ]; Pneumococcal[ y IN [place]  ];  Other:  Physical Exam: BP 123/70 mmHg  Pulse 70  Resp 16  Ht 5' 10.5" (1.791 m)  Wt 140 lb (63.504 kg)  BMI 19.80 kg/m2  SpO2 98%  General appearance: alert and cooperative Neurologic: intact Heart: regular rate and rhythm, S1, S2 normal, no murmur, crisp valve sound click no murmur of aortic insufficiency, rub or gallop and normal apical impulse Lungs: clear to auscultation bilaterally and normal percussion bilaterally Abdomen: soft, non-tender; bowel sounds normal; no masses,  no organomegaly Extremities: extremities normal, atraumatic, no cyanosis or edema Wound: Sternum is stable   Diagnostic Studies & Laboratory data:     Recent Radiology Findings: Ct Angio Chest Aorta W/cm &/or Wo/cm  07/31/2014   CLINICAL DATA:  Thoracic aortic aneurysm.  EXAM: CT ANGIOGRAPHY CHEST WITH CONTRAST  TECHNIQUE: Multidetector CT imaging of the chest was performed using the standard protocol during bolus administration of intravenous contrast. Multiplanar CT image reconstructions and MIPs were obtained to evaluate the vascular anatomy.  CONTRAST:  75 mL of Isovue 370 intravenously.  COMPARISON:  CT exam of August 01, 2013.  FINDINGS: No pneumothorax or pleural effusion is noted. No acute pulmonary disease is noted. There is no evidence of thoracic aortic dissection. Status post aortic valve replacement. Coronary artery calcifications are noted. Aortic root measures 4.1 cm. Ascending thoracic aorta measures 4.4 mm which is not significantly  changed compared to prior exam. Distal aortic arch measures 3.1 cm. Descending thoracic aorta measures 3.1 cm in diameter. Great vessels are widely patent without significant stenosis. No definite mediastinal mass or adenopathy is noted. Visualized portion of upper abdomen is unremarkable.  Review of the MIP images confirms the above findings.  IMPRESSION: Coronary artery calcifications are noted suggesting coronary artery disease.  Status post surgical aortic valve replacement.  Ascending thoracic aortic aneurysm is noted with maximum measured diameter 4.4 cm which is not significantly changed compared to prior exam. Recommend annual imaging followup by CTA or MRA. This recommendation  follows 2010 ACCF/AHA/AATS/ACR/ASA/SCA/SCAI/SIR/STS/SVM Guidelines for the Diagnosis and Management of Patients with Thoracic Aortic Disease. Circulation. 2010; 121: K863-O177   Electronically Signed   By: Marijo Conception, M.D.   On: 07/31/2014 15:39   Ct Angio Chest Aorta W/cm &/or Wo/cm  08/01/2013   CLINICAL DATA:  Thoracic aortic aneurysmal disease with history of aortic valve replacement.  EXAM: CT ANGIOGRAPHY CHEST WITH CONTRAST  TECHNIQUE: Multidetector CT imaging of the chest was performed using the standard protocol during bolus administration of intravenous contrast. Multiplanar CT image reconstructions and MIPs were obtained to evaluate the vascular anatomy.  CONTRAST:  37mL OMNIPAQUE IOHEXOL 350 MG/ML SOLN  COMPARISON:  02/16/2012 and other prior CTA studies.  FINDINGS: Measured maximal diameter of the ascending thoracic aorta is 4.3 cm. The 4.6 cm measurement previously may have been slightly overestimated. There is no evidence of aortic dissection or intramural hemorrhage. The proximal arch measures 3.3 cm. The distal arch measures 3.2 cm. The descending thoracic aorta measures 3.2 cm. Stable mild atherosclerotic plaque involving the arch and proximal great vessels without evidence of ulcerated plaque or penetrating  ulcer. Proximal great vessels showed no significant stenosis.  The heart size appears stable. There is no evidence of pleural or pericardial fluid. Lungs show no evidence of edema, consolidation or nodule. No enlarged lymph nodes are identified. Stable calcified plaque in the distribution of the LAD and left circumflex coronary arteries. Visualized upper abdomen is unremarkable. Bony structures are unremarkable.  Review of the MIP images confirms the above findings.  IMPRESSION: No further enlargement of the ascending thoracic aorta. Current maximal diameter measurement is 4.3 cm by CTA and appears to be stable compared to the 06/2010 study. Previous measurement of 4.6 cm may have been slightly overestimated based on axis of measurement.   Electronically Signed   By: Aletta Edouard M.D.   On: 08/01/2013 13:36    Ct Angio Chest Aorta W/cm &/or Wo/cm  08/14/2012   *RADIOLOGY REPORT*  Clinical Data:  54-month follow-up thoracic aortic aneurysm and liver lesion.  CT ANGIOGRAPHY CHEST  Technique:  Multidetector CT imaging of the chest using the standard protocol during bolus administration of intravenous contrast. Multiplanar reconstructed images including MIPs were obtained and reviewed to evaluate the vascular anatomy.  Contrast: 51mL OMNIPAQUE IOHEXOL 350 MG/ML SOLN  Comparison: Prior CTA of the chest 02/16/2012  Findings:  Mediastinum: Unremarkable CT appearance of the thyroid gland.  No suspicious mediastinal or hilar adenopathy.  No soft tissue mediastinal mass.  The thoracic esophagus is unremarkable.  Heart/Vascular: Surgical changes of mechanical aortic valve repair. There is persistent dilatation of the aortic root up to 4.3 cm at the sinuses of Valsalva.  This is not significantly changed compared to prior.  The aorta measures approximately 4.2 cm at the sinotubular junction.  The ascending aorta is aneurysmally dilated with a maximal diameter of 4.6 cm, unchanged compared to prior. The transverse aorta  measures 3.4 cm.  The proximal descending thoracic aorta measures 3.6 cm.  Scattered atherosclerotic vascular calcifications.  Extensive atherosclerotic calcifications noted throughout the coronary arteries. Mild ectasia of the right subclavian artery to 1.6 cm. Stable borderline cardiomegaly.  Lungs/Pleura: Trace biapical pleural parenchymal scarring.  Trace dependent atelectasis.  The lungs are otherwise clear.  Upper Abdomen: Stable 6 - 7 mm hypoattenuating lesion in the posterior aspect of hepatic segments six.  No significant interval growth.  Sludge and/or small stones layer within the gallbladder lumen.  Bilateral renal cortical thinning with scattered areas of scarring.  Otherwise, unremarkable upper abdomen.  Bones: No acute fracture or aggressive appearing lytic or blastic osseous lesion.  T12 hemangioma.  Healed median sternotomy.  IMPRESSION:  1.  Stable ascending aortic aneurysm with maximal diameter of the ascending tubular segment at 4.6 cm and  maximal diameter of the sinotubular junction 4.3 cm.  2.  Stable sub centimeter hypoattenuating lesion in the posterior right hepatic lobe.  In retrospect, this finding was likely present in 2012 (although it cannot be definitively identified on studies from 2010 or 2009) and is very likely benign. Recommend continued attention on routine follow-up imaging.  3.  Sludge and / or small stones in the gallbladder lumen.  4.  Atherosclerosis including multivessel coronary artery disease.  Signed,  Criselda Peaches, MD Vascular & Interventional Radiologist [facility] Radiology   Original Report Authenticated By: Jacqulynn Cadet, M.D.     Ct Angio Chest W/cm &/or Wo Cm  02/16/2012  *RADIOLOGY REPORT*  Clinical Data: Thoracic aortic aneurysm  CT ANGIOGRAPHY CHEST  Technique:  Multidetector CT imaging of the chest using the standard protocol during bolus administration of intravenous contrast. Multiplanar reconstructed images including MIPs were obtained and  reviewed to evaluate the vascular anatomy.  Contrast: 162mL OMNIPAQUE IOHEXOL 350 MG/ML SOLN  Comparison: 06/24/2010  Findings: Aneurysmal dilatation of the ascending aorta has slightly increased.  Diameters at the sinus of Valsalva, sinotubular junction, and ascending aorta are 4.4 cm, 4.3 cm, and 4.6 cm. Previously, maximal diameter in the sinus of Valsalva was 4.3 cm while the ascending aorta was 4.2 cm.  Aortic valve replacement hardware remains stable in position.  Mitral valve calcifications are noted coronary artery calcifications in the left main, left anterior descending, and circumflex arteries are noted.  Mild atherosclerotic changes at the origin of the left common carotid artery and left subclavian artery are present without significant narrowing.  Innominate artery and right subclavian artery are ectatic measuring 1.8 and 1.8 cm in caliber respectively.  Subclavian arteries are patent.  Visualized common carotid arteries are patent.  Vertebral arteries are patent.  Left vertebral is diminutive.  No obvious filling defect in the pulmonary arterial tree to suggest acute pulmonary thromboembolism.  No evidence of aortic transection or dissection.  Limited images of the abdomen demonstrate mild narrowing at the origin of the celiac axis with the appearance of median arcuate ligament syndrome.  SMA is patent.  Branch vessels of the celiac axis are grossly patent.  Atherosclerotic changes at the origin of both renal arteries is present.  Degree of narrowing is difficult to determine secondary to calcifications.  No evidence of abnormal mediastinal, hilar, or axillary adenopathy. No pericardial effusion.  No pneumothorax.  No pleural effusion.  Diffuse centrilobular emphysema is not significantly changed.  No new mass or consolidation.  Stable parenchymal scarring at the lung apices.  Bilateral renal scarring.  Adrenal glands are within normal limits. New sub centimeter low density lesion in the right lobe of  the liver has developed on image 131.  No destructive bone lesion.  T12 angioma is noted.  IMPRESSION: Aneurysmal dilatation has slightly increased.  Maximal diameter of the ascending aorta is 4.6 cm and was previously 4.2 cm.  Maximal diameter at the sinus of Valsalva is 4.4 cm and was previously 4.3 cm.  New sub centimeter liver lesion in the right lobe has developed. It is benign appearing but because it is new, follow-up CT or MRI of the liver in 6 months is recommended to ensure stability.  It may  have been obscured on prior studies secondary to phase of liver enhancement.   Original Report Authenticated By: Marybelle Killings, M.D.       Recent Lab Findings: Lab Results  Component Value Date   WBC 3.9* 05/22/2014   HGB 14.6 05/22/2014   HCT 41.5 05/22/2014   PLT 218.0 05/22/2014   GLUCOSE 104* 05/22/2014   CHOL 139 05/22/2014   TRIG 61.0 05/22/2014   HDL 50.50 05/22/2014   LDLCALC 76 05/22/2014   ALT 20 05/22/2014   AST 20 05/22/2014   NA 138 05/22/2014   K 5.3* 05/22/2014   CL 104 05/22/2014   CREATININE 1.09 07/30/2014   BUN 23 07/30/2014   CO2 31 05/22/2014   TSH 3.76 05/22/2014   INR 3.3 07/09/2014   Aortic Size Index=     4.6    /Body surface area is 1.78 meters squared. =  2.51  < 2.75 cm/m2      4% risk per year 2.75 to 4.25          8% risk per year > 4.25 cm/m2    20% risk per year  cross sectional area of aorta cm2/height in meters  16.6/1.77= 9.3   Assessment / Plan:    1 status post replacement of aortic valve with 29 mm mechanical St. Jude .  Aneurysmal dilatation of the ascending thoracic aorta which measures up to 4.4 cm in diameter on today's examination.    On review of the CT scan of the chest the ascending aorta appears basically stable still well under 5 cm in size .  I plan to see the patient back in one year with a followup CTA of the chest to follow his dilated ascending aorta He continues to be diligent about his dental care and is aware of  endocarditis precautions    Grace Isaac MD  Beeper [phone] Office [phone] 07/31/2014 4:22 PM

## 2014-08-18 ENCOUNTER — Other Ambulatory Visit: Payer: Self-pay | Admitting: Cardiology

## 2014-08-20 ENCOUNTER — Ambulatory Visit (INDEPENDENT_AMBULATORY_CARE_PROVIDER_SITE_OTHER): Payer: Medicare Other | Admitting: *Deleted

## 2014-08-20 DIAGNOSIS — Z952 Presence of prosthetic heart valve: Secondary | ICD-10-CM

## 2014-08-20 DIAGNOSIS — Z954 Presence of other heart-valve replacement: Secondary | ICD-10-CM

## 2014-08-20 DIAGNOSIS — Z5181 Encounter for therapeutic drug level monitoring: Secondary | ICD-10-CM

## 2014-08-20 DIAGNOSIS — Z7901 Long term (current) use of anticoagulants: Secondary | ICD-10-CM

## 2014-08-20 LAB — POCT INR: INR: 2.8

## 2014-09-17 ENCOUNTER — Telehealth: Payer: Self-pay | Admitting: Cardiology

## 2014-09-17 NOTE — Telephone Encounter (Signed)
Pt had questions about echo scheduled 09/23/14, questions answered.

## 2014-09-17 NOTE — Telephone Encounter (Signed)
New message      Talk to the nurse about his upcoming CT scan

## 2014-09-23 ENCOUNTER — Other Ambulatory Visit (HOSPITAL_COMMUNITY): Payer: Medicare Other

## 2014-09-23 ENCOUNTER — Other Ambulatory Visit: Payer: Self-pay | Admitting: Cardiology

## 2014-09-23 ENCOUNTER — Other Ambulatory Visit: Payer: Self-pay

## 2014-09-23 ENCOUNTER — Ambulatory Visit (HOSPITAL_COMMUNITY): Payer: Medicare Other | Attending: Cardiology

## 2014-09-23 DIAGNOSIS — G459 Transient cerebral ischemic attack, unspecified: Secondary | ICD-10-CM

## 2014-09-23 DIAGNOSIS — Z87891 Personal history of nicotine dependence: Secondary | ICD-10-CM | POA: Diagnosis not present

## 2014-09-23 DIAGNOSIS — E785 Hyperlipidemia, unspecified: Secondary | ICD-10-CM | POA: Insufficient documentation

## 2014-09-23 DIAGNOSIS — I071 Rheumatic tricuspid insufficiency: Secondary | ICD-10-CM | POA: Insufficient documentation

## 2014-09-23 DIAGNOSIS — I340 Nonrheumatic mitral (valve) insufficiency: Secondary | ICD-10-CM | POA: Insufficient documentation

## 2014-09-23 DIAGNOSIS — I517 Cardiomegaly: Secondary | ICD-10-CM | POA: Insufficient documentation

## 2014-09-23 DIAGNOSIS — I272 Other secondary pulmonary hypertension: Secondary | ICD-10-CM | POA: Diagnosis not present

## 2014-09-23 DIAGNOSIS — I351 Nonrheumatic aortic (valve) insufficiency: Secondary | ICD-10-CM | POA: Insufficient documentation

## 2014-09-23 DIAGNOSIS — I10 Essential (primary) hypertension: Secondary | ICD-10-CM | POA: Insufficient documentation

## 2014-09-23 DIAGNOSIS — I77819 Aortic ectasia, unspecified site: Secondary | ICD-10-CM | POA: Diagnosis not present

## 2014-09-23 DIAGNOSIS — I359 Nonrheumatic aortic valve disorder, unspecified: Secondary | ICD-10-CM | POA: Diagnosis not present

## 2014-09-26 ENCOUNTER — Telehealth: Payer: Self-pay

## 2014-09-26 NOTE — Telephone Encounter (Signed)
-----   Message from Larey Dresser, MD sent at 09/25/2014  9:56 PM EDT ----- Normal EF, stable mechanical aortic valve, stable 4.5 cm ascending aorta.

## 2014-09-26 NOTE — Telephone Encounter (Signed)
SPOKE WITH PATIENT ABOUT ECHO RESULTS. PT VERBALIZED UNDERSTANDING AND FELT GOOD ABOUT THE RESULTS.

## 2014-09-30 DIAGNOSIS — H2513 Age-related nuclear cataract, bilateral: Secondary | ICD-10-CM | POA: Diagnosis not present

## 2014-10-01 ENCOUNTER — Ambulatory Visit (INDEPENDENT_AMBULATORY_CARE_PROVIDER_SITE_OTHER): Payer: Medicare Other | Admitting: *Deleted

## 2014-10-01 DIAGNOSIS — Z7901 Long term (current) use of anticoagulants: Secondary | ICD-10-CM | POA: Diagnosis not present

## 2014-10-01 DIAGNOSIS — Z5181 Encounter for therapeutic drug level monitoring: Secondary | ICD-10-CM

## 2014-10-01 DIAGNOSIS — Z954 Presence of other heart-valve replacement: Secondary | ICD-10-CM | POA: Diagnosis not present

## 2014-10-01 DIAGNOSIS — Z952 Presence of prosthetic heart valve: Secondary | ICD-10-CM

## 2014-10-01 LAB — POCT INR: INR: 3.5

## 2014-10-10 ENCOUNTER — Ambulatory Visit: Payer: Medicare Other | Admitting: Nurse Practitioner

## 2014-10-17 ENCOUNTER — Encounter: Payer: Self-pay | Admitting: Nurse Practitioner

## 2014-10-17 ENCOUNTER — Ambulatory Visit (INDEPENDENT_AMBULATORY_CARE_PROVIDER_SITE_OTHER): Payer: Medicare Other | Admitting: Nurse Practitioner

## 2014-10-17 VITALS — BP 168/72 | HR 77 | Ht 71.0 in | Wt 148.1 lb

## 2014-10-17 DIAGNOSIS — I10 Essential (primary) hypertension: Secondary | ICD-10-CM | POA: Diagnosis not present

## 2014-10-17 DIAGNOSIS — I712 Thoracic aortic aneurysm, without rupture, unspecified: Secondary | ICD-10-CM

## 2014-10-17 DIAGNOSIS — Z954 Presence of other heart-valve replacement: Secondary | ICD-10-CM | POA: Diagnosis not present

## 2014-10-17 DIAGNOSIS — Z952 Presence of prosthetic heart valve: Secondary | ICD-10-CM

## 2014-10-17 DIAGNOSIS — E785 Hyperlipidemia, unspecified: Secondary | ICD-10-CM | POA: Diagnosis not present

## 2014-10-17 NOTE — Patient Instructions (Signed)
We will be checking the following labs today - NONE   Medication Instructions:    Continue with your current medicines.     Testing/Procedures To Be Arranged:  N/A  Follow-Up:   See [name] in 6 months    Other Special Instructions:   Monitor your BP at home - the Omron brand is typically the better one to get.   Call the [place] office at [phone] if you have any questions, problems or concerns.

## 2014-10-17 NOTE — Progress Notes (Signed)
CARDIOLOGY OFFICE NOTE  Date:  10/17/2014    David Benton Date of Birth: Mar 08, 1929 Medical Record #[id number]  PCP:  [name], MD  Cardiologist:  Yancey Flemings chief complaint on file.   History of Present Illness: David Benton is a 81 y.o. male who presents today for a 6 month check. Seen for [name]. He has a history of St Jude mechanical aortic valve (AS, bicuspid aortic valve) and thoracic aortic aneurysm.  He volunteers at [facility].He has not tolerated aspirin therapy in the past.    Last seen back in March - had had a transient episode with his vision - this has not recurred.   Recent echo noted.   Comes back today. Here alone. He is doing well. He is an avid walker. He participates in "[employer]". Has walked in all 31 states and in all 100 counties in [place]. Does several 10K with his wife each year. No chest pain. Breathing is good. Not dizzy or lightheaded. No problems with his coumadin. Labs from earlier this summer are reviewed. He is happy with how he is doing.   PMH: 1. Prostate cancer s/p prostectomy in 1995 2. Meniere's disease 3. Shingles 4. St Jude mechanical aortic valve #29 placed in 1991. Patient had AS with bicuspid aortic valve. Echo (9/08) with EF 60%, normal mechanical aortic valve. Echo (5/14) with EF 60%, mechanical aortic valve with normal function. Unable to tolerate ASA in addition to coumadin.  5. H/o CCY 6. Hyperlipidemia 7. Thoracic aortic aneurysm: Ascending aorta 4.5 cm by CTA in 1/14. Ascending aorta 4.6 cm by CTA in 7/14. Ascending aorta 4.3 cm by CTA in 7/15. Followed by [name].  8. Transient monocular blindness   Past Medical History  Diagnosis Date  . Shingles   . Thoracic aneurysm without mention of rupture     followed by [name] with yearly CT scans   . Heart valve replaced by other means   . Aortic valve disorders   . Unspecified essential hypertension   . Other and unspecified hyperlipidemia     . Personal history of colonic polyps   . External hemorrhoids without mention of complication   . Other constipation   . Personal history of malignant neoplasm of prostate   . Personal history of other disorders of nervous system and sense organs   . Meniere's disease   . AVD (aortic valve disease) 02/25/2010    sees [name]   . Long term (current) use of anticoagulants 09/15/2010  . SHINGLES 11/26/2009    Past Surgical History  Procedure Laterality Date  . Aortic valve replacement  1991     st. jude's valve, per [name]   . Hemorrhoid surgery    . Tonsillectomy    . Colonoscopy  06-28-10    per Dr. Henrene Pastor, benign polyps, no further scopes needed   . Prostatectomy  1995    per Dr. Roni Bread      Medications: Current Outpatient Prescriptions  Medication Sig Dispense Refill  . acetaminophen (TYLENOL) 325 MG tablet Take 650 mg by mouth as needed.     Marland Kitchen amoxicillin (AMOXIL) 500 MG capsule Take as directed by dentist prior to procedure order states 4 po every 1 hour prior to dental appointment 12 capsule 1  . atorvastatin (LIPITOR) 40 MG tablet TAKE ONE TABLET BY MOUTH EVERY DAY 30 tablet 3  . magnesium hydroxide (MILK OF MAGNESIA) 400 MG/5ML suspension Take 30 mLs  by mouth daily as needed for indigestion.     . Saline GEL Place into the nose daily as needed (as directed).    . sodium chloride (OCEAN) 0.65 % SOLN nasal spray Place 1 spray into both nostrils as needed for congestion.    . TRIAMTERENE-HCTZ PO Take by mouth. 37.5-25 mg take 1 daily.     Marland Kitchen warfarin (COUMADIN) 5 MG tablet Take as directed by Coumadin clinic 45 tablet 3   No current facility-administered medications for this visit.    Allergies: Allergies  Allergen Reactions  . Codeine     REACTION: N/V    Social History: The patient  reports that he quit smoking about 42 years ago. His smoking use included Cigarettes. He has never used smokeless tobacco. He reports that he drinks about 4.2 oz of alcohol  per week. He reports that he does not use illicit drugs.   Family History: The patient's family history includes Cancer in his sister; Diabetes in his brother; Heart attack in his father; Heart disease in his brother; Hypertension in his brother; Prostate cancer in his brother; Stroke in his brother. There is no history of Colon cancer.   Review of Systems: Please see the history of present illness.   Otherwise, the review of systems is positive for none.   All other systems are reviewed and negative.   Physical Exam: VS:  BP 168/72 mmHg  Pulse 77  Ht 5\' 11"  (1.803 m)  Wt 148 lb 1.9 oz (67.187 kg)  BMI 20.67 kg/m2  SpO2 97% .  BMI Body mass index is 20.67 kg/(m^2).  Wt Readings from Last 3 Encounters:  10/17/14 148 lb 1.9 oz (67.187 kg)  07/31/14 140 lb (63.504 kg)  06/01/14 149 lb (67.586 kg)   BP by me is 140/70  General: Very pleasant. Well developed, well nourished and in no acute distress.  HEENT: Normal. Neck: Supple, no JVD, carotid bruits, or masses noted.  Cardiac: Regular rate and rhythm. Crisp aortic valve. No edema.  Respiratory:  Lungs are clear to auscultation bilaterally with normal work of breathing.  GI: Soft and nontender.  MS: No deformity or atrophy. Gait and ROM intact. Skin: Warm and dry. Color is normal. His nails are abnormal on the right hand.  Neuro:  Strength and sensation are intact and no gross focal deficits noted.  Psych: Alert, appropriate and with normal affect.   LABORATORY DATA:  EKG:  EKG is not ordered today.  Lab Results  Component Value Date   WBC 3.9* 05/22/2014   HGB 14.6 05/22/2014   HCT 41.5 05/22/2014   PLT 218.0 05/22/2014   GLUCOSE 104* 05/22/2014   CHOL 139 05/22/2014   TRIG 61.0 05/22/2014   HDL 50.50 05/22/2014   LDLCALC 76 05/22/2014   ALT 20 05/22/2014   AST 20 05/22/2014   NA 138 05/22/2014   K 5.3* 05/22/2014   CL 104 05/22/2014   CREATININE 1.09 07/30/2014   BUN 23 07/30/2014   CO2 31 05/22/2014   TSH 3.76  05/22/2014   PSA 0.01* 11/20/2009   INR 3.5 10/01/2014    BNP (last 3 results) No results for input(s): BNP in the last 8760 hours.  ProBNP (last 3 results) No results for input(s): PROBNP in the last 8760 hours.   Other Studies Reviewed Today:  Echo Study Conclusions from 09/2014  - Left ventricle: The cavity size was normal. There was severe focal basal and mild concentric hypertrophy of the septum. Systolic function was vigorous.  The estimated ejection fraction was in the range of 65% to 70%. Wall motion was normal; there were no regional wall motion abnormalities. Doppler parameters are consistent with abnormal left ventricular relaxation (grade 1 diastolic dysfunction). Doppler parameters are consistent with elevated ventricular end-diastolic filling pressure. - Aortic valve: There was trivial regurgitation. - Ascending aorta: The ascending aorta was dilated measuring 45 mm. - Mitral valve: Calcified annulus. Mildly thickened leaflets . There was mild regurgitation. - Left atrium: The atrium was normal in size. - Right ventricle: Systolic function was normal. - Tricuspid valve: There was mild-moderate regurgitation. - Pulmonary arteries: Systolic pressure was mildly increased. PA peak pressure: 35 mm Hg (S).  Impressions:  - Normally funtioning mechanical aortic valve. Unchanged gradients from 2014. Dilated ascending aorta measuring 45 mm, unchanged from 2014. Mild pulmonary hypertension.  Assessment/Plan: 1. Mechanical aortic valve: Crisp valve sound. He is on coumadin with goal INR 2.5-3.5. ASA was stopped due to severe nosebleeds and a forearm hematoma. Valve looked ok by recent echo. He is doing well clinically.   2. Thoracic aortic aneurysm: Bicuspid aortic valve disorder with AS (now corrected) and thoracic aortic aneurysm.  Follows regularly with [name].    3. Hyperlipidemia: on statin therapy  4. Transient left eye  blindness: Lasted a few seconds. Concerning for amaurosis fugax. Also could be related to embolism from valve. He has been taking his coumadin as ordered. No recurrence noted.   5. HTN - recheck by me is better. I have asked him to monitor at home - goal to be less than 140/90. He is agreeable.   Current medicines are reviewed with the patient today.  The patient does not have concerns regarding medicines other than what has been noted above.  The following changes have been made:  See above.  Labs/ tests ordered today include:   No orders of the defined types were placed in this encounter.     Disposition:   FU with [name] in 6 months.   Patient is agreeable to this plan and will call if any problems develop in the interim.   Signed: Burtis Junes, RN, ANP-C 10/17/2014 3:29 PM  [facility] [address] [place], [place]  [address] Phone: [phone] Fax: [phone]

## 2014-10-21 ENCOUNTER — Other Ambulatory Visit: Payer: Self-pay | Admitting: Cardiology

## 2014-10-21 NOTE — Telephone Encounter (Signed)
Pharmacy requesting refill for antibiotic.  Please advise.

## 2014-10-21 NOTE — Telephone Encounter (Signed)
This is Ok to refill.

## 2014-10-23 ENCOUNTER — Other Ambulatory Visit: Payer: Self-pay

## 2014-10-23 MED ORDER — AMOXICILLIN 500 MG PO CAPS: ORAL_CAPSULE | ORAL | Status: DC

## 2014-11-12 ENCOUNTER — Ambulatory Visit (INDEPENDENT_AMBULATORY_CARE_PROVIDER_SITE_OTHER): Payer: Medicare Other | Admitting: *Deleted

## 2014-11-12 DIAGNOSIS — Z7901 Long term (current) use of anticoagulants: Secondary | ICD-10-CM

## 2014-11-12 DIAGNOSIS — Z954 Presence of other heart-valve replacement: Secondary | ICD-10-CM

## 2014-11-12 DIAGNOSIS — Z5181 Encounter for therapeutic drug level monitoring: Secondary | ICD-10-CM | POA: Diagnosis not present

## 2014-11-12 DIAGNOSIS — Z952 Presence of prosthetic heart valve: Secondary | ICD-10-CM

## 2014-11-12 LAB — POCT INR: INR: 3.3

## 2014-12-08 ENCOUNTER — Other Ambulatory Visit: Payer: Self-pay | Admitting: Cardiology

## 2014-12-12 ENCOUNTER — Encounter: Payer: Self-pay | Admitting: Internal Medicine

## 2014-12-24 ENCOUNTER — Telehealth: Payer: Self-pay | Admitting: Family Medicine

## 2014-12-24 ENCOUNTER — Ambulatory Visit (INDEPENDENT_AMBULATORY_CARE_PROVIDER_SITE_OTHER): Payer: Medicare Other | Admitting: *Deleted

## 2014-12-24 DIAGNOSIS — Z7901 Long term (current) use of anticoagulants: Secondary | ICD-10-CM

## 2014-12-24 DIAGNOSIS — Z954 Presence of other heart-valve replacement: Secondary | ICD-10-CM | POA: Diagnosis not present

## 2014-12-24 DIAGNOSIS — Z5181 Encounter for therapeutic drug level monitoring: Secondary | ICD-10-CM | POA: Diagnosis not present

## 2014-12-24 DIAGNOSIS — Z952 Presence of prosthetic heart valve: Secondary | ICD-10-CM

## 2014-12-24 LAB — POCT INR: INR: 2.8

## 2014-12-24 NOTE — Telephone Encounter (Signed)
David Benton said he continues receiving reminders in MyChart to get his flu shot but he had one at [facility] on 9.21.16. He'd like you to put in a request to have the reminders stop. Please call him if you have questions or concerns. Thank you.

## 2014-12-24 NOTE — Telephone Encounter (Signed)
I updated vaccine and left a message for pt.

## 2015-01-13 ENCOUNTER — Other Ambulatory Visit: Payer: Self-pay | Admitting: Cardiology

## 2015-02-04 ENCOUNTER — Ambulatory Visit (INDEPENDENT_AMBULATORY_CARE_PROVIDER_SITE_OTHER): Payer: Medicare Other | Admitting: *Deleted

## 2015-02-04 DIAGNOSIS — Z5181 Encounter for therapeutic drug level monitoring: Secondary | ICD-10-CM

## 2015-02-04 DIAGNOSIS — Z952 Presence of prosthetic heart valve: Secondary | ICD-10-CM

## 2015-02-04 DIAGNOSIS — Z7901 Long term (current) use of anticoagulants: Secondary | ICD-10-CM

## 2015-02-04 DIAGNOSIS — Z954 Presence of other heart-valve replacement: Secondary | ICD-10-CM | POA: Diagnosis not present

## 2015-02-04 LAB — POCT INR: INR: 2.6

## 2015-02-09 ENCOUNTER — Ambulatory Visit (INDEPENDENT_AMBULATORY_CARE_PROVIDER_SITE_OTHER): Payer: Medicare Other | Admitting: Family Medicine

## 2015-02-09 ENCOUNTER — Encounter: Payer: Self-pay | Admitting: Family Medicine

## 2015-02-09 VITALS — BP 154/74 | HR 67 | Temp 98.4°F | Ht 71.0 in | Wt 151.3 lb

## 2015-02-09 DIAGNOSIS — H8110 Benign paroxysmal vertigo, unspecified ear: Secondary | ICD-10-CM | POA: Diagnosis not present

## 2015-02-09 MED ORDER — MECLIZINE HCL 25 MG PO TABS: 25.0000 mg | ORAL_TABLET | ORAL | Status: DC | PRN

## 2015-02-09 NOTE — Progress Notes (Signed)
   Subjective:    Patient ID: David Benton, male    DOB: 24-Apr-1929, 82 y.o.   MRN: [id number]  HPI Here for intermittent dizziness which started in mid November after a plane flight home from [place], [place]. He feels dizzy as if the room is spinning or as if he may lose his balance. This comes and goes. No headache or blurred vision or any other neurologic deficits. This has been a little worse the past week. No chest pain or SOB. His last EKG a year ago showed sinus rhythm with some PVCs. He had a normal ECHO last year and one year ago he had a carotid doppler showing 40-60% stenoses bilaterally.    Review of Systems  Constitutional: Negative.   Respiratory: Negative.   Cardiovascular: Negative.   Neurological: Positive for dizziness. Negative for tremors, seizures, syncope, facial asymmetry, speech difficulty, weakness, light-headedness, numbness and headaches.       Objective:   Physical Exam  Constitutional: He is oriented to person, place, and time. He appears well-developed and well-nourished.  HENT:  Head: Normocephalic and atraumatic.  Right Ear: External ear normal.  Left Ear: External ear normal.  Nose: Nose normal.  Mouth/Throat: Oropharynx is clear and moist.  Eyes: Conjunctivae are normal. Pupils are equal, round, and reactive to light.  Neck: No thyromegaly present.  No carotid bruits   Cardiovascular: Normal rate, normal heart sounds and intact distal pulses.   Occasional ectopy   Pulmonary/Chest: Effort normal and breath sounds normal.  Lymphadenopathy:    He has no cervical adenopathy.  Neurological: He is alert and oriented to person, place, and time. He has normal reflexes. No cranial nerve deficit. He exhibits normal muscle tone. Coordination normal.          Assessment & Plan:  Vertigo. Try Claritin daily and drink plenty of fluids. Add Meclizine prn. Recheck prn

## 2015-02-09 NOTE — Progress Notes (Signed)
Pre visit review using our clinic review tool, if applicable. No additional management support is needed unless otherwise documented below in the visit note. 

## 2015-03-09 ENCOUNTER — Other Ambulatory Visit: Payer: Self-pay | Admitting: Cardiology

## 2015-03-09 DIAGNOSIS — I6523 Occlusion and stenosis of bilateral carotid arteries: Secondary | ICD-10-CM

## 2015-03-18 ENCOUNTER — Ambulatory Visit (INDEPENDENT_AMBULATORY_CARE_PROVIDER_SITE_OTHER): Payer: Medicare Other | Admitting: *Deleted

## 2015-03-18 DIAGNOSIS — Z954 Presence of other heart-valve replacement: Secondary | ICD-10-CM | POA: Diagnosis not present

## 2015-03-18 DIAGNOSIS — Z7901 Long term (current) use of anticoagulants: Secondary | ICD-10-CM

## 2015-03-18 DIAGNOSIS — Z5181 Encounter for therapeutic drug level monitoring: Secondary | ICD-10-CM

## 2015-03-18 DIAGNOSIS — Z952 Presence of prosthetic heart valve: Secondary | ICD-10-CM

## 2015-03-18 LAB — POCT INR: INR: 2.3

## 2015-04-09 ENCOUNTER — Ambulatory Visit (HOSPITAL_COMMUNITY)
Admission: RE | Admit: 2015-04-09 | Discharge: 2015-04-09 | Disposition: A | Discharge status: Home / Self Care | Payer: Medicare Other | Source: Ambulatory Visit | Attending: Cardiology | Admitting: Cardiology

## 2015-04-09 DIAGNOSIS — I6523 Occlusion and stenosis of bilateral carotid arteries: Secondary | ICD-10-CM | POA: Diagnosis not present

## 2015-04-09 DIAGNOSIS — E785 Hyperlipidemia, unspecified: Secondary | ICD-10-CM | POA: Diagnosis not present

## 2015-04-09 DIAGNOSIS — I10 Essential (primary) hypertension: Secondary | ICD-10-CM | POA: Insufficient documentation

## 2015-04-09 DIAGNOSIS — Z7901 Long term (current) use of anticoagulants: Secondary | ICD-10-CM | POA: Diagnosis not present

## 2015-04-14 ENCOUNTER — Telehealth: Payer: Self-pay | Admitting: Cardiology

## 2015-04-14 NOTE — Telephone Encounter (Signed)
Pt given results of recent carotid doppler.

## 2015-04-14 NOTE — Telephone Encounter (Signed)
New  Message ° °Pt returned call  °

## 2015-04-14 NOTE — Telephone Encounter (Signed)
LMTCB

## 2015-04-21 ENCOUNTER — Ambulatory Visit (INDEPENDENT_AMBULATORY_CARE_PROVIDER_SITE_OTHER): Payer: Medicare Other | Admitting: Family Medicine

## 2015-04-21 VITALS — BP 142/60 | HR 78 | Temp 98.0°F | Resp 18 | Ht 70.0 in | Wt 149.0 lb

## 2015-04-21 DIAGNOSIS — B029 Zoster without complications: Secondary | ICD-10-CM

## 2015-04-21 MED ORDER — VALACYCLOVIR HCL 1 G PO TABS: 1000.0000 mg | ORAL_TABLET | Freq: Three times a day (TID) | ORAL | Status: DC

## 2015-04-21 MED ORDER — HYDROCODONE-ACETAMINOPHEN 5-325 MG PO TABS: 1.0000 | ORAL_TABLET | ORAL | Status: DC | PRN

## 2015-04-21 NOTE — Patient Instructions (Addendum)
IF you received an x-ray today, you will receive an invoice from [facility]. Please contact [facility] at [phone] with questions or concerns regarding your invoice.   IF you received labwork today, you will receive an invoice from [employer]. Please contact [facility] at [phone] with questions or concerns regarding your invoice.   Our billing staff will not be able to assist you with questions regarding bills from these companies.  You will be contacted with the lab results as soon as they are available. The fastest way to get your results is to activate your My Chart account. Instructions are located on the last page of this paperwork. If you have not heard from [place] regarding the results in 2 weeks, please contact this office.     Shingles Shingles, which is also known as herpes zoster, is an infection that causes a painful skin rash and fluid-filled blisters. Shingles is not related to genital herpes, which is a sexually transmitted infection.   Shingles only develops in people who:  Have had chickenpox.  Have received the chickenpox vaccine. (This is rare.) CAUSES Shingles is caused by varicella-zoster virus (VZV). This is the same virus that causes chickenpox. After exposure to VZV, the virus stays in the body in an inactive (dormant) state. Shingles develops if the virus reactivates. This can happen many years after the initial exposure to VZV. It is not known what causes this virus to reactivate. RISK FACTORS People who have had chickenpox or received the chickenpox vaccine are at risk for shingles. Infection is more common in people who:  Are older than age 69.  Have a weakened defense (immune) system, such as those with HIV, AIDS, or cancer.  Are taking medicines that weaken the immune system, such as transplant medicines.  Are under great stress. SYMPTOMS Early symptoms of this condition include itching, tingling,  and pain in an area on your skin. Pain may be described as burning, stabbing, or throbbing. A few days or weeks after symptoms start, a painful red rash appears, usually on one side of the body in a bandlike or beltlike pattern. The rash eventually turns into fluid-filled blisters that break open, scab over, and dry up in about 2-3 weeks. At any time during the infection, you may also develop:  A fever.  Chills.  A headache.  An upset stomach. DIAGNOSIS This condition is diagnosed with a skin exam. Sometimes, skin or fluid samples are taken from the blisters before a diagnosis is made. These samples are examined under a microscope or sent to a lab for testing. TREATMENT There is no specific cure for this condition. Your health care provider will probably prescribe medicines to help you manage pain, recover more quickly, and avoid long-term problems. Medicines may include:  Antiviral drugs.  Anti-inflammatory drugs.  Pain medicines. If the area involved is on your face, you may be referred to a specialist, such as an eye doctor (ophthalmologist) or an ear, nose, and throat (ENT) doctor to help you avoid eye problems, chronic pain, or disability. HOME CARE INSTRUCTIONS Medicines  Take medicines only as directed by your health care provider.  Apply an anti-itch or numbing cream to the affected area as directed by your health care provider. Blister and Rash Care  Take a cool bath or apply cool compresses to the area of the rash or blisters as directed by your health care provider. This may help with pain and itching.  Keep your rash covered with  a loose bandage (dressing). Wear loose-fitting clothing to help ease the pain of material rubbing against the rash.  Keep your rash and blisters clean with mild soap and cool water or as directed by your health care provider.  Check your rash every day for signs of infection. These include redness, swelling, and pain that lasts or  increases.  Do not pick your blisters.  Do not scratch your rash. General Instructions  Rest as directed by your health care provider.  Keep all follow-up visits as directed by your health care provider. This is important.  Until your blisters scab over, your infection can cause chickenpox in people who have never had it or been vaccinated against it. To prevent this from happening, avoid contact with other people, especially:  Babies.  Pregnant women.  Children who have eczema.  Elderly people who have transplants.  People who have chronic illnesses, such as leukemia or AIDS. SEEK MEDICAL CARE IF:  Your pain is not relieved with prescribed medicines.  Your pain does not get better after the rash heals.  Your rash looks infected. Signs of infection include redness, swelling, and pain that lasts or increases. SEEK IMMEDIATE MEDICAL CARE IF:  The rash is on your face or nose.  You have facial pain, pain around your eye area, or loss of feeling on one side of your face.  You have ear pain or you have ringing in your ear.  You have loss of taste.  Your condition gets worse.   This information is not intended to replace advice given to you by your health care provider. Make sure you discuss any questions you have with your health care provider.   Document Released: 01/03/2005 Document Revised: 01/24/2014 Document Reviewed: 11/14/2013 Elsevier Interactive Patient Education [employer].

## 2015-04-21 NOTE — Progress Notes (Signed)
Subjective:  By signing my name below, I, David Benton, attest that this documentation has been prepared under the direction and in the presence of Delman Cheadle, MD Electronically Signed: Ladene Artist, ED Scribe 04/21/2015 at 10:34 AM.   Patient ID: David Benton, male    DOB: May 13, 1929, 82 y.o.   MRN: [id number] Chief Complaint  Patient presents with  . Shoulder Pain    right  . Rash    right arm and back   HPI HPI Comments: David Benton is a 82 y.o. male, with a h/o shingles, who presents to the [facility] complaining of a painful rash to the right arm and posterior right shoulder first noticed 1 week ago. Pt states that he played golf approximately 1 week ago and suspected that he pulled a muscle, however, he noticed a painful rash the following day. No treatments tried PTA. Pt has tried codeine in the past but reports emesis with this medication. He reports h/o shingles on the left side of his face several years ago. Pt has not received a shingles vaccine.   Past Medical History  Diagnosis Date  . Shingles   . Thoracic aneurysm without mention of rupture     followed by [name] with yearly CT scans   . Heart valve replaced by other means   . Aortic valve disorders   . Unspecified essential hypertension   . Other and unspecified hyperlipidemia   . Personal history of colonic polyps   . External hemorrhoids without mention of complication   . Other constipation   . Personal history of malignant neoplasm of prostate   . Personal history of other disorders of nervous system and sense organs   . Meniere's disease   . AVD (aortic valve disease) 02/25/2010    sees [name]   . Long term (current) use of anticoagulants 09/15/2010  . SHINGLES 11/26/2009   Current Outpatient Prescriptions on File Prior to Visit  Medication Sig Dispense Refill  . amoxicillin (AMOXIL) 500 MG capsule TAKE FOUR CAPSULES BY MOUTH ONE HOUR PRIOR TO DENTAL APPOINTMENT AS  DIRECTED. 12 capsule 3  . atorvastatin (LIPITOR) 40 MG tablet TAKE ONE TABLET BY MOUTH EVERY DAY 30 tablet 6  . TRIAMTERENE-HCTZ PO Take by mouth. 37.5-25 mg take 1 daily.     Marland Kitchen warfarin (COUMADIN) 5 MG tablet Take as directed by Coumadin clinic 45 tablet 3  . acetaminophen (TYLENOL) 325 MG tablet Take 650 mg by mouth as needed. Reported on 04/21/2015    . magnesium hydroxide (MILK OF MAGNESIA) 400 MG/5ML suspension Take 30 mLs by mouth daily as needed for indigestion. Reported on 04/21/2015    . meclizine (ANTIVERT) 25 MG tablet Take 1 tablet (25 mg total) by mouth every 4 (four) hours as needed for dizziness. (Patient not taking: Reported on 04/21/2015) 60 tablet 1  . Saline GEL Place into the nose daily as needed (as directed). Reported on 04/21/2015    . sodium chloride (OCEAN) 0.65 % SOLN nasal spray Place 1 spray into both nostrils as needed for congestion. Reported on 04/21/2015     No current facility-administered medications on file prior to visit.   Allergies  Allergen Reactions  . Codeine     REACTION: N/V   Review of Systems  Constitutional: Positive for fatigue. Negative for fever, chills, activity change and appetite change.  Gastrointestinal: Negative for nausea, vomiting and abdominal pain.  Musculoskeletal: Positive for myalgias, back pain and arthralgias. Negative  for joint swelling, gait problem, neck pain and neck stiffness.  Skin: Positive for rash.  Allergic/Immunologic: Positive for environmental allergies. Negative for immunocompromised state.  Hematological: Negative for adenopathy.  Psychiatric/Behavioral: Positive for sleep disturbance.  BP 142/60 mmHg  Pulse 78  Temp(Src) 98 F (36.7 C)  Resp 18  Ht 5\' 10"  (1.778 m)  Wt 149 lb (67.586 kg)  BMI 21.38 kg/m2    Objective:   Physical Exam  Constitutional: He is oriented to person, place, and time. He appears well-developed and well-nourished. No distress.  HENT:  Head: Normocephalic and atraumatic.  Eyes:  Conjunctivae and EOM are normal.  Neck: Neck supple. No tracheal deviation present.  Cardiovascular: Normal rate.   Pulmonary/Chest: Effort normal. No respiratory distress.  Musculoskeletal: Normal range of motion.  Neurological: He is alert and oriented to person, place, and time.  Skin: Skin is warm and dry. Rash noted.  Rash to T1-T2 coming down the posterior aspect of the right shoulder, posterior flexor surface of right arm and coming down the ulnar aspect all the way to the right hand.   Psychiatric: He has a normal mood and affect. His behavior is normal.  Nursing note and vitals reviewed.     Assessment & Plan:   1. Shingles     Meds ordered this encounter  Medications  . valACYclovir (VALTREX) 1000 MG tablet    Sig: Take 1 tablet (1,000 mg total) by mouth 3 (three) times daily.    Dispense:  21 tablet    Refill:  0  . HYDROcodone-acetaminophen (NORCO/VICODIN) 5-325 MG tablet    Sig: Take 1 tablet by mouth every 4 (four) hours as needed for moderate pain.    Dispense:  40 tablet    Refill:  0    I personally performed the services described in this documentation, which was scribed in my presence. The recorded information has been reviewed and considered, and addended by me as needed.  Delman Cheadle, MD MPH

## 2015-04-29 ENCOUNTER — Ambulatory Visit (INDEPENDENT_AMBULATORY_CARE_PROVIDER_SITE_OTHER): Payer: Medicare Other | Admitting: *Deleted

## 2015-04-29 DIAGNOSIS — Z954 Presence of other heart-valve replacement: Secondary | ICD-10-CM | POA: Diagnosis not present

## 2015-04-29 DIAGNOSIS — Z7901 Long term (current) use of anticoagulants: Secondary | ICD-10-CM

## 2015-04-29 DIAGNOSIS — Z5181 Encounter for therapeutic drug level monitoring: Secondary | ICD-10-CM

## 2015-04-29 DIAGNOSIS — Z952 Presence of prosthetic heart valve: Secondary | ICD-10-CM

## 2015-04-29 LAB — POCT INR: INR: 2.4

## 2015-05-13 ENCOUNTER — Ambulatory Visit (INDEPENDENT_AMBULATORY_CARE_PROVIDER_SITE_OTHER): Payer: Medicare Other | Admitting: Pharmacist

## 2015-05-13 DIAGNOSIS — Z5181 Encounter for therapeutic drug level monitoring: Secondary | ICD-10-CM

## 2015-05-13 DIAGNOSIS — Z954 Presence of other heart-valve replacement: Secondary | ICD-10-CM

## 2015-05-13 DIAGNOSIS — Z7901 Long term (current) use of anticoagulants: Secondary | ICD-10-CM

## 2015-05-13 DIAGNOSIS — Z952 Presence of prosthetic heart valve: Secondary | ICD-10-CM

## 2015-05-13 LAB — POCT INR: INR: 2.9

## 2015-05-15 ENCOUNTER — Other Ambulatory Visit: Payer: Self-pay | Admitting: Cardiology

## 2015-05-19 ENCOUNTER — Other Ambulatory Visit (INDEPENDENT_AMBULATORY_CARE_PROVIDER_SITE_OTHER): Payer: Medicare Other

## 2015-05-19 DIAGNOSIS — Z0000 Encounter for general adult medical examination without abnormal findings: Secondary | ICD-10-CM | POA: Diagnosis not present

## 2015-05-19 LAB — HEPATIC FUNCTION PANEL
ALK PHOS: 70 U/L (ref 39–117)
ALT: 20 U/L (ref 0–53)
AST: 19 U/L (ref 0–37)
Albumin: 4.3 g/dL (ref 3.5–5.2)
BILIRUBIN DIRECT: 0.4 mg/dL — AB (ref 0.0–0.3)
BILIRUBIN TOTAL: 2.1 mg/dL — AB (ref 0.2–1.2)
Total Protein: 6.5 g/dL (ref 6.0–8.3)

## 2015-05-19 LAB — BASIC METABOLIC PANEL
BUN: 24 mg/dL — AB (ref 6–23)
CHLORIDE: 103 meq/L (ref 96–112)
CO2: 32 mEq/L (ref 19–32)
Calcium: 9.5 mg/dL (ref 8.4–10.5)
Creatinine, Ser: 1.12 mg/dL (ref 0.40–1.50)
GFR: 66.42 mL/min (ref >60.00)
GLUCOSE: 111 mg/dL — AB (ref 70–99)
POTASSIUM: 4.1 meq/L (ref 3.5–5.1)
SODIUM: 140 meq/L (ref 135–145)

## 2015-05-19 LAB — POC URINALSYSI DIPSTICK (AUTOMATED)
BILIRUBIN UA: NEGATIVE
GLUCOSE UA: NEGATIVE
KETONES UA: NEGATIVE
Leukocytes, UA: NEGATIVE
NITRITE UA: NEGATIVE
PH UA: 6
Protein, UA: NEGATIVE
SPEC GRAV UA: 1.02
Urobilinogen, UA: 0.2

## 2015-05-19 LAB — LIPID PANEL
CHOL/HDL RATIO: 3
Cholesterol: 132 mg/dL (ref 0–200)
HDL: 46.6 mg/dL (ref >39.00)
LDL CALC: 73 mg/dL (ref 0–99)
NONHDL: 85.07
Triglycerides: 60 mg/dL (ref 0.0–149.0)
VLDL: 12 mg/dL (ref 0.0–40.0)

## 2015-05-19 LAB — CBC WITH DIFFERENTIAL/PLATELET
BASOS ABS: 0 10*3/uL (ref 0.0–0.1)
Basophils Relative: 1 % (ref 0.0–3.0)
EOS ABS: 0.1 10*3/uL (ref 0.0–0.7)
Eosinophils Relative: 3.7 % (ref 0.0–5.0)
HEMATOCRIT: 41.6 % (ref 39.0–52.0)
Hemoglobin: 14.4 g/dL (ref 13.0–17.0)
LYMPHS ABS: 1 10*3/uL (ref 0.7–4.0)
LYMPHS PCT: 25.1 % (ref 12.0–46.0)
MCHC: 34.7 g/dL (ref 30.0–36.0)
MCV: 88.7 fl (ref 78.0–100.0)
Monocytes Absolute: 0.5 10*3/uL (ref 0.1–1.0)
Monocytes Relative: 11.6 % (ref 3.0–12.0)
NEUTROS ABS: 2.4 10*3/uL (ref 1.4–7.7)
NEUTROS PCT: 58.6 % (ref 43.0–77.0)
PLATELETS: 213 10*3/uL (ref 150.0–400.0)
RBC: 4.69 Mil/uL (ref 4.22–5.81)
RDW: 14.8 % (ref 11.5–15.5)
WBC: 4 10*3/uL (ref 4.0–10.5)

## 2015-05-19 LAB — TSH: TSH: 3.31 u[IU]/mL (ref 0.35–4.50)

## 2015-05-19 LAB — PSA: PSA: 0.03 ng/mL — AB (ref 0.10–4.00)

## 2015-05-25 ENCOUNTER — Encounter: Payer: Self-pay | Admitting: Family Medicine

## 2015-05-25 ENCOUNTER — Ambulatory Visit (INDEPENDENT_AMBULATORY_CARE_PROVIDER_SITE_OTHER): Payer: Medicare Other | Admitting: Family Medicine

## 2015-05-25 VITALS — BP 124/73 | HR 67 | Temp 98.5°F | Ht 70.0 in | Wt 150.0 lb

## 2015-05-25 DIAGNOSIS — Z0000 Encounter for general adult medical examination without abnormal findings: Secondary | ICD-10-CM | POA: Diagnosis not present

## 2015-05-25 NOTE — Progress Notes (Signed)
Pre visit review using our clinic review tool, if applicable. No additional management support is needed unless otherwise documented below in the visit note. 

## 2015-05-26 NOTE — Progress Notes (Signed)
   Subjective:    Patient ID: David Benton, male    DOB: 1929-11-16, 82 y.o.   MRN: [id number]  HPI 82 yr old male for a well exam. He feels well. He was treated one month ago for a case of shingles on the right shoulder and down the right arm, and this is resolving nicely.    Review of Systems  Constitutional: Negative.   HENT: Negative.   Eyes: Negative.   Respiratory: Negative.   Cardiovascular: Negative.   Gastrointestinal: Negative.   Genitourinary: Negative.   Musculoskeletal: Negative.   Skin: Negative.   Neurological: Negative.   Psychiatric/Behavioral: Negative.        Objective:   Physical Exam  Constitutional: He is oriented to person, place, and time. He appears well-developed and well-nourished. No distress.  HENT:  Head: Normocephalic and atraumatic.  Right Ear: External ear normal.  Left Ear: External ear normal.  Nose: Nose normal.  Mouth/Throat: Oropharynx is clear and moist. No oropharyngeal exudate.  Eyes: Conjunctivae and EOM are normal. Pupils are equal, round, and reactive to light. Right eye exhibits no discharge. Left eye exhibits no discharge. No scleral icterus.  Neck: Neck supple. No JVD present. No tracheal deviation present. No thyromegaly present.  Cardiovascular: Normal rate, regular rhythm, normal heart sounds and intact distal pulses.  Exam reveals no gallop and no friction rub.   No murmur heard. Pulmonary/Chest: Effort normal and breath sounds normal. No respiratory distress. He has no wheezes. He has no rales. He exhibits no tenderness.  Abdominal: Soft. Bowel sounds are normal. He exhibits no distension and no mass. There is no tenderness. There is no rebound and no guarding.  Genitourinary: Rectum normal, prostate normal and penis normal. Guaiac negative stool. No penile tenderness.  Musculoskeletal: Normal range of motion. He exhibits no edema or tenderness.  Lymphadenopathy:    He has no cervical adenopathy.  Neurological: He is alert  and oriented to person, place, and time. He has normal reflexes. No cranial nerve deficit. He exhibits normal muscle tone. Coordination normal.  Skin: Skin is warm and dry. No rash noted. He is not diaphoretic. No pallor.  Several areas of erythema down the right arm  Psychiatric: He has a normal mood and affect. His behavior is normal. Judgment and thought content normal.          Assessment & Plan:  Well exam. We discussed diet and exercise.  [name], MD

## 2015-06-04 ENCOUNTER — Encounter: Payer: Self-pay | Admitting: *Deleted

## 2015-06-09 ENCOUNTER — Ambulatory Visit (INDEPENDENT_AMBULATORY_CARE_PROVIDER_SITE_OTHER): Payer: Medicare Other | Admitting: Cardiology

## 2015-06-09 ENCOUNTER — Encounter: Payer: Self-pay | Admitting: Cardiology

## 2015-06-09 ENCOUNTER — Ambulatory Visit (INDEPENDENT_AMBULATORY_CARE_PROVIDER_SITE_OTHER): Payer: Medicare Other | Admitting: *Deleted

## 2015-06-09 VITALS — BP 134/64 | HR 56 | Ht 70.0 in | Wt 150.8 lb

## 2015-06-09 DIAGNOSIS — Z5181 Encounter for therapeutic drug level monitoring: Secondary | ICD-10-CM

## 2015-06-09 DIAGNOSIS — Z7901 Long term (current) use of anticoagulants: Secondary | ICD-10-CM | POA: Diagnosis not present

## 2015-06-09 DIAGNOSIS — I712 Thoracic aortic aneurysm, without rupture, unspecified: Secondary | ICD-10-CM

## 2015-06-09 DIAGNOSIS — Z954 Presence of other heart-valve replacement: Secondary | ICD-10-CM | POA: Diagnosis not present

## 2015-06-09 DIAGNOSIS — Z952 Presence of prosthetic heart valve: Secondary | ICD-10-CM

## 2015-06-09 LAB — POCT INR: INR: 3.1

## 2015-06-09 NOTE — Patient Instructions (Signed)
Medication Instructions:  Your physician recommends that you continue on your current medications as directed. Please refer to the Current Medication list given to you today.   Labwork: None   Testing/Procedures: none  Follow-Up: Your physician wants you to follow-up in: 6 months with Dr Aundra Dubin. (November 2017) You will receive a reminder letter in the mail two months in advance. If you don't receive a letter, please call our office to schedule the follow-up appointment.       If you need a refill on your cardiac medications before your next appointment, please call your pharmacy.

## 2015-06-10 NOTE — Progress Notes (Signed)
Patient ID: David Benton, male   DOB: 1929-11-24, 82 y.o.   MRN: [id number] PCP: Dr. Sarajane Jews  82 yo with history of St Jude mechanical aortic valve (AS, bicuspid aortic valve) and thoracic aortic aneurysm presents for cardiology followup.  Echo in 9/16 showed normal EF and stable mechanical aortic valve.  CTA chest in 7/16 showed 4.4 cm thoracic aortic aneurysm.  No exertional dyspnea or chest pain.  He tries to walk a 10K once a week.  He walks 2-3 miles/day.  No stroke-like symptoms.  No BRBPR or melena.    ECG: NSR, PVCs, septal Qs  Labs (2/14): LDL 65, HDL 48, K 4.2, creatinine 1.1 Labs (7/14): creatinine 1.13 Labs (3/15): LDL 90, HDL 42 Labs (9/15): K 3.9, creatinine 1.2, HCT 39.7 Labs (5/17): K 4.1, creatinine 1.12, LDL 73, HCT 41.6  PMH: 1. Prostate cancer s/p prostectomy in 1995 2. Meniere's disease 3. Shingles 4. St Jude mechanical aortic valve #29 placed in 1991.  Patient had AS with bicuspid aortic valve. Echo (9/08) with EF 60%, normal mechanical aortic valve.  Echo (5/14) with EF 60%, mechanical aortic valve with normal function. Unable to tolerate ASA in addition to coumadin.  - Echo (9/16) with Ef 65-70%, normal mechanical aortic valve, focal basal septal hypertrophy, PA systolic pressure 35 mmHg.  5. H/o CCY 6. Hyperlipidemia 7. Thoracic aortic aneurysm: Ascending aorta 4.5 cm by CTA in 1/14.  Ascending aorta 4.6 cm by CTA in 7/14.  Ascending aorta 4.3 cm by CTA in 7/15.  Followed by [name].  - CTA chest (7/16): 4.4 cm ascending aorta aneurysm. 8. Transient monocular blindness 9. Carotid stenosis: 3/17 carotid dopplers with 40-59% RICA stenosis.   FH:  Father with MI  SH: Married, prior smoker.  Retired from [employer] (worked at this [employer] for about 40 yrs).  Lives in [place].  ROS: All systems reviewed and negative except as per HPI.   Current Outpatient Prescriptions  Medication Sig Dispense Refill  . acetaminophen (TYLENOL) 325 MG tablet Take 650 mg  by mouth as needed. Reported on 04/21/2015    . amoxicillin (AMOXIL) 500 MG capsule TAKE FOUR CAPSULES BY MOUTH ONE HOUR PRIOR TO DENTAL APPOINTMENT AS DIRECTED. 12 capsule 3  . atorvastatin (LIPITOR) 40 MG tablet TAKE ONE TABLET BY MOUTH EVERY DAY 30 tablet 6  . magnesium hydroxide (MILK OF MAGNESIA) 400 MG/5ML suspension Take 30 mLs by mouth daily as needed for indigestion. Reported on 04/21/2015    . Saline GEL Place into the nose daily as needed (as directed). Reported on 04/21/2015    . sodium chloride (OCEAN) 0.65 % SOLN nasal spray Place 1 spray into both nostrils as needed for congestion. Reported on 04/21/2015    . TRIAMTERENE-HCTZ PO Take by mouth. 37.5-25 mg take 1 daily.     Marland Kitchen warfarin (COUMADIN) 5 MG tablet Take as directed by Coumadin clinic as a 30 day supply 50 tablet 3   No current facility-administered medications for this visit.    BP 134/64 mmHg  Pulse 56  Ht 5\' 10"  (1.778 m)  Wt 150 lb 12.8 oz (68.402 kg)  BMI 21.64 kg/m2 General: NAD Neck: No JVD, no thyromegaly or thyroid nodule.  Lungs: Clear to auscultation bilaterally with normal respiratory effort. CV: Nondisplaced PMI.  Heart regular S1/S2, no S3/S4, mechanical S2, 1/6 SEM RUSB.  No peripheral edema.  No carotid bruit.  Normal pedal pulses.  Abdomen: Soft, nontender, no hepatosplenomegaly, no distention.   Neurologic: Alert and oriented x  3.  Psych: Normal affect. Extremities: No clubbing or cyanosis.   Assessment/Plan: 1. Mechanical aortic valve:  Crisp valve sound.  He is on coumadin with goal INR 2.5-3.5.  ASA was stopped due to severe nosebleeds and a forearm hematoma.  Valve looked ok by echo in 9/16.  Recent CBC was normal. 2. Thoracic aortic aneurysm: Bicuspid aortic valve disorder with AS (now corrected) and thoracic aortic aneurysm.  4.4 cm ascending aortic aneurysm in 7/16.  Follows regularly with [name].  BP is controlled.   3. Hyperlipidemia: Good lipids in 5/17. 4. Carotid stenosis: Repeat carotid  dopplers in 3/18.   Loralie Champagne 06/10/2015

## 2015-07-08 ENCOUNTER — Ambulatory Visit (INDEPENDENT_AMBULATORY_CARE_PROVIDER_SITE_OTHER): Payer: Medicare Other | Admitting: *Deleted

## 2015-07-08 DIAGNOSIS — Z5181 Encounter for therapeutic drug level monitoring: Secondary | ICD-10-CM | POA: Diagnosis not present

## 2015-07-08 DIAGNOSIS — Z954 Presence of other heart-valve replacement: Secondary | ICD-10-CM | POA: Diagnosis not present

## 2015-07-08 DIAGNOSIS — Z7901 Long term (current) use of anticoagulants: Secondary | ICD-10-CM | POA: Diagnosis not present

## 2015-07-08 DIAGNOSIS — Z952 Presence of prosthetic heart valve: Secondary | ICD-10-CM

## 2015-07-08 LAB — POCT INR: INR: 3.9

## 2015-07-14 ENCOUNTER — Other Ambulatory Visit: Payer: Self-pay | Admitting: *Deleted

## 2015-07-14 DIAGNOSIS — I712 Thoracic aortic aneurysm, without rupture, unspecified: Secondary | ICD-10-CM

## 2015-07-27 ENCOUNTER — Ambulatory Visit (INDEPENDENT_AMBULATORY_CARE_PROVIDER_SITE_OTHER): Payer: Medicare Other | Admitting: Pharmacist

## 2015-07-27 DIAGNOSIS — Z952 Presence of prosthetic heart valve: Secondary | ICD-10-CM

## 2015-07-27 DIAGNOSIS — Z7901 Long term (current) use of anticoagulants: Secondary | ICD-10-CM | POA: Diagnosis not present

## 2015-07-27 DIAGNOSIS — Z5181 Encounter for therapeutic drug level monitoring: Secondary | ICD-10-CM

## 2015-07-27 DIAGNOSIS — Z954 Presence of other heart-valve replacement: Secondary | ICD-10-CM

## 2015-07-27 LAB — POCT INR: INR: 3

## 2015-08-04 ENCOUNTER — Other Ambulatory Visit: Payer: Self-pay | Admitting: Nurse Practitioner

## 2015-08-20 ENCOUNTER — Ambulatory Visit
Admission: RE | Admit: 2015-08-20 | Discharge: 2015-08-20 | Disposition: A | Discharge status: Home / Self Care | Payer: Medicare Other | Source: Ambulatory Visit | Attending: Cardiothoracic Surgery | Admitting: Cardiothoracic Surgery

## 2015-08-20 ENCOUNTER — Encounter: Payer: Self-pay | Admitting: Cardiothoracic Surgery

## 2015-08-20 ENCOUNTER — Ambulatory Visit (INDEPENDENT_AMBULATORY_CARE_PROVIDER_SITE_OTHER): Payer: Medicare Other | Admitting: Cardiothoracic Surgery

## 2015-08-20 VITALS — BP 127/68 | HR 64 | Resp 20 | Ht 70.0 in | Wt 150.0 lb

## 2015-08-20 DIAGNOSIS — I712 Thoracic aortic aneurysm, without rupture, unspecified: Secondary | ICD-10-CM

## 2015-08-20 MED ORDER — IOPAMIDOL (ISOVUE-370) INJECTION 76%
75.0000 mL | Freq: Once | INTRAVENOUS | Status: AC | PRN
  Administered 2015-08-20: 75 mL via INTRAVENOUS

## 2015-08-20 NOTE — Progress Notes (Signed)
David Benton       [place] [address]             [phone]               David Benton [facility] [id number] Date of Birth: 02-12-1929  Referring:  Dr Einar Crow Primary Care: [name], MD  Chief Complaint:    Chief Complaint  Patient presents with  . Thoracic Aortic Aneurysm    1 year f/u with CTA Chest    History of Present Illness:    Patient returns to the office today in followup after aortic valve replacement with a 29 mm St. Jude mechanical valve (29A-101  SN [id number]) in August of 1991 by Dr Redmond Pulling for aortic stenosis and bicuspid aortic valve. He was seen in April of 2009 because of aortic dilation on echocardiogram.  Since that time he's had serial CT scans to evaluate the size of his aorta . At age 82 he remains active exercising daily and continues to work as a Psychologist, occupational at [employer] . He continues on Coumadin without evidence of significant bleeding complication .  He returns today with a followup CT scan of the chest one year followup  Current Activity/ Functional Status: Patient is  independent with mobility/ambulation, transfers, ADL's, IADL's.   Past Medical History:  Diagnosis Date  . Aortic valve disorders   . AVD (aortic valve disease) 02/25/2010   sees [name]   . External hemorrhoids without mention of complication   . Heart valve replaced by other means   . Long term (current) use of anticoagulants 09/15/2010  . Meniere's disease   . Other and unspecified hyperlipidemia   . Other constipation   . Personal history of colonic polyps   . Personal history of malignant neoplasm of prostate   . Personal history of other disorders of nervous system and sense organs   . Shingles   . SHINGLES 11/26/2009  . Thoracic aneurysm without mention of rupture    followed by [name] with yearly CT scans   . Unspecified essential hypertension     Past Surgical History:  Procedure Laterality Date  .  AORTIC VALVE REPLACEMENT  1991    st. jude's valve, per [name]   . COLONOSCOPY  06-28-10   per Dr. Henrene Pastor, benign polyps, no further scopes needed   . HEMORRHOID SURGERY    . PROSTATECTOMY  1995   per Dr. Roni Bread   . TONSILLECTOMY      Family History  Problem Relation Age of Onset  . Heart attack Father     mother  . Colon cancer Neg Hx   . Prostate cancer Brother   . Diabetes Brother   . Stroke Brother   . Heart disease Brother     carotid artery disease  . Hypertension Brother   . Breast cancer Sister     mastectomy at 40    Social History   Social History  . Marital status: Married    Spouse name: N/A  . Number of children: 0  . Years of education: 88   Occupational History  . retired   .  Retired   Social History Main Topics  . Smoking status: Former Smoker    Types: Cigarettes    Quit date: 02/06/1972  . Smokeless tobacco: Never Used  . Alcohol use 4.2 oz/week    7 Standard drinks or equivalent per week  Comment: glass of wine daily  . Drug use: No  . Sexual activity: No   Other Topics Concern  . Not on file   Social History Narrative   HSG, a couple of years of college. Married '78 - . No children. Work - [employer] years - retired.   Lives alone with wife and two dogs. ACP - Yes CPR, Yes - short-term mechanical ventilation. Does not want prolonged heroic measures in the face of irreversible disease. HCPOA - wife. Alternative is his brother: [name].     History  Smoking Status  . Former Smoker  . Types: Cigarettes  . Quit date: 02/06/1972  Smokeless Tobacco  . Never Used    History  Alcohol Use  . 4.2 oz/week  . 7 Standard drinks or equivalent per week    Comment: glass of wine daily     Allergies  Allergen Reactions  . Codeine     REACTION: N/V    Current Outpatient Prescriptions  Medication Sig Dispense Refill  . acetaminophen (TYLENOL) 325 MG tablet Take 650 mg by mouth as needed. Reported on 04/21/2015     . amoxicillin (AMOXIL) 500 MG capsule TAKE FOUR CAPSULES BY MOUTH ONE HOUR PRIOR TO DENTAL APPOINTMENT AS DIRECTED. 12 capsule 3  . atorvastatin (LIPITOR) 40 MG tablet TAKE 1 TABLET BY MOUTH EVERY DAY 30 tablet 11  . magnesium hydroxide (MILK OF MAGNESIA) 400 MG/5ML suspension Take 30 mLs by mouth daily as needed for indigestion. Reported on 04/21/2015    . Saline GEL Place into the nose daily as needed (as directed). Reported on 04/21/2015    . sodium chloride (OCEAN) 0.65 % SOLN nasal spray Place 1 spray into both nostrils as needed for congestion. Reported on 04/21/2015    . TRIAMTERENE-HCTZ PO Take by mouth. 37.5-25 mg take 1 daily.     Marland Kitchen warfarin (COUMADIN) 5 MG tablet Take as directed by Coumadin clinic as a 30 day supply 50 tablet 3   No current facility-administered medications for this visit.        Review of Systems:     Cardiac Review of Systems: Y or N  Chest Pain [  n ]  Resting SOB [n   ] Exertional SOB  [  n]  Orthopnea Florencio.Farrier  ]   Pedal Edema [ n  ]    Palpitations [n  ] Syncope  Florencio.Farrier  ]   Presyncope [ n  ]  General Review of Systems: [Y] = yes [  ]=no Constitional: recent weight change [  ]; anorexia [  ]; fatigue [  ]; nausea [  ]; night sweats [  ]; fever [  ]; or chills [  ];  Dental: poor dentition[ N ]; Last Dentist visit:[facility]ALL 2013   Eye : blurred vision [  ]; diplopia [   ]; vision changes [  ];  Amaurosis fugax[  ]; Resp: cough [  ];  wheezing[  ];  hemoptysis[  ]; shortness of breath[  ]; paroxysmal nocturnal dyspnea[  ]; dyspnea on exertion[  ]; or orthopnea[  ];  GI:  gallstones[  ], vomiting[  ];  dysphagia[  ]; melena[  ];  hematochezia [  ]; heartburn[  ];   Hx of  Colonoscopy[ NOT NEEDED ANY MORE ]; GU: kidney stones [  ]; hematuria[  ];   dysuria [  ];  nocturia[  ];  history of     obstruction [  ];             Skin: rash, swelling[   ];, hair loss[  ];  peripheral edema[  ];  or itching[  ]; Musculosketetal: myalgias[  ];  joint swelling[  ];  joint erythema[  ];  joint pain[  ];  back pain[  ];  Heme/Lymph: bruising[  ];  bleeding[  ];  anemia[  ];  Neuro: TIA[  ];  headaches[  ];  stroke[  ];  vertigo[  ];  seizures[  ];   paresthesias[  ];  difficulty walking[  ];  Psych:depression[  ]; anxiety[  ];  Endocrine: diabetes[  ];  thyroid dysfunction[  ];  Immunizations: Flu [ y  ]; Pneumococcal[ y IN [place]  ];  Other:  Physical Exam: BP 127/68 (BP Location: Left Arm, Patient Position: Sitting, Cuff Size: Normal)   Pulse 64   Resp 20   Ht 5\' 10"  (1.778 m)   Wt 150 lb (68 kg)   SpO2 98% Comment: RA  BMI 21.52 kg/m   General appearance: alert and cooperative Neurologic: intact Heart: regular rate and rhythm, S1, S2 normal, no murmur, crisp valve sound click no murmur of aortic insufficiency, rub or gallop and normal apical impulse Lungs: clear to auscultation bilaterally and normal percussion bilaterally Abdomen: soft, non-tender; bowel sounds normal; no masses,  no organomegaly Extremities: extremities normal, atraumatic, no cyanosis or edema Wound: Sternum is stable   Diagnostic Studies & Laboratory data:     Recent Radiology Findings: Ct Angio Chest Aorta W &/or Wo Contrast  Result Date: 08/20/2015 CLINICAL DATA:  Followup thoracic aortic aneurysm. History of prior aortic valve replacement surgery. Creatinine was obtained on site at [facility] at [address]Results: Creatinine 1.0 mg/dL. EXAM: CT ANGIOGRAPHY CHEST WITH CONTRAST TECHNIQUE: Multidetector CT imaging of the chest was performed using the standard protocol during bolus administration of intravenous contrast. Multiplanar CT image reconstructions and MIPs were obtained to evaluate the vascular anatomy. CONTRAST:  75 cc Isovue 370 COMPARISON:  07/31/2014 FINDINGS: Cardiovascular: The heart is enlarged but stable. No pericardial  effusion. Stable tortuosity and ectasia of the thoracic aorta. Fusiform aneurysmal dilatation of the ascending aorta is stable measuring 4.4 x 4.3 cm and previously measuring 4.4 x 4.3 cm. No dissection. Stable surgical changes from coronary artery bypass surgery. Stable calcifications at the major branch vessel ostia, most vertically the left subclavian artery. Stable coronary artery calcifications. The descending thoracic aorta is normal in caliber. No dissection. Mediastinum/Nodes: No mediastinal or hilar mass or adenopathy. The esophagus is grossly normal. Lungs/Pleura: No acute pulmonary findings. No infiltrates, edema or effusions. Minimal emphysematous changes. No interstitial lung disease or bronchiectasis. No worrisome pulmonary lesions. Dependent subpleural atelectasis. Upper Abdomen:  Moderate atherosclerotic calcifications involving the upper abdominal aorta along with ostial calcifications. No aneurysm or dissection. Musculoskeletal: No significant bony findings. Stable scattered hemangiomas. Review of the MIP images confirms the above findings. IMPRESSION: 1. Stable fusiform aneurysmal dilatation of the ascending aorta with maximum measurements of 4.4 x 4.3 cm. No dissection. Recommend annual imaging followup by CTA or MRA. This recommendation follows 2010 ACCF/AHA/AATS/ACR/ASA/SCA/SCAI/SIR/STS/SVM Guidelines for the Diagnosis and Management of Patients with Thoracic Aortic Disease. Circulation. 2010; 121: [id number] 2. Stable cardiac enlargement and coronary artery calcifications. 3. No mediastinal or hilar mass or adenopathy. 4. No significant or acute pulmonary findings. Electronically Signed   By: Marijo Sanes M.D.   On: 08/20/2015 10:59   I have independently reviewed the above radiology studies  and reviewed the findings with the patient.    Ct Angio Chest Aorta W/cm &/or Wo/cm  08/01/2013   CLINICAL DATA:  Thoracic aortic aneurysmal disease with history of aortic valve replacement.  EXAM:  CT ANGIOGRAPHY CHEST WITH CONTRAST  TECHNIQUE: Multidetector CT imaging of the chest was performed using the standard protocol during bolus administration of intravenous contrast. Multiplanar CT image reconstructions and MIPs were obtained to evaluate the vascular anatomy.  CONTRAST:  32mL OMNIPAQUE IOHEXOL 350 MG/ML SOLN  COMPARISON:  02/16/2012 and other prior CTA studies.  FINDINGS: Measured maximal diameter of the ascending thoracic aorta is 4.3 cm. The 4.6 cm measurement previously may have been slightly overestimated. There is no evidence of aortic dissection or intramural hemorrhage. The proximal arch measures 3.3 cm. The distal arch measures 3.2 cm. The descending thoracic aorta measures 3.2 cm. Stable mild atherosclerotic plaque involving the arch and proximal great vessels without evidence of ulcerated plaque or penetrating ulcer. Proximal great vessels showed no significant stenosis.  The heart size appears stable. There is no evidence of pleural or pericardial fluid. Lungs show no evidence of edema, consolidation or nodule. No enlarged lymph nodes are identified. Stable calcified plaque in the distribution of the LAD and left circumflex coronary arteries. Visualized upper abdomen is unremarkable. Bony structures are unremarkable.  Review of the MIP images confirms the above findings.  IMPRESSION: No further enlargement of the ascending thoracic aorta. Current maximal diameter measurement is 4.3 cm by CTA and appears to be stable compared to the 06/2010 study. Previous measurement of 4.6 cm may have been slightly overestimated based on axis of measurement.   Electronically Signed   By: Aletta Edouard M.D.   On: 08/01/2013 13:36    Ct Angio Chest Aorta W/cm &/or Wo/cm  08/14/2012   *RADIOLOGY REPORT*  Clinical Data:  15-month follow-up thoracic aortic aneurysm and liver lesion.  CT ANGIOGRAPHY CHEST  Technique:  Multidetector CT imaging of the chest using the standard protocol during bolus  administration of intravenous contrast. Multiplanar reconstructed images including MIPs were obtained and reviewed to evaluate the vascular anatomy.  Contrast: 21mL OMNIPAQUE IOHEXOL 350 MG/ML SOLN  Comparison: Prior CTA of the chest 02/16/2012  Findings:  Mediastinum: Unremarkable CT appearance of the thyroid gland.  No suspicious mediastinal or hilar adenopathy.  No soft tissue mediastinal mass.  The thoracic esophagus is unremarkable.  Heart/Vascular: Surgical changes of mechanical aortic valve repair. There is persistent dilatation of the aortic root up to 4.3 cm at the sinuses of Valsalva.  This is not significantly changed compared to prior.  The aorta measures approximately 4.2 cm at the sinotubular junction.  The ascending aorta is aneurysmally dilated with a maximal diameter of 4.6 cm, unchanged compared to prior.  The transverse aorta measures 3.4 cm.  The proximal descending thoracic aorta measures 3.6 cm.  Scattered atherosclerotic vascular calcifications.  Extensive atherosclerotic calcifications noted throughout the coronary arteries. Mild ectasia of the right subclavian artery to 1.6 cm. Stable borderline cardiomegaly.  Lungs/Pleura: Trace biapical pleural parenchymal scarring.  Trace dependent atelectasis.  The lungs are otherwise clear.  Upper Abdomen: Stable 6 - 7 mm hypoattenuating lesion in the posterior aspect of hepatic segments six.  No significant interval growth.  Sludge and/or small stones layer within the gallbladder lumen.  Bilateral renal cortical thinning with scattered areas of scarring.  Otherwise, unremarkable upper abdomen.  Bones: No acute fracture or aggressive appearing lytic or blastic osseous lesion.  T12 hemangioma.  Healed median sternotomy.  IMPRESSION:  1.  Stable ascending aortic aneurysm with maximal diameter of the ascending tubular segment at 4.6 cm and  maximal diameter of the sinotubular junction 4.3 cm.  2.  Stable sub centimeter hypoattenuating lesion in the  posterior right hepatic lobe.  In retrospect, this finding was likely present in 2012 (although it cannot be definitively identified on studies from 2010 or 2009) and is very likely benign. Recommend continued attention on routine follow-up imaging.  3.  Sludge and / or small stones in the gallbladder lumen.  4.  Atherosclerosis including multivessel coronary artery disease.  Signed,  Criselda Peaches, MD Vascular & Interventional Radiologist [facility] Radiology   Original Report Authenticated By: Jacqulynn Cadet, M.D.     Ct Angio Chest W/cm &/or Wo Cm  02/16/2012  *RADIOLOGY REPORT*  Clinical Data: Thoracic aortic aneurysm  CT ANGIOGRAPHY CHEST  Technique:  Multidetector CT imaging of the chest using the standard protocol during bolus administration of intravenous contrast. Multiplanar reconstructed images including MIPs were obtained and reviewed to evaluate the vascular anatomy.  Contrast: 141mL OMNIPAQUE IOHEXOL 350 MG/ML SOLN  Comparison: 06/24/2010  Findings: Aneurysmal dilatation of the ascending aorta has slightly increased.  Diameters at the sinus of Valsalva, sinotubular junction, and ascending aorta are 4.4 cm, 4.3 cm, and 4.6 cm. Previously, maximal diameter in the sinus of Valsalva was 4.3 cm while the ascending aorta was 4.2 cm.  Aortic valve replacement hardware remains stable in position.  Mitral valve calcifications are noted coronary artery calcifications in the left main, left anterior descending, and circumflex arteries are noted.  Mild atherosclerotic changes at the origin of the left common carotid artery and left subclavian artery are present without significant narrowing.  Innominate artery and right subclavian artery are ectatic measuring 1.8 and 1.8 cm in caliber respectively.  Subclavian arteries are patent.  Visualized common carotid arteries are patent.  Vertebral arteries are patent.  Left vertebral is diminutive.  No obvious filling defect in the pulmonary arterial tree to  suggest acute pulmonary thromboembolism.  No evidence of aortic transection or dissection.  Limited images of the abdomen demonstrate mild narrowing at the origin of the celiac axis with the appearance of median arcuate ligament syndrome.  SMA is patent.  Branch vessels of the celiac axis are grossly patent.  Atherosclerotic changes at the origin of both renal arteries is present.  Degree of narrowing is difficult to determine secondary to calcifications.  No evidence of abnormal mediastinal, hilar, or axillary adenopathy. No pericardial effusion.  No pneumothorax.  No pleural effusion.  Diffuse centrilobular emphysema is not significantly changed.  No new mass or consolidation.  Stable parenchymal scarring at the lung apices.  Bilateral renal scarring.  Adrenal glands are within normal limits. New sub  centimeter low density lesion in the right lobe of the liver has developed on image 131.  No destructive bone lesion.  T12 angioma is noted.  IMPRESSION: Aneurysmal dilatation has slightly increased.  Maximal diameter of the ascending aorta is 4.6 cm and was previously 4.2 cm.  Maximal diameter at the sinus of Valsalva is 4.4 cm and was previously 4.3 cm.  New sub centimeter liver lesion in the right lobe has developed. It is benign appearing but because it is new, follow-up CT or MRI of the liver in 6 months is recommended to ensure stability.  It may have been obscured on prior studies secondary to phase of liver enhancement.   Original Report Authenticated By: Marybelle Killings, M.D.       Recent Lab Findings: Lab Results  Component Value Date   WBC 4.0 05/19/2015   HGB 14.4 05/19/2015   HCT 41.6 05/19/2015   PLT 213.0 05/19/2015   GLUCOSE 111 (H) 05/19/2015   CHOL 132 05/19/2015   TRIG 60.0 05/19/2015   HDL 46.60 05/19/2015   LDLCALC 73 05/19/2015   ALT 20 05/19/2015   AST 19 05/19/2015   NA 140 05/19/2015   K 4.1 05/19/2015   CL 103 05/19/2015   CREATININE 1.12 05/19/2015   BUN 24 (H) 05/19/2015     CO2 32 05/19/2015   TSH 3.31 05/19/2015   INR 3.0 07/27/2015   Aortic Size Index=     4.6    /Body surface area is 1.83 meters squared. =  2.51  < 2.75 cm/m2      4% risk per year 2.75 to 4.25          8% risk per year > 4.25 cm/m2    20% risk per year  cross sectional area of aorta cm2/height in meters  16.6/1.77= 9.3   Assessment / Plan:    1 status post replacement of aortic valve with 29 mm mechanical St. Jude . In 1991  Aneurysmal dilatation of the ascending thoracic aorta which measures up to 4.4 cm in diameter on today's examination.An stable in size since last year    On review of the CT scan of the chest the ascending aorta appears basically stable still well under 5 cm in size .  I plan to see the patient back in one year with a followup CTA of the chest to follow his dilated ascending aorta He continues to be diligent about his dental care and is aware of endocarditis precautions  He notes that his Coumadin dosage has been stable, and since 1991 he's had no major bleeding complications related to his Coumadin.   Grace Isaac MD  Beeper [phone] Office [phone] 08/20/2015 11:11 AM

## 2015-08-26 ENCOUNTER — Ambulatory Visit (INDEPENDENT_AMBULATORY_CARE_PROVIDER_SITE_OTHER): Payer: Medicare Other | Admitting: *Deleted

## 2015-08-26 DIAGNOSIS — Z7901 Long term (current) use of anticoagulants: Secondary | ICD-10-CM

## 2015-08-26 DIAGNOSIS — Z954 Presence of other heart-valve replacement: Secondary | ICD-10-CM

## 2015-08-26 DIAGNOSIS — Z5181 Encounter for therapeutic drug level monitoring: Secondary | ICD-10-CM | POA: Diagnosis not present

## 2015-08-26 DIAGNOSIS — Z952 Presence of prosthetic heart valve: Secondary | ICD-10-CM

## 2015-08-26 LAB — POCT INR: INR: 2.9

## 2015-08-31 ENCOUNTER — Other Ambulatory Visit: Payer: Self-pay | Admitting: Cardiology

## 2015-09-30 ENCOUNTER — Ambulatory Visit (INDEPENDENT_AMBULATORY_CARE_PROVIDER_SITE_OTHER): Payer: Medicare Other | Admitting: Pharmacist

## 2015-09-30 DIAGNOSIS — Z5181 Encounter for therapeutic drug level monitoring: Secondary | ICD-10-CM

## 2015-09-30 DIAGNOSIS — Z954 Presence of other heart-valve replacement: Secondary | ICD-10-CM | POA: Diagnosis not present

## 2015-09-30 DIAGNOSIS — Z952 Presence of prosthetic heart valve: Secondary | ICD-10-CM

## 2015-09-30 DIAGNOSIS — Z7901 Long term (current) use of anticoagulants: Secondary | ICD-10-CM

## 2015-09-30 LAB — POCT INR: INR: 3.4

## 2015-10-01 DIAGNOSIS — H2513 Age-related nuclear cataract, bilateral: Secondary | ICD-10-CM | POA: Diagnosis not present

## 2015-11-11 ENCOUNTER — Ambulatory Visit (INDEPENDENT_AMBULATORY_CARE_PROVIDER_SITE_OTHER): Payer: Medicare Other | Admitting: Pharmacist

## 2015-11-11 DIAGNOSIS — Z952 Presence of prosthetic heart valve: Secondary | ICD-10-CM

## 2015-11-11 DIAGNOSIS — Z7901 Long term (current) use of anticoagulants: Secondary | ICD-10-CM | POA: Diagnosis not present

## 2015-11-11 DIAGNOSIS — Z5181 Encounter for therapeutic drug level monitoring: Secondary | ICD-10-CM | POA: Diagnosis not present

## 2015-11-11 LAB — POCT INR: INR: 2.4

## 2015-11-19 ENCOUNTER — Other Ambulatory Visit: Payer: Self-pay | Admitting: Cardiology

## 2015-12-16 ENCOUNTER — Ambulatory Visit (INDEPENDENT_AMBULATORY_CARE_PROVIDER_SITE_OTHER): Payer: Medicare Other | Admitting: *Deleted

## 2015-12-16 DIAGNOSIS — Z7901 Long term (current) use of anticoagulants: Secondary | ICD-10-CM

## 2015-12-16 DIAGNOSIS — Z952 Presence of prosthetic heart valve: Secondary | ICD-10-CM

## 2015-12-16 DIAGNOSIS — Z5181 Encounter for therapeutic drug level monitoring: Secondary | ICD-10-CM | POA: Diagnosis not present

## 2015-12-16 LAB — POCT INR: INR: 3.2

## 2016-01-12 ENCOUNTER — Encounter: Payer: Self-pay | Admitting: *Deleted

## 2016-01-13 NOTE — Progress Notes (Signed)
Cardiology Office Note    Date:  01/14/2016   ID:  Benton, [name] 03/05/1929, MRN [id number]  PCP:  Alysia Penna, MD  Cardiologist:  [name]  Chief Complaint: 6 Months follow up  History of Present Illness:   David Benton is a 82 y.o. male with hx of AVR and thoracic aortic aneurysm (followed by [name]), carotid stenosis, HTN and HLD who presented for follow up.   St Jude mechanical aortic valve #29 placed in 1991.  Patient had AS with bicuspid aortic valve. Last echo 09/2014 showed normal functioning mechanical aortic valve, EF of 65-70%, dilated ascending aorta measuring 45 mm, unchanged from 2014. Mild pulmonary hypertension.  Last CTA 08/2015 showed Stable fusiform aneurysmal dilatation of the ascending aorta with maximum measurements of 4.4 x 4.3 cm.  Last carotid doppler 3/17 showed 40-59% RICA stenosis. Repeat study in one year.   Last seen by [name] 06/10/2015. He was doing well on cardiac stand point.   Here today for follow up. Feeling well. Walks 20 minutes 3-4 times a days without angina or dyspnea. No melena, blood in his stool, dizziness, orthopnea or PND. Complains of balance issue and rigidity with rest. No fall.   Past Medical History:  Diagnosis Date  . Aortic valve disorders   . AVD (aortic valve disease) 02/25/2010   sees [name]   . External hemorrhoids without mention of complication   . Heart valve replaced by other means   . Long term (current) use of anticoagulants 09/15/2010  . Meniere's disease   . Other and unspecified hyperlipidemia   . Other constipation   . Personal history of colonic polyps   . Personal history of malignant neoplasm of prostate   . Personal history of other disorders of nervous system and sense organs   . Shingles   . SHINGLES 11/26/2009  . Thoracic aneurysm without mention of rupture    followed by [name] with yearly CT scans   . Unspecified essential hypertension     Past Surgical History:   Procedure Laterality Date  . AORTIC VALVE REPLACEMENT  1991    st. jude's valve, per [name]   . COLONOSCOPY  06-28-10   per Dr. Henrene Pastor, benign polyps, no further scopes needed   . HEMORRHOID SURGERY    . PROSTATECTOMY  1995   per Dr. Roni Bread   . TONSILLECTOMY      Current Medications: Prior to Admission medications   Medication Sig Start Date End Date Taking? Authorizing Provider  acetaminophen (TYLENOL) 325 MG tablet Take 650 mg by mouth as needed. Reported on 04/21/2015    Historical Provider, MD  amoxicillin (AMOXIL) 500 MG capsule TAKE FOUR CAPSULES BY MOUTH ONE HOUR PRIOR TO DENTAL APPOINTMENT AS DIRECTED. 10/23/14   Larey Dresser, MD  atorvastatin (LIPITOR) 40 MG tablet TAKE 1 TABLET BY MOUTH EVERY DAY 08/04/15   Larey Dresser, MD  magnesium hydroxide (MILK OF MAGNESIA) 400 MG/5ML suspension Take 30 mLs by mouth daily as needed for indigestion. Reported on 04/21/2015    Historical Provider, MD  Saline GEL Place into the nose daily as needed (as directed). Reported on 04/21/2015    Historical Provider, MD  sodium chloride (OCEAN) 0.65 % SOLN nasal spray Place 1 spray into both nostrils as needed for congestion. Reported on 04/21/2015    Historical Provider, MD  TRIAMTERENE-HCTZ PO Take by mouth. 37.5-25 mg take 1 daily.     Historical Provider, MD  warfarin (  COUMADIN) 5 MG tablet TAKE AS DIRECTED by coumadin clinic 11/19/15   Larey Dresser, MD    Allergies:   Codeine   Social History   Social History  . Marital status: Married    Spouse name: N/A  . Number of children: 0  . Years of education: 25   Occupational History  . retired    Social History Main Topics  . Smoking status: Former Smoker    Types: Cigarettes    Quit date: 02/06/1972  . Smokeless tobacco: Never Used  . Alcohol use 4.2 oz/week    7 Standard drinks or equivalent per week     Comment: glass of wine daily  . Drug use: No  . Sexual activity: No   Other Topics Concern  . None   Social History  Narrative   HSG, a couple of years of college. Married '78 - . No children. Work - [employer] 41 years - retired.   Lives alone with wife and two dogs. ACP - Yes CPR, Yes - short-term mechanical ventilation. Does not want prolonged heroic measures in the face of irreversible disease. HCPOA - wife. Alternative is his brother: Harvie Pessin [facility].      Family History:  The patient's family history includes Breast cancer in his sister; CAD in his father; Diabetes in his brother and brother; Heart attack (age of onset: 19) in his father; Heart disease in his brother; Hypertension in his brother and father; Prostate cancer in his brother; Stroke in his brother.   ROS:   Please see the history of present illness.    ROS All other systems reviewed and are negative.   PHYSICAL EXAM:   VS:  BP 138/70 (BP Location: Left Arm, Patient Position: Sitting, Cuff Size: Normal)   Pulse 68   Ht 5\' 10"  (1.778 m)   Wt 149 lb (67.6 kg)   BMI 21.38 kg/m    GEN: Well nourished, well developed, in no acute distress  HEENT: normal  Neck: no JVD, carotid bruits, or masses Cardiac: RRR;  Systolic murmurs, rubs, or gallops,no edema  Respiratory:  clear to auscultation bilaterally, normal work of breathing GI: soft, nontender, nondistended, + BS MS: no deformity or atrophy  Skin: warm and dry, no rash Neuro:  Alert and Oriented x 3, Strength and sensation are intact. Noted resting tremor of left hand and left leg. Forward tilt of trunk .  Psych: euthymic mood, full affect  Wt Readings from Last 3 Encounters:  01/14/16 149 lb (67.6 kg)  08/20/15 150 lb (68 kg)  06/09/15 150 lb 12.8 oz (68.4 kg)      Studies/Labs Reviewed:   EKG:  EKG is not ordered today.    Recent Labs: 05/19/2015: ALT 20; BUN 24; Creatinine, Ser 1.12; Hemoglobin 14.4; Platelets 213.0; Potassium 4.1; Sodium 140; TSH 3.31   Lipid Panel    Component Value Date/Time   CHOL 132 05/19/2015 0848   TRIG 60.0 05/19/2015 0848   HDL  46.60 05/19/2015 0848   CHOLHDL 3 05/19/2015 0848   VLDL 12.0 05/19/2015 0848   LDLCALC 73 05/19/2015 0848    Additional studies/ records that were reviewed today include:   As above  Echocardiogram: 09/2014 Study Conclusions  - Left ventricle: The cavity size was normal. There was severe   focal basal and mild concentric hypertrophy of the septum.   Systolic function was vigorous. The estimated ejection fraction   was in the range of 65% to 70%. Wall  motion was normal; there   were no regional wall motion abnormalities. Doppler parameters   are consistent with abnormal left ventricular relaxation (grade 1   diastolic dysfunction). Doppler parameters are consistent with   elevated ventricular end-diastolic filling pressure. - Aortic valve: There was trivial regurgitation. - Ascending aorta: The ascending aorta was dilated measuring 45 mm. - Mitral valve: Calcified annulus. Mildly thickened leaflets .   There was mild regurgitation. - Left atrium: The atrium was normal in size. - Right ventricle: Systolic function was normal. - Tricuspid valve: There was mild-moderate regurgitation. - Pulmonary arteries: Systolic pressure was mildly increased. PA   peak pressure: 35 mm Hg (S).  Impressions:  - Normally funtioning mechanical aortic valve. Unchanged gradients   from 2014.   Dilated ascending aorta measuring 45 mm, unchanged from 2014.   Mild pulmonary hypertension.   ASSESSMENT & PLAN:    1. AVR (29 mm mechanical St. Jude . In 1991) - ASA was stopped due to severe nosebleeds and a forearm hematoma.  Valve looked ok by echo in 9/16. No dyspnea, dizziness or syncope.  - Continue coumadin per pharmacy. Will update echo.   2. Thoracic aortic aneurysm - Followed by [name]. Last CTA 08/2015 showed Stable fusiform aneurysmal dilatation of the ascending aorta with maximum measurements of 4.4 x 4.3 cm.  3. Carotid doppler  - repeat study in 03/2016.  4. Balance issue -  Noted resting tremor of left hand and left leg. Forward tilt of trunk . Also has rigidity. Concern for Parkinson's disease. Advised to F/u with PCP.   5. HTN - Stable and well controlled.  Will send message to [name] about 6 months follow up.   Medication Adjustments/Labs and Tests Ordered: Current medicines are reviewed at length with the patient today.  Concerns regarding medicines are outlined above.  Medication changes, Labs and Tests ordered today are listed in the Patient Instructions below. Patient Instructions  Medication Instructions:  Your physician recommends that you continue on your current medications as directed. Please refer to the Current Medication list given to you today.   Labwork: NONE  Testing/Procedures: Your physician has requested that you have an echocardiogram. Echocardiography is a painless test that uses sound waves to create images of your heart. It provides your doctor with information about the size and shape of your heart and how well your heart's chambers and valves are working. This procedure takes approximately one hour. There are no restrictions for this procedure.    Follow-Up: Your physician wants you to follow-up in: 6 MONTHS WITH Dr. END. You will receive a reminder letter in the mail two months in advance. If you don't receive a letter, please call our office to schedule the follow-up appointment.   Any Other Special Instructions Will Be Listed Below (If Applicable).     If you need a refill on your cardiac medications before your next appointment, please call your pharmacy.     Jarrett Soho, [place]  01/14/2016 10:51 AM    [facility] [address], [place], [place]  [address] Phone: [phone]; Fax: [phone]

## 2016-01-14 ENCOUNTER — Ambulatory Visit (INDEPENDENT_AMBULATORY_CARE_PROVIDER_SITE_OTHER): Payer: Medicare Other | Admitting: Physician Assistant

## 2016-01-14 ENCOUNTER — Encounter: Payer: Self-pay | Admitting: Physician Assistant

## 2016-01-14 VITALS — BP 138/70 | HR 68 | Ht 70.0 in | Wt 149.0 lb

## 2016-01-14 DIAGNOSIS — I10 Essential (primary) hypertension: Secondary | ICD-10-CM

## 2016-01-14 DIAGNOSIS — Z952 Presence of prosthetic heart valve: Secondary | ICD-10-CM | POA: Diagnosis not present

## 2016-01-14 DIAGNOSIS — I712 Thoracic aortic aneurysm, without rupture, unspecified: Secondary | ICD-10-CM

## 2016-01-14 DIAGNOSIS — I359 Nonrheumatic aortic valve disorder, unspecified: Secondary | ICD-10-CM

## 2016-01-14 DIAGNOSIS — R2681 Unsteadiness on feet: Secondary | ICD-10-CM

## 2016-01-14 DIAGNOSIS — I6523 Occlusion and stenosis of bilateral carotid arteries: Secondary | ICD-10-CM | POA: Diagnosis not present

## 2016-01-14 NOTE — Progress Notes (Signed)
Followup with [name] would be fine.

## 2016-01-14 NOTE — Patient Instructions (Signed)
Medication Instructions:  Your physician recommends that you continue on your current medications as directed. Please refer to the Current Medication list given to you today.   Labwork: NONE  Testing/Procedures: Your physician has requested that you have an echocardiogram. Echocardiography is a painless test that uses sound waves to create images of your heart. It provides your doctor with information about the size and shape of your heart and how well your heart's chambers and valves are working. This procedure takes approximately one hour. There are no restrictions for this procedure.    Follow-Up: Your physician wants you to follow-up in: 6 MONTHS WITH Dr. END. You will receive a reminder letter in the mail two months in advance. If you don't receive a letter, please call our office to schedule the follow-up appointment.   Any Other Special Instructions Will Be Listed Below (If Applicable).     If you need a refill on your cardiac medications before your next appointment, please call your pharmacy.

## 2016-01-20 ENCOUNTER — Ambulatory Visit (INDEPENDENT_AMBULATORY_CARE_PROVIDER_SITE_OTHER): Payer: Medicare Other | Admitting: *Deleted

## 2016-01-20 DIAGNOSIS — Z7901 Long term (current) use of anticoagulants: Secondary | ICD-10-CM | POA: Diagnosis not present

## 2016-01-20 DIAGNOSIS — Z5181 Encounter for therapeutic drug level monitoring: Secondary | ICD-10-CM | POA: Diagnosis not present

## 2016-01-20 DIAGNOSIS — Z952 Presence of prosthetic heart valve: Secondary | ICD-10-CM

## 2016-01-20 LAB — POCT INR: INR: 1.9

## 2016-01-21 DIAGNOSIS — H6123 Impacted cerumen, bilateral: Secondary | ICD-10-CM | POA: Diagnosis not present

## 2016-01-21 DIAGNOSIS — H8102 Meniere's disease, left ear: Secondary | ICD-10-CM | POA: Diagnosis not present

## 2016-02-02 ENCOUNTER — Other Ambulatory Visit: Payer: Self-pay | Admitting: Cardiology

## 2016-02-05 ENCOUNTER — Telehealth: Payer: Self-pay | Admitting: Internal Medicine

## 2016-02-05 NOTE — Telephone Encounter (Signed)
This is okay to refill 

## 2016-02-05 NOTE — Telephone Encounter (Signed)
Mr. Heft is having a tooth extraction and is wanting ot know should he stop with his coumadin or what should he do . Please call

## 2016-02-05 NOTE — Telephone Encounter (Signed)
Called pt back and advised him to continue Coumadin for single dental extraction. No further questions.

## 2016-02-09 ENCOUNTER — Ambulatory Visit (HOSPITAL_COMMUNITY): Payer: Medicare Other | Attending: Internal Medicine

## 2016-02-09 ENCOUNTER — Other Ambulatory Visit: Payer: Self-pay

## 2016-02-09 ENCOUNTER — Ambulatory Visit (INDEPENDENT_AMBULATORY_CARE_PROVIDER_SITE_OTHER): Payer: Medicare Other | Admitting: *Deleted

## 2016-02-09 DIAGNOSIS — Z7901 Long term (current) use of anticoagulants: Secondary | ICD-10-CM | POA: Diagnosis not present

## 2016-02-09 DIAGNOSIS — I253 Aneurysm of heart: Secondary | ICD-10-CM | POA: Insufficient documentation

## 2016-02-09 DIAGNOSIS — I361 Nonrheumatic tricuspid (valve) insufficiency: Secondary | ICD-10-CM | POA: Diagnosis not present

## 2016-02-09 DIAGNOSIS — Z952 Presence of prosthetic heart valve: Secondary | ICD-10-CM

## 2016-02-09 DIAGNOSIS — I359 Nonrheumatic aortic valve disorder, unspecified: Secondary | ICD-10-CM

## 2016-02-09 DIAGNOSIS — I501 Left ventricular failure: Secondary | ICD-10-CM | POA: Diagnosis not present

## 2016-02-09 DIAGNOSIS — R9439 Abnormal result of other cardiovascular function study: Secondary | ICD-10-CM | POA: Insufficient documentation

## 2016-02-09 DIAGNOSIS — Z5181 Encounter for therapeutic drug level monitoring: Secondary | ICD-10-CM

## 2016-02-09 LAB — POCT INR: INR: 2.6

## 2016-02-11 ENCOUNTER — Ambulatory Visit (INDEPENDENT_AMBULATORY_CARE_PROVIDER_SITE_OTHER): Payer: Medicare Other | Admitting: Family Medicine

## 2016-02-11 ENCOUNTER — Encounter: Payer: Self-pay | Admitting: Family Medicine

## 2016-02-11 VITALS — BP 157/82 | HR 65 | Temp 97.9°F | Ht 70.0 in | Wt 147.0 lb

## 2016-02-11 DIAGNOSIS — R251 Tremor, unspecified: Secondary | ICD-10-CM

## 2016-02-11 NOTE — Progress Notes (Signed)
Pre visit review using our clinic review tool, if applicable. No additional management support is needed unless otherwise documented below in the visit note. 

## 2016-02-11 NOTE — Progress Notes (Signed)
   Subjective:    Patient ID: David Benton, male    DOB: June 28, 1929, 83 y.o.   MRN: [id number]  HPI Here to evaluate some tremors that he first noticed about one year ago. These started in the left hand and then spread to the right hand as well. The left hand is still the most pronounced. They seem to be more pronounced at rest and then diminish with intentional movements. He was not aware of any tremors in the legs but I noticed this today, as seen in my exam. Sometimes these tremors affect his movements, like eating soup with a spoon or shaving or with handwriting. He denies any family hx of tremors. No numbness or weakness. No vision changes.    Review of Systems  Constitutional: Negative.   Respiratory: Negative.   Cardiovascular: Negative.   Neurological: Positive for tremors. Negative for dizziness, seizures, syncope, facial asymmetry, speech difficulty, weakness, light-headedness, numbness and headaches.       Objective:   Physical Exam  Constitutional: He is oriented to person, place, and time. He appears well-developed. No distress.  Cardiovascular: Normal rate, regular rhythm, normal heart sounds and intact distal pulses.   Pulmonary/Chest: Effort normal and breath sounds normal.  Neurological: He is alert and oriented to person, place, and time. No cranial nerve deficit. He exhibits normal muscle tone. Coordination normal.  He has obvious resting tremors of both hands, the left is more obvious. He shows classic pill rolling. He also has a subtle resting tremor of the left leg and foot.           Assessment & Plan:  Tremors. These could be the beginnings of a movement disorder. We will have Neurology evaluate him further.  Alysia Penna, MD

## 2016-02-18 DIAGNOSIS — G20 Parkinson's disease: Secondary | ICD-10-CM

## 2016-02-18 DIAGNOSIS — G20A1 Parkinson's disease without dyskinesia, without mention of fluctuations: Secondary | ICD-10-CM

## 2016-02-18 HISTORY — DX: Parkinson's disease: G20

## 2016-02-18 HISTORY — DX: Parkinson's disease without dyskinesia, without mention of fluctuations: G20.A1

## 2016-03-02 ENCOUNTER — Ambulatory Visit (INDEPENDENT_AMBULATORY_CARE_PROVIDER_SITE_OTHER): Payer: Medicare Other | Admitting: *Deleted

## 2016-03-02 DIAGNOSIS — Z5181 Encounter for therapeutic drug level monitoring: Secondary | ICD-10-CM | POA: Diagnosis not present

## 2016-03-02 DIAGNOSIS — Z7901 Long term (current) use of anticoagulants: Secondary | ICD-10-CM | POA: Diagnosis not present

## 2016-03-02 DIAGNOSIS — Z952 Presence of prosthetic heart valve: Secondary | ICD-10-CM

## 2016-03-02 LAB — POCT INR: INR: 2.9

## 2016-03-03 NOTE — Progress Notes (Signed)
David Benton was seen today in the movement disorders clinic for neurologic consultation at the request of Alysia Penna, MD.  This patient is accompanied in the office by his spouse who supplements the history.  The consultation is for the evaluation of tremor.  Specific Symptoms:  Tremor: Yes.  , L hand is where it started 2-3 years ago (R hand started in last year and then Dr. Sarajane Jews noted in L leg and so had therapists at cardiac rehab but patient hadn't noted that) Family hx of similar:  Yes.   - father had "palsy" Voice: no change Sleep: sleeps well  Vivid Dreams:  Yes.    Acting out dreams:  No. Wet Pillows: No. Postural symptoms:  Yes.    Falls?  No. Bradykinesia symptoms: difficulty getting out of a chair and difficulty getting L leg in and out of the car Loss of smell:  Yes.   Loss of taste:  Yes.   Urinary Incontinence:  No. Difficulty Swallowing:  No. Handwriting, micrographia: No. Trouble with ADL's:  No.  Trouble buttoning clothing: Yes.   Depression:  Yes.  , and "irritable" Memory changes:  Yes.  , short term Hallucinations:  No.  visual distortions: Yes.  , rarel N/V:  No. Lightheaded:  No.  Syncope: No. Diplopia:  No. Dyskinesia:  No.  Neuroimaging has not previously been performed that I can see but patient states that he has had an MRI of the brain about 15 years ago due to tinnitus   PREVIOUS MEDICATIONS: none to date  ALLERGIES:   Allergies  Allergen Reactions  . Codeine     REACTION: N/V    CURRENT MEDICATIONS:  Outpatient Encounter Prescriptions as of 03/04/2016  Medication Sig  . acetaminophen (TYLENOL) 325 MG tablet Take 650 mg by mouth as needed. Reported on 04/21/2015  . amoxicillin (AMOXIL) 500 MG capsule TAKE FOUR CAPSULES BY MOUTH ONE HOUR PRIOR TO DENTAL APPOINTMENT AS DIRECTED.  Marland Kitchen atorvastatin (LIPITOR) 40 MG tablet TAKE 1 TABLET BY MOUTH EVERY DAY  . magnesium hydroxide (MILK OF MAGNESIA) 400 MG/5ML suspension Take 30 mLs by mouth daily  as needed for indigestion. Reported on 04/21/2015  . Saline GEL Place into the nose daily as needed (as directed). Reported on 04/21/2015  . sodium chloride (OCEAN) 0.65 % SOLN nasal spray Place 1 spray into both nostrils as needed for congestion. Reported on 04/21/2015  . TRIAMTERENE-HCTZ PO Take by mouth. 37.5-25 mg take 1 daily.   Marland Kitchen warfarin (COUMADIN) 5 MG tablet TAKE AS DIRECTED by coumadin clinic   No facility-administered encounter medications on file as of 03/04/2016.     PAST MEDICAL HISTORY:   Past Medical History:  Diagnosis Date  . AVD (aortic valve disease) 02/25/2010   sees [name]   . External hemorrhoids without mention of complication   . Heart valve replaced by other means   . Long term (current) use of anticoagulants 09/15/2010  . Meniere's disease   . Other and unspecified hyperlipidemia   . Other constipation   . Personal history of colonic polyps   . Personal history of malignant neoplasm of prostate   . Shingles   . SHINGLES 11/26/2009  . Thoracic aneurysm without mention of rupture    followed by [name] with yearly CT scans   . Unspecified essential hypertension     PAST SURGICAL HISTORY:   Past Surgical History:  Procedure Laterality Date  . [place]  st. jude's valve, per [name]   . COLONOSCOPY  06-28-10   per Dr. Henrene Pastor, benign polyps, no further scopes needed   . HEMORRHOID SURGERY    . PROSTATECTOMY  1995   per Dr. Roni Bread   . TONSILLECTOMY      SOCIAL HISTORY:   Social History   Social History  . Marital status: Married    Spouse name: N/A  . Number of children: 0  . Years of education: 66   Occupational History  . retired     [employer] film and Engineer, production   Social History Main Topics  . Smoking status: Former Smoker    Types: Cigarettes    Quit date: 02/06/1972  . Smokeless tobacco: Never Used  . Alcohol use 4.2 oz/week    7 Standard drinks or equivalent per week     Comment: glass of wine daily    . Drug use: No  . Sexual activity: No   Other Topics Concern  . Not on file   Social History Narrative   HSG, a couple of years of college. Married '78 - . No children. Work - [employer] 34 years - retired.   Lives alone with wife and two dogs. ACP - Yes CPR, Yes - short-term mechanical ventilation. Does not want prolonged heroic measures in the face of irreversible disease. HCPOA - wife. Alternative is his brother: Demitrus [facility].     FAMILY HISTORY:   Family Status  Relation Status  . Father Deceased at age 58  . Brother Deceased  . Brother Deceased  . Mother Deceased at age 61   CAD, MI-fatal, DM  . Brother Deceased   DM  . Sister Deceased   born 63  . Brother Deceased  . Brother Alive  . Sister Deceased   born 47  . Maternal Grandmother Deceased  . Maternal Grandfather Other  . Paternal Grandmother Deceased  . Paternal Grandfather Deceased  . Neg Hx     ROS:  A complete 10 system review of systems was obtained and was unremarkable apart from what is mentioned above.  PHYSICAL EXAMINATION:    VITALS:   Vitals:   03/04/16 0924  BP: (!) 144/82  Pulse: 75  SpO2: 99%  Weight: 147 lb 2 oz (66.7 kg)  Height: _0  (1.778 m)    GEN:  The patient appears stated age and is in NAD. HEENT:  Normocephalic, atraumatic.  The mucous membranes are moist. The superficial temporal arteries are without ropiness or tenderness. CV:  RRR Lungs:  CTAB Neck/HEME:  There are no carotid bruits bilaterally.  Neurological examination:  Orientation: The patient is alert and oriented x3. Fund of knowledge is appropriate.  Recent and remote memory are intact.  Attention and concentration are normal.    Able to name objects and repeat phrases. Cranial nerves: There is good facial symmetry.   There is mild facial hypomimia.   Pupils are equal round and reactive to light bilaterally. Fundoscopic exam is attempted but the disc margins are not well visualized bilaterally.  Extraocular muscles are intact. The visual fields are full to confrontational testing. The speech is fluent and clear. Soft palate rises symmetrically and there is no tongue deviation. Hearing is intact to conversational tone. Sensation: Sensation is intact to light and pinprick throughout (facial, trunk, extremities). Vibration is intact at the bilateral big toe. There is no extinction with double simultaneous stimulation. There is no sensory dermatomal level identified. Motor: Strength is  5/5 in the bilateral upper and lower extremities.   Shoulder shrug is equal and symmetric.  There is no pronator drift. Deep tendon reflexes: Deep tendon reflexes are 2/4 at the bilateral biceps, triceps, brachioradialis, patella and achilles. Plantar responses are downgoing bilaterally.  Movement examination: Tone: There is mild increased tone in the bilateral upper extremities.  The tone in the lower extremities is normal.  Abnormal movements: There is LUE resting tremor and with distraction there is LLE resting tremor.   Coordination:  There is mild decremation with RAM's, mostly with finger taps on the L.  All other RAMs are normal Gait and Station: The patient has no difficulty arising out of a deep-seated chair without the use of the hands. The patient's stride length is good.  The patient has a negative pull test.      LABS:  Lab Results  Component Value Date   TSH 3.31 05/19/2015     Chemistry      Component Value Date/Time   NA 140 05/19/2015 0848   K 4.1 05/19/2015 0848   CL 103 05/19/2015 0848   CO2 32 05/19/2015 0848   BUN 24 (H) 05/19/2015 0848   CREATININE 1.12 05/19/2015 0848   CREATININE 1.09 07/30/2014 1400      Component Value Date/Time   CALCIUM 9.5 05/19/2015 0848   ALKPHOS 70 05/19/2015 0848   AST 19 05/19/2015 0848   ALT 20 05/19/2015 0848   BILITOT 2.1 (H) 05/19/2015 0848       ASSESSMENT/PLAN:  1.  Parkinsonism.  I suspect that this does represent early idiopathic  Parkinson's disease.    -We discussed the diagnosis as well as pathophysiology of the disease.  We discussed treatment options as well as prognostic indicators.  Patient education was provided.  -Greater than 50% of the 60 minute visit was spent in counseling answering questions and talking about what to expect now as well as in the future.  We talked about medication options as well as potential future surgical options.  We talked about safety in the home.  -offered meds and talked about benefits of doing so but pt wishes to hold on that for now and we can readdress in the near future  -We discussed community resources in the area including patient support groups and community exercise programs for PD and pt education was provided to the patient.  -Pt doesn't think that his aortic valve is MRI compatible so we will do CT brain.  Told him we will see small vessel disease and likely some atrophy but want to make sure that not missing anything else.   -met with our social worker today.  2.  Follow up is anticipated in the next 4 months, sooner should new neurologic issues arise.    Cc:  Alysia Penna, MD

## 2016-03-04 ENCOUNTER — Encounter: Payer: Self-pay | Admitting: Neurology

## 2016-03-04 ENCOUNTER — Ambulatory Visit (INDEPENDENT_AMBULATORY_CARE_PROVIDER_SITE_OTHER): Payer: Medicare Other | Admitting: Neurology

## 2016-03-04 VITALS — BP 144/82 | HR 75 | Ht 70.0 in | Wt 147.1 lb

## 2016-03-04 DIAGNOSIS — R251 Tremor, unspecified: Secondary | ICD-10-CM

## 2016-03-04 DIAGNOSIS — G20 Parkinson's disease: Secondary | ICD-10-CM

## 2016-03-04 NOTE — Progress Notes (Signed)
Clinical Social Work Note  CSW received request to meet with pt and pt wife during today's initial visit. Per [name], pt with new diagnosis of Parkinsonism, suspecting it represents early idiopathic Parkinson's disease. CSW introduced self and explained role. Informed of social work services including connection to [employer], short-term coping/problem-solving counseling, and community involvement. Pt and pt wife live in one story home in [place] with their two dogs. CSW provided supportive listening surrounding pt and pt wife reflection of visit with [name] today. Pt shared that he is an avid walker and pt and pt wife go on "walking" vacations to places such as [place]. CSW provided positive reinforcement for the commitment that pt already has to exercise and discussed Parkinson's specific exercise programs. CSW discussed PD cycling class at [facility]. Pt wife reports that they live near [facility] and pt wife is familiar with [employer] where [employer] is held as she goes to a class at [employer]. Pt expressed interest in exploring both of these options for additional exercise and CSW encouraged pt to observe classes for both [employer] PD cycling and [employer]. CSW discussed [employer] and the annual Walk/Run/Bike for Parkinson's in April and the proceeds from the [employer] support scholarships for the exercise programs. CSW discussed educational material through the [employer]. Pt wife had questions about nutrition and CSW clarified and provided educational information to pt wife. CSW discussed the [employer] over [employer] and discussed benefits of attending support group.   CSW recognized that pt and pt wife received a lot of information during today's initial visit which may take time to process. CSW provided pt and pt wife CSW contact information and encouraged them to contact CSW with any  follow up questions or other psychosocial needs arise before next visit.  Clinical Social Worker Interventions & Plan: 1. Resource education & referral: Provided information on PD community exercise programs and support group. Provided educational material.  2. Provided supportive listening and counseling surrounding new diagnosis.  3. Will continue to follow to assess psychosocial needs and provide support.  Alison Murray, MSW, LCSW Clinical Social Worker [facility] [phone]

## 2016-03-11 ENCOUNTER — Telehealth: Payer: Self-pay | Admitting: Neurology

## 2016-03-11 ENCOUNTER — Telehealth: Payer: Self-pay | Admitting: Clinical

## 2016-03-11 NOTE — Telephone Encounter (Signed)
Received phone call from pt. Pt stated that he completed paperwork for PD cycling class, but paperwork required physician signature. CSW inquired with [name] CMA, Luvenia Starch and pt is followed by cardiology and cardiologist needs to sign off on PD cycling class. Notified pt. Pt expressed that he just started seeing a new cardiologist and was unsure if new cardiologist would sign off. CSW discussed that cardiologist should have pt records to review and to notify us if pt runs into any barriers. Pt expressed understanding and will contact cardiologist.  Alison Murray, MSW, LCSW Clinical Social Worker [facility] [phone]

## 2016-03-14 ENCOUNTER — Telehealth: Payer: Self-pay | Admitting: Internal Medicine

## 2016-03-14 NOTE — Telephone Encounter (Signed)
Walk In pt form- [employer] papers dropped off gave to [place].

## 2016-03-15 ENCOUNTER — Telehealth: Payer: Self-pay | Admitting: Clinical

## 2016-03-15 NOTE — Telephone Encounter (Signed)
Pt called asking about CT scan. States that he has not heard from [facility] about scheduling. Pt says that a week ago he was out and came home and noticed a missed call from [facility], but no message was left. Pt unsure if he should contact [facility] or wait until contacted again. Please advise pt on how to proceed. Phone number is [phone]. Thanks.

## 2016-03-15 NOTE — Telephone Encounter (Signed)
Patient made aware he can call [facility] directly to schedule. He was given number.

## 2016-03-16 ENCOUNTER — Telehealth: Payer: Self-pay | Admitting: Internal Medicine

## 2016-03-16 NOTE — Telephone Encounter (Signed)
David Benton is returning your call from yesterday . Thanks

## 2016-03-16 NOTE — Telephone Encounter (Signed)
[facility] advised [facility] Aundra Dubin to review-I expect Dr Aundra Dubin to review 03/21/16.

## 2016-03-18 ENCOUNTER — Telehealth: Payer: Self-pay | Admitting: Neurology

## 2016-03-18 ENCOUNTER — Ambulatory Visit
Admission: RE | Admit: 2016-03-18 | Discharge: 2016-03-18 | Disposition: A | Discharge status: Home / Self Care | Payer: Medicare Other | Source: Ambulatory Visit | Attending: Neurology | Admitting: Neurology

## 2016-03-18 ENCOUNTER — Other Ambulatory Visit: Payer: Self-pay | Admitting: Cardiology

## 2016-03-18 DIAGNOSIS — R251 Tremor, unspecified: Secondary | ICD-10-CM

## 2016-03-18 NOTE — Telephone Encounter (Signed)
-----   Message from Turner, DO sent at 03/18/2016  2:53 PM EST ----- Let pt know that CT brain is okay

## 2016-03-18 NOTE — Telephone Encounter (Signed)
Patient made aware.

## 2016-03-21 NOTE — Telephone Encounter (Signed)
Dr Aundra Dubin was going to complete Monday and leave on his cart in office.

## 2016-03-22 NOTE — Telephone Encounter (Signed)
Left detailed message on self identified voicemail that his forms have been signed and that they were ready to pick up at the front office.

## 2016-03-25 NOTE — Telephone Encounter (Signed)
Log at front desk indicates pt picked up forms 03/22/16

## 2016-03-30 ENCOUNTER — Other Ambulatory Visit: Payer: Self-pay | Admitting: Family Medicine

## 2016-03-30 ENCOUNTER — Ambulatory Visit (INDEPENDENT_AMBULATORY_CARE_PROVIDER_SITE_OTHER): Payer: Medicare Other | Admitting: *Deleted

## 2016-03-30 DIAGNOSIS — Z5181 Encounter for therapeutic drug level monitoring: Secondary | ICD-10-CM

## 2016-03-30 DIAGNOSIS — I6523 Occlusion and stenosis of bilateral carotid arteries: Secondary | ICD-10-CM

## 2016-03-30 DIAGNOSIS — Z952 Presence of prosthetic heart valve: Secondary | ICD-10-CM | POA: Diagnosis not present

## 2016-03-30 DIAGNOSIS — Z7901 Long term (current) use of anticoagulants: Secondary | ICD-10-CM | POA: Diagnosis not present

## 2016-03-30 LAB — POCT INR: INR: 3.3

## 2016-04-11 ENCOUNTER — Ambulatory Visit (HOSPITAL_COMMUNITY)
Admission: RE | Admit: 2016-04-11 | Discharge: 2016-04-11 | Disposition: A | Discharge status: Home / Self Care | Payer: Medicare Other | Source: Ambulatory Visit | Attending: Cardiology | Admitting: Cardiology

## 2016-04-11 DIAGNOSIS — I6523 Occlusion and stenosis of bilateral carotid arteries: Secondary | ICD-10-CM

## 2016-04-19 NOTE — Telephone Encounter (Signed)
Error

## 2016-05-04 ENCOUNTER — Ambulatory Visit (INDEPENDENT_AMBULATORY_CARE_PROVIDER_SITE_OTHER): Payer: Medicare Other | Admitting: Pharmacist

## 2016-05-04 DIAGNOSIS — Z7901 Long term (current) use of anticoagulants: Secondary | ICD-10-CM | POA: Diagnosis not present

## 2016-05-04 DIAGNOSIS — Z952 Presence of prosthetic heart valve: Secondary | ICD-10-CM | POA: Diagnosis not present

## 2016-05-04 DIAGNOSIS — Z5181 Encounter for therapeutic drug level monitoring: Secondary | ICD-10-CM | POA: Diagnosis not present

## 2016-05-04 LAB — POCT INR: INR: 2.9

## 2016-06-15 NOTE — Progress Notes (Signed)
David Benton was seen today in the movement disorders clinic for neurologic consultation at the request of [name], MD.  This patient is accompanied in the office by his spouse who supplements the history.  The consultation is for the evaluation of tremor.  06/27/16 update:  Patient seen today in follow-up for parkinsonism.  He is on no medication.  Pt denies falls.  Pt denies lightheadedness, near syncope.  No hallucinations.  Mood has been good.  CT brain done and reviewed since last visit and was non acute.  He had a carotid ultrasound on 04/11/2016.  It was normal, with 1-39% stenosis bilaterally.  He is doing rock steady boxing and he enjoys that.  He is also doing [employer] cycle class.    PREVIOUS MEDICATIONS: none to date  ALLERGIES:   Allergies  Allergen Reactions  . Codeine     REACTION: N/V    CURRENT MEDICATIONS:  Outpatient Encounter Prescriptions as of 06/27/2016  Medication Sig  . acetaminophen (TYLENOL) 325 MG tablet Take 650 mg by mouth as needed. Reported on 04/21/2015  . atorvastatin (LIPITOR) 40 MG tablet TAKE 1 TABLET BY MOUTH EVERY DAY  . magnesium hydroxide (MILK OF MAGNESIA) 400 MG/5ML suspension Take 30 mLs by mouth daily as needed for indigestion. Reported on 04/21/2015  . Saline GEL Place into the nose daily as needed (as directed). Reported on 04/21/2015  . sodium chloride (OCEAN) 0.65 % SOLN nasal spray Place 1 spray into both nostrils as needed for congestion. Reported on 04/21/2015  . TRIAMTERENE-HCTZ PO Take by mouth. 37.5-25 mg take 1 daily.   Marland Kitchen warfarin (COUMADIN) 5 MG tablet Take as directed by [facility]  . amoxicillin (AMOXIL) 500 MG capsule TAKE FOUR CAPSULES BY MOUTH ONE HOUR PRIOR TO DENTAL APPOINTMENT AS DIRECTED. (Patient not taking: Reported on 06/27/2016)   No facility-administered encounter medications on file as of 06/27/2016.     PAST MEDICAL HISTORY:   Past Medical History:  Diagnosis Date  . AVD (aortic valve disease) 02/25/2010   sees  [name]   . External hemorrhoids without mention of complication   . Heart valve replaced by other means   . Long term (current) use of anticoagulants 09/15/2010  . Meniere's disease   . Other and unspecified hyperlipidemia   . Other constipation   . Personal history of colonic polyps   . Personal history of malignant neoplasm of prostate   . Shingles   . SHINGLES 11/26/2009  . Thoracic aneurysm without mention of rupture    followed by [name] with yearly CT scans   . Unspecified essential hypertension     PAST SURGICAL HISTORY:   Past Surgical History:  Procedure Laterality Date  . AORTIC VALVE REPLACEMENT  1991    st. jude's valve, per [name]   . COLONOSCOPY  06-28-10   per Dr. Henrene Pastor, benign polyps, no further scopes needed   . HEMORRHOID SURGERY    . PROSTATECTOMY  1995   per Dr. Roni Bread   . TONSILLECTOMY      SOCIAL HISTORY:   Social History   Social History  . Marital status: Married    Spouse name: N/A  . Number of children: 0  . Years of education: 48   Occupational History  . 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 Smoking status: Former Smoker    Types: Cigarettes    Quit date: 02/06/1972  .  Smokeless tobacco: Never Used  . Alcohol use 4.2 oz/week    7 Standard drinks or equivalent per week     Comment: glass of wine daily  . Drug use: No  . Sexual activity: No   Other Topics Concern  . Not on file   Social History Narrative   HSG, a couple of years of college. Married '78 - . No children. Work - [employer] 65 years - retired.   Lives alone with wife and two dogs. ACP - Yes CPR, Yes - short-term mechanical ventilation. Does not want prolonged heroic measures in the face of irreversible disease. HCPOA - wife. Alternative is his brother: Luby [facility].     FAMILY HISTORY:   Family Status  Relation Status  . Father Deceased at age 97  . Brother Deceased  . Brother Deceased  . Mother  Deceased at age 58       CAD, MI-fatal, DM  . Brother Deceased       DM  . Sister Deceased       born 36  . Brother Deceased  . Brother Alive  . Sister Deceased       born 51  . MGM Deceased  . MGF Other  . PGM Deceased  . PGF Deceased  . Neg Hx (Not Specified)    ROS:  A complete 10 system review of systems was obtained and was unremarkable apart from what is mentioned above.  PHYSICAL EXAMINATION:    VITALS:   Vitals:   06/27/16 1338  BP: 130/60  Pulse: 68  SpO2: 97%  Weight: 149 lb (67.6 kg)  Height: 5\' 10"  (1.778 m)    GEN:  The patient appears stated age and is in NAD. HEENT:  Normocephalic, atraumatic.  The mucous membranes are moist. The superficial temporal arteries are without ropiness or tenderness. CV:  RRR Lungs:  CTAB Neck/HEME:  There are no carotid bruits bilaterally.  Neurological examination:  Orientation: The patient is alert and oriented x3. Fund of knowledge is appropriate.  Recent and remote memory are intact.  Attention and concentration are normal.    Able to name objects and repeat phrases. Cranial nerves: There is good facial symmetry.   There is mild facial hypomimia.   Extraocular muscles are intact. The visual fields are full to confrontational testing. The speech is fluent and clear. Soft palate rises symmetrically and there is no tongue deviation. Hearing is intact to conversational tone. Sensation: Sensation is intact to light touch throughout. Motor: Strength is 5/5 in the bilateral upper and lower extremities.   Shoulder shrug is equal and symmetric.  There is no pronator drift.   Movement examination: Tone: There is mild increased tone in the bilateral upper extremities  (some trouble relaxing).  The tone in the lower extremities is normal.  Abnormal movements: There is LUE resting tremor Coordination:  There is good RAMs today without decremation Gait and Station: The patient has no difficulty arising out of a deep-seated chair  without the use of the hands. The patient's stride length is good.  The patient has a negative pull test.      LABS:  Lab Results  Component Value Date   TSH 3.31 05/19/2015     Chemistry      Component Value Date/Time   NA 140 05/19/2015 0848   K 4.1 05/19/2015 0848   CL 103 05/19/2015 0848   CO2 32 05/19/2015 0848   BUN 24 (H) 05/19/2015 0848  CREATININE 1.12 05/19/2015 0848   CREATININE 1.09 07/30/2014 1400      Component Value Date/Time   CALCIUM 9.5 05/19/2015 0848   ALKPHOS 70 05/19/2015 0848   AST 19 05/19/2015 0848   ALT 20 05/19/2015 0848   BILITOT 2.1 (H) 05/19/2015 0848       ASSESSMENT/PLAN:  1.  Parkinsonism.  I suspect that this does represent early idiopathic Parkinson's disease.    -offered meds again and talked about benefits of doing so but pt still wishes to hold on that for now and we can readdress in the near future  -We discussed community resources in the area including patient support groups and community exercise programs for PD and pt education was provided to the patient.  Pt involved with [employer] cycle and rock steady and congratulated him on that.   2.  Follow up is anticipated in the next 6 months, sooner should new neurologic issues arise.  Much greater than 50% of this visit was spent in counseling and coordinating care.  Total face to face time:  25 min     Cc:  [name], MD

## 2016-06-22 ENCOUNTER — Ambulatory Visit (INDEPENDENT_AMBULATORY_CARE_PROVIDER_SITE_OTHER): Payer: Medicare Other | Admitting: *Deleted

## 2016-06-22 DIAGNOSIS — Z952 Presence of prosthetic heart valve: Secondary | ICD-10-CM

## 2016-06-22 DIAGNOSIS — Z7901 Long term (current) use of anticoagulants: Secondary | ICD-10-CM | POA: Diagnosis not present

## 2016-06-22 DIAGNOSIS — Z5181 Encounter for therapeutic drug level monitoring: Secondary | ICD-10-CM

## 2016-06-22 LAB — POCT INR: INR: 2.7

## 2016-06-27 ENCOUNTER — Ambulatory Visit (INDEPENDENT_AMBULATORY_CARE_PROVIDER_SITE_OTHER): Payer: Medicare Other | Admitting: Neurology

## 2016-06-27 ENCOUNTER — Encounter: Payer: Self-pay | Admitting: Neurology

## 2016-06-27 VITALS — BP 130/60 | HR 68 | Ht 70.0 in | Wt 149.0 lb

## 2016-06-27 DIAGNOSIS — G20 Parkinson's disease: Secondary | ICD-10-CM | POA: Diagnosis not present

## 2016-07-08 ENCOUNTER — Encounter: Payer: Self-pay | Admitting: Internal Medicine

## 2016-07-08 ENCOUNTER — Ambulatory Visit: Payer: Medicare Other | Admitting: Neurology

## 2016-07-08 ENCOUNTER — Ambulatory Visit (INDEPENDENT_AMBULATORY_CARE_PROVIDER_SITE_OTHER): Payer: Medicare Other | Admitting: Internal Medicine

## 2016-07-08 VITALS — BP 140/68 | HR 68 | Ht 70.0 in | Wt 148.4 lb

## 2016-07-08 DIAGNOSIS — I10 Essential (primary) hypertension: Secondary | ICD-10-CM | POA: Diagnosis not present

## 2016-07-08 DIAGNOSIS — I6523 Occlusion and stenosis of bilateral carotid arteries: Secondary | ICD-10-CM

## 2016-07-08 DIAGNOSIS — I712 Thoracic aortic aneurysm, without rupture, unspecified: Secondary | ICD-10-CM

## 2016-07-08 DIAGNOSIS — E785 Hyperlipidemia, unspecified: Secondary | ICD-10-CM | POA: Diagnosis not present

## 2016-07-08 DIAGNOSIS — I359 Nonrheumatic aortic valve disorder, unspecified: Secondary | ICD-10-CM | POA: Diagnosis not present

## 2016-07-08 NOTE — Patient Instructions (Signed)
Medication Instructions:  Your physician recommends that you continue on your current medications as directed. Please refer to the Current Medication list given to you today. David Benton: Lipid profile/CMET/CBCd today  Testing/Procedures: None   Follow-Up: Your physician recommends that you schedule a follow-up appointment in: 6 months with Dr End.        If you need a refill on your cardiac medications before your next appointment, please call your pharmacy.

## 2016-07-08 NOTE — Progress Notes (Signed)
Follow-up Outpatient Visit Date: 07/08/2016  Primary Care Provider: [name], MD [address] [place] [address]  Chief Complaint: Follow-up aortic valve replacement  HPI:  David Benton is a 83 y.o. year-old male with history of bicuspid aortic valve status post mechanical AVR in 1991 (St. Jude), thoracic aortic aneurysm followed by [name], carotid artery stenosis, hypertension, hyperlipidemia, and Parkinson's' disease, who presents for follow-up. He was previously followed in our office by [name] and was last seen by Robbie Lis, [place], on 01/14/16. At his last visit, David Benton was doing well except for balance issues. He was also noted to have a tremor at the time. Neurologic workup was consistent with Parkinson's disease. He is currently doing exercises to help with his balance, strength, and coordination. From a heart standpoint, he has continued to do well. He denies chest pain, shortness of breath, palpitations, lightheadedness, orthopnea, PND, and edema. He has not had any falls. He continues to bruise easily, remaining on warfarin. Since discontinuation of aspirin, he has not had any further hematomas (spontaneous forearm hematoma occurred in the setting of ASA and warfarin use). He is due to follow-up with [name] later this summer for surveillance of his TAA.  --------------------------------------------------------------------------------------------------  Past Medical History:  Diagnosis Date  . AVD (aortic valve disease) 02/25/2010   sees [name]   . External hemorrhoids without mention of complication   . Heart valve replaced by other means   . Long term (current) use of anticoagulants 09/15/2010  . Meniere's disease   . Other and unspecified hyperlipidemia   . Other constipation   . Personal history of colonic polyps   . Personal history of malignant neoplasm of prostate   . Shingles   . SHINGLES 11/26/2009  . Thoracic aneurysm without  mention of rupture    followed by [name] with yearly CT scans   . Unspecified essential hypertension    Past Surgical History:  Procedure Laterality Date  . AORTIC VALVE REPLACEMENT  1991    st. jude's valve, per [name]   . COLONOSCOPY  06-28-10   per Dr. Henrene Pastor, benign polyps, no further scopes needed   . HEMORRHOID SURGERY    . PROSTATECTOMY  1995   per Dr. Roni Bread   . TONSILLECTOMY      Recent CV Pertinent Labs: Lab Results  Component Value Date   CHOL 136 07/08/2016   HDL 49 07/08/2016   LDLCALC 63 07/08/2016   TRIG 120 07/08/2016   CHOLHDL 2.8 07/08/2016   CHOLHDL 3 05/19/2015   INR 2.7 06/22/2016   INR 1.96 (H) 03/13/2013   INR 2.9 03/10/2010   K 3.8 07/08/2016   MG 2.2 09/29/2008   BUN 26 07/08/2016   CREATININE 1.22 07/08/2016   CREATININE 1.09 07/30/2014    Past medical and surgical history were reviewed and updated in EPIC.  Outpatient Encounter Prescriptions as of 07/08/2016  Medication Sig  . acetaminophen (TYLENOL) 325 MG tablet Take 650 mg by mouth as needed. Reported on 04/21/2015  . amoxicillin (AMOXIL) 500 MG capsule TAKE FOUR CAPSULES BY MOUTH ONE HOUR PRIOR TO DENTAL APPOINTMENT AS DIRECTED.  Marland Kitchen atorvastatin (LIPITOR) 40 MG tablet TAKE 1 TABLET BY MOUTH EVERY DAY  . magnesium hydroxide (MILK OF MAGNESIA) 400 MG/5ML suspension Take 30 mLs by mouth daily as needed for indigestion. Reported on 04/21/2015  . Saline GEL Place into the nose daily as needed (as directed). Reported on 04/21/2015  . sodium chloride (OCEAN)  0.65 % SOLN nasal spray Place 1 spray into both nostrils as needed for congestion. Reported on 04/21/2015  . TRIAMTERENE-HCTZ PO Take by mouth. 37.5-25 mg take 1 daily.   Marland Kitchen warfarin (COUMADIN) 5 MG tablet Take as directed by [facility]mad[facility] Clinic   No facility-administered encounter medications on file as of 07/08/2016.     Allergies: Codeine  Social History   Social History  . Marital status: Married    Spouse name: N/A  . Number of  children: 0  . Years of education: 32   Occupational History  . 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 Smoking status: Former Smoker    Types: Cigarettes    Quit date: 02/06/1972  . Smokeless tobacco: Never Used  . Alcohol use 4.2 oz/week    7 Standard drinks or equivalent per week     Comment: glass of wine daily  . Drug use: No  . Sexual activity: No   Other Topics Concern  . Not on file   Social History Narrative   HSG, a couple of years of college. Married '78 - . No children. Work - [employer] 46 years - retired.   Lives alone with wife and two dogs. ACP - Yes CPR, Yes - short-term mechanical ventilation. Does not want prolonged heroic measures in the face of irreversible disease. HCPOA - wife. Alternative is his brother: [facility].     Family History  Problem Relation Age of Onset  . Heart attack Father 16       fatal at 23  . Hypertension Father   . CAD Father   . Other Father        possible PD as says had "palsy"  . Prostate cancer Brother   . Diabetes Brother   . Heart attack Mother   . Diabetes Brother   . Stroke Brother   . Heart disease Brother        carotid artery disease  . Hypertension Brother   . Breast cancer Sister        mastectomy at 64  . Colon cancer Neg Hx     Review of Systems: A 12-system review of systems was performed and was negative except as noted in the HPI.  --------------------------------------------------------------------------------------------------  Physical Exam: BP 140/68   Pulse 68   Ht 5\' 10"  (1.778 m)   Wt 148 lb 6.4 oz (67.3 kg)   BMI 21.29 kg/m   General:  Thin, elderly man, seated comfortably on the exam table. HEENT: No conjunctival pallor or scleral icterus.  Moist mucous membranes.  OP clear. Neck: Supple without lymphadenopathy, thyromegaly, JVD, or HJR. Lungs: Normal work of breathing.  Clear to auscultation bilaterally without wheezes or  crackles. Heart: Regular rate and rhythm without murmurs, rubs, or gallops. Mechanical S2 noted. Non-displaced PMI. Abd: Bowel sounds present.  Soft, NT/ND without hepatosplenomegaly Ext: No lower extremity edema.  Radial, PT, and DP pulses are 2+ bilaterally. Skin: warm and dry without rash  EKG:  NSR with 1st degree AV block, LAFB, poor R-wave progression, and non-specific ST segment changes. Compared with prior tracing from 06/08/16, PVC's are no longer present. Otherwise, there has been no significant change.  TTE (02/09/16): - Left ventricle: The cavity size was normal. Wall thickness was   increased in a pattern of mild LVH. There was severe focal basal   hypertrophy of the septum. Systolic function was normal. The  estimated ejection fraction was in the range of 60% to 65%. Wall   motion was normal; there were no regional wall motion   abnormalities. Doppler parameters are consistent with abnormal   left ventricular relaxation (grade 1 diastolic dysfunction). The   E/e&' ratio is between 8-15, suggesting indeterminate LV filling   pressure. - Aortic valve: Mechanical AVR - no obstruction. Normal tilting   bileaflet motion. Mean gradient (S): 7 mm Hg. Peak gradient (S):   17 mm Hg. - Aorta: Ascending aortic diameter: 45 mm (S). - Ascending aorta: The ascending aorta was mildly dilated. - Left atrium: Severe LAE. - Atrial septum: Aneurysmal IAS - cannot exclude a small PFO. - Tricuspid valve: There was mild regurgitation. - Pulmonary arteries: PA peak pressure: 37 mm Hg (S). - Inferior vena cava: The vessel was normal in size. The   respirophasic diameter changes were in the normal range (>= 50%),   consistent with normal central venous pressure.   Lab Results  Component Value Date   WBC 4.6 07/08/2016   HGB 13.8 07/08/2016   HCT 40.4 07/08/2016   MCV 88 07/08/2016   PLT 223 07/08/2016    Lab Results  Component Value Date   NA 142 07/08/2016   K 3.8 07/08/2016   CL  100 07/08/2016   CO2 28 07/08/2016   BUN 26 07/08/2016   CREATININE 1.22 07/08/2016   GLUCOSE 106 (H) 07/08/2016   ALT 21 07/08/2016    Lab Results  Component Value Date   CHOL 136 07/08/2016   HDL 49 07/08/2016   LDLCALC 63 07/08/2016   TRIG 120 07/08/2016   CHOLHDL 2.8 07/08/2016    --------------------------------------------------------------------------------------------------  ASSESSMENT AND PLAN: History of bicuspic aortic valve status post mechanical aortic valve replacement David Benton continues to do well without symptoms of heart failure or embolic events. He continues to have easy bruising but has not had any significant bleeding or recurrent hematomas since discontinuation of low-dose aspirin. We will continue with indefinite warfarin. Most recent echo in 01/2016 showed normal LV and AVR function.  Thoracic aortic aneurysm Ascending aorta stable in size by echo in 01/2016. Patient should continue annual follow-up with [name].  Carotid artery stenosis Carotid Doppler in March showed mild bilateral carotid stenosis, similar to 2017. David Benton has not had any new neurologic symptoms. We will continue with warfarin and atorvastatin. We will repeat Doppler next spring.  Hypertension Blood pressure is borderline elevated. We will not make any medication changes today. We will recheck renal function and electrolytes today.  Hyperlipidemia Goal LDL < 70; we will recheck lipid panel today and continue with atorvastatin.  Follow-up: Return to clinic in 6 months.  Nelva Bush, MD 07/10/2016 10:25 AM

## 2016-07-09 LAB — LIPID PANEL
Chol/HDL Ratio: 2.8 ratio (ref 0.0–5.0)
Cholesterol, Total: 136 mg/dL (ref 100–199)
HDL: 49 mg/dL (ref >39)
LDL Calculated: 63 mg/dL (ref 0–99)
Triglycerides: 120 mg/dL (ref 0–149)
VLDL CHOLESTEROL CAL: 24 mg/dL (ref 5–40)

## 2016-07-09 LAB — COMPREHENSIVE METABOLIC PANEL
A/G RATIO: 2.1 (ref 1.2–2.2)
ALT: 21 IU/L (ref 0–44)
AST: 25 IU/L (ref 0–40)
Albumin: 4.5 g/dL (ref 3.5–4.7)
Alkaline Phosphatase: 89 IU/L (ref 39–117)
BUN / CREAT RATIO: 21 (ref 10–24)
BUN: 26 mg/dL (ref 8–27)
Bilirubin Total: 1.7 mg/dL — ABNORMAL HIGH (ref 0.0–1.2)
CALCIUM: 9.5 mg/dL (ref 8.6–10.2)
CO2: 28 mmol/L (ref 20–29)
Chloride: 100 mmol/L (ref 96–106)
Creatinine, Ser: 1.22 mg/dL (ref 0.76–1.27)
GFR, EST AFRICAN AMERICAN: 63 mL/min/{1.73_m2} (ref >59)
GFR, EST NON AFRICAN AMERICAN: 54 mL/min/{1.73_m2} — AB (ref >59)
Globulin, Total: 2.1 g/dL (ref 1.5–4.5)
Glucose: 106 mg/dL — ABNORMAL HIGH (ref 65–99)
POTASSIUM: 3.8 mmol/L (ref 3.5–5.2)
Sodium: 142 mmol/L (ref 134–144)
Total Protein: 6.6 g/dL (ref 6.0–8.5)

## 2016-07-09 LAB — CBC WITH DIFFERENTIAL/PLATELET
BASOS: 1 %
Basophils Absolute: 0 10*3/uL (ref 0.0–0.2)
EOS (ABSOLUTE): 0.1 10*3/uL (ref 0.0–0.4)
Eos: 2 %
Hematocrit: 40.4 % (ref 37.5–51.0)
Hemoglobin: 13.8 g/dL (ref 13.0–17.7)
IMMATURE GRANS (ABS): 0 10*3/uL (ref 0.0–0.1)
Immature Granulocytes: 0 %
Lymphocytes Absolute: 0.8 10*3/uL (ref 0.7–3.1)
Lymphs: 18 %
MCH: 30.2 pg (ref 26.6–33.0)
MCHC: 34.2 g/dL (ref 31.5–35.7)
MCV: 88 fL (ref 79–97)
MONOS ABS: 0.5 10*3/uL (ref 0.1–0.9)
Monocytes: 11 %
NEUTROS ABS: 3.2 10*3/uL (ref 1.4–7.0)
Neutrophils: 68 %
PLATELETS: 223 10*3/uL (ref 150–379)
RBC: 4.57 x10E6/uL (ref 4.14–5.80)
RDW: 13.8 % (ref 12.3–15.4)
WBC: 4.6 10*3/uL (ref 3.4–10.8)

## 2016-07-10 DIAGNOSIS — I6523 Occlusion and stenosis of bilateral carotid arteries: Secondary | ICD-10-CM | POA: Insufficient documentation

## 2016-07-10 HISTORY — DX: Occlusion and stenosis of bilateral carotid arteries: I65.23

## 2016-07-11 ENCOUNTER — Telehealth: Payer: Self-pay | Admitting: Internal Medicine

## 2016-07-11 NOTE — Telephone Encounter (Signed)
New message    Pt is calling back for [facility].

## 2016-07-11 NOTE — Telephone Encounter (Signed)
Informed pt of lab results. Pt verbalized understanding. 

## 2016-07-19 ENCOUNTER — Other Ambulatory Visit: Payer: Self-pay | Admitting: Cardiology

## 2016-07-19 NOTE — Telephone Encounter (Signed)
Followed by Dr End 

## 2016-08-02 ENCOUNTER — Other Ambulatory Visit: Payer: Self-pay | Admitting: Cardiothoracic Surgery

## 2016-08-02 DIAGNOSIS — I712 Thoracic aortic aneurysm, without rupture, unspecified: Secondary | ICD-10-CM

## 2016-08-03 ENCOUNTER — Ambulatory Visit (INDEPENDENT_AMBULATORY_CARE_PROVIDER_SITE_OTHER): Payer: Medicare Other | Admitting: *Deleted

## 2016-08-03 DIAGNOSIS — Z7901 Long term (current) use of anticoagulants: Secondary | ICD-10-CM | POA: Diagnosis not present

## 2016-08-03 DIAGNOSIS — Z952 Presence of prosthetic heart valve: Secondary | ICD-10-CM

## 2016-08-03 DIAGNOSIS — Z5181 Encounter for therapeutic drug level monitoring: Secondary | ICD-10-CM

## 2016-08-03 LAB — POCT INR: INR: 2.7

## 2016-08-09 ENCOUNTER — Ambulatory Visit (INDEPENDENT_AMBULATORY_CARE_PROVIDER_SITE_OTHER): Payer: Medicare Other | Admitting: Family Medicine

## 2016-08-09 ENCOUNTER — Encounter: Payer: Self-pay | Admitting: Family Medicine

## 2016-08-09 VITALS — BP 120/73 | HR 70 | Temp 98.1°F | Ht 70.0 in | Wt 147.0 lb

## 2016-08-09 DIAGNOSIS — K4090 Unilateral inguinal hernia, without obstruction or gangrene, not specified as recurrent: Secondary | ICD-10-CM | POA: Diagnosis not present

## 2016-08-09 NOTE — Progress Notes (Signed)
   Subjective:    Patient ID: TIP ATIENZA, male    DOB: 1929/07/19, 83 y.o.   MRN: [id number]  HPI Here for 6 weeks of intermittent pain in the right groin area. No recent trauma but he has been active with riding a stationary bike and with some low level boxing classes. No trouble with bowels or urinations.    Review of Systems  Constitutional: Negative.   Respiratory: Negative.   Cardiovascular: Negative.   Gastrointestinal: Positive for abdominal pain. Negative for abdominal distention, anal bleeding, blood in stool, constipation, diarrhea, nausea, rectal pain and vomiting.  Genitourinary: Negative.        Objective:   Physical Exam  Constitutional: He appears well-developed and well-nourished.  Cardiovascular: Normal rate, regular rhythm, normal heart sounds and intact distal pulses.   Pulmonary/Chest: Effort normal and breath sounds normal.  Abdominal: Soft. Bowel sounds are normal. He exhibits no distension. There is no rebound and no guarding.  There is a small tender but reducible right direct inguinal hernia           Assessment & Plan:  Inguinal hernia. We will refer him to Surgery to evaluate.  Alysia Penna, MD

## 2016-08-09 NOTE — Patient Instructions (Signed)
WE NOW OFFER   [facility] FAST TRACK!!!  SAME DAY Appointments for ACUTE CARE  Such as: Sprains, Injuries, cuts, abrasions, rashes, muscle pain, joint pain, back pain Colds, flu, sore throats, headache, allergies, cough, fever  Ear pain, sinus and eye infections Abdominal pain, nausea, vomiting, diarrhea, upset stomach Animal/insect bites  3 Easy Ways to Schedule: Walk-In Scheduling Call in scheduling Mychart Sign-up: [email or web address]         

## 2016-08-25 ENCOUNTER — Other Ambulatory Visit: Payer: Self-pay | Admitting: General Surgery

## 2016-08-25 DIAGNOSIS — Z7901 Long term (current) use of anticoagulants: Secondary | ICD-10-CM | POA: Diagnosis not present

## 2016-08-25 DIAGNOSIS — Z952 Presence of prosthetic heart valve: Secondary | ICD-10-CM | POA: Diagnosis not present

## 2016-08-25 DIAGNOSIS — K4090 Unilateral inguinal hernia, without obstruction or gangrene, not specified as recurrent: Secondary | ICD-10-CM | POA: Diagnosis not present

## 2016-08-25 DIAGNOSIS — G20 Parkinson's disease: Secondary | ICD-10-CM | POA: Diagnosis not present

## 2016-08-25 DIAGNOSIS — Z5181 Encounter for therapeutic drug level monitoring: Secondary | ICD-10-CM | POA: Diagnosis not present

## 2016-08-31 NOTE — Progress Notes (Signed)
[address]       ,[place] [address]             [phone]               David Benton [place] Medical Record #[id number] Date of Birth: August 02, 1929  Referring:  Dr Einar Crow Primary Care: [name], MD  Chief Complaint:    Chief Complaint  Patient presents with  . Thoracic Aortic Aneurysm    1 year f/u with CTA Chest    History of Present Illness:    Patient returns to the office today in followup after aortic valve replacement with a 29 mm St. Jude mechanical valve (29A-101  SN [id number]) in August of 1991 by Dr Redmond Pulling for aortic stenosis and bicuspid aortic valve. He was seen in April of 2009 because of aortic dilation on echocardiogram.  Since that time he's had serial CT scans to evaluate the size of his aorta . At age 83 he remains active exercising daily  He continues on Coumadin without evidence of significant bleeding complication . As August the patient is now 83 years following his aortic valve replacement.  Since last seen the patient has been diagnosed with Parkinson's, with noticeable tremor in his left hand, so far he has been treating this without medication. He's also recently developed a symptomatic right inguinal hernia in his plan on having this repaired in the near future. Patient returns today with a follow-up CT scan to evaluate the size of his aorta.  Current Activity/ Functional Status: Patient is  independent with mobility/ambulation, transfers, ADL's, IADL's.   Past Medical History:  Diagnosis Date  . AVD (aortic valve disease) 02/25/2010   sees [name]   . External hemorrhoids without mention of complication   . Heart valve replaced by other means   . Long term (current) use of anticoagulants 09/15/2010  . Meniere's disease   . Other and unspecified hyperlipidemia   . Other constipation   . Personal history of colonic polyps   . Personal history of malignant neoplasm of prostate   . Shingles   . SHINGLES  11/26/2009  . Thoracic aneurysm without mention of rupture    followed by [name] with yearly CT scans   . Unspecified essential hypertension     Past Surgical History:  Procedure Laterality Date  . AORTIC VALVE REPLACEMENT  1991    st. jude's valve, per [name]   . COLONOSCOPY  06-28-10   per Dr. Henrene Pastor, benign polyps, no further scopes needed   . HEMORRHOID SURGERY    . PROSTATECTOMY  1995   per Dr. Roni Bread   . TONSILLECTOMY      Family History  Problem Relation Age of Onset  . Heart attack Father 4       fatal at 1  . Hypertension Father   . CAD Father   . Other Father        possible PD as says had "palsy"  . Prostate cancer Brother   . Diabetes Brother   . Heart attack Mother   . Diabetes Brother   . Stroke Brother   . Heart disease Brother        carotid artery disease  . Hypertension Brother   . Breast cancer Sister        mastectomy at 72  . Colon cancer Neg Hx     Social History   Social History  . Marital  status: Married    Spouse name: N/A  . Number of children: 0  . Years of education: 49   Occupational History  . retired     [employer] film and Engineer, production   Social History Main Topics  . Smoking status: Former Smoker    Types: Cigarettes    Quit date: 02/06/1972  . Smokeless tobacco: Never Used  . Alcohol use 4.2 oz/week    7 Standard drinks or equivalent per week     Comment: glass of wine daily  . Drug use: No  . Sexual activity: No   Other Topics Concern  . Not on file   Social History Narrative   HSG, a couple of years of college. Married '78 - . No children. 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   Lives alone with wife and two dogs. ACP - Yes CPR, Yes - short-term mechanical ventilation. Does not want prolonged heroic measures in the face of irreversible disease. HCPOA - wife. Alternative is his brother: Denys Salinger [facility].     History  Smoking Status  . Former Smoker  . Types: Cigarettes  . Quit date: 02/06/1972    Smokeless Tobacco  . Never Used    History  Alcohol Use  . 4.2 oz/week  . 7 Standard drinks or equivalent per week    Comment: glass of wine daily     Allergies  Allergen Reactions  . Codeine     REACTION: N/V    Current Outpatient Prescriptions  Medication Sig Dispense Refill  . acetaminophen (TYLENOL) 325 MG tablet Take 650 mg by mouth as needed. Reported on 04/21/2015    . amoxicillin (AMOXIL) 500 MG capsule TAKE FOUR CAPSULES BY MOUTH ONE HOUR PRIOR TO DENTAL APPOINTMENT AS DIRECTED. 12 capsule 3  . atorvastatin (LIPITOR) 40 MG tablet Take 1 tablet (40 mg total) by mouth daily. 30 tablet 5  . magnesium hydroxide (MILK OF MAGNESIA) 400 MG/5ML suspension Take 30 mLs by mouth daily as needed for indigestion. Reported on 04/21/2015    . Saline GEL Place into the nose daily as needed (as directed). Reported on 04/21/2015    . sodium chloride (OCEAN) 0.65 % SOLN nasal spray Place 1 spray into both nostrils as needed for congestion. Reported on 04/21/2015    . TRIAMTERENE-HCTZ PO Take by mouth. 37.5-25 mg take 1 daily.     Marland Kitchen warfarin (COUMADIN) 5 MG tablet Take as directed by [facility] 50 tablet 3   No current facility-administered medications for this visit.        Review of Systems:     Cardiac Review of Systems: Y or N  Chest Pain [ N ]  Resting SOB Aqua.Slicker  ] Exertional SOB  [  N]  Orthopnea Aqua.Slicker  ]   Pedal Edema [ N ]    Palpitations Aqua.Slicker  ] Syncope  Aqua.Slicker  ]   Presyncope Aqua.Slicker  ]  General Review of Systems: [Y] = yes [  ]=no Constitional: recent weight change [  ]; anorexia [ N ]; fatigue [  ]; nausea [  ]; night sweats [  ]; fever Aqua.Slicker  ]; or chills [  ];  Dental: poor dentition[ N ]; Last Dentist visit:FALL 2013   Eye : blurred vision Aqua.Slicker  ]; diplopia [   ]; vision changes [  ];  Amaurosis fugax[  ]; Resp: cough [ N ];  wheezing[N  ];  hemoptysis[N  ];  shortness of breath[ N ]; paroxysmal nocturnal dyspnea[  ]; dyspnea on exertion[  ]; or orthopnea[  ];  GI:  gallstones[  ], vomiting[  ];  dysphagia[  ]; melena[  ];  hematochezia [  ]; heartburn[  ];   Hx of  Colonoscopy[ NOT NEEDED ANY MORE ]; GU: kidney stones [  ]; hematuria[  ];   dysuria [  ];  nocturia[  ];  history of     obstruction [  ];             Skin: rash, swelling[  ];, hair loss[  ];  peripheral edema[  ];  or itching[  ]; Musculosketetal: myalgias[ N ];  joint swelling[N  ];  joint erythema[  ];  joint pain[  ];  back pain[ N ];  Heme/Lymph: bruising[  ];  bleeding[  ];  anemia[  ];  Neuro: TIA[ N ];  headaches[  ];  stroke[ N ];  vertigo[  ];  seizures[  ];   paresthesias[  ];  difficulty walking[  ];tremour left hand  Psych:depression[ N ]; anxiety[ N ];  Endocrine: diabetes[  ];  thyroid dysfunction[  ];  Immunizations: Flu [ y  ]; Pneumococcal[ y IN [place]  ];  Other:  Physical Exam: BP 127/69   Pulse 66   Resp 18   Ht 5\' 10"  (1.778 m)   Wt 147 lb 9.6 oz (67 kg)   SpO2 98% Comment: RA  BMI 21.18 kg/m  Physical Exam  Constitutional: No distress.  HENT:  Head: Normocephalic and atraumatic.  Mouth/Throat: No oropharyngeal exudate.  Eyes: Right eye exhibits no discharge. Left eye exhibits no discharge. No scleral icterus.  Neck: No JVD present. No tracheal deviation present. No thyromegaly present.  Cardiovascular: Exam reveals no gallop and no friction rub.   Murmur heard. Respiratory: Effort normal and breath sounds normal. No stridor. He has no wheezes.  GI: Soft. Bowel sounds are normal.  Lymphadenopathy:    He has no cervical adenopathy.  Neurological: He is alert.  Skin: Skin is warm and dry. He is not diaphoretic.  Psychiatric: He has a normal mood and affect. His behavior is normal. Judgment and thought content normal.   mild tremor left hand Murmur with valve click of mechanical valve  Diagnostic Studies & Laboratory data:     Recent Radiology  Findings:  Ct Angio Chest Aorta W/cm &/or Wo/cm  Result Date: 09/01/2016 CLINICAL DATA:  Followup aneurysm. EXAM: CT ANGIOGRAPHY CHEST WITH CONTRAST TECHNIQUE: Multidetector CT imaging of the chest was performed using the standard protocol during bolus administration of intravenous contrast. Multiplanar CT image reconstructions and MIPs were obtained to evaluate the vascular anatomy. CONTRAST:  75 cc of Isovue 370 Creatinine was obtained on site at [facility] at [address] Results: Creatinine 1.1 mg/dL. COMPARISON:  08/20/2015 FINDINGS: Cardiovascular: Moderate cardiac enlargement. Aortic atherosclerosis. Measured at the same level as previously the ascending thoracic aorta measures 4.3 by 4.4 cm, image 65 of series 4. Unchanged from previous exam. The anterior arch measures 3.2 cm, image 41 of series 4. Also unchanged. The posterior arch measures 3.3 cm, image 41 of series 4. Stable from previous exam. The descending thoracic  aorta is stable measuring 3.1 cm. Previous CABG procedure. Mediastinum/Nodes: No enlarged mediastinal, hilar, or axillary lymph nodes. Thyroid gland, trachea, and esophagus demonstrate no significant findings. Lungs/Pleura: No pleural effusion. Mild diffuse bronchial wall thickening noted. No airspace opacities identified. Upper Abdomen: No acute abnormality. Musculoskeletal: Mild scoliosis and degenerative disc disease identified. Review of the MIP images confirms the above findings. IMPRESSION: 1. Stable thoracic Aortic aneurysm NOS (ICD10-I71.9) measuring 4.4 cm. Recommend annual imaging followup by CTA or MRA. This recommendation follows 2010 ACCF/AHA/AATS/ACR/ASA/SCA/SCAI/SIR/STS/SVM Guidelines for the Diagnosis and Management of Patients with Thoracic Aortic Disease. Circulation. 2010; 121: D322-G254 2.  Aortic Atherosclerosis (ICD10-I70.0). Electronically Signed   By: Kerby Moors M.D.   On: 09/01/2016 11:11   I have independently reviewed the above radiology  studies  and reviewed the findings with the patient.    Ct Angio Chest Aorta W/cm &/or Wo/cm  08/01/2013   CLINICAL DATA:  Thoracic aortic aneurysmal disease with history of aortic valve replacement.  EXAM: CT ANGIOGRAPHY CHEST WITH CONTRAST  TECHNIQUE: Multidetector CT imaging of the chest was performed using the standard protocol during bolus administration of intravenous contrast. Multiplanar CT image reconstructions and MIPs were obtained to evaluate the vascular anatomy.  CONTRAST:  57mL OMNIPAQUE IOHEXOL 350 MG/ML SOLN  COMPARISON:  02/16/2012 and other prior CTA studies.  FINDINGS: Measured maximal diameter of the ascending thoracic aorta is 4.3 cm. The 4.6 cm measurement previously may have been slightly overestimated. There is no evidence of aortic dissection or intramural hemorrhage. The proximal arch measures 3.3 cm. The distal arch measures 3.2 cm. The descending thoracic aorta measures 3.2 cm. Stable mild atherosclerotic plaque involving the arch and proximal great vessels without evidence of ulcerated plaque or penetrating ulcer. Proximal great vessels showed no significant stenosis.  The heart size appears stable. There is no evidence of pleural or pericardial fluid. Lungs show no evidence of edema, consolidation or nodule. No enlarged lymph nodes are identified. Stable calcified plaque in the distribution of the LAD and left circumflex coronary arteries. Visualized upper abdomen is unremarkable. Bony structures are unremarkable.  Review of the MIP images confirms the above findings.  IMPRESSION: No further enlargement of the ascending thoracic aorta. Current maximal diameter measurement is 4.3 cm by CTA and appears to be stable compared to the 06/2010 study. Previous measurement of 4.6 cm may have been slightly overestimated based on axis of measurement.   Electronically Signed   By: Aletta Edouard M.D.   On: 08/01/2013 13:36    Ct Angio Chest Aorta W/cm &/or Wo/cm  08/14/2012   *RADIOLOGY  REPORT*  Clinical Data:  22-month follow-up thoracic aortic aneurysm and liver lesion.  CT ANGIOGRAPHY CHEST  Technique:  Multidetector CT imaging of the chest using the standard protocol during bolus administration of intravenous contrast. Multiplanar reconstructed images including MIPs were obtained and reviewed to evaluate the vascular anatomy.  Contrast: 50mL OMNIPAQUE IOHEXOL 350 MG/ML SOLN  Comparison: Prior CTA of the chest 02/16/2012  Findings:  Mediastinum: Unremarkable CT appearance of the thyroid gland.  No suspicious mediastinal or hilar adenopathy.  No soft tissue mediastinal mass.  The thoracic esophagus is unremarkable.  Heart/Vascular: Surgical changes of mechanical aortic valve repair. There is persistent dilatation of the aortic root up to 4.3 cm at the sinuses of Valsalva.  This is not significantly changed compared to prior.  The aorta measures approximately 4.2 cm at the sinotubular junction.  The ascending aorta is aneurysmally dilated with a maximal diameter of 4.6 cm, unchanged  compared to prior. The transverse aorta measures 3.4 cm.  The proximal descending thoracic aorta measures 3.6 cm.  Scattered atherosclerotic vascular calcifications.  Extensive atherosclerotic calcifications noted throughout the coronary arteries. Mild ectasia of the right subclavian artery to 1.6 cm. Stable borderline cardiomegaly.  Lungs/Pleura: Trace biapical pleural parenchymal scarring.  Trace dependent atelectasis.  The lungs are otherwise clear.  Upper Abdomen: Stable 6 - 7 mm hypoattenuating lesion in the posterior aspect of hepatic segments six.  No significant interval growth.  Sludge and/or small stones layer within the gallbladder lumen.  Bilateral renal cortical thinning with scattered areas of scarring.  Otherwise, unremarkable upper abdomen.  Bones: No acute fracture or aggressive appearing lytic or blastic osseous lesion.  T12 hemangioma.  Healed median sternotomy.  IMPRESSION:  1.  Stable ascending  aortic aneurysm with maximal diameter of the ascending tubular segment at 4.6 cm and  maximal diameter of the sinotubular junction 4.3 cm.  2.  Stable sub centimeter hypoattenuating lesion in the posterior right hepatic lobe.  In retrospect, this finding was likely present in 2012 (although it cannot be definitively identified on studies from 2010 or 2009) and is very likely benign. Recommend continued attention on routine follow-up imaging.  3.  Sludge and / or small stones in the gallbladder lumen.  4.  Atherosclerosis including multivessel coronary artery disease.  Signed,  Criselda Peaches, MD Vascular & Interventional Radiologist [facility]   Original Report Authenticated By: Jacqulynn Cadet, M.D.     Ct Angio Chest W/cm &/or Wo Cm  02/16/2012  *RADIOLOGY REPORT*  Clinical Data: Thoracic aortic aneurysm  CT ANGIOGRAPHY CHEST  Technique:  Multidetector CT imaging of the chest using the standard protocol during bolus administration of intravenous contrast. Multiplanar reconstructed images including MIPs were obtained and reviewed to evaluate the vascular anatomy.  Contrast: 122mL OMNIPAQUE IOHEXOL 350 MG/ML SOLN  Comparison: 06/24/2010  Findings: Aneurysmal dilatation of the ascending aorta has slightly increased.  Diameters at the sinus of Valsalva, sinotubular junction, and ascending aorta are 4.4 cm, 4.3 cm, and 4.6 cm. Previously, maximal diameter in the sinus of Valsalva was 4.3 cm while the ascending aorta was 4.2 cm.  Aortic valve replacement hardware remains stable in position.  Mitral valve calcifications are noted coronary artery calcifications in the left main, left anterior descending, and circumflex arteries are noted.  Mild atherosclerotic changes at the origin of the left common carotid artery and left subclavian artery are present without significant narrowing.  Innominate artery and right subclavian artery are ectatic measuring 1.8 and 1.8 cm in caliber respectively.  Subclavian  arteries are patent.  Visualized common carotid arteries are patent.  Vertebral arteries are patent.  Left vertebral is diminutive.  No obvious filling defect in the pulmonary arterial tree to suggest acute pulmonary thromboembolism.  No evidence of aortic transection or dissection.  Limited images of the abdomen demonstrate mild narrowing at the origin of the celiac axis with the appearance of median arcuate ligament syndrome.  SMA is patent.  Branch vessels of the celiac axis are grossly patent.  Atherosclerotic changes at the origin of both renal arteries is present.  Degree of narrowing is difficult to determine secondary to calcifications.  No evidence of abnormal mediastinal, hilar, or axillary adenopathy. No pericardial effusion.  No pneumothorax.  No pleural effusion.  Diffuse centrilobular emphysema is not significantly changed.  No new mass or consolidation.  Stable parenchymal scarring at the lung apices.  Bilateral renal scarring.  Adrenal glands are within normal  limits. New sub centimeter low density lesion in the right lobe of the liver has developed on image 131.  No destructive bone lesion.  T12 angioma is noted.  IMPRESSION: Aneurysmal dilatation has slightly increased.  Maximal diameter of the ascending aorta is 4.6 cm and was previously 4.2 cm.  Maximal diameter at the sinus of Valsalva is 4.4 cm and was previously 4.3 cm.  New sub centimeter liver lesion in the right lobe has developed. It is benign appearing but because it is new, follow-up CT or MRI of the liver in 6 months is recommended to ensure stability.  It may have been obscured on prior studies secondary to phase of liver enhancement.   Original Report Authenticated By: Marybelle Killings, M.D.       Recent Lab Findings: Lab Results  Component Value Date   WBC 4.6 07/08/2016   HGB 13.8 07/08/2016   HCT 40.4 07/08/2016   PLT 223 07/08/2016   GLUCOSE 106 (H) 07/08/2016   CHOL 136 07/08/2016   TRIG 120 07/08/2016   HDL 49  07/08/2016   LDLCALC 63 07/08/2016   ALT 21 07/08/2016   AST 25 07/08/2016   NA 142 07/08/2016   K 3.8 07/08/2016   CL 100 07/08/2016   CREATININE 1.22 07/08/2016   BUN 26 07/08/2016   CO2 28 07/08/2016   TSH 3.31 05/19/2015   INR 2.7 08/03/2016   Aortic Size Index=     4.6    /Body surface area is 1.82 meters squared. =  2.51  < 2.75 cm/m2      4% risk per year 2.75 to 4.25          8% risk per year > 4.25 cm/m2    20% risk per year  cross sectional area of aorta cm2/height in meters  16.6/1.77= 9.3   Assessment / Plan:    1 status post replacement of aortic valve with 29 mm mechanical St. Jude . In 1991  Aneurysmal dilatation of the ascending thoracic aorta Stable thoracic Aortic aneurysm-No operative intervention is recommended  He continues to be diligent about his dental care and is aware of endocarditis precautions    Grace Isaac MD  Beeper [phone] Office [phone] 09/01/2016 11:44 AM

## 2016-09-01 ENCOUNTER — Ambulatory Visit
Admission: RE | Admit: 2016-09-01 | Discharge: 2016-09-01 | Disposition: A | Discharge status: Home / Self Care | Payer: Medicare Other | Source: Ambulatory Visit | Attending: Cardiothoracic Surgery | Admitting: Cardiothoracic Surgery

## 2016-09-01 ENCOUNTER — Ambulatory Visit (INDEPENDENT_AMBULATORY_CARE_PROVIDER_SITE_OTHER): Payer: Medicare Other | Admitting: Cardiothoracic Surgery

## 2016-09-01 ENCOUNTER — Encounter: Payer: Self-pay | Admitting: Cardiothoracic Surgery

## 2016-09-01 VITALS — BP 127/69 | HR 66 | Resp 18 | Ht 70.0 in | Wt 147.6 lb

## 2016-09-01 DIAGNOSIS — I712 Thoracic aortic aneurysm, without rupture, unspecified: Secondary | ICD-10-CM

## 2016-09-01 DIAGNOSIS — I700 Atherosclerosis of aorta: Secondary | ICD-10-CM | POA: Diagnosis not present

## 2016-09-01 DIAGNOSIS — I719 Aortic aneurysm of unspecified site, without rupture: Secondary | ICD-10-CM | POA: Diagnosis not present

## 2016-09-01 MED ORDER — IOPAMIDOL (ISOVUE-370) INJECTION 76%
75.0000 mL | Freq: Once | INTRAVENOUS | Status: AC | PRN
  Administered 2016-09-01: 75 mL via INTRAVENOUS

## 2016-09-06 ENCOUNTER — Encounter: Payer: Self-pay | Admitting: Family Medicine

## 2016-09-06 ENCOUNTER — Ambulatory Visit (INDEPENDENT_AMBULATORY_CARE_PROVIDER_SITE_OTHER): Payer: Medicare Other | Admitting: Family Medicine

## 2016-09-06 VITALS — BP 122/78 | HR 68 | Temp 98.4°F | Ht 70.0 in | Wt 147.0 lb

## 2016-09-06 DIAGNOSIS — L309 Dermatitis, unspecified: Secondary | ICD-10-CM | POA: Diagnosis not present

## 2016-09-06 MED ORDER — TRIAMCINOLONE ACETONIDE 0.1 % EX CREA: 1.0000 "application " | TOPICAL_CREAM | Freq: Two times a day (BID) | CUTANEOUS | 2 refills | Status: DC

## 2016-09-06 NOTE — Patient Instructions (Signed)
WE NOW OFFER   [facility] FAST TRACK!!!  SAME DAY Appointments for ACUTE CARE  Such as: Sprains, Injuries, cuts, abrasions, rashes, muscle pain, joint pain, back pain Colds, flu, sore throats, headache, allergies, cough, fever  Ear pain, sinus and eye infections Abdominal pain, nausea, vomiting, diarrhea, upset stomach Animal/insect bites  3 Easy Ways to Schedule: Walk-In Scheduling Call in scheduling Mychart Sign-up: [email or web address]         

## 2016-09-06 NOTE — Progress Notes (Signed)
   Subjective:    Patient ID: David Benton, male    DOB: 07-26-1929, 82 y.o.   MRN: [id number]  HPI Here for 2 weeks of a red itchy rash on the right arm. He has tried some antifungal spray with no effect.    Review of Systems  Constitutional: Negative.   Respiratory: Negative.   Cardiovascular: Negative.   Skin: Positive for rash.       Objective:   Physical Exam  Constitutional: He appears well-developed and well-nourished.  Cardiovascular: Normal rate, regular rhythm, normal heart sounds and intact distal pulses.   Pulmonary/Chest: Effort normal and breath sounds normal. No respiratory distress. He has no wheezes. He has no rales.  Skin:  Macular red rash on the right dorsal forearm and dorsal hand           Assessment & Plan:  Eczema. Try Triamcinolone cream.  Alysia Penna, MD

## 2016-09-14 ENCOUNTER — Ambulatory Visit (INDEPENDENT_AMBULATORY_CARE_PROVIDER_SITE_OTHER): Payer: Medicare Other | Admitting: *Deleted

## 2016-09-14 DIAGNOSIS — Z7901 Long term (current) use of anticoagulants: Secondary | ICD-10-CM

## 2016-09-14 DIAGNOSIS — Z5181 Encounter for therapeutic drug level monitoring: Secondary | ICD-10-CM

## 2016-09-14 DIAGNOSIS — Z952 Presence of prosthetic heart valve: Secondary | ICD-10-CM | POA: Diagnosis not present

## 2016-09-14 LAB — POCT INR: INR: 3.4

## 2016-09-26 ENCOUNTER — Ambulatory Visit (INDEPENDENT_AMBULATORY_CARE_PROVIDER_SITE_OTHER): Payer: Medicare Other | Admitting: Family Medicine

## 2016-09-26 ENCOUNTER — Encounter: Payer: Self-pay | Admitting: Family Medicine

## 2016-09-26 VITALS — BP 120/78 | HR 70 | Temp 97.9°F | Ht 70.0 in | Wt 148.0 lb

## 2016-09-26 DIAGNOSIS — R21 Rash and other nonspecific skin eruption: Secondary | ICD-10-CM

## 2016-09-26 NOTE — Progress Notes (Signed)
   Subjective:    Patient ID: David Benton, male    DOB: 03-09-1929, 83 y.o.   MRN: [id number]  HPI Here to recheck a rash on the right arm that started about 6 weeks ago. He tried an OTC antifungal cream with no improvement. Then we gave him some Triamcinolone cream to try, and this has not helped either. The rash itches or burns at times. It has not spread any further.    Review of Systems  Constitutional: Negative.   Respiratory: Negative.   Cardiovascular: Negative.   Skin: Positive for rash.       Objective:   Physical Exam  Constitutional: He appears well-developed and well-nourished.  Cardiovascular: Normal rate, regular rhythm, normal heart sounds and intact distal pulses.   Pulmonary/Chest: Effort normal and breath sounds normal. No respiratory distress. He has no wheezes. He has no rales.  Skin:  The right forearm has a area about 8 cm in diameter of red, scaly, maculopapular rash           Assessment & Plan:  The etiology of this rash is not clear. It does not appear to be fungal and it has not responded to a steroid cream the way we would expect eczema to respond. We will refer him to Dermatology for further evaluation. Alysia Penna, MD

## 2016-09-26 NOTE — Patient Instructions (Signed)
WE NOW OFFER   [facility] FAST TRACK!!!  SAME DAY Appointments for ACUTE CARE  Such as: Sprains, Injuries, cuts, abrasions, rashes, muscle pain, joint pain, back pain Colds, flu, sore throats, headache, allergies, cough, fever  Ear pain, sinus and eye infections Abdominal pain, nausea, vomiting, diarrhea, upset stomach Animal/insect bites  3 Easy Ways to Schedule: Walk-In Scheduling Call in scheduling Mychart Sign-up: [email or web address]         

## 2016-09-29 DIAGNOSIS — B354 Tinea corporis: Secondary | ICD-10-CM | POA: Diagnosis not present

## 2016-09-29 DIAGNOSIS — X32XXXA Exposure to sunlight, initial encounter: Secondary | ICD-10-CM | POA: Diagnosis not present

## 2016-09-29 DIAGNOSIS — L570 Actinic keratosis: Secondary | ICD-10-CM | POA: Diagnosis not present

## 2016-09-29 DIAGNOSIS — B355 Tinea imbricata: Secondary | ICD-10-CM | POA: Diagnosis not present

## 2016-10-06 ENCOUNTER — Encounter: Payer: Self-pay | Admitting: Family Medicine

## 2016-10-10 ENCOUNTER — Encounter: Payer: Self-pay | Admitting: Family Medicine

## 2016-10-11 DIAGNOSIS — H2513 Age-related nuclear cataract, bilateral: Secondary | ICD-10-CM | POA: Diagnosis not present

## 2016-10-18 ENCOUNTER — Telehealth: Payer: Self-pay | Admitting: Internal Medicine

## 2016-10-18 NOTE — Telephone Encounter (Signed)
Please clarify what was requested on 08/25/16. I have not seen the patient since June and do not recall seeing any clearance forms or other documentation regarding upcoming surgery.  Nelva Bush, MD [facility] Pager: [phone]

## 2016-10-18 NOTE — Telephone Encounter (Signed)
Pt will need cardiac clearance by MD since he will be undergoing general anesthesia.   Pt takes warfarin for hx of mechanical AVR. No other risk factors or afib noted. Typical INR range would be 2-3 however pt's has been 2.5-3.5 since we have been managing for at least the last 5 years. Will defer need for Lovenox bridge/INR range to Dr End as well. It does not look like pt has used Lovenox before.

## 2016-10-18 NOTE — Telephone Encounter (Signed)
Follow up     Fax [phone]    Cardiac Clearance for 08/25/16  Was never addressed , please fax back [facility] Pre-operative Risk Assessment    Request for surgical clearance:  1. What type of surgery is being performed? Open repair with mesh  Rt hernia    2. When is this surgery scheduled?  Not scheduled  3. Are there any medications that need to be held prior to surgery and how long? warafrin 5 days, possible lovenox bridge  4. Name of physician performing surgery?  Dr Dalbert Batman   5. What is your office phone and fax number? Fax [phone]  6. Anesthesia type (None, local, MAC, general) ? general   David Benton 10/18/2016, 11:11 AM  _________________________________________________________________   (provider comments below)

## 2016-10-19 NOTE — Telephone Encounter (Signed)
Follow up    [place] is calling from [facility] to follow up on the clearance. Please call.

## 2016-10-19 NOTE — Telephone Encounter (Signed)
I spoke with [name] at Dr Darrel Hoover office--according to their notes a request for cardiac clearance for right inguinal hernia repair was faxed to Dr End 08/25/16. I explained that I do not recall receiving that request, that Dr End  Typically is timely responding to cardiac clearance requests, apologized for the delay, and would  get back to her later today or tomorrow to let her know status of request. She understood and thanked me.  I will forward to Dr End for review.

## 2016-10-20 NOTE — Telephone Encounter (Signed)
I think it is fine for Mr. Guitron to proceed with inguinal hernia surgery without additional cardiovascular testing/intervention. I will defer management of anticoagulation to our Coumadin clinic. Given that he has a St. Jude mechanical valve, preserved LVEF, and no history of stroke or atrial fibrillation, I think it is fine to hold warfarin without Lovenox bridge. Thanks.  Nelva Bush, MD [facility] Pager: [phone]

## 2016-10-20 NOTE — Telephone Encounter (Signed)
Pt states nothing has changed and he has not had any new cardiac symptoms, including chest pain, shortness of breath, dizziness,  since last office visit with Dr End in June 2018.  Pt advised I will forward to Dr End for review, I will follow-up with him and Dr Darrel Hoover office once I have  clearance from Dr End.   Dr Darrel Hoover office aware I have spoken with patient and should have clearance from Dr End tomorrow.   Dr Darrel Hoover office is asking if pt needs to be pre-medicated with antibiotics prior to surgery.

## 2016-10-20 NOTE — Telephone Encounter (Signed)
LM Triage voice mail Dr Darrel Hoover office should have clearance completed tomorrow.

## 2016-10-21 NOTE — Telephone Encounter (Signed)
David Benton aware Dr End has cleared pt for hernia repair, CVRR has recommended pt hold warfarin for 5 days prior to surgery.

## 2016-10-21 NOTE — Telephone Encounter (Signed)
I will fax this information to Dr Earnestine Mealing

## 2016-10-21 NOTE — Telephone Encounter (Signed)
Agree pt ok to hold warfarin for 5 days without Lovenox bridge. Will keep INR range 2.5-3.5 per discussion with Dr End.

## 2016-10-21 NOTE — Telephone Encounter (Signed)
Pt aware Dr End has cleared him for hernia surgery, hold warfarin for 5 days prior to surgery, he verbalized understanding, thanked me for call.

## 2016-10-26 ENCOUNTER — Ambulatory Visit (INDEPENDENT_AMBULATORY_CARE_PROVIDER_SITE_OTHER): Payer: Medicare Other

## 2016-10-26 DIAGNOSIS — Z5181 Encounter for therapeutic drug level monitoring: Secondary | ICD-10-CM

## 2016-10-26 DIAGNOSIS — Z952 Presence of prosthetic heart valve: Secondary | ICD-10-CM

## 2016-10-26 DIAGNOSIS — Z7901 Long term (current) use of anticoagulants: Secondary | ICD-10-CM

## 2016-10-26 LAB — POCT INR: INR: 3.1

## 2016-11-13 NOTE — H&P (Signed)
David Benton Location: [facility] Patient #: [id number] DOB: 02-03-1929 Married / Language: English / Race: White Male        History of Present Illness        This is an 83 year old gentleman, referred by Dr. Alysia Penna for evaluation of symptomatic right inguinal hernia. [name] is his cardiologist. [name] is his neurologist      He has some comorbidities including Saint Jude aortic valve replacement on Coumadin, prostate cancer, and early parkinsonism. Despite that he is very active. Goes to the [facility] regularly for cycling classes and rock steady boxing classes. He started having a painful bulge in his right groin about 3 months ago. When he held off going to the [facility] the pain got better. Hernia was noted on physical exam. No history of incarceration. No prior hernias. He feels this interfering with his exercise program which she feels like is helping his tremor. His wife is with him today throughout the catheter      Comorbidities include Saint Jude aortic valve replacement 1991. On Coumadin. Prostatectomy by Dr. Jeffie Pollock 1995. Was followed for 11 years without recurrence. Hypertension. Hyperlipidemia. Mild tremor thought to be early parkinsonism.      Social history reveals he is married. No children. Denies tobacco. Has 1 alcoholic beverage a day. Very independent. There's to the [facility] for cycling classes and rock steady boxing. I congratulated him on this.      It is important for him to get the hernia repaired so that he can get back to his normal activities. He feels that this is slowing down progression of his parkinsonism. I think this is reasonable. We'll schedule for open repair of right inguinal hernia with mesh. I discussed the indications, details, techniques, and risk of the surgery with him and his wife in detail. He is aware of the risks of bleeding, infection, nerve damage with chronic pain or numbness, bruising. Urinary retention, rare  injury to adjacent organs such as the intestine or bladder with major reconstructive surgery. We talked about laparoscopic approach. I do not think that is a good idea because of his history of prostatectomy, anticoagulation with Coumadin. Recurrence is slightly statistically less with open repair      Plan open repair of right inguinal hernia with mesh at [place] Discontinue Coumadin 5 days preop if okay with his cardiologist Bridging therapy with Lovenox or heparin will be supervised by his cardiologist, it felt to be indicated Restart Coumadin 1 day postop Preoperative cardiac risk assessment   Past Surgical History  Colon Polyp Removal - Colonoscopy  Hemorrhoidectomy  Prostate Surgery - Removal  Tonsillectomy  Valve Replacement   Diagnostic Studies History  Colonoscopy  1-5 years ago  Allergies  Penicillin G Benzathine & Proc *PENICILLINS*   Medication History  Atorvastatin Calcium (40MG  Tablet, Oral) Active. Triamterene-HCTZ (37.5-25MG  Tablet, Oral) Active. Warfarin Sodium (5MG  Tablet, Oral) Active. Medications Reconciled  Social History  Alcohol use  Moderate alcohol use. Caffeine use  Carbonated beverages, Coffee. No drug use  Tobacco use  Former smoker.  Family History  Alcohol Abuse  Brother. Diabetes Mellitus  Brother, Mother. Heart Disease  Father, Mother. Heart disease in male family member before age 42  Respiratory Condition  Brother.  Other Problems Back Pain  Enlarged Prostate  Heart murmur  Hemorrhoids  Hypercholesterolemia  Prostate Cancer     Review of Systems General Not Present- Appetite Loss, Chills, Fatigue, Fever, Night Sweats, Weight Gain and Weight Loss. Skin Present-  Rash. Not Present- Change in Wart/Mole, Dryness, Hives, Jaundice, New Lesions, Non-Healing Wounds and Ulcer. HEENT Present- Hearing Loss, Nose Bleed and Wears glasses/contact lenses. Not Present- Earache, Hoarseness, Oral Ulcers, Ringing in  the Ears, Seasonal Allergies, Sinus Pain, Sore Throat, Visual Disturbances and Yellow Eyes. Respiratory Not Present- Bloody sputum, Chronic Cough, Difficulty Breathing, Snoring and Wheezing. Breast Not Present- Breast Mass, Breast Pain, Nipple Discharge and Skin Changes. Cardiovascular Not Present- Chest Pain, Difficulty Breathing Lying Down, Leg Cramps, Palpitations, Rapid Heart Rate, Shortness of Breath and Swelling of Extremities. Gastrointestinal Present- Constipation. Not Present- Abdominal Pain, Bloating, Bloody Stool, Change in Bowel Habits, Chronic diarrhea, Difficulty Swallowing, Excessive gas, Gets full quickly at meals, Hemorrhoids, Indigestion, Nausea, Rectal Pain and Vomiting. Male Genitourinary Present- Change in Urinary Stream. Not Present- Blood in Urine, Frequency, Impotence, Nocturia, Painful Urination, Urgency and Urine Leakage. Musculoskeletal Not Present- Back Pain, Joint Pain, Joint Stiffness, Muscle Pain, Muscle Weakness and Swelling of Extremities. Neurological Present- Tremor. Not Present- Decreased Memory, Fainting, Headaches, Numbness, Seizures, Tingling, Trouble walking and Weakness. Psychiatric Not Present- Anxiety, Bipolar, Change in Sleep Pattern, Depression, Fearful and Frequent crying. Endocrine Not Present- Cold Intolerance, Excessive Hunger, Hair Changes, Heat Intolerance, Hot flashes and New Diabetes. Hematology Present- Blood Thinners and Easy Bruising. Not Present- Excessive bleeding, Gland problems, HIV and Persistent Infections.  Vitals  Weight: 147 lb Height: 70in Body Surface Area: 1.83 m Body Mass Index: 21.09 kg/m  Pulse: 76 (Regular)  BP: 144/82 (Sitting, Left Arm, Standard)    Physical Exam  General Mental Status-Alert. General Appearance-Consistent with stated age. Hydration-Well hydrated. Voice-Normal.  Head and Neck Head-normocephalic, atraumatic with no lesions or palpable masses. Trachea-midline. Thyroid Gland  Characteristics - normal size and consistency.  Eye Eyeball - Bilateral-Extraocular movements intact. Sclera/Conjunctiva - Bilateral-No scleral icterus.  Chest and Lung Exam Chest and lung exam reveals -quiet, even and easy respiratory effort with no use of accessory muscles and on auscultation, normal breath sounds, no adventitious sounds and normal vocal resonance. Inspection Chest Wall - Normal. Back - normal.  Cardiovascular Cardiovascular examination reveals -normal heart sounds, regular rate and rhythm with no murmurs and normal pedal pulses bilaterally. Note: Regular rhythm. Loud mechanical heart sounds. Well-healed sternotomy scar. Femoral pulses palpable   Abdomen Inspection Inspection of the abdomen reveals - No Hernias. Skin - Scar - no surgical scars. Palpation/Percussion Palpation and Percussion of the abdomen reveal - Soft, Non Tender, No Rebound tenderness, No Rigidity (guarding) and No hepatosplenomegaly. Auscultation Auscultation of the abdomen reveals - Bowel sounds normal.  Male Genitourinary Note: Examined supine and standing. Small to medium size right inguinal hernia. Does not extend into the scrotum. No hernia on the left. Penoscrotal metastases are normal. No scrotal mass.   Neurologic Neurologic evaluation reveals -alert and oriented x 3 with no impairment of recent or remote memory. Mental Status-Normal.  Musculoskeletal Normal Exam - Left-Upper Extremity Strength Normal and Lower Extremity Strength Normal. Normal Exam - Right-Upper Extremity Strength Normal and Lower Extremity Strength Normal.  Lymphatic Head & Neck  General Head & Neck Lymphatics: Bilateral - Description - Normal. Axillary  General Axillary Region: Bilateral - Description - Normal. Tenderness - Non Tender. Femoral & Inguinal  Generalized Femoral & Inguinal Lymphatics: Bilateral - Description - Normal. Tenderness - Non Tender.    Assessment & Plan  RIGHT  INGUINAL HERNIA (K40.90)   You have a small to medium-sized right inguinal hernia You have developed pain with exercise Although there is no emergency, your symptoms will likely progress over  time We have decided to go ahead with open repair of right inguinal hernia with mesh in hopes of returning you to your normal physical activities  you will need to stop the Coumadin 5 days preop if okay with your cardiologist Your cardiologist will need to decide regarding bridging therapy with heparin or Lovenox Surgery will be done at [facility] you will possibly be able to go home the same day  We have discussed the indications, techniques, and risks of this surgery in detail   ANTICOAGULATED ON COUMADIN (Z51.81)  STATUS POST MECHANICAL AORTIC VALVE REPLACEMENT (Z95.2)  PARKINSON DISEASE (G20) HYPERTENSION, ESSENTIAL (I10) HISTORY OF PROSTATECTOMY (Z90.79) HISTORY OF PROSTATE CANCER (Z85.46)    Edsel Petrin. Dalbert Batman, M.D., [facility] General and Minimally invasive Surgery Breast and Colorectal Surgery Office:   [phone] Pager:   [phone]

## 2016-11-14 ENCOUNTER — Encounter (HOSPITAL_BASED_OUTPATIENT_CLINIC_OR_DEPARTMENT_OTHER): Payer: Self-pay | Admitting: *Deleted

## 2016-11-16 ENCOUNTER — Other Ambulatory Visit: Payer: Self-pay | Admitting: Cardiology

## 2016-11-17 ENCOUNTER — Encounter (HOSPITAL_BASED_OUTPATIENT_CLINIC_OR_DEPARTMENT_OTHER)
Admission: RE | Admit: 2016-11-17 | Discharge: 2016-11-17 | Disposition: A | Discharge status: Home / Self Care | Payer: Medicare Other | Source: Ambulatory Visit | Attending: General Surgery | Admitting: General Surgery

## 2016-11-17 DIAGNOSIS — I739 Peripheral vascular disease, unspecified: Secondary | ICD-10-CM | POA: Diagnosis not present

## 2016-11-17 DIAGNOSIS — E785 Hyperlipidemia, unspecified: Secondary | ICD-10-CM | POA: Diagnosis not present

## 2016-11-17 DIAGNOSIS — I10 Essential (primary) hypertension: Secondary | ICD-10-CM | POA: Diagnosis not present

## 2016-11-17 DIAGNOSIS — Z9079 Acquired absence of other genital organ(s): Secondary | ICD-10-CM | POA: Diagnosis not present

## 2016-11-17 DIAGNOSIS — D176 Benign lipomatous neoplasm of spermatic cord: Secondary | ICD-10-CM | POA: Diagnosis not present

## 2016-11-17 DIAGNOSIS — Z7901 Long term (current) use of anticoagulants: Secondary | ICD-10-CM | POA: Diagnosis not present

## 2016-11-17 DIAGNOSIS — E7800 Pure hypercholesterolemia, unspecified: Secondary | ICD-10-CM | POA: Diagnosis not present

## 2016-11-17 DIAGNOSIS — Z8546 Personal history of malignant neoplasm of prostate: Secondary | ICD-10-CM | POA: Diagnosis not present

## 2016-11-17 DIAGNOSIS — C61 Malignant neoplasm of prostate: Secondary | ICD-10-CM | POA: Diagnosis not present

## 2016-11-17 DIAGNOSIS — Z79899 Other long term (current) drug therapy: Secondary | ICD-10-CM | POA: Diagnosis not present

## 2016-11-17 DIAGNOSIS — G20 Parkinson's disease: Secondary | ICD-10-CM | POA: Diagnosis not present

## 2016-11-17 DIAGNOSIS — K4090 Unilateral inguinal hernia, without obstruction or gangrene, not specified as recurrent: Secondary | ICD-10-CM | POA: Diagnosis not present

## 2016-11-17 DIAGNOSIS — Z952 Presence of prosthetic heart valve: Secondary | ICD-10-CM | POA: Diagnosis not present

## 2016-11-17 LAB — CBC WITH DIFFERENTIAL/PLATELET
Basophils Absolute: 0 10*3/uL (ref 0.0–0.1)
Basophils Relative: 1 %
Eosinophils Absolute: 0.1 10*3/uL (ref 0.0–0.7)
Eosinophils Relative: 4 %
HEMATOCRIT: 40.8 % (ref 39.0–52.0)
HEMOGLOBIN: 13.4 g/dL (ref 13.0–17.0)
LYMPHS ABS: 1 10*3/uL (ref 0.7–4.0)
Lymphocytes Relative: 29 %
MCH: 30.1 pg (ref 26.0–34.0)
MCHC: 32.8 g/dL (ref 30.0–36.0)
MCV: 91.7 fL (ref 78.0–100.0)
Monocytes Absolute: 0.5 10*3/uL (ref 0.1–1.0)
Monocytes Relative: 13 %
NEUTROS ABS: 2 10*3/uL (ref 1.7–7.7)
NEUTROS PCT: 53 %
Platelets: 198 10*3/uL (ref 150–400)
RBC: 4.45 MIL/uL (ref 4.22–5.81)
RDW: 13.8 % (ref 11.5–15.5)
WBC: 3.6 10*3/uL — AB (ref 4.0–10.5)

## 2016-11-17 LAB — COMPREHENSIVE METABOLIC PANEL
ALT: 19 U/L (ref 17–63)
ANION GAP: 7 (ref 5–15)
AST: 23 U/L (ref 15–41)
Albumin: 4.1 g/dL (ref 3.5–5.0)
Alkaline Phosphatase: 80 U/L (ref 38–126)
BUN: 20 mg/dL (ref 6–20)
CHLORIDE: 101 mmol/L (ref 101–111)
CO2: 32 mmol/L (ref 22–32)
CREATININE: 1.1 mg/dL (ref 0.61–1.24)
Calcium: 9.6 mg/dL (ref 8.9–10.3)
GFR, EST NON AFRICAN AMERICAN: 59 mL/min — AB (ref >60)
Glucose, Bld: 108 mg/dL — ABNORMAL HIGH (ref 65–99)
POTASSIUM: 4 mmol/L (ref 3.5–5.1)
SODIUM: 140 mmol/L (ref 135–145)
Total Bilirubin: 3.2 mg/dL — ABNORMAL HIGH (ref 0.3–1.2)
Total Protein: 6.6 g/dL (ref 6.5–8.1)

## 2016-11-17 LAB — PROTIME-INR
INR: 1.15
PROTHROMBIN TIME: 14.6 s (ref 11.4–15.2)

## 2016-11-17 NOTE — Progress Notes (Signed)
Ensure pre surgery drink given with instructions to complete by 0445 dos, pt verbalized understanding. 

## 2016-11-18 ENCOUNTER — Ambulatory Visit (HOSPITAL_BASED_OUTPATIENT_CLINIC_OR_DEPARTMENT_OTHER)
Admission: RE | Admit: 2016-11-18 | Discharge: 2016-11-18 | Disposition: A | Discharge status: Home / Self Care | Payer: Medicare Other | Source: Ambulatory Visit | Attending: General Surgery | Admitting: General Surgery

## 2016-11-18 ENCOUNTER — Ambulatory Visit (HOSPITAL_BASED_OUTPATIENT_CLINIC_OR_DEPARTMENT_OTHER): Payer: Medicare Other | Admitting: Anesthesiology

## 2016-11-18 ENCOUNTER — Encounter (HOSPITAL_BASED_OUTPATIENT_CLINIC_OR_DEPARTMENT_OTHER)
Admission: RE | Disposition: A | Discharge status: Home / Self Care | Payer: Self-pay | Source: Ambulatory Visit | Attending: General Surgery

## 2016-11-18 ENCOUNTER — Encounter (HOSPITAL_BASED_OUTPATIENT_CLINIC_OR_DEPARTMENT_OTHER): Payer: Self-pay | Admitting: *Deleted

## 2016-11-18 DIAGNOSIS — G8918 Other acute postprocedural pain: Secondary | ICD-10-CM | POA: Diagnosis not present

## 2016-11-18 DIAGNOSIS — D176 Benign lipomatous neoplasm of spermatic cord: Secondary | ICD-10-CM | POA: Diagnosis not present

## 2016-11-18 DIAGNOSIS — G20 Parkinson's disease: Secondary | ICD-10-CM | POA: Diagnosis not present

## 2016-11-18 DIAGNOSIS — Z8546 Personal history of malignant neoplasm of prostate: Secondary | ICD-10-CM | POA: Insufficient documentation

## 2016-11-18 DIAGNOSIS — C61 Malignant neoplasm of prostate: Secondary | ICD-10-CM | POA: Insufficient documentation

## 2016-11-18 DIAGNOSIS — K4090 Unilateral inguinal hernia, without obstruction or gangrene, not specified as recurrent: Secondary | ICD-10-CM | POA: Diagnosis not present

## 2016-11-18 DIAGNOSIS — Z7901 Long term (current) use of anticoagulants: Secondary | ICD-10-CM | POA: Insufficient documentation

## 2016-11-18 DIAGNOSIS — I739 Peripheral vascular disease, unspecified: Secondary | ICD-10-CM | POA: Insufficient documentation

## 2016-11-18 DIAGNOSIS — I10 Essential (primary) hypertension: Secondary | ICD-10-CM | POA: Insufficient documentation

## 2016-11-18 DIAGNOSIS — E785 Hyperlipidemia, unspecified: Secondary | ICD-10-CM | POA: Diagnosis not present

## 2016-11-18 DIAGNOSIS — E7800 Pure hypercholesterolemia, unspecified: Secondary | ICD-10-CM | POA: Insufficient documentation

## 2016-11-18 DIAGNOSIS — Z79899 Other long term (current) drug therapy: Secondary | ICD-10-CM | POA: Insufficient documentation

## 2016-11-18 DIAGNOSIS — Z9079 Acquired absence of other genital organ(s): Secondary | ICD-10-CM | POA: Insufficient documentation

## 2016-11-18 DIAGNOSIS — Z952 Presence of prosthetic heart valve: Secondary | ICD-10-CM | POA: Insufficient documentation

## 2016-11-18 HISTORY — DX: Parkinson's disease: G20

## 2016-11-18 HISTORY — DX: Unilateral inguinal hernia, without obstruction or gangrene, not specified as recurrent: K40.90

## 2016-11-18 HISTORY — PX: INGUINAL HERNIA REPAIR: SHX194

## 2016-11-18 HISTORY — PX: INSERTION OF MESH: SHX5868

## 2016-11-18 SURGERY — REPAIR, HERNIA, INGUINAL, ADULT
Anesthesia: General | Site: Groin | Laterality: Right

## 2016-11-18 MED ORDER — ONDANSETRON HCL 4 MG/2ML IJ SOLN
INTRAMUSCULAR | Status: AC
  Filled 2016-11-18: qty 2

## 2016-11-18 MED ORDER — SCOPOLAMINE 1 MG/3DAYS TD PT72: 1.0000 | MEDICATED_PATCH | Freq: Once | TRANSDERMAL | Status: DC | PRN

## 2016-11-18 MED ORDER — MIDAZOLAM HCL 2 MG/2ML IJ SOLN: 1.0000 mg | INTRAMUSCULAR | Status: DC | PRN

## 2016-11-18 MED ORDER — BUPIVACAINE-EPINEPHRINE 0.5% -1:200000 IJ SOLN
INTRAMUSCULAR | Status: DC | PRN
  Administered 2016-11-18: 10 mL

## 2016-11-18 MED ORDER — BUPIVACAINE-EPINEPHRINE (PF) 0.5% -1:200000 IJ SOLN
INTRAMUSCULAR | Status: DC | PRN
  Administered 2016-11-18: 20 mL via PERINEURAL

## 2016-11-18 MED ORDER — CEFAZOLIN SODIUM-DEXTROSE 2-4 GM/100ML-% IV SOLN
2.0000 g | INTRAVENOUS | Status: AC
  Administered 2016-11-18: 2 g via INTRAVENOUS

## 2016-11-18 MED ORDER — LIDOCAINE 2% (20 MG/ML) 5 ML SYRINGE
INTRAMUSCULAR | Status: AC
  Filled 2016-11-18: qty 5

## 2016-11-18 MED ORDER — DEXAMETHASONE SODIUM PHOSPHATE 10 MG/ML IJ SOLN
INTRAMUSCULAR | Status: AC
  Filled 2016-11-18: qty 1

## 2016-11-18 MED ORDER — ROCURONIUM BROMIDE 10 MG/ML (PF) SYRINGE
PREFILLED_SYRINGE | INTRAVENOUS | Status: AC
  Filled 2016-11-18: qty 5

## 2016-11-18 MED ORDER — ONDANSETRON HCL 4 MG/2ML IJ SOLN: 4.0000 mg | Freq: Four times a day (QID) | INTRAMUSCULAR | Status: DC | PRN

## 2016-11-18 MED ORDER — LACTATED RINGERS IV SOLN: 500.0000 mL | INTRAVENOUS | Status: DC

## 2016-11-18 MED ORDER — PHENYLEPHRINE HCL 10 MG/ML IJ SOLN
INTRAMUSCULAR | Status: AC
  Filled 2016-11-18: qty 1

## 2016-11-18 MED ORDER — CHLORHEXIDINE GLUCONATE CLOTH 2 % EX PADS: 6.0000 | MEDICATED_PAD | Freq: Once | CUTANEOUS | Status: DC

## 2016-11-18 MED ORDER — HYDROCODONE-ACETAMINOPHEN 5-325 MG PO TABS: 1.0000 | ORAL_TABLET | Freq: Four times a day (QID) | ORAL | 0 refills | Status: DC | PRN

## 2016-11-18 MED ORDER — ROCURONIUM BROMIDE 100 MG/10ML IV SOLN
INTRAVENOUS | Status: DC | PRN
  Administered 2016-11-18: 40 mg via INTRAVENOUS

## 2016-11-18 MED ORDER — OXYCODONE HCL 5 MG PO TABS: 5.0000 mg | ORAL_TABLET | Freq: Once | ORAL | Status: DC | PRN

## 2016-11-18 MED ORDER — FENTANYL CITRATE (PF) 100 MCG/2ML IJ SOLN
50.0000 ug | INTRAMUSCULAR | Status: DC | PRN
  Administered 2016-11-18 (×2): 50 ug via INTRAVENOUS

## 2016-11-18 MED ORDER — FENTANYL CITRATE (PF) 100 MCG/2ML IJ SOLN: 25.0000 ug | INTRAMUSCULAR | Status: DC | PRN

## 2016-11-18 MED ORDER — CELECOXIB 100 MG PO CAPS
100.0000 mg | ORAL_CAPSULE | ORAL | Status: AC
  Administered 2016-11-18: 100 mg via ORAL

## 2016-11-18 MED ORDER — CELECOXIB 100 MG PO CAPS
ORAL_CAPSULE | ORAL | Status: AC
  Filled 2016-11-18: qty 1

## 2016-11-18 MED ORDER — CELECOXIB 200 MG PO CAPS
ORAL_CAPSULE | ORAL | Status: AC
  Filled 2016-11-18: qty 1

## 2016-11-18 MED ORDER — GABAPENTIN 300 MG PO CAPS
ORAL_CAPSULE | ORAL | Status: AC
  Filled 2016-11-18: qty 1

## 2016-11-18 MED ORDER — DEXAMETHASONE SODIUM PHOSPHATE 4 MG/ML IJ SOLN
INTRAMUSCULAR | Status: DC | PRN
  Administered 2016-11-18: 10 mg via INTRAVENOUS

## 2016-11-18 MED ORDER — GABAPENTIN 300 MG PO CAPS
300.0000 mg | ORAL_CAPSULE | ORAL | Status: AC
  Administered 2016-11-18: 300 mg via ORAL

## 2016-11-18 MED ORDER — ONDANSETRON HCL 4 MG/2ML IJ SOLN
INTRAMUSCULAR | Status: DC | PRN
  Administered 2016-11-18: 4 mg via INTRAVENOUS

## 2016-11-18 MED ORDER — LACTATED RINGERS IV SOLN
INTRAVENOUS | Status: DC
  Administered 2016-11-18: 08:00:00 via INTRAVENOUS

## 2016-11-18 MED ORDER — SUGAMMADEX SODIUM 200 MG/2ML IV SOLN
INTRAVENOUS | Status: DC | PRN
  Administered 2016-11-18: 200 mg via INTRAVENOUS

## 2016-11-18 MED ORDER — PROPOFOL 10 MG/ML IV BOLUS
INTRAVENOUS | Status: DC | PRN
  Administered 2016-11-18: 90 mg via INTRAVENOUS

## 2016-11-18 MED ORDER — LIDOCAINE HCL (CARDIAC) 20 MG/ML IV SOLN
INTRAVENOUS | Status: DC | PRN
  Administered 2016-11-18: 40 mg via INTRAVENOUS

## 2016-11-18 MED ORDER — MIDAZOLAM HCL 2 MG/2ML IJ SOLN
INTRAMUSCULAR | Status: AC
  Filled 2016-11-18: qty 2

## 2016-11-18 MED ORDER — ACETAMINOPHEN 500 MG PO TABS
1000.0000 mg | ORAL_TABLET | ORAL | Status: AC
  Administered 2016-11-18: 1000 mg via ORAL

## 2016-11-18 MED ORDER — ACETAMINOPHEN 500 MG PO TABS
ORAL_TABLET | ORAL | Status: AC
  Filled 2016-11-18: qty 1

## 2016-11-18 MED ORDER — FENTANYL CITRATE (PF) 100 MCG/2ML IJ SOLN
INTRAMUSCULAR | Status: AC
  Filled 2016-11-18: qty 2

## 2016-11-18 MED ORDER — CEFAZOLIN SODIUM-DEXTROSE 2-4 GM/100ML-% IV SOLN
INTRAVENOUS | Status: AC
  Filled 2016-11-18: qty 100

## 2016-11-18 MED ORDER — OXYCODONE HCL 5 MG/5ML PO SOLN: 5.0000 mg | Freq: Once | ORAL | Status: DC | PRN

## 2016-11-18 MED ORDER — PHENYLEPHRINE HCL 10 MG/ML IJ SOLN
INTRAMUSCULAR | Status: DC | PRN
  Administered 2016-11-18: 50 ug/min via INTRAVENOUS

## 2016-11-18 MED ORDER — SUGAMMADEX SODIUM 200 MG/2ML IV SOLN
INTRAVENOUS | Status: AC
  Filled 2016-11-18: qty 2

## 2016-11-18 SURGICAL SUPPLY — 61 items
BENZOIN TINCTURE PRP APPL 2/3 (GAUZE/BANDAGES/DRESSINGS) IMPLANT
BLADE CLIPPER SURG (BLADE) ×3 IMPLANT
BLADE HEX COATED 2.75 (ELECTRODE) ×3 IMPLANT
BLADE SURG 10 STRL SS (BLADE) ×3 IMPLANT
CANISTER SUCT 1200ML W/VALVE (MISCELLANEOUS) ×3 IMPLANT
CHLORAPREP W/TINT 26ML (MISCELLANEOUS) ×3 IMPLANT
CLOSURE WOUND 1/2 X4 (GAUZE/BANDAGES/DRESSINGS)
COVER BACK TABLE 60X90IN (DRAPES) ×3 IMPLANT
COVER MAYO STAND STRL (DRAPES) ×3 IMPLANT
DECANTER SPIKE VIAL GLASS SM (MISCELLANEOUS) IMPLANT
DERMABOND ADVANCED (GAUZE/BANDAGES/DRESSINGS) ×2
DERMABOND ADVANCED .7 DNX12 (GAUZE/BANDAGES/DRESSINGS) ×1 IMPLANT
DRAIN PENROSE 1/2X12 LTX STRL (WOUND CARE) ×3 IMPLANT
DRAPE LAPAROTOMY 100X72 PEDS (DRAPES) ×3 IMPLANT
DRAPE LAPAROTOMY TRNSV 102X78 (DRAPE) IMPLANT
DRAPE UTILITY XL STRL (DRAPES) ×3 IMPLANT
ELECT REM PT RETURN 9FT ADLT (ELECTROSURGICAL) ×3
ELECTRODE REM PT RTRN 9FT ADLT (ELECTROSURGICAL) ×1 IMPLANT
GAUZE SPONGE 4X4 12PLY STRL LF (GAUZE/BANDAGES/DRESSINGS) IMPLANT
GLOVE BIO SURGEON STRL SZ 6.5 (GLOVE) ×2 IMPLANT
GLOVE BIO SURGEONS STRL SZ 6.5 (GLOVE) ×1
GLOVE EUDERMIC 7 POWDERFREE (GLOVE) ×3 IMPLANT
GLOVE EXAM NITRILE MD LF STRL (GLOVE) ×3 IMPLANT
GOWN STRL REUS W/ TWL LRG LVL3 (GOWN DISPOSABLE) ×1 IMPLANT
GOWN STRL REUS W/ TWL XL LVL3 (GOWN DISPOSABLE) ×1 IMPLANT
GOWN STRL REUS W/TWL LRG LVL3 (GOWN DISPOSABLE) ×2
GOWN STRL REUS W/TWL XL LVL3 (GOWN DISPOSABLE) ×2
MESH ULTRAPRO 3X6 7.6X15CM (Mesh General) ×3 IMPLANT
NEEDLE HYPO 22GX1.5 SAFETY (NEEDLE) IMPLANT
NEEDLE HYPO 25X1 1.5 SAFETY (NEEDLE) ×3 IMPLANT
NS IRRIG 1000ML POUR BTL (IV SOLUTION) ×3 IMPLANT
PACK BASIN DAY SURGERY FS (CUSTOM PROCEDURE TRAY) ×3 IMPLANT
PENCIL BUTTON HOLSTER BLD 10FT (ELECTRODE) ×3 IMPLANT
SLEEVE SCD COMPRESS KNEE MED (MISCELLANEOUS) ×3 IMPLANT
SPONGE LAP 4X18 X RAY DECT (DISPOSABLE) IMPLANT
STAPLER VISISTAT 35W (STAPLE) IMPLANT
STRIP CLOSURE SKIN 1/2X4 (GAUZE/BANDAGES/DRESSINGS) IMPLANT
SUT MNCRL AB 4-0 PS2 18 (SUTURE) ×3 IMPLANT
SUT PROLENE 1 CT (SUTURE) IMPLANT
SUT PROLENE 2 0 CT2 30 (SUTURE) ×9 IMPLANT
SUT SILK 2 0 SH (SUTURE) IMPLANT
SUT SILK 2 0 TIES 17X18 (SUTURE) ×2
SUT SILK 2-0 18XBRD TIE BLK (SUTURE) ×1 IMPLANT
SUT SILK 3 0 SH 30 (SUTURE) IMPLANT
SUT VIC AB 2-0 CT1 27 (SUTURE)
SUT VIC AB 2-0 CT1 TAPERPNT 27 (SUTURE) IMPLANT
SUT VIC AB 2-0 SH 27 (SUTURE) ×4
SUT VIC AB 2-0 SH 27XBRD (SUTURE) ×2 IMPLANT
SUT VIC AB 3-0 54X BRD REEL (SUTURE) IMPLANT
SUT VIC AB 3-0 BRD 54 (SUTURE)
SUT VIC AB 3-0 FS2 27 (SUTURE) IMPLANT
SUT VIC AB 3-0 SH 27 (SUTURE) ×2
SUT VIC AB 3-0 SH 27X BRD (SUTURE) ×1 IMPLANT
SUT VICRYL 3-0 CR8 SH (SUTURE) IMPLANT
SYR 10ML LL (SYRINGE) ×3 IMPLANT
SYR 20CC LL (SYRINGE) ×3 IMPLANT
TOWEL OR NON WOVEN STRL DISP B (DISPOSABLE) IMPLANT
TRAY DSU PREP LF (CUSTOM PROCEDURE TRAY) ×3 IMPLANT
TUBE CONNECTING 20'X1/4 (TUBING) ×1
TUBE CONNECTING 20X1/4 (TUBING) ×2 IMPLANT
YANKAUER SUCT BULB TIP NO VENT (SUCTIONS) ×3 IMPLANT

## 2016-11-18 NOTE — Anesthesia Preprocedure Evaluation (Signed)
Anesthesia Evaluation  Patient identified by MRN, date of birth, ID band Patient awake    Reviewed: Allergy & Precautions, H&P , NPO status , Patient's Chart, lab work & pertinent test results  Airway Mallampati: II   Neck ROM: full    Dental   Pulmonary former smoker,    breath sounds clear to auscultation       Cardiovascular hypertension, + Peripheral Vascular Disease  + Valvular Problems/Murmurs  Rhythm:regular Rate:Normal  S/p AVR   Neuro/Psych Parkinson's dz    GI/Hepatic   Endo/Other    Renal/GU      Musculoskeletal   Abdominal   Peds  Hematology   Anesthesia Other Findings   Reproductive/Obstetrics                             Anesthesia Physical Anesthesia Plan  ASA: III  Anesthesia Plan: General   Post-op Pain Management:  Regional for Post-op pain   Induction: Intravenous  PONV Risk Score and Plan: 2 and Ondansetron, Dexamethasone and Treatment may vary due to age or medical condition  Airway Management Planned: Oral ETT  Additional Equipment:   Intra-op Plan:   Post-operative Plan: Extubation in OR  Informed Consent: I have reviewed the patients History and Physical, chart, labs and discussed the procedure including the risks, benefits and alternatives for the proposed anesthesia with the patient or authorized representative who has indicated his/her understanding and acceptance.     Plan Discussed with: CRNA, Anesthesiologist and Surgeon  Anesthesia Plan Comments:         Anesthesia Quick Evaluation

## 2016-11-18 NOTE — Discharge Instructions (Signed)
[facility] _______[facility] ° °UMBILICAL OR INGUINAL HERNIA REPAIR: POST OP INSTRUCTIONS ° °Always review your discharge instruction sheet given to you by the facility where your surgery was performed. °IF YOU HAVE DISABILITY OR FAMILY LEAVE FORMS, YOU MUST BRING THEM TO THE OFFICE FOR PROCESSING.   °DO NOT GIVE THEM TO YOUR DOCTOR. ° °1. A  prescription for pain medication may be given to you upon discharge.  Take your pain medication as prescribed, if needed.  If narcotic pain medicine is not needed, then you may take acetaminophen (Tylenol) or ibuprofen (Advil) as needed. °2. Take your usually prescribed medications unless otherwise directed. °If you need a refill on your pain medication, please contact your pharmacy.  They will contact our office to request authorization. Prescriptions will not be filled after 5 pm or on week-ends. °3. You should follow a light diet the first 24 hours after arrival home, such as soup and crackers, etc.  Be sure to include lots of fluids daily.  Resume your normal diet the day after surgery. °4.Most patients will experience some swelling and bruising around the umbilicus or in the groin and scrotum.  Ice packs and reclining will help.  Swelling and bruising can take several days to resolve.  °6. It is common to experience some constipation if taking pain medication after surgery.  Increasing fluid intake and taking a stool softener (such as Colace) will usually help or prevent this problem from occurring.  A mild laxative (Milk of Magnesia or Miralax) should be taken according to package directions if there are no bowel movements after 48 hours. °7. Unless discharge instructions indicate otherwise, you may remove your bandages 24-48 hours after surgery, and you may shower at that time.  You may have steri-strips (small skin tapes) in place directly over the incision.  These strips should be left on the skin for 7-10 days.  If your surgeon used skin glue on the  incision, you may shower in 24 hours.  The glue will flake off over the next 2-3 weeks.  Any sutures or staples will be removed at the office during your follow-up visit. °8. ACTIVITIES:  You may resume regular (light) daily activities beginning the next day--such as daily self-care, walking, climbing stairs--gradually increasing activities as tolerated.  You may have sexual intercourse when it is comfortable.  Refrain from any heavy lifting or straining until approved by your doctor. ° °a.You may drive when you are no longer taking prescription pain medication, you can comfortably wear a seatbelt, and you can safely maneuver your car and apply brakes. °b.RETURN TO WORK:   °_____________________________________________ ° °9.You should see your doctor in the office for a follow-up appointment approximately 2-3 weeks after your surgery.  Make sure that you call for this appointment within a day or two after you arrive home to insure a convenient appointment time. °10.OTHER INSTRUCTIONS: _________________________ °   _____________________________________ ° °WHEN TO CALL YOUR DOCTOR: °1. Fever over 101.0 °2. Inability to urinate °3. Nausea and/or vomiting °4. Extreme swelling or bruising °5. Continued bleeding from incision. °6. Increased pain, redness, or drainage from the incision ° °The clinic staff is available to answer your questions during regular business hours.  Please don’t hesitate to call and ask to speak to one of the nurses for clinical concerns.  If you have a medical emergency, go to the nearest emergency room or call 911.  A surgeon from [facility] is always on call at the hospital ° ° °  [address], [address], [place], [place]  [address] ? ° [address] [address], [place], [place]   [address] °[phone] ? [phone] ? FAX [phone] °Web site: [email or web address] ° ° °Post Anesthesia Home Care Instructions ° °Activity: °Get plenty of rest for the remainder of the day. A  responsible individual must stay with you for 24 hours following the procedure.  °For the next 24 hours, DO NOT: °-Drive a car °-Operate machinery °-Drink alcoholic beverages °-Take any medication unless instructed by your physician °-Make any legal decisions or sign important papers. ° °Meals: °Start with liquid foods such as gelatin or soup. Progress to regular foods as tolerated. Avoid greasy, spicy, heavy foods. If nausea and/or vomiting occur, drink only clear liquids until the nausea and/or vomiting subsides. Call your physician if vomiting continues. ° °Special Instructions/Symptoms: °Your throat may feel dry or sore from the anesthesia or the breathing tube placed in your throat during surgery. If this causes discomfort, gargle with warm salt water. The discomfort should disappear within 24 hours. ° °If you had a scopolamine patch placed behind your ear for the management of post- operative nausea and/or vomiting: ° °1. The medication in the patch is effective for 72 hours, after which it should be removed.  Wrap patch in a tissue and discard in the trash. Wash hands thoroughly with soap and water. °2. You may remove the patch earlier than 72 hours if you experience unpleasant side effects which may include dry mouth, dizziness or visual disturbances. °3. Avoid touching the patch. Wash your hands with soap and water after contact with the patch. °  ° °

## 2016-11-18 NOTE — Anesthesia Procedure Notes (Signed)
Anesthesia Regional Block: TAP block   Pre-Anesthetic Checklist: ,, timeout performed, Correct Patient, Correct Site, Correct Laterality, Correct Procedure, Correct Position, site marked, Risks and benefits discussed,  Surgical consent,  Pre-op evaluation,  At surgeon's request and post-op pain management  Laterality: Right  Prep: chloraprep       Needles:  Injection technique: Single-shot  Needle Type: Echogenic Needle     Needle Length: 9cm  Needle Gauge: 21     Additional Needles:   Narrative:  Start time: 11/18/2016 8:20 AM End time: 11/18/2016 8:25 AM Injection made incrementally with aspirations every 5 mL.  Performed by: Personally  Anesthesiologist: Duwayne Matters  Additional Notes: Pt tolerated the procedure well.

## 2016-11-18 NOTE — Anesthesia Postprocedure Evaluation (Signed)
Anesthesia Post Note  Patient: David Benton  Procedure(s) Performed: OPEN REPAIR RIGHT INGUINAL HERNIA WITH MESH (Right Groin) INSERTION OF MESH (Right Groin)     Patient location during evaluation: PACU Anesthesia Type: General Level of consciousness: awake and alert Pain management: pain level controlled Vital Signs Assessment: post-procedure vital signs reviewed and stable Respiratory status: spontaneous breathing, nonlabored ventilation, respiratory function stable and patient connected to nasal cannula oxygen Cardiovascular status: blood pressure returned to baseline and stable Postop Assessment: no apparent nausea or vomiting Anesthetic complications: no    Last Vitals:  Vitals:   11/18/16 1030 11/18/16 1045  BP: (!) 118/55 (!) 123/53  Pulse: (!) 51 (!) 50  Resp: 17 18  Temp:    SpO2: 99% 96%    Last Pain:  Vitals:   11/18/16 1045  TempSrc:   PainSc: [place]

## 2016-11-18 NOTE — Op Note (Signed)
Patient Name:           David Benton   Date of Surgery:        11/18/2016  Pre op Diagnosis:      Right inguinal hernia  Post op Diagnosis:    Right inguinal hernia  Procedure:                 Open, Lichtenstein repair right inguinal hernia with ultra Pro mesh  Surgeon:                     Edsel Petrin. Dalbert Batman, M.D., FACS  Assistant:                      OR staff   Indication for Assistant: N/A   Operative Indications:     This is an 83 year old gentleman, referred by Dr. Alysia Penna for evaluation of symptomatic right inguinal hernia. [name] is his cardiologist. [name] is his neurologist      He has some comorbidities including Saint Jude aortic valve replacement on Coumadin, prostate cancer, and early parkinsonism. Despite that he is very active. Goes to the [facility] regularly for cycling classes and rock steady boxing classes. He started having a painful bulge in his right groin about 3 months ago. When he held off going to the [facility] the pain got better. Hernia was noted on physical exam. No history of incarceration. No prior hernias. He feels this interfering with his exercise program which she feels like is helping his tremor.       Comorbidities include Saint Jude aortic valve replacement 1991. On Coumadin. Prostatectomy by Dr. Jeffie Pollock 1995. Was followed for 11 years without recurrence. Hypertension. Hyperlipidemia. Mild tremor thought to be early parkinsonism.      It is important for him to get the hernia repaired so that he can get back to his normal activities. He feels that this is slowing down progression of his parkinsonism. I think this is reasonable. We'll schedule for open repair of right inguinal hernia with mesh.    he has been off of his Coumadin for 5 days.  His cardiologist states that he does not need bridging therapy.  He will restart his Coumadin tomorrow   Operative Findings:       He had an indirect right internal hernia and a lipoma of the cord.  There  was no evidence of femoral hernia.  Procedure in Detail:          The patient underwent TAPP block in the holding area by anesthesia.  He was brought to the operating room and underwent general endotracheal anesthesia.  The abdomen and genitalia were prepped and draped in a sterile fashion.  Surgical timeout was performed.  Intravenous antibiotics were given.  0.5% Marcaine with epinephrine was used as local infiltration anesthetic into the skin and deep subcutaneous tissues.    A transverse incision was made in the right groin overlying the right inguinal canal.  Dissection was carried down to the external oblique.  The external oblique was incised in the direction of its fibers, opening of the external inguinal ring.  The external oblique was dissected away from the underlying tissues.  Self-retaining retractors were placed.  Ilioinguinal nerve was traced back to its origin from the muscle laterally, clamped, divided, and ligated with 2-0 silk ties to prevent neuroma formation.  The redundant nerve medially was resected.  The cord structures were mobilized and encircled with a Penrose  drain.     I dissected a lipoma away from the cord structures and resected that.  I found an indirect hernia sac and dissected all the way back to the internal ring.  I opened the indirect sac and it was empty.  I was able to palpate the peritoneal cavity.  There was no evidence of femoral hernia.  The indirect sac was twisted and suture ligated at the level of the internal ring with a 2-0 Vicryl tie.  The redundant sac was excised and discarded.  The floor of the inguinal canal was repaired and reinforced with a 3" x 6 " piece of ultra Pro mesh.  The mesh was trimmed at its corners to accommodate the anatomy of the wound.  The mesh was sutured in place with running suture of 2-0 Prolene and interrupted sutures of 2-0 Prolene.  The mesh was sutured so as to generously overlap the fascia at the pubic tubercle, then along the  inguinal ligament inferiorly.  Medially, superiorly, and superiolaterally interrupted mattress sutures of 2-0 Prolene were placed.  The mesh was incised laterally so as to wraparound the cord structures at the internal ring.  The tails of the mesh were overlapped laterally.  Further Prolene sutures were placed laterally.  This provided very secure repair and coverage both medial and lateral to the internal ring but allowed an adequate fingertip opening for the cord structures.  Hemostasis was excellent.  We spent a lot of time being careful on hemostasis because he is on Coumadin.  The wound was irrigated.  The external oblique was closed with a running suture of 2-0 Vicryl placing the cord structures deep to the external oblique.  Scarpa's fascia was closed with 3-0 Vicryl sutures and the skin closed with running subcutaneous taken of 4-0 Monocryl and Dermabond.  The patient tolerated the procedure well was taken to PACU in stable condition.  EBL 10 mL.  Counts correct.  Complications none.     Edsel Petrin. Dalbert Batman, M.D., FACS General and Minimally Invasive Surgery Breast and Colorectal Surgery    Addendum: I logged onto the [facility] website and reviewed his prescription medication history  11/18/2016 9:57 AM

## 2016-11-18 NOTE — Interval H&P Note (Signed)
History and Physical Interval Note:  11/18/2016 7:21 AM  David Benton  has presented today for surgery, with the diagnosis of RIGHT INGUINAL HERNIA   The various methods of treatment have been discussed with the patient and family. After consideration of risks, benefits and other options for treatment, the patient has consented to  Procedure(s): OPEN REPAIR RIGHT INGUINAL HERNIA WITH MESH (Right) INSERTION OF MESH (Right) as a surgical intervention .  The patient's history has been reviewed, patient examined, no change in status, stable for surgery.  I have reviewed the patient's chart and labs.  Questions were answered to the patient's satisfaction.     [name]

## 2016-11-18 NOTE — Progress Notes (Signed)
AssistedDr. Hodierne with right, ultrasound guided, transabdominal plane block. Side rails up, monitors on throughout procedure. See vital signs in flow sheet. Tolerated Procedure well. ° °

## 2016-11-18 NOTE — Transfer of Care (Signed)
Immediate Anesthesia Transfer of Care Note  Patient: David Benton  Procedure(s) Performed: OPEN REPAIR RIGHT INGUINAL HERNIA WITH MESH (Right Groin) INSERTION OF MESH (Right Groin)  Patient Location: PACU  Anesthesia Type:GA combined with regional for post-op pain  Level of Consciousness: awake and patient cooperative  Airway & Oxygen Therapy: Patient Spontanous Breathing and Patient connected to face mask oxygen  Post-op Assessment: Report given to RN and Post -op Vital signs reviewed and stable  Post vital signs: Reviewed and stable  Last Vitals:  Vitals:   11/18/16 0828 11/18/16 0829  BP:    Pulse: (!) 59 60  Resp: 19 17  Temp:    SpO2: 100% 100%    Last Pain:  Vitals:   11/18/16 0734  TempSrc: Oral  PainSc: 0-No pain         Complications: No apparent anesthesia complications

## 2016-11-18 NOTE — Anesthesia Procedure Notes (Signed)
Procedure Name: Intubation Performed by: Terrance Mass Pre-anesthesia Checklist: Patient identified, Emergency Drugs available, Suction available and Patient being monitored Patient Re-evaluated:Patient Re-evaluated prior to induction Oxygen Delivery Method: Circle system utilized Preoxygenation: Pre-oxygenation with 100% oxygen Induction Type: IV induction Ventilation: Mask ventilation without difficulty Laryngoscope Size: Miller and 2 Grade View: Grade II Tube type: Oral Tube size: 7.0 mm Number of attempts: 1 Airway Equipment and Method: Stylet and Oral airway Placement Confirmation: ETT inserted through vocal cords under direct vision,  positive ETCO2 and breath sounds checked- equal and bilateral Secured at: 23 cm Tube secured with: Tape Dental Injury: Teeth and Oropharynx as per pre-operative assessment

## 2016-11-21 ENCOUNTER — Encounter (HOSPITAL_BASED_OUTPATIENT_CLINIC_OR_DEPARTMENT_OTHER): Payer: Self-pay | Admitting: General Surgery

## 2016-11-25 ENCOUNTER — Ambulatory Visit (INDEPENDENT_AMBULATORY_CARE_PROVIDER_SITE_OTHER): Payer: Medicare Other | Admitting: *Deleted

## 2016-11-25 DIAGNOSIS — Z7901 Long term (current) use of anticoagulants: Secondary | ICD-10-CM

## 2016-11-25 DIAGNOSIS — Z952 Presence of prosthetic heart valve: Secondary | ICD-10-CM | POA: Diagnosis not present

## 2016-11-25 DIAGNOSIS — Z5181 Encounter for therapeutic drug level monitoring: Secondary | ICD-10-CM

## 2016-11-25 LAB — POCT INR: INR: 1.5

## 2016-11-25 NOTE — Progress Notes (Signed)
Take coumadin 10 mg today Nov 9th and take coumadin 10 mg on Nov 10th then continue the same dose of Coumadin 7.5mg  every day except 10 mg on Wednesdays.  . Coumadin Clinic # [phone].Call with any concerns  Recheck INR in 1 week

## 2016-12-02 ENCOUNTER — Ambulatory Visit (INDEPENDENT_AMBULATORY_CARE_PROVIDER_SITE_OTHER): Payer: Medicare Other | Admitting: Pharmacist

## 2016-12-02 DIAGNOSIS — Z7901 Long term (current) use of anticoagulants: Secondary | ICD-10-CM

## 2016-12-02 DIAGNOSIS — Z5181 Encounter for therapeutic drug level monitoring: Secondary | ICD-10-CM | POA: Diagnosis not present

## 2016-12-02 DIAGNOSIS — Z952 Presence of prosthetic heart valve: Secondary | ICD-10-CM

## 2016-12-02 LAB — POCT INR: INR: 2.2

## 2016-12-02 NOTE — Patient Instructions (Signed)
Take 10mg  today and tomorrow, then continue the same dose of Coumadin 7.5mg  every day except 10 mg on Wednesdays. Coumadin Clinic # [phone].Call with any concerns  Recheck INR in 2 weeks.

## 2016-12-16 ENCOUNTER — Ambulatory Visit (INDEPENDENT_AMBULATORY_CARE_PROVIDER_SITE_OTHER): Payer: Medicare Other | Admitting: *Deleted

## 2016-12-16 DIAGNOSIS — Z7901 Long term (current) use of anticoagulants: Secondary | ICD-10-CM | POA: Diagnosis not present

## 2016-12-16 DIAGNOSIS — Z5181 Encounter for therapeutic drug level monitoring: Secondary | ICD-10-CM

## 2016-12-16 DIAGNOSIS — Z952 Presence of prosthetic heart valve: Secondary | ICD-10-CM

## 2016-12-16 LAB — POCT INR: INR: 2.8

## 2016-12-16 NOTE — Patient Instructions (Signed)
Continue the same dose of Coumadin 7.5mg  every day except 10 mg on Wednesdays. Coumadin Clinic # [phone].Call with any concerns  Recheck INR in 2 weeks with MD appt.

## 2016-12-23 NOTE — Progress Notes (Deleted)
David Benton was seen today in the movement disorders clinic for neurologic consultation at the request of David Morale, MD.  This patient is accompanied in the office by his spouse who supplements the history.  The consultation is for the evaluation of tremor.  06/27/16 update:  Patient seen today in follow-up for parkinsonism.  He is on no medication.  Pt denies falls.  Pt denies lightheadedness, near syncope.  No hallucinations.  Mood has been good.  CT brain done and reviewed since last visit and was non acute.  He had a carotid ultrasound on 04/11/2016.  It was normal, with 1-39% stenosis bilaterally.  He is doing rock steady boxing and he enjoys that.  He is also doing [employer] cycle class.    12/27/16 update: Patient seen today for mild idiopathic Parkinson's disease.  The records that were made available to me were reviewed.   He is on no medication for PD, which has been his desire.  Pt denies falls.  Pt denies lightheadedness, near syncope.  No hallucinations.  Mood has been good.  He had a R inguinal hernia repair in November.   PREVIOUS MEDICATIONS: none to date  ALLERGIES:   Allergies  Allergen Reactions  . Codeine     REACTION: N/V    CURRENT MEDICATIONS:  Outpatient Encounter Medications as of 12/27/2016  Medication Sig  . acetaminophen (TYLENOL) 325 MG tablet Take 650 mg by mouth as needed. Reported on 04/21/2015  . atorvastatin (LIPITOR) 40 MG tablet Take 1 tablet (40 mg total) by mouth daily.  Marland Kitchen HYDROcodone-acetaminophen (NORCO) 5-325 MG tablet Take 1 tablet by mouth every 6 (six) hours as needed for moderate pain or severe pain.  . magnesium hydroxide (MILK OF MAGNESIA) 400 MG/5ML suspension Take 30 mLs by mouth daily as needed for indigestion. Reported on 04/21/2015  . Probiotic Product (PROBIOTIC-10 ULTIMATE) CAPS Take 1 capsule by mouth daily.  . Saline GEL Place into the nose daily as needed (as directed). Reported on 04/21/2015  . sodium chloride (OCEAN) 0.65 % SOLN  nasal spray Place 1 spray into both nostrils as needed for congestion. Reported on 04/21/2015  . triamcinolone cream (KENALOG) 0.1 % Apply 1 application topically 2 (two) times daily.  . TRIAMTERENE-HCTZ PO Take by mouth. 37.5-25 mg take 1 daily.   Marland Kitchen warfarin (COUMADIN) 5 MG tablet TAKE AS DIRECTED BY COUMADIN CLINIC FOR 30 DAYS   No facility-administered encounter medications on file as of 12/27/2016.     PAST MEDICAL HISTORY:   Past Medical History:  Diagnosis Date  . AVD (aortic valve disease) 02/25/2010   sees Dr. Loralie Benton   . External hemorrhoids without mention of complication   . Heart valve replaced by other means   . Long term (current) use of anticoagulants 09/15/2010  . Meniere's disease   . Other and unspecified hyperlipidemia   . Other constipation   . Parkinson disease ([place]) 02/2016   no meds  . Personal history of colonic polyps   . Personal history of malignant neoplasm of prostate   . Right inguinal hernia 11/18/2016  . Shingles   . SHINGLES 11/26/2009  . Thoracic aneurysm without mention of rupture    followed by Dr. Servando Benton with yearly CT scans   . Unspecified essential hypertension     PAST SURGICAL HISTORY:   Past Surgical History:  Procedure Laterality Date  . AORTIC VALVE REPLACEMENT  1991    st. jude's valve, per Dr. Servando Benton   . COLONOSCOPY  06-28-10   per Dr. Henrene Benton, benign polyps, no further scopes needed   . HEMORRHOID SURGERY    . INGUINAL HERNIA REPAIR Right 11/18/2016   Procedure: OPEN REPAIR RIGHT INGUINAL HERNIA WITH MESH;  Surgeon: David Skates, MD;  Location: [facility];  Service: General;  Laterality: Right;  . INSERTION OF MESH Right 11/18/2016   Procedure: INSERTION OF MESH;  Surgeon: David Skates, MD;  Location: [facility];  Service: General;  Laterality: Right;  . PROSTATECTOMY  1995   per Dr. Roni Benton   . TONSILLECTOMY      SOCIAL HISTORY:   Social History   Socioeconomic History  . Marital  status: Married    Spouse name: Not on file  . Number of children: 0  . Years of education: 55  . Highest education level: Not on file  Social Needs  . Financial resource strain: Not on file  . Food insecurity - worry: Not on file  . Food insecurity - inability: Not on file  . Transportation needs - medical: Not on file  . Transportation needs - non-medical: Not on file  Occupational History  . Occupation: retired    Comment: [employer]  Tobacco Use  . Smoking status: Former Smoker    Types: Cigarettes    Last attempt to quit: 02/06/1972    Years since quitting: 44.9  . Smokeless tobacco: Never Used  Substance and Sexual Activity  . Alcohol use: Yes    Alcohol/week: 4.2 oz    Types: 7 Standard drinks or equivalent per week    Comment: glass of wine daily  . Drug use: No  . Sexual activity: No  Other Topics Concern  . Not on file  Social History Narrative   HSG, a couple of years of college. Married '78 - . No children. Work - [employer] 24 years - retired.   Lives alone with wife and two dogs. ACP - Yes CPR, Yes - short-term mechanical ventilation. Does not want prolonged heroic measures in the face of irreversible disease. HCPOA - wife. Alternative is his brother: David Benton [facility].     FAMILY HISTORY:   Family Status  Relation Name Status  . Father  Deceased at age 73  . Brother  Deceased  . Brother  Deceased  . Mother  Deceased at age 30       CAD, MI-fatal, DM  . Brother  Deceased       DM  . Sister  Deceased       born 8  . Brother  Deceased  . Brother  Alive  . Sister  Deceased       born 2  . MGM  Deceased  . MGF  Other  . PGM  Deceased  . PGF  Deceased  . Neg Hx  (Not Specified)    ROS:  A complete 10 system review of systems was obtained and was unremarkable apart from what is mentioned above.  PHYSICAL EXAMINATION:    VITALS:   There were no vitals filed for this visit.  GEN:  The patient appears stated age and  is in NAD. HEENT:  Normocephalic, atraumatic.  The mucous membranes are moist. The superficial temporal arteries are without ropiness or tenderness. CV:  RRR Lungs:  CTAB Neck/HEME:  There are no carotid bruits bilaterally.  Neurological examination:  Orientation: The patient is alert and oriented x3. Fund of knowledge is appropriate.  Recent and remote memory  are intact.  Attention and concentration are normal.    Able to name objects and repeat phrases. Cranial nerves: There is good facial symmetry.   There is mild facial hypomimia.   Extraocular muscles are intact. The visual fields are full to confrontational testing. The speech is fluent and clear. Soft palate rises symmetrically and there is no tongue deviation. Hearing is intact to conversational tone. Sensation: Sensation is intact to light touch throughout. Motor: Strength is 5/5 in the bilateral upper and lower extremities.   Shoulder shrug is equal and symmetric.  There is no pronator drift.   Movement examination: Tone: There is mild increased tone in the bilateral upper extremities  (some trouble relaxing).  The tone in the lower extremities is normal.  Abnormal movements: There is LUE resting tremor Coordination:  There is good RAMs today without decremation Gait and Station: The patient has no difficulty arising out of a deep-seated chair without the use of the hands. The patient's stride length is good.  The patient has a negative pull test.      LABS:  Lab Results  Component Value Date   TSH 3.31 05/19/2015     Chemistry      Component Value Date/Time   NA 140 11/17/2016 1026   NA 142 07/08/2016 1449   K 4.0 11/17/2016 1026   CL 101 11/17/2016 1026   CO2 32 11/17/2016 1026   BUN 20 11/17/2016 1026   BUN 26 07/08/2016 1449   CREATININE 1.10 11/17/2016 1026   CREATININE 1.09 07/30/2014 1400      Component Value Date/Time   CALCIUM 9.6 11/17/2016 1026   ALKPHOS 80 11/17/2016 1026   AST 23 11/17/2016 1026   ALT  19 11/17/2016 1026   BILITOT 3.2 (H) 11/17/2016 1026   BILITOT 1.7 (H) 07/08/2016 1449       ASSESSMENT/PLAN:  1.  Parkinsonism.  I suspect that this does represent early idiopathic Parkinson's disease.    -offered meds again and talked about benefits of doing so but pt still wishes to hold on that for now and we can readdress in the near future  -We discussed community resources in the area including patient support groups and community exercise programs for PD and pt education was provided to the patient.  Pt involved with [employer] cycle and rock steady and congratulated him on that.   2.  Follow up is anticipated in the next 6 months, sooner should new neurologic issues arise.  Much greater than 50% of this visit was spent in counseling and coordinating care.  Total face to face time:  25 min     Cc:  David Morale, MD

## 2016-12-27 ENCOUNTER — Ambulatory Visit: Payer: Medicare Other | Admitting: Neurology

## 2016-12-28 ENCOUNTER — Encounter: Payer: Self-pay | Admitting: Internal Medicine

## 2016-12-30 ENCOUNTER — Encounter: Payer: Self-pay | Admitting: Internal Medicine

## 2016-12-30 ENCOUNTER — Ambulatory Visit: Payer: Medicare Other | Admitting: Internal Medicine

## 2016-12-30 ENCOUNTER — Ambulatory Visit (INDEPENDENT_AMBULATORY_CARE_PROVIDER_SITE_OTHER): Payer: Medicare Other | Admitting: *Deleted

## 2016-12-30 VITALS — BP 140/78 | HR 73 | Ht 70.0 in | Wt 145.8 lb

## 2016-12-30 DIAGNOSIS — I6523 Occlusion and stenosis of bilateral carotid arteries: Secondary | ICD-10-CM | POA: Diagnosis not present

## 2016-12-30 DIAGNOSIS — I712 Thoracic aortic aneurysm, without rupture, unspecified: Secondary | ICD-10-CM

## 2016-12-30 DIAGNOSIS — Z7901 Long term (current) use of anticoagulants: Secondary | ICD-10-CM | POA: Diagnosis not present

## 2016-12-30 DIAGNOSIS — I359 Nonrheumatic aortic valve disorder, unspecified: Secondary | ICD-10-CM | POA: Diagnosis not present

## 2016-12-30 DIAGNOSIS — E785 Hyperlipidemia, unspecified: Secondary | ICD-10-CM | POA: Diagnosis not present

## 2016-12-30 DIAGNOSIS — Z952 Presence of prosthetic heart valve: Secondary | ICD-10-CM | POA: Diagnosis not present

## 2016-12-30 DIAGNOSIS — I10 Essential (primary) hypertension: Secondary | ICD-10-CM | POA: Diagnosis not present

## 2016-12-30 DIAGNOSIS — Z5181 Encounter for therapeutic drug level monitoring: Secondary | ICD-10-CM | POA: Diagnosis not present

## 2016-12-30 LAB — POCT INR: INR: 2.7

## 2016-12-30 NOTE — Patient Instructions (Signed)
Medication Instructions:  Your physician recommends that you continue on your current medications as directed. Please refer to the Current Medication list given to you today.    Labwork: None   Testing/Procedures: None   Follow-Up: Your physician wants you to follow-up in: 1 year with Dr End. (December 2019).  You will receive a reminder letter in the mail two months in advance. If you don't receive a letter, please call our office to schedule the follow-up appointment.        If you need a refill on your cardiac medications before your next appointment, please call your pharmacy.

## 2016-12-30 NOTE — Patient Instructions (Signed)
Description   Continue the same dose of Coumadin 7.5mg  every day except 10 mg on Wednesdays. Coumadin Clinic # [phone].Call with any concerns  Recheck INR in 4 weeks.

## 2016-12-30 NOTE — Progress Notes (Signed)
Follow-up Outpatient Visit Date: 12/30/2016  Primary Care Provider: [name], MD [address] [place] [address]  Chief Complaint: Follow-up valve disease  HPI:  [name] is a 83 y.o. year-old male with history of bicuspid aortic valve status post mechanical AVR in 1991 ([employer]), thoracic aortic aneurysm followed by [name], carotid artery stenosis, hypertension, hyperlipidemia, and Parkinson's' disea, who presents for follow-up of aortic valve disease I last saw him in June, at which time he was doing well with the exception of continued balance issues. He underwent right inguinal hernia repair in November without incident. Today, [name] reports feeling well, denying chest pain, shortness of breath, palpitations, lightheadedness, and edema. His balance is stable to slightly improved since starting "rock-steady boxing." He has not had any falls or bleeding, remaining on warfarin. He notes spontaneous left forearm hematoma when aspirin was added to warfarin a few years ago. He uses amoxicillin prior to any dental procedure.  --------------------------------------------------------------------------------------------------  Past Medical History:  Diagnosis Date  . AVD (aortic valve disease) 02/25/2010   sees Dr. Loralie Benton   . External hemorrhoids without mention of complication   . Heart valve replaced by other means   . Long term (current) use of anticoagulants 09/15/2010  . Meniere's disease   . Other and unspecified hyperlipidemia   . Other constipation   . Parkinson disease ([place]) 02/2016   no meds  . Personal history of colonic polyps   . Personal history of malignant neoplasm of prostate   . Right inguinal hernia 11/18/2016  . Shingles   . SHINGLES 11/26/2009  . Thoracic aneurysm without mention of rupture    followed by [name] with yearly CT scans   . Unspecified essential hypertension    Past Surgical History:  Procedure Laterality  Date  . AORTIC VALVE REPLACEMENT  1991    st. jude's valve, per [name]   . COLONOSCOPY  06-28-10   per Dr. Henrene Pastor, benign polyps, no further scopes needed   . HEMORRHOID SURGERY    . INGUINAL HERNIA REPAIR Right 11/18/2016   Procedure: OPEN REPAIR RIGHT INGUINAL HERNIA WITH MESH;  Surgeon: David Skates, MD;  Location: [facility];  Service: General;  Laterality: Right;  . INSERTION OF MESH Right 11/18/2016   Procedure: INSERTION OF MESH;  Surgeon: David Skates, MD;  Location: [facility];  Service: General;  Laterality: Right;  . PROSTATECTOMY  1995   per Dr. Roni Benton   . TONSILLECTOMY      Current Meds  Medication Sig  . acetaminophen (TYLENOL) 325 MG tablet Take 650 mg by mouth as needed. Reported on 04/21/2015  . atorvastatin (LIPITOR) 40 MG tablet Take 1 tablet (40 mg total) by mouth daily.  Marland Kitchen HYDROcodone-acetaminophen (NORCO) 5-325 MG tablet Take 1 tablet by mouth every 6 (six) hours as needed for moderate pain or severe pain.  . magnesium hydroxide (MILK OF MAGNESIA) 400 MG/5ML suspension Take 30 mLs by mouth daily as needed for indigestion. Reported on 04/21/2015  . Probiotic Product (PROBIOTIC-10 ULTIMATE) CAPS Take 1 capsule by mouth daily.  . Saline GEL Place into the nose daily as needed (as directed). Reported on 04/21/2015  . sodium chloride (OCEAN) 0.65 % SOLN nasal spray Place 1 spray into both nostrils as needed for congestion. Reported on 04/21/2015  . triamcinolone cream (KENALOG) 0.1 % Apply 1 application topically 2 (two) times daily.  . TRIAMTERENE-HCTZ PO Take by mouth. 37.5-25 mg take 1 daily.   Marland Kitchen  warfarin (COUMADIN) 5 MG tablet TAKE AS DIRECTED BY COUMADIN CLINIC FOR 30 DAYS    Allergies: Codeine  Social History   Socioeconomic History  . Marital status: Married    Spouse name: Not on file  . Number of children: 0  . Years of education: 59  . Highest education level: Not on file  Social Needs  . Financial resource strain: Not on  file  . Food insecurity - worry: Not on file  . Food insecurity - inability: Not on file  . Transportation needs - medical: Not on file  . Transportation needs - non-medical: Not on file  Occupational History  . Occupation: retired    Comment: [employer]  Tobacco Use  . Smoking status: Former Smoker    Types: Cigarettes    Last attempt to quit: 02/06/1972    Years since quitting: 44.9  . Smokeless tobacco: Never Used  Substance and Sexual Activity  . Alcohol use: Yes    Alcohol/week: 4.2 oz    Types: 7 Standard drinks or equivalent per week    Comment: glass of wine daily  . Drug use: No  . Sexual activity: No  Other Topics Concern  . Not on file  Social History Narrative   HSG, a couple of years of college. Married '78 - . No children. Work - [employer] 105 years - retired.   Lives alone with wife and two dogs. ACP - Yes CPR, Yes - short-term mechanical ventilation. Does not want prolonged heroic measures in the face of irreversible disease. HCPOA - wife. Alternative is his brother: David Benton [facility].     Family History  Problem Relation Age of Onset  . Heart attack Father 54       fatal at 79  . Hypertension Father   . CAD Father   . Other Father        possible PD as says had "palsy"  . Prostate cancer Brother   . Diabetes Brother   . Heart attack Mother   . Diabetes Brother   . Stroke Brother   . Heart disease Brother        carotid artery disease  . Hypertension Brother   . Breast cancer Sister        mastectomy at 51  . Colon cancer Neg Hx     Review of Systems: A 12-system review of systems was performed and was negative except as noted in the HPI.  --------------------------------------------------------------------------------------------------  Physical Exam: BP 140/78   Pulse 73   Ht 5\' 10"  (1.778 m)   Wt 145 lb 12 oz (66.1 kg)   SpO2 98%   BMI 20.91 kg/m   General:  Thin elderly man, seated comfortably in the exam  room. HEENT: No conjunctival pallor or scleral icterus. Moist mucous membranes.  OP clear. Neck: Supple without lymphadenopathy, thyromegaly, JVD, or HJR. Lungs: Normal work of breathing. Clear to auscultation bilaterally without wheezes or crackles. Heart: Regular rate and rhythm with 2/6 systolic crescendo-decrescendo murmur loudest at the RUSB. No rubs or gallops. Crisp, mechanical S2 noted. Non-displaced PMI. Abd: Bowel sounds present. Soft, NT/ND without hepatosplenomegaly Ext: No lower extremity edema. Radial, PT, and DP pulses are 2+ bilaterally. Skin: Warm and dry without rash.  Lab Results  Component Value Date   WBC 3.6 (L) 11/17/2016   HGB 13.4 11/17/2016   HCT 40.8 11/17/2016   MCV 91.7 11/17/2016   PLT 198 11/17/2016    Lab Results  Component Value Date   NA 140 11/17/2016   K 4.0 11/17/2016   CL 101 11/17/2016   CO2 32 11/17/2016   BUN 20 11/17/2016   CREATININE 1.10 11/17/2016   GLUCOSE 108 (H) 11/17/2016   ALT 19 11/17/2016    Lab Results  Component Value Date   CHOL 136 07/08/2016   HDL 49 07/08/2016   LDLCALC 63 07/08/2016   TRIG 120 07/08/2016   CHOLHDL 2.8 07/08/2016    --------------------------------------------------------------------------------------------------  ASSESSMENT AND PLAN: History of bicuspid aortic valve status post mechanical AVR No symptoms of valve dysfunction or embolism. Continue warfarin. Due to prior spontaneous forearm hematoma with addition of aspirin, we will not rechallenge Mr. Mikami. Use of prophylactic antibiotics for any dental procedure was reiterated.  TAA Stable. Continue f/u with [name].  Carotid artery stenosis No new symptoms. Plan for repeat carotid Dopplers in the spring.  Hypertension BP mildly elevated today but well-controlled at previous visits. We will defer changing medications today.  Hyperlipdemia LDL 63 in June (goal < 70). Continue atorvastatin 40 mg daily.  Follow-up: Return to clinic  in 1 year.  Nelva Bush, MD 12/30/2016 9:51 AM

## 2016-12-31 ENCOUNTER — Encounter: Payer: Self-pay | Admitting: Internal Medicine

## 2017-01-02 ENCOUNTER — Other Ambulatory Visit: Payer: Self-pay | Admitting: Cardiology

## 2017-01-02 ENCOUNTER — Other Ambulatory Visit: Payer: Self-pay | Admitting: *Deleted

## 2017-01-02 DIAGNOSIS — I6523 Occlusion and stenosis of bilateral carotid arteries: Secondary | ICD-10-CM

## 2017-01-16 ENCOUNTER — Other Ambulatory Visit: Payer: Self-pay | Admitting: Internal Medicine

## 2017-01-16 NOTE — Telephone Encounter (Signed)
Please review for refill, Thanks !  

## 2017-01-25 ENCOUNTER — Ambulatory Visit (INDEPENDENT_AMBULATORY_CARE_PROVIDER_SITE_OTHER): Payer: Medicare Other | Admitting: Pharmacist

## 2017-01-25 DIAGNOSIS — Z952 Presence of prosthetic heart valve: Secondary | ICD-10-CM

## 2017-01-25 DIAGNOSIS — Z7901 Long term (current) use of anticoagulants: Secondary | ICD-10-CM | POA: Diagnosis not present

## 2017-01-25 DIAGNOSIS — Z5181 Encounter for therapeutic drug level monitoring: Secondary | ICD-10-CM | POA: Diagnosis not present

## 2017-01-25 LAB — POCT INR: INR: 2.7

## 2017-01-25 NOTE — Patient Instructions (Signed)
Description   Continue the same dose of Coumadin 7.5mg every day except 10 mg on Wednesdays. Cou[facility]in Clinic # [phone].Call with any concerns  Recheck INR in 6 weeks.       

## 2017-01-25 NOTE — Progress Notes (Signed)
David Benton was seen today in the movement disorders clinic for neurologic consultation at the request of [name], MD.  This patient is accompanied in the office by his spouse who supplements the history.  The consultation is for the evaluation of tremor.  06/27/16 update:  Patient seen today in follow-up for parkinsonism.  He is on no medication.  Pt denies falls.  Pt denies lightheadedness, near syncope.  No hallucinations.  Mood has been good.  CT brain done and reviewed since last visit and was non acute.  He had a carotid ultrasound on 04/11/2016.  It was normal, with 1-39% stenosis bilaterally.  He is doing rock steady boxing and he enjoys that.  He is also doing [employer] cycle class.    01/26/17 update: Patient seen today for mild idiopathic Parkinson's disease.  The records that were made available to me were reviewed.   He is on no medication for PD, which has been his desire.  Pt denies falls.  Pt denies lightheadedness, near syncope.  No hallucinations.  Mood has been good.  He had a R inguinal hernia repair in November.  He had to hold exercise for 6 weeks but he is back to exercise now.  He does RSB 2 days per week.  He is also walking for exercise.  PREVIOUS MEDICATIONS: none to date  ALLERGIES:   Allergies  Allergen Reactions  . Codeine     REACTION: N/V    CURRENT MEDICATIONS:  Outpatient Encounter Medications as of 01/26/2017  Medication Sig  . acetaminophen (TYLENOL) 325 MG tablet Take 650 mg by mouth as needed. Reported on 04/21/2015  . atorvastatin (LIPITOR) 40 MG tablet TAKE 1 TABLET BY MOUTH EVERY DAY  . magnesium hydroxide (MILK OF MAGNESIA) 400 MG/5ML suspension Take 30 mLs by mouth daily as needed for indigestion. Reported on 04/21/2015  . Probiotic Product (PROBIOTIC-10 ULTIMATE) CAPS Take 1 capsule by mouth daily.  . sodium chloride (OCEAN) 0.65 % SOLN nasal spray Place 1 spray into both nostrils as needed for congestion. Reported on 04/21/2015  . triamcinolone  cream (KENALOG) 0.1 % Apply 1 application topically 2 (two) times daily.  . TRIAMTERENE-HCTZ PO Take by mouth. 37.5-25 mg take 1 daily.   Marland Kitchen warfarin (COUMADIN) 5 MG tablet TAKE AS DIRECTED BY COUMADIN CLINIC FOR 30 DAYS  . amoxicillin (AMOXIL) 500 MG capsule TAKE FOUR CAPSULES BY MOUTH ONE HOUR PRIOR TO DENTAL APPOINTMENT AS DIRECTED. (Patient not taking: Reported on 01/26/2017)  . [DISCONTINUED] HYDROcodone-acetaminophen (NORCO) 5-325 MG tablet Take 1 tablet by mouth every 6 (six) hours as needed for moderate pain or severe pain.  . [DISCONTINUED] Saline GEL Place into the nose daily as needed (as directed). Reported on 04/21/2015   No facility-administered encounter medications on file as of 01/26/2017.     PAST MEDICAL HISTORY:   Past Medical History:  Diagnosis Date  . AVD (aortic valve disease) 02/25/2010   sees [name]   . External hemorrhoids without mention of complication   . Heart valve replaced by other means   . Long term (current) use of anticoagulants 09/15/2010  . Meniere's disease   . Other and unspecified hyperlipidemia   . Other constipation   . Parkinson disease ([place]) 02/2016   no meds  . Personal history of colonic polyps   . Personal history of malignant neoplasm of prostate   . Right inguinal hernia 11/18/2016  . Shingles   . SHINGLES 11/26/2009  . Thoracic aneurysm without  mention of rupture    followed by [name] with yearly CT scans   . Unspecified essential hypertension     PAST SURGICAL HISTORY:   Past Surgical History:  Procedure Laterality Date  . AORTIC VALVE REPLACEMENT  1991    st. jude's valve, per [name]   . COLONOSCOPY  06-28-10   per Dr. Henrene Pastor, benign polyps, no further scopes needed   . HEMORRHOID SURGERY    . INGUINAL HERNIA REPAIR Right 11/18/2016   Procedure: OPEN REPAIR RIGHT INGUINAL HERNIA WITH MESH;  Surgeon: Fanny Skates, MD;  Location: [facility];  Service: General;  Laterality: Right;  . INSERTION  OF MESH Right 11/18/2016   Procedure: INSERTION OF MESH;  Surgeon: Fanny Skates, MD;  Location: [facility];  Service: General;  Laterality: Right;  . PROSTATECTOMY  1995   per Dr. Roni Bread   . TONSILLECTOMY      SOCIAL HISTORY:   Social History   Socioeconomic History  . Marital status: Married    Spouse name: Not on file  . Number of children: 0  . Years of education: 60  . Highest education level: Not on file  Social Needs  . Financial resource strain: Not on file  . Food insecurity - worry: Not on file  . Food insecurity - inability: Not on file  . Transportation needs - medical: Not on file  . Transportation needs - non-medical: Not on file  Occupational History  . 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 Smoking status: Former Smoker    Types: Cigarettes    Last attempt to quit: 02/06/1972    Years since quitting: 45.0  . Smokeless tobacco: Never Used  Substance and Sexual Activity  . Alcohol use: Yes    Alcohol/week: 4.2 oz    Types: 7 Standard drinks or equivalent per week    Comment: glass of wine daily  . Drug use: No  . Sexual activity: No  Other Topics Concern  . Not on file  Social History Narrative   HSG, a couple of years of college. Married '78 - . No children. Work - [employer] 60 years - retired.   Lives alone with wife and two dogs. ACP - Yes CPR, Yes - short-term mechanical ventilation. Does not want prolonged heroic measures in the face of irreversible disease. HCPOA - wife. Alternative is his brother: Candace Ramus [facility].     FAMILY HISTORY:   Family Status  Relation Name Status  . Father  Deceased at age 77  . Brother  Deceased  . Brother  Deceased  . Mother  Deceased at age 82       CAD, MI-fatal, DM  . Brother  Deceased       DM  . Sister  Deceased       born 10  . Brother  Deceased  . Brother  Alive  . Sister  Deceased       born 66  . MGM  Deceased  . MGF  Other  . PGM   Deceased  . PGF  Deceased  . Neg Hx  (Not Specified)    ROS:  A complete 10 system review of systems was obtained and was unremarkable apart from what is mentioned above.  PHYSICAL EXAMINATION:    VITALS:   Vitals:   01/26/17 1117  BP: (!) 144/72  Pulse: 66  SpO2: 99%  Weight: 145  lb (65.8 kg)  Height: 5\' 10"  (1.778 m)    GEN:  The patient appears stated age and is in NAD. HEENT:  Normocephalic, atraumatic.  The mucous membranes are moist. The superficial temporal arteries are without ropiness or tenderness. CV:  RRR Lungs:  CTAB Neck/HEME:  There are no carotid bruits bilaterally.  Neurological examination:  Orientation: The patient is alert and oriented x3.  Cranial nerves: There is good facial symmetry.   There is mild facial hypomimia.   Extraocular muscles are intact. The visual fields are full to confrontational testing. The speech is fluent and clear. Soft palate rises symmetrically and there is no tongue deviation. Hearing is intact to conversational tone. Sensation: Sensation is intact to light touch throughout. Motor: Strength is antigravity x 4.     Movement examination: Tone: There is mild to mod increased tone in the LUE Abnormal movements: He has LUE/LLE resting tremor Coordination:  There is slight decremation with finger taps and hand opening and closing on the left.  Other of rapid alternating movements were good. Gait and Station: The patient has no difficulty arising out of a deep-seated chair without the use of the hands. The patient's stride length is good.  The patient has a negative pull test.      LABS:  Lab Results  Component Value Date   TSH 3.31 05/19/2015     Chemistry      Component Value Date/Time   NA 140 11/17/2016 1026   NA 142 07/08/2016 1449   K 4.0 11/17/2016 1026   CL 101 11/17/2016 1026   CO2 32 11/17/2016 1026   BUN 20 11/17/2016 1026   BUN 26 07/08/2016 1449   CREATININE 1.10 11/17/2016 1026   CREATININE 1.09 07/30/2014  1400      Component Value Date/Time   CALCIUM 9.6 11/17/2016 1026   ALKPHOS 80 11/17/2016 1026   AST 23 11/17/2016 1026   ALT 19 11/17/2016 1026   BILITOT 3.2 (H) 11/17/2016 1026   BILITOT 1.7 (H) 07/08/2016 1449       ASSESSMENT/PLAN:  1.  Parkinsonism.  I suspect that this does represent early idiopathic Parkinson's disease.    -I talked to him about trying carbidopa/levodopa 25/100 as he does feel a bit more rigid to me.  He and I discussed QOL studies that indicate that patients on medication feel better than those who don't.  After a long discussion about risks and benefits and side effects, including how one takes the medication, he ultimately declined to take the medication for now.  He and his wife asked multiple questions and I answered them to the best of my ability.  -We discussed the Parkinson's symposium that will Take Pl. in July at [place].  2.  He will follow-up with me in 6 months, sooner should new neurologic issues arise. Much greater than 50% of this visit was spent in counseling and coordinating care.  Total face to face time:  25 min     Cc:  [name], MD

## 2017-01-26 ENCOUNTER — Encounter: Payer: Self-pay | Admitting: Neurology

## 2017-01-26 ENCOUNTER — Ambulatory Visit: Payer: Medicare Other | Admitting: Neurology

## 2017-01-26 VITALS — BP 144/72 | HR 66 | Ht 70.0 in | Wt 145.0 lb

## 2017-01-26 DIAGNOSIS — G20 Parkinson's disease: Secondary | ICD-10-CM | POA: Diagnosis not present

## 2017-01-26 NOTE — Patient Instructions (Signed)
Good to see you   

## 2017-02-15 ENCOUNTER — Encounter: Payer: Self-pay | Admitting: Family Medicine

## 2017-02-15 ENCOUNTER — Ambulatory Visit (INDEPENDENT_AMBULATORY_CARE_PROVIDER_SITE_OTHER): Payer: Medicare Other | Admitting: Family Medicine

## 2017-02-15 VITALS — BP 112/64 | HR 70 | Temp 98.0°F | Ht 69.0 in | Wt 145.6 lb

## 2017-02-15 DIAGNOSIS — Z8546 Personal history of malignant neoplasm of prostate: Secondary | ICD-10-CM

## 2017-02-15 DIAGNOSIS — I359 Nonrheumatic aortic valve disorder, unspecified: Secondary | ICD-10-CM

## 2017-02-15 DIAGNOSIS — I10 Essential (primary) hypertension: Secondary | ICD-10-CM | POA: Diagnosis not present

## 2017-02-15 DIAGNOSIS — E785 Hyperlipidemia, unspecified: Secondary | ICD-10-CM | POA: Diagnosis not present

## 2017-02-15 DIAGNOSIS — G20 Parkinson's disease: Secondary | ICD-10-CM | POA: Diagnosis not present

## 2017-02-15 DIAGNOSIS — G20A1 Parkinson's disease without dyskinesia, without mention of fluctuations: Secondary | ICD-10-CM

## 2017-02-15 DIAGNOSIS — K5909 Other constipation: Secondary | ICD-10-CM

## 2017-02-15 NOTE — Progress Notes (Signed)
   Subjective:    Patient ID: David Benton, male    DOB: 1929/03/01, 84 y.o.   MRN: [id number]  HPI Here to follow up on issues. He feels well and has no concerns. He sees [name] every 6 months for Parkinsons and so far has been able to avoid medications. He stays active and he participates in the boxing therapy group. His BP is stable. He gets regular Coumadin checks.    Review of Systems  Constitutional: Negative.   HENT: Negative.   Eyes: Negative.   Respiratory: Negative.   Cardiovascular: Negative.   Gastrointestinal: Negative.   Genitourinary: Negative.   Musculoskeletal: Negative.   Skin: Negative.   Neurological: Positive for tremors. Negative for dizziness, seizures, syncope, facial asymmetry, speech difficulty, weakness, light-headedness, numbness and headaches.  Psychiatric/Behavioral: Negative.        Objective:   Physical Exam  Constitutional: He is oriented to person, place, and time. He appears well-developed and well-nourished. No distress.  HENT:  Head: Normocephalic and atraumatic.  Right Ear: External ear normal.  Left Ear: External ear normal.  Nose: Nose normal.  Mouth/Throat: Oropharynx is clear and moist. No oropharyngeal exudate.  Eyes: Conjunctivae and EOM are normal. Pupils are equal, round, and reactive to light. Right eye exhibits no discharge. Left eye exhibits no discharge. No scleral icterus.  Neck: Neck supple. No JVD present. No tracheal deviation present. No thyromegaly present.  Cardiovascular: Normal rate, regular rhythm, normal heart sounds and intact distal pulses. Exam reveals no gallop and no friction rub.  No murmur heard. Pulmonary/Chest: Effort normal and breath sounds normal. No respiratory distress. He has no wheezes. He has no rales. He exhibits no tenderness.  Abdominal: Soft. Bowel sounds are normal. He exhibits no distension and no mass. There is no tenderness. There is no rebound and no guarding.  Genitourinary: Rectum normal  and penis normal. Rectal exam shows guaiac negative stool. No penile tenderness.  Genitourinary Comments: Prostate is absent   Musculoskeletal: Normal range of motion. He exhibits no edema or tenderness.  Lymphadenopathy:    He has no cervical adenopathy.  Neurological: He is alert and oriented to person, place, and time. He has normal reflexes. No cranial nerve deficit. He exhibits normal muscle tone. Coordination normal.  Skin: Skin is warm and dry. No rash noted. He is not diaphoretic. No erythema. No pallor.  Psychiatric: He has a normal mood and affect. His behavior is normal. Judgment and thought content normal.          Assessment & Plan:  His Parkinsons disease is stable. He is doing well S/P aortic valve replacement. His inguinal hernia repair last year went well. He will return soon for fasting labs to check lipids, etc.  Alysia Penna, MD

## 2017-02-16 LAB — LIPID PANEL
CHOLESTEROL: 126 mg/dL (ref 0–200)
HDL: 43.7 mg/dL (ref >39.00)
LDL Cholesterol: 70 mg/dL (ref 0–99)
NonHDL: 82.69
TRIGLYCERIDES: 64 mg/dL (ref 0.0–149.0)
Total CHOL/HDL Ratio: 3
VLDL: 12.8 mg/dL (ref 0.0–40.0)

## 2017-02-16 LAB — CBC WITH DIFFERENTIAL/PLATELET
Basophils Absolute: 0 10*3/uL (ref 0.0–0.1)
Basophils Relative: 0.8 % (ref 0.0–3.0)
EOS ABS: 0.2 10*3/uL (ref 0.0–0.7)
EOS PCT: 3.7 % (ref 0.0–5.0)
HCT: 41.1 % (ref 39.0–52.0)
HEMOGLOBIN: 14.3 g/dL (ref 13.0–17.0)
LYMPHS PCT: 26.8 % (ref 12.0–46.0)
Lymphs Abs: 1.1 10*3/uL (ref 0.7–4.0)
MCHC: 34.8 g/dL (ref 30.0–36.0)
MCV: 89.1 fl (ref 78.0–100.0)
Monocytes Absolute: 0.5 10*3/uL (ref 0.1–1.0)
Monocytes Relative: 12.2 % — ABNORMAL HIGH (ref 3.0–12.0)
Neutro Abs: 2.3 10*3/uL (ref 1.4–7.7)
Neutrophils Relative %: 56.5 % (ref 43.0–77.0)
Platelets: 207 10*3/uL (ref 150.0–400.0)
RBC: 4.61 Mil/uL (ref 4.22–5.81)
RDW: 14.1 % (ref 11.5–15.5)
WBC: 4.1 10*3/uL (ref 4.0–10.5)

## 2017-02-16 LAB — POC URINALSYSI DIPSTICK (AUTOMATED)
Bilirubin, UA: NEGATIVE
Blood, UA: NEGATIVE
Glucose, UA: NEGATIVE
Ketones, UA: NEGATIVE
LEUKOCYTES UA: NEGATIVE
Nitrite, UA: NEGATIVE
PROTEIN UA: NEGATIVE
Spec Grav, UA: 1.015 (ref 1.010–1.025)
UROBILINOGEN UA: 0.2 U/dL
pH, UA: 8 (ref 5.0–8.0)

## 2017-02-16 LAB — BASIC METABOLIC PANEL
BUN: 21 mg/dL (ref 6–23)
CHLORIDE: 102 meq/L (ref 96–112)
CO2: 34 mEq/L — ABNORMAL HIGH (ref 19–32)
Calcium: 9.2 mg/dL (ref 8.4–10.5)
Creatinine, Ser: 1.06 mg/dL (ref 0.40–1.50)
GFR: 70.48 mL/min (ref >60.00)
GLUCOSE: 101 mg/dL — AB (ref 70–99)
Potassium: 4.3 mEq/L (ref 3.5–5.1)
Sodium: 140 mEq/L (ref 135–145)

## 2017-02-16 LAB — HEPATIC FUNCTION PANEL
ALK PHOS: 89 U/L (ref 39–117)
ALT: 20 U/L (ref 0–53)
AST: 19 U/L (ref 0–37)
Albumin: 4.3 g/dL (ref 3.5–5.2)
BILIRUBIN DIRECT: 0.4 mg/dL — AB (ref 0.0–0.3)
BILIRUBIN TOTAL: 1.8 mg/dL — AB (ref 0.2–1.2)
Total Protein: 6.5 g/dL (ref 6.0–8.3)

## 2017-02-16 LAB — PSA: PSA: 0.04 ng/mL — ABNORMAL LOW (ref 0.10–4.00)

## 2017-02-16 LAB — TSH: TSH: 5.62 u[IU]/mL — AB (ref 0.35–4.50)

## 2017-02-16 NOTE — Addendum Note (Signed)
Addended by: Elmer Picker on: 02/16/2017 08:17 AM   Modules accepted: Orders

## 2017-02-22 ENCOUNTER — Telehealth: Payer: Self-pay | Admitting: Family Medicine

## 2017-02-22 ENCOUNTER — Other Ambulatory Visit: Payer: Self-pay

## 2017-02-22 MED ORDER — LEVOTHYROXINE SODIUM 75 MCG PO TABS: 75.0000 ug | ORAL_TABLET | Freq: Every day | ORAL | 3 refills | Status: DC

## 2017-02-22 NOTE — Telephone Encounter (Signed)
Call in Synthroid 75 mcg to take daily, #90 with 3 rf.  rx has been sent. Pt advised that this has been done and to follow up in three months.

## 2017-02-22 NOTE — Telephone Encounter (Signed)
Copied from [place] [id number]. Topic: General - Other >> Feb 22, 2017  1:48 PM Cecelia Byars, NT wrote: Reason for CRM: Patient would like a call from Dr Sarajane Jews "s nurse she called to give him results from thyroid he would like to see if the generic synthroid can be called in please advise [phone]

## 2017-02-23 NOTE — Telephone Encounter (Signed)
Patient returned call from VM left, he asks if the synthroid prescription can be sent in generic so his co-pay will not be high, I asked did he check with the pharmacist to see if they filled the generic, he said "no." I called the [facility] to verify if the generic synthroid was filled on 02/22/17, I was told yes, it was generic and his co-pay is $4, I advised the patient the generic was filled with a $4 co-pay, he verbalized understanding.

## 2017-02-23 NOTE — Telephone Encounter (Signed)
Called pt and left a Vm to call back

## 2017-03-08 ENCOUNTER — Ambulatory Visit (INDEPENDENT_AMBULATORY_CARE_PROVIDER_SITE_OTHER): Payer: Medicare Other | Admitting: *Deleted

## 2017-03-08 DIAGNOSIS — Z5181 Encounter for therapeutic drug level monitoring: Secondary | ICD-10-CM

## 2017-03-08 LAB — POCT INR: INR: 2.7

## 2017-03-08 NOTE — Patient Instructions (Signed)
Description   Continue the same dose of Coumadin 7.5mg every day except 10 mg on Wednesdays. Cou[facility]in Clinic # [phone].Call with any concerns  Recheck INR in 6 weeks.       

## 2017-03-16 ENCOUNTER — Other Ambulatory Visit: Payer: Self-pay | Admitting: Cardiology

## 2017-03-30 DIAGNOSIS — X32XXXD Exposure to sunlight, subsequent encounter: Secondary | ICD-10-CM | POA: Diagnosis not present

## 2017-03-30 DIAGNOSIS — L570 Actinic keratosis: Secondary | ICD-10-CM | POA: Diagnosis not present

## 2017-04-11 ENCOUNTER — Ambulatory Visit (HOSPITAL_COMMUNITY)
Admission: RE | Admit: 2017-04-11 | Discharge: 2017-04-11 | Disposition: A | Discharge status: Home / Self Care | Payer: Medicare Other | Source: Ambulatory Visit | Attending: Cardiovascular Disease | Admitting: Cardiovascular Disease

## 2017-04-11 DIAGNOSIS — I6523 Occlusion and stenosis of bilateral carotid arteries: Secondary | ICD-10-CM | POA: Insufficient documentation

## 2017-04-19 ENCOUNTER — Ambulatory Visit (INDEPENDENT_AMBULATORY_CARE_PROVIDER_SITE_OTHER): Payer: Medicare Other | Admitting: *Deleted

## 2017-04-19 DIAGNOSIS — Z5181 Encounter for therapeutic drug level monitoring: Secondary | ICD-10-CM | POA: Diagnosis not present

## 2017-04-19 DIAGNOSIS — Z952 Presence of prosthetic heart valve: Secondary | ICD-10-CM | POA: Diagnosis not present

## 2017-04-19 LAB — POCT INR: INR: 2.7

## 2017-04-19 NOTE — Patient Instructions (Signed)
Description   Continue the same dose of Coumadin 7.5mg every day except 10 mg on Wednesdays. Cou[facility]in Clinic # [phone].Call with any concerns  Recheck INR in 6 weeks.       

## 2017-04-27 ENCOUNTER — Telehealth: Payer: Self-pay | Admitting: Family Medicine

## 2017-04-27 NOTE — Telephone Encounter (Signed)
Copied from [place] [id number]. Topic: General - Other >> Apr 27, 2017  4:47 PM Valla Leaver wrote: Reason for CRM: Patient says Dr. Sharlene Motts was supposed to place an order for labs to recheck thyroid on May 6 but no order in chart. Please advise.   I left a voice message, advised to call and schedule the lab appointment, orders are in for labs.

## 2017-04-28 ENCOUNTER — Other Ambulatory Visit: Payer: Self-pay

## 2017-04-28 DIAGNOSIS — R7989 Other specified abnormal findings of blood chemistry: Secondary | ICD-10-CM

## 2017-04-28 NOTE — Telephone Encounter (Signed)
Orders are in. Please schedule pt.   Thanks

## 2017-05-09 ENCOUNTER — Encounter: Payer: Self-pay | Admitting: Family Medicine

## 2017-05-09 ENCOUNTER — Ambulatory Visit (INDEPENDENT_AMBULATORY_CARE_PROVIDER_SITE_OTHER): Payer: Medicare Other | Admitting: Family Medicine

## 2017-05-09 VITALS — BP 142/80 | HR 60 | Temp 97.8°F | Ht 69.0 in | Wt 144.2 lb

## 2017-05-09 DIAGNOSIS — R21 Rash and other nonspecific skin eruption: Secondary | ICD-10-CM | POA: Diagnosis not present

## 2017-05-09 MED ORDER — DOXYCYCLINE HYCLATE 100 MG PO TABS: 100.0000 mg | ORAL_TABLET | Freq: Two times a day (BID) | ORAL | 0 refills | Status: DC

## 2017-05-09 NOTE — Progress Notes (Signed)
   Subjective:    Patient ID: David Benton, male    DOB: Mar 05, 1929, 84 y.o.   MRN: [id number]  HPI Here for a rash from a tick bite on the right lower leg. He pulled the tick off about 10 days ago. He then developed an itchy red rash around it. Npo other symptoms. He is applying a steroid cream to the area.    Review of Systems  Constitutional: Negative.   Respiratory: Negative.   Cardiovascular: Negative.   Skin: Positive for rash.  Neurological: Negative.        Objective:   Physical Exam  Constitutional: He is oriented to person, place, and time. He appears well-developed and well-nourished.  Cardiovascular: Normal rate, regular rhythm, normal heart sounds and intact distal pulses.  Pulmonary/Chest: Effort normal and breath sounds normal. No respiratory distress. He has no wheezes. He has no rales.  Neurological: He is alert and oriented to person, place, and time.  Skin:  The right lower leg has an area of macular erythema, not warm or tender           Assessment & Plan:  Tick bite rash, cover with Doxycycline. Alysia Penna, MD

## 2017-05-12 ENCOUNTER — Telehealth: Payer: Self-pay | Admitting: *Deleted

## 2017-05-12 ENCOUNTER — Ambulatory Visit (INDEPENDENT_AMBULATORY_CARE_PROVIDER_SITE_OTHER): Payer: Medicare Other | Admitting: Pharmacist

## 2017-05-12 DIAGNOSIS — Z5181 Encounter for therapeutic drug level monitoring: Secondary | ICD-10-CM | POA: Diagnosis not present

## 2017-05-12 LAB — POCT INR: INR: 2.7

## 2017-05-12 NOTE — Patient Instructions (Signed)
Description   Continue the same dose of Coumadin 7.5mg every day except 10 mg on Wednesdays. Cou[facility]in Clinic # [phone].Call with any concerns  Recheck INR in 6 weeks.       

## 2017-05-12 NOTE — Telephone Encounter (Signed)
Pt called to inform us that he had been taking Doxycycline 100 mg bid for 10 days and he started this on Monday April 22nd for tic bite., Instructed there is an interaction between Doxycycline and Coumadin and made an appt for his INR to be checked today April 26th  And he states understanding

## 2017-05-22 ENCOUNTER — Other Ambulatory Visit (INDEPENDENT_AMBULATORY_CARE_PROVIDER_SITE_OTHER): Payer: Medicare Other

## 2017-05-22 DIAGNOSIS — R7989 Other specified abnormal findings of blood chemistry: Secondary | ICD-10-CM

## 2017-05-22 LAB — T4, FREE: FREE T4: 0.91 ng/dL (ref 0.60–1.60)

## 2017-05-22 LAB — TSH: TSH: 0.4 u[IU]/mL (ref 0.35–4.50)

## 2017-05-22 LAB — T3, FREE: T3 FREE: 3.6 pg/mL (ref 2.3–4.2)

## 2017-05-23 NOTE — Progress Notes (Addendum)
Subjective:   David Benton is a 84 y.o. male who presents for Medicare Annual/Subsequent preventive examination.  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   Cardiac Risk Factors include: advanced age (>25men, >8 women);family history of premature cardiovascular disease;hypertension;male gender     Objective:    Vitals: Ht 5\' 10"  (1.778 m)   Wt 143 lb (64.9 kg)   SpO2 98%   BMI 20.52 kg/m   Body mass index is 20.52 kg/m.  Advanced Directives 05/24/2017 11/18/2016 11/14/2016  Does Patient Have a Medical Advance Directive? Yes Yes Yes  Type of Advance Directive - [place];Living will -  Does patient want to make changes to medical advance directive? - No - Patient declined -  Copy of [place] in Chart? - Yes No - copy requested    Tobacco Social History   Tobacco Use  Smoking Status Former Smoker  . Packs/day: 0.50  . Years: 10.00  . Pack years: 5.00  . Types: Cigarettes  . Last attempt to quit: 02/06/1972  . Years since quitting: 45.3  Smokeless Tobacco Never Used     Counseling given: Yes   Clinical Intake:     Past Medical History:  Diagnosis Date  . AVD (aortic valve disease) 02/25/2010   sees [name]   . External hemorrhoids without mention of complication   . Heart valve replaced by other means   . Long term (current) use of anticoagulants 09/15/2010  . Meniere's disease   . Other and unspecified hyperlipidemia   .  Other constipation   . Parkinson disease ([place]) 02/2016   no meds, sees [name]   . Personal history of colonic polyps   . Personal history of malignant neoplasm of prostate   . Right inguinal hernia 11/18/2016  . Shingles   . SHINGLES 11/26/2009  . Thoracic aneurysm without mention of rupture    followed by [name] with yearly CT scans   . Unspecified essential hypertension    Past Surgical History:  Procedure Laterality Date  . AORTIC VALVE REPLACEMENT  1991    st. jude's valve, per [name]   . COLONOSCOPY  06-28-10   per Dr. Henrene Pastor, benign polyps, no further scopes needed   . HEMORRHOID SURGERY    . INGUINAL HERNIA REPAIR Right 11/18/2016   Procedure: OPEN REPAIR RIGHT INGUINAL HERNIA WITH MESH;  Surgeon: Fanny Skates, MD;  Location: [facility];  Service: General;  Laterality: Right;  . INSERTION OF MESH Right 11/18/2016   Procedure: INSERTION OF MESH;  Surgeon: Fanny Skates, MD;  Location: [facility];  Service: General;  Laterality: Right;  . PROSTATECTOMY  1995   per Dr. Roni Bread   . TONSILLECTOMY     Family History  Problem Relation Age of Onset  . Heart attack Father 69       fatal  at 84  . Hypertension Father   . CAD Father   . Other Father        possible PD as says had "palsy"  . Prostate cancer Brother   . Diabetes Brother   . Heart attack Mother   . Diabetes Brother   . Stroke Brother   . Heart disease Brother        carotid artery disease  . Hypertension Brother   . Breast cancer Sister        mastectomy at 65  . Colon cancer Neg Hx    Social History   Socioeconomic History  . Marital status: Married    Spouse name: Not on file  . Number of children: 0  . Years of education: 90  . Highest education level: Not on file  Occupational History  . Occupation: retired    Comment: [employer], production  Social Needs  . Financial resource strain: Not on file  . Food insecurity:    Worry: Not on file     Inability: Not on file  . Transportation needs:    Medical: Not on file    Non-medical: Not on file  Tobacco Use  . Smoking status: Former Smoker    Packs/day: 0.50    Years: 10.00    Pack years: 5.00    Types: Cigarettes    Last attempt to quit: 02/06/1972    Years since quitting: 45.3  . Smokeless tobacco: Never Used  Substance and Sexual Activity  . Alcohol use: Yes    Alcohol/week: 4.2 oz    Types: 7 Standard drinks or equivalent per week    Comment: glass of wine daily; just about   . Drug use: No  . Sexual activity: Never  Lifestyle  . Physical activity:    Days per week: Not on file    Minutes per session: Not on file  . Stress: Not on file  Relationships  . Social connections:    Talks on phone: Not on file    Gets together: Not on file    Attends religious service: Not on file    Active member of club or organization: Not on file    Attends meetings of clubs or organizations: Not on file    Relationship status: Not on file  Other Topics Concern  . Not on file  Social History Narrative   HSG, a couple of years of college. Married '78 - . No children. Work - [employer] 75 years - retired.   Lives alone with wife and two dogs. ACP - Yes CPR, Yes - short-term mechanical ventilation. Does not want prolonged heroic measures in the face of irreversible disease. HCPOA - wife. Alternative is his brother: Alben Jepsen [facility].     Outpatient Encounter Medications as of 05/24/2017  Medication Sig  . acetaminophen (TYLENOL) 325 MG tablet Take 650 mg by mouth as needed. Reported on 04/21/2015  . amoxicillin (AMOXIL) 500 MG capsule TAKE FOUR CAPSULES BY MOUTH ONE HOUR PRIOR TO DENTAL APPOINTMENT AS DIRECTED.  Marland Kitchen atorvastatin (LIPITOR) 40 MG tablet TAKE 1 TABLET BY MOUTH EVERY DAY  . levothyroxine (SYNTHROID, LEVOTHROID) 75 MCG tablet Take 1 tablet (75 mcg total) by mouth daily.  . magnesium hydroxide (MILK OF MAGNESIA) 400 MG/5ML suspension Take 30 mLs by mouth daily as  needed for indigestion. Reported on 04/21/2015  . Probiotic Product (PROBIOTIC-10 ULTIMATE) CAPS Take 1 capsule by mouth daily.  . sodium chloride (OCEAN) 0.65 %  SOLN nasal spray Place 1 spray into both nostrils as needed for congestion. Reported on 04/21/2015  . TRIAMTERENE-HCTZ PO Take by mouth. 37.5-25 mg take 1 daily.   Marland Kitchen warfarin (COUMADIN) 5 MG tablet TAKE AS DIRECTED BY COUMADIN CLINIC FOR 30 DAYS  . [DISCONTINUED] doxycycline (VIBRA-TABS) 100 MG tablet Take 1 tablet (100 mg total) by mouth 2 (two) times daily.   No facility-administered encounter medications on file as of 05/24/2017.     Activities of Daily Living In your present state of health, do you have any difficulty performing the following activities: 05/24/2017 11/18/2016  Hearing? Tempie Donning  Vision? Y N  Difficulty concentrating or making decisions? N N  Walking or climbing stairs? N N  Comment has 5 or 6 at the front door, and garage, no issues -  Dressing or bathing? N N  Doing errands, shopping? N -  Preparing Food and eating ? N -  Using the Toilet? N -  In the past six months, have you accidently leaked urine? N -  Comment no, just up at hs -  Do you have problems with loss of bowel control? N -  Managing your Medications? N -  Managing your Finances? N -  Housekeeping or managing your Housekeeping? N -  Some recent data might be hidden    Patient Care Team: [name], MD as PCP - General (Family Medicine) Grace Isaac, MD as Consulting Physician (Cardiothoracic Surgery) Sharyne Peach, MD as Consulting Physician (Ophthalmology) Druscilla Brownie, MD as Consulting Physician (Dermatology) Irine Seal, MD as Consulting Physician (Urology) Larey Dresser, MD as Consulting Physician (Cardiology)   Assessment:   This is a routine wellness examination for [place].  Exercise Activities and Dietary recommendations Current Exercise Habits: Home exercise routine, Time (Minutes): 60, Intensity: Moderate  Goals    .  Patient Stated     Planned to stick to the plan  Continue boxing        Fall Risk Fall Risk  05/24/2017 05/09/2017 01/26/2017 03/04/2016 05/22/2014  Falls in the past year? No No No No No   Yes but no falls., gets up from chair easily but stands a few minutes prior to moving.  Depression Screen PHQ 2/9 Scores 05/24/2017 05/09/2017 04/21/2015 05/22/2014  PHQ - 2 Score 0 0 0 0   Denies depression but has bad days   Cognitive Function MMSE - Mini Mental State Exam 05/24/2017  Not completed: (No Data)   Ad8 score reviewed for issues:  Issues making decisions:  Less interest in hobbies / activities:  Repeats questions, stories (family complaining):  Trouble using ordinary gadgets (microwave, computer, phone):  Forgets the month or year:   Mismanaging finances:   Remembering appts:  Daily problems with thinking and/or memory: Ad8 score is=0 He is a good historian   Feels memory is good, sometimes forgets names  States he sleeps well  Up x 2 to 3 times a night and drinks water  Hx of head trauma in the 3rd grade         Immunization History  Administered Date(s) Administered  . Influenza Split 10/09/2013, 10/08/2014, 10/10/2016  . Influenza Whole 11/10/2008, 10/10/2011  . Influenza-Unspecified 09/17/2012, 10/08/2014, 10/21/2015  . Pneumococcal Conjugate-13 03/07/2013  . Pneumococcal Polysaccharide-23 01/19/2003  . Td 01/19/2003  . Tdap 05/21/2013      Screening Tests Health Maintenance  Topic Date Due  . INFLUENZA VACCINE  08/17/2017  . TETANUS/TDAP  05/22/2023  . PNA vac Low Risk Adult  Completed       Plan:      PCP Notes   Health Maintenance Discussed shingrix vaccinations, will discuss with dr. Sarajane Jews when he comes back in   States he does have hearing issues on the left. Plans to fup at [facility] hearing office for evaluation   [place] [employer] plan   Abnormal Screens  None noted   Referrals  none  Patient concerns; In the [place]  Is checked in Feb and August   Nurse Concerns; As noted   Next PCP apt  TBS but sees [name] in July       I have personally reviewed and noted the following in the patient's chart:   . Medical and social history . Use of alcohol, tobacco or illicit drugs  . Current medications and supplements . Functional ability and status . Nutritional status . Physical activity . Advanced directives . List of other physicians . Hospitalizations, surgeries, and ER visits in previous 12 months . Vitals . Screenings to include cognitive, depression, and falls . Referrals and appointments  In addition, I have reviewed and discussed with patient certain preventive protocols, quality metrics, and best practice recommendations. A written personalized care plan for preventive services as well as general preventive health recommendations were provided to patient.     VQMGQ,QPYPP, RN  05/24/2017  I have reviewed the documentation for the AWV and [place] provided by the health coach and agree with their documentation. I was immediately available for any questions  Eulas Post MD [facility]

## 2017-05-24 ENCOUNTER — Ambulatory Visit (INDEPENDENT_AMBULATORY_CARE_PROVIDER_SITE_OTHER): Payer: Medicare Other

## 2017-05-24 VITALS — BP 142/80 | Ht 70.0 in | Wt 143.0 lb

## 2017-05-24 DIAGNOSIS — Z0000 Encounter for general adult medical examination without abnormal findings: Secondary | ICD-10-CM

## 2017-05-24 NOTE — Patient Instructions (Addendum)
[name] , Thank you for taking time to come for your Medicare Wellness Visit. I appreciate your ongoing commitment to your health goals. Please review the following plan we discussed and let me know if I can assist you in the future.   Will discuss with Dr. Sarajane Jews regarding taken the new shingles injection Shingrix is a vaccine for the prevention of Shingles in Adults 82 and older.  If you are on Medicare, the shingrix is covered under your Part D plan, so you will take both of the vaccines in the series at your pharmacy. Please check with your benefits regarding applicable copays or out of pocket expenses.  The Shingrix is given in 2 vaccines approx 8 weeks apart. You must receive the 2nd dose prior to 6 months from receipt of the first. Please have the pharmacist print out you Immunization  dates for our office records   Will try to get your hearing checked  [facility] - can assist with hearing aid x 1  No reviews  [place]  [place] [phone]  [phone]  [email or web address]-[email or web address]_How_to_Buy_Hearing_Aids.pdf  These are the goals we discussed: Goals    . Patient Stated     Planned to stick to the plan  Continue boxing        This is a list of the screening recommended for you and due dates:  Health Maintenance  Topic Date Due  . Flu Shot  08/17/2017  . Tetanus Vaccine  05/22/2023  . Pneumonia vaccines  Completed      Fall Prevention in the Home Falls can cause injuries. They can happen to people of all ages. There are many things you can do to make your home safe and to help prevent falls. What can I do on the outside of my home?  Regularly fix the edges of walkways and driveways and fix any cracks.  Remove anything that might make you trip as you walk through a door, such as a raised step or threshold.  Trim any bushes or trees on the path to your home.  Use  bright outdoor lighting.  Clear any walking paths of anything that might make someone trip, such as rocks or tools.  Regularly check to see if handrails are loose or broken. Make sure that both sides of any steps have handrails.  Any raised decks and porches should have guardrails on the edges.  Have any leaves, snow, or ice cleared regularly.  Use sand or salt on walking paths during winter.  Clean up any spills in your garage right away. This includes oil or grease spills. What can I do in the bathroom?  Use night lights.  Install grab bars by the toilet and in the tub and shower. Do not use towel bars as grab bars.  Use non-skid mats or decals in the tub or shower.  If you need to sit down in the shower, use a plastic, non-slip stool.  Keep the floor dry. Clean up any water that spills on the floor as soon as it happens.  Remove soap buildup in the tub or shower regularly.  Attach bath mats securely with double-sided non-slip rug tape.  Do not have throw rugs and other things on the floor that can make you trip. What can I do in the bedroom?  Use night lights.  Make sure that you have a light by your bed that is easy to reach.  Do not use any  sheets or blankets that are too big for your bed. They should not hang down onto the floor.  Have a firm chair that has side arms. You can use this for support while you get dressed.  Do not have throw rugs and other things on the floor that can make you trip. What can I do in the kitchen?  Clean up any spills right away.  Avoid walking on wet floors.  Keep items that you use a lot in easy-to-reach places.  If you need to reach something above you, use a strong step stool that has a grab bar.  Keep electrical cords out of the way.  Do not use floor polish or wax that makes floors slippery. If you must use wax, use non-skid floor wax.  Do not have throw rugs and other things on the floor that can make you trip. What can  I do with my stairs?  Do not leave any items on the stairs.  Make sure that there are handrails on both sides of the stairs and use them. Fix handrails that are broken or loose. Make sure that handrails are as long as the stairways.  Check any carpeting to make sure that it is firmly attached to the stairs. Fix any carpet that is loose or worn.  Avoid having throw rugs at the top or bottom of the stairs. If you do have throw rugs, attach them to the floor with carpet tape.  Make sure that you have a light switch at the top of the stairs and the bottom of the stairs. If you do not have them, ask someone to add them for you. What else can I do to help prevent falls?  Wear shoes that: ? Do not have high heels. ? Have rubber bottoms. ? Are comfortable and fit you well. ? Are closed at the toe. Do not wear sandals.  If you use a stepladder: ? Make sure that it is fully opened. Do not climb a closed stepladder. ? Make sure that both sides of the stepladder are locked into place. ? Ask someone to hold it for you, if possible.  Clearly mark and make sure that you can see: ? Any grab bars or handrails. ? First and last steps. ? Where the edge of each step is.  Use tools that help you move around (mobility aids) if they are needed. These include: ? Canes. ? Walkers. ? Scooters. ? Crutches.  Turn on the lights when you go into a dark area. Replace any light bulbs as soon as they burn out.  Set up your furniture so you have a clear path. Avoid moving your furniture around.  If any of your floors are uneven, fix them.  If there are any pets around you, be aware of where they are.  Review your medicines with your doctor. Some medicines can make you feel dizzy. This can increase your chance of falling. Ask your doctor what other things that you can do to help prevent falls. This information is not intended to replace advice given to you by your health care provider. Make sure you  discuss any questions you have with your health care provider. Document Released: 10/30/2008 Document Revised: 06/11/2015 Document Reviewed: 02/07/2014 Elsevier Interactive Patient Education  2018 [place] Maintenance, Male A healthy lifestyle and preventive care is important for your health and wellness. Ask your health care provider about what schedule of regular examinations is right for you. What should I  know about weight and diet? Eat a Healthy Diet  Eat plenty of vegetables, fruits, whole grains, low-fat dairy products, and lean protein.  Do not eat a lot of foods high in solid fats, added sugars, or salt.  Maintain a Healthy Weight Regular exercise can help you achieve or maintain a healthy weight. You should:  Do at least 150 minutes of exercise each week. The exercise should increase your heart rate and make you sweat (moderate-intensity exercise).  Do strength-training exercises at least twice a week.  Watch Your Levels of Cholesterol and Blood Lipids  Have your blood tested for lipids and cholesterol every 5 years starting at 82 years of age. If you are at high risk for heart disease, you should start having your blood tested when you are 82 years old. You may need to have your cholesterol levels checked more often if: ? Your lipid or cholesterol levels are high. ? You are older than 82 years of age. ? You are at high risk for heart disease.  What should I know about cancer screening? Many types of cancers can be detected early and may often be prevented. Lung Cancer  You should be screened every year for lung cancer if: ? You are a current smoker who has smoked for at least 30 years. ? You are a former smoker who has quit within the past 15 years.  Talk to your health care provider about your screening options, when you should start screening, and how often you should be screened.  Colorectal Cancer  Routine colorectal cancer screening usually  begins at 82 years of age and should be repeated every 5-10 years until you are 82 years old. You may need to be screened more often if early forms of precancerous polyps or small growths are found. Your health care provider may recommend screening at an earlier age if you have risk factors for colon cancer.  Your health care provider may recommend using home test kits to check for hidden blood in the stool.  A small camera at the end of a tube can be used to examine your colon (sigmoidoscopy or colonoscopy). This checks for the earliest forms of colorectal cancer.  Prostate and Testicular Cancer  Depending on your age and overall health, your health care provider may do certain tests to screen for prostate and testicular cancer.  Talk to your health care provider about any symptoms or concerns you have about testicular or prostate cancer.  Skin Cancer  Check your skin from head to toe regularly.  Tell your health care provider about any new moles or changes in moles, especially if: ? There is a change in a mole's size, shape, or color. ? You have a mole that is larger than a pencil eraser.  Always use sunscreen. Apply sunscreen liberally and repeat throughout the day.  Protect yourself by wearing long sleeves, pants, a wide-brimmed hat, and sunglasses when outside.  What should I know about heart disease, diabetes, and high blood pressure?  If you are 48-8 years of age, have your blood pressure checked every 3-5 years. If you are 37 years of age or older, have your blood pressure checked every year. You should have your blood pressure measured twice-once when you are at a hospital or clinic, and once when you are not at a hospital or clinic. Record the average of the two measurements. To check your blood pressure when you are not at a hospital or clinic, you can use: ? An  automated blood pressure machine at a pharmacy. ? A home blood pressure monitor.  Talk to your health care  provider about your target blood pressure.  If you are between 61-63 years old, ask your health care provider if you should take aspirin to prevent heart disease.  Have regular diabetes screenings by checking your fasting blood sugar level. ? If you are at a normal weight and have a low risk for diabetes, have this test once every three years after the age of 30. ? If you are overweight and have a high risk for diabetes, consider being tested at a younger age or more often.  A one-time screening for abdominal aortic aneurysm (AAA) by ultrasound is recommended for men aged 14-75 years who are current or former smokers. What should I know about preventing infection? Hepatitis B If you have a higher risk for hepatitis B, you should be screened for this virus. Talk with your health care provider to find out if you are at risk for hepatitis B infection. Hepatitis C Blood testing is recommended for:  Everyone born from [birth year] through 1963.  Anyone with known risk factors for hepatitis C.  Sexually Transmitted Diseases (STDs)  You should be screened each year for STDs including gonorrhea and chlamydia if: ? You are sexually active and are younger than 82 years of age. ? You are older than 82 years of age and your health care provider tells you that you are at risk for this type of infection. ? Your sexual activity has changed since you were last screened and you are at an increased risk for chlamydia or gonorrhea. Ask your health care provider if you are at risk.  Talk with your health care provider about whether you are at high risk of being infected with HIV. Your health care provider may recommend a prescription medicine to help prevent HIV infection.  What else can I do?  Schedule regular health, dental, and eye exams.  Stay current with your vaccines (immunizations).  Do not use any tobacco products, such as cigarettes, chewing tobacco, and e-cigarettes. If you need help quitting, ask  your health care provider.  Limit alcohol intake to no more than 2 drinks per day. One drink equals 12 ounces of beer, 5 ounces of wine, or 1 ounces of hard liquor.  Do not use street drugs.  Do not share needles.  Ask your health care provider for help if you need support or information about quitting drugs.  Tell your health care provider if you often feel depressed.  Tell your health care provider if you have ever been abused or do not feel safe at home. This information is not intended to replace advice given to you by your health care provider. Make sure you discuss any questions you have with your health care provider. Document Released: 07/02/2007 Document Revised: 09/02/2015 Document Reviewed: 10/07/2014 Elsevier Interactive Patient Education  [employer].

## 2017-06-21 ENCOUNTER — Ambulatory Visit (INDEPENDENT_AMBULATORY_CARE_PROVIDER_SITE_OTHER): Payer: Medicare Other | Admitting: *Deleted

## 2017-06-21 DIAGNOSIS — Z5181 Encounter for therapeutic drug level monitoring: Secondary | ICD-10-CM | POA: Diagnosis not present

## 2017-06-21 LAB — POCT INR: INR: 2.8 (ref 2.0–3.0)

## 2017-06-21 NOTE — Patient Instructions (Signed)
Description   Continue the same dose of Coumadin 7.5mg every day except 10 mg on Wednesdays. Cou[facility]in Clinic # [phone].Call with any concerns  Recheck INR in 6 weeks.       

## 2017-07-17 ENCOUNTER — Other Ambulatory Visit: Payer: Self-pay | Admitting: Internal Medicine

## 2017-07-17 NOTE — Telephone Encounter (Signed)
Please review for refill, Thanks !  

## 2017-07-25 NOTE — Progress Notes (Signed)
David Benton was seen today in the movement disorders clinic for neurologic consultation at the request of [name], MD.  This patient is accompanied in the office by his spouse who supplements the history.  The consultation is for the evaluation of tremor.  06/27/16 update:  Patient seen today in follow-up for parkinsonism.  He is on no medication.  Pt denies falls.  Pt denies lightheadedness, near syncope.  No hallucinations.  Mood has been good.  CT brain done and reviewed since last visit and was non acute.  He had a carotid ultrasound on 04/11/2016.  It was normal, with 1-39% stenosis bilaterally.  He is doing rock steady boxing and he enjoys that.  He is also doing [employer] cycle class.    01/26/17 update: Patient seen today for mild idiopathic Parkinson's disease.  The records that were made available to me were reviewed.   He is on no medication for PD, which has been his desire.  Pt denies falls.  Pt denies lightheadedness, near syncope.  No hallucinations.  Mood has been good.  He had a R inguinal hernia repair in November.  He had to hold exercise for 6 weeks but he is back to exercise now.  He does RSB 2 days per week.  He is also walking for exercise.  07/27/17 update: Patient seen today in follow-up for mild idiopathic Parkinson's disease.  Patient is on no medication.  He has had no falls since last visit.  No lightheadedness or near syncope.  No hallucinations.  Vacationed in [place] in June and walked a lot and did well.  He is going faithful to RSB  PREVIOUS MEDICATIONS: none to date  ALLERGIES:   Allergies  Allergen Reactions  . Codeine     REACTION: N/V    CURRENT MEDICATIONS:  Outpatient Encounter Medications as of 07/27/2017  Medication Sig  . acetaminophen (TYLENOL) 325 MG tablet Take 650 mg by mouth as needed. Reported on 04/21/2015  . atorvastatin (LIPITOR) 40 MG tablet TAKE 1 TABLET BY MOUTH EVERY DAY  . levothyroxine (SYNTHROID, LEVOTHROID) 75 MCG tablet Take  1 tablet (75 mcg total) by mouth daily.  . magnesium hydroxide (MILK OF MAGNESIA) 400 MG/5ML suspension Take 30 mLs by mouth daily as needed for indigestion. Reported on 04/21/2015  . Probiotic Product (PROBIOTIC-10 ULTIMATE) CAPS Take 1 capsule by mouth daily.  . sodium chloride (OCEAN) 0.65 % SOLN nasal spray Place 1 spray into both nostrils as needed for congestion. Reported on 04/21/2015  . TRIAMTERENE-HCTZ PO Take by mouth. 37.5-25 mg take 1 daily.   Marland Kitchen warfarin (COUMADIN) 5 MG tablet TAKE AS DIRECTED BY COUMADIN CLINIC FOR 30 DAYS  . amoxicillin (AMOXIL) 500 MG capsule TAKE FOUR CAPSULES BY MOUTH ONE HOUR PRIOR TO DENTAL APPOINTMENT AS DIRECTED. (Patient not taking: Reported on 07/27/2017)   No facility-administered encounter medications on file as of 07/27/2017.     PAST MEDICAL HISTORY:   Past Medical History:  Diagnosis Date  . AVD (aortic valve disease) 02/25/2010   sees [name]   . External hemorrhoids without mention of complication   . Heart valve replaced by other means   . Long term (current) use of anticoagulants 09/15/2010  . Meniere's disease   . Other and unspecified hyperlipidemia   . Other constipation   . Parkinson disease ([place]) 02/2016   no meds, sees [name]   . Personal history of colonic polyps   . Personal history of malignant neoplasm  of prostate   . Right inguinal hernia 11/18/2016  . Shingles   . SHINGLES 11/26/2009  . Thoracic aneurysm without mention of rupture    followed by [name] with yearly CT scans   . Unspecified essential hypertension     PAST SURGICAL HISTORY:   Past Surgical History:  Procedure Laterality Date  . AORTIC VALVE REPLACEMENT  1991    st. jude's valve, per [name]   . COLONOSCOPY  06-28-10   per Dr. Henrene Pastor, benign polyps, no further scopes needed   . HEMORRHOID SURGERY    . INGUINAL HERNIA REPAIR Right 11/18/2016   Procedure: OPEN REPAIR RIGHT INGUINAL HERNIA WITH MESH;  Surgeon: Fanny Skates, MD;  Location:  [facility];  Service: General;  Laterality: Right;  . INSERTION OF MESH Right 11/18/2016   Procedure: INSERTION OF MESH;  Surgeon: Fanny Skates, MD;  Location: [facility];  Service: General;  Laterality: Right;  . PROSTATECTOMY  1995   per Dr. Roni Bread   . TONSILLECTOMY      SOCIAL HISTORY:   Social History   Socioeconomic History  . Marital status: Married    Spouse name: Not on file  . Number of children: 0  . Years of education: 13  . Highest education level: Not on file  Occupational History  . Occupation: retired    Comment: [employer], production  Social Needs  . Financial resource strain: Not on file  . Food insecurity:    Worry: Not on file    Inability: Not on file  . Transportation needs:    Medical: Not on file    Non-medical: Not on file  Tobacco Use  . Smoking status: Former Smoker    Packs/day: 0.50    Years: 10.00    Pack years: 5.00    Types: Cigarettes    Last attempt to quit: 02/06/1972    Years since quitting: 45.5  . Smokeless tobacco: Never Used  Substance and Sexual Activity  . Alcohol use: Yes    Alcohol/week: 4.2 oz    Types: 7 Standard drinks or equivalent per week    Comment: glass of wine daily; just about   . Drug use: No  . Sexual activity: Never  Lifestyle  . Physical activity:    Days per week: Not on file    Minutes per session: Not on file  . Stress: Not on file  Relationships  . Social connections:    Talks on phone: Not on file    Gets together: Not on file    Attends religious service: Not on file    Active member of club or organization: Not on file    Attends meetings of clubs or organizations: Not on file    Relationship status: Not on file  . Intimate partner violence:    Fear of current or ex partner: Not on file    Emotionally abused: Not on file    Physically abused: Not on file    Forced sexual activity: Not on file  Other Topics Concern  . Not on file  Social History Narrative    HSG, a couple of years of college. Married '78 - . No children. Work - [employer] 11 years - retired.   Lives alone with wife and two dogs. ACP - Yes CPR, Yes - short-term mechanical ventilation. Does not want prolonged heroic measures in the face of irreversible disease. HCPOA - wife. Alternative is his brother: Tylene Fantasia -  [employer].     FAMILY HISTORY:   Family Status  Relation Name Status  . Father  Deceased at age 53  . Brother  Deceased  . Brother  Deceased  . Mother  Deceased at age 59       CAD, MI-fatal, DM  . Brother  Deceased       DM  . Sister  Deceased       born 71  . Brother  Deceased  . Brother  Alive  . Sister  Deceased       born 61  . MGM  Deceased  . MGF  Other  . PGM  Deceased  . PGF  Deceased  . Neg Hx  (Not Specified)    ROS:  A complete 10 system review of systems was obtained and was unremarkable apart from what is mentioned above.  PHYSICAL EXAMINATION:    VITALS:   Vitals:   07/27/17 1119  BP: 128/70  Pulse: 62  SpO2: 97%  Weight: 144 lb (65.3 kg)  Height: 5\' 10"  (1.778 m)   GEN:  The patient appears stated age and is in NAD. HEENT:  Normocephalic, atraumatic.  The mucous membranes are moist. The superficial temporal arteries are without ropiness or tenderness. CV:  RRR Lungs:  CTAB Neck/HEME:  There are no carotid bruits bilaterally.  Neurological examination:  Orientation: The patient is alert and oriented x3. Cranial nerves: There is good facial symmetry. The speech is fluent and clear. Soft palate rises symmetrically and there is no tongue deviation. Hearing is intact to conversational tone. Sensation: Sensation is intact to light touch throughout Motor: Strength is 5/5 in the bilateral upper and lower extremities.   Shoulder shrug is equal and symmetric.  There is no pronator drift.  Movement examination: Tone: There is mild to moderate increased tone in the left upper extremity.  Tone elsewhere is normal Abnormal  movements: There is near constant mild to moderate left upper extremity resting tremor. Coordination:  There is mild decremation with RAM's, with finger taps and hand opening and closing on the left. Gait and Station: The patient has no difficulty arising out of a deep-seated chair without the use of the hands. The patient's stride length is normal with good arm swing.       LABS:  Lab Results  Component Value Date   TSH 0.40 05/22/2017     Chemistry      Component Value Date/Time   NA 140 02/16/2017 0817   NA 142 07/08/2016 1449   K 4.3 02/16/2017 0817   CL 102 02/16/2017 0817   CO2 34 (H) 02/16/2017 0817   BUN 21 02/16/2017 0817   BUN 26 07/08/2016 1449   CREATININE 1.06 02/16/2017 0817   CREATININE 1.09 07/30/2014 1400      Component Value Date/Time   CALCIUM 9.2 02/16/2017 0817   ALKPHOS 89 02/16/2017 0817   AST 19 02/16/2017 0817   ALT 20 02/16/2017 0817   BILITOT 1.8 (H) 02/16/2017 0817   BILITOT 1.7 (H) 07/08/2016 1449       ASSESSMENT/PLAN:  1.  Idiopathic tremor predominant Parkinson's disease  -Talked again about the value of going on medication.  Patient really does not want this.  Fortunately, he has good balance.  He will let me know if he changes mind about medication.  He will continue to exercise with rock steady boxing.  2.  Follow-up in 6 months.     Cc:  [name], MD

## 2017-07-27 ENCOUNTER — Ambulatory Visit: Payer: Medicare Other | Admitting: Neurology

## 2017-07-27 ENCOUNTER — Encounter: Payer: Self-pay | Admitting: Neurology

## 2017-07-27 VITALS — BP 128/70 | HR 62 | Ht 70.0 in | Wt 144.0 lb

## 2017-07-27 DIAGNOSIS — G20 Parkinson's disease: Secondary | ICD-10-CM | POA: Diagnosis not present

## 2017-07-27 NOTE — Patient Instructions (Signed)
 Community Parkinson's Exercise Programs   Parkinson's Wellness Recovery Exercise Programs:   PWR! Moves PD Exercise Class:  This is a therapist-led exercise class for people with Parkinson's disease in the [place] community. It consists of a one-hour exercise class each week. Classes are offered in eight-week sessions, and the cost per session is $80. Class size is limited to a maximum of 20 participants. Participant criteria includes: Participant must be able to get up and down from the floor with minimal to no assistance, have had 0-1 falls in the past 6 months, and have completed physical or occupational therapy at [facility] within the past year.  To find out more about session dates, questions, or to register, please contact Amy Marriott, Physical Therapist, or Denise Robertson, Physical Therapist Assistant, at [facility] at [phone].  PWR! Circuit Class:  This is a therapist-led exercise class with intervals of circuit activities incorporating PWR! Moves into functional activities. It consists of one 45-minute exercise class per week. Classes are offered in eight-week sessions, and the cost per session is $120. Class size is limited to a maximum of eight participants to allow for hands-on instruction. Participant criteria: class is ideal for people with Parkinson's disease who have completed PWR! Moves Exercise Class or who are currently independently exercising and want to be challenged, must be able to walk independently with 0-1 falls in the past 6 months, able to get up and down from the floor independently, able to sit to stand independently, and able to jog 20 feet.   To find out more about session dates, questions, or to register, please contact Amy Marriott, Physical Therapist, or Denise Robertson, Physical Therapist Assistant, at [facility] at [phone].   [employer] Parkinson's Cycle:    Parkinson's Cycle Class at [facility] This is an ongoing class on Monday and Thursday mornings at 10:45 a.m. A healthcare provider referral is required to enroll. This class is FREE to participants, and you do not have to be a member of the [employer] to enroll. Contact Beth at [phone] or [email or web address]. Parkinson's Cycle Class at [facility] Ongoing Class Monday, Wednesday, and Friday mornings at 9:00 a.m. A healthcare provider referral is required to enroll. This class is FREE to participants, and you do not have to be a member of the [facility] to enroll. Contact Marlee at [phone] or [email or web address]. Parkinson's Cycle Class at [facility] Ongoing Class every Friday mornings at 12 p.m.  A healthcare provider referral is required to enroll. This class is FREE to participants, and you do not have to be a member of the [facility] to enroll. Contact [phone].  Parkinson's Cycle Class at [facility] Ongoing Class every Monday at 12pm.  A healthcare provider referral is required to enroll. This class is FREE to participants, and you do not have to be a member of the [facility] to enroll. Contact Julie at [phone] or  [email or web address].   Rock Steady Boxing:  [employer] [place]  Classes are offered Mondays at 5:15 p.m. and Tuesdays and Thursdays at 12 p.m. at [facility] [employer] [facility]. For more information, contact [phone] or visit [email or web address] or [email or web address]. [employer] [place] Classes are offered Monday, Wednesday, and Friday from 9:30 a.m. - 11:00 a.m. For more information, contact [phone] or [phone] or email [email or web address] or visit [email or web address] or [email or web address]/. [employer] [place] (classes are offered at 2 locations) . [employer] in [place] (for more information,   contact Thad Stovall at  [phone] or email [email or web address] . [place] at [facility] (class is open to the public -- for more information, contact Michael Cain at [phone] or email [email or web address]) Rock Steady [place] Classes are held at [facility] in [place], [place]. For more information, call Dr. Laura Beck at [phone] or [email or web address].   Personal Training for Parkinson's:   [employer] Offers certified personal training to customize a program to meet your exercise needs to address Parkinson's disease. For more information, contact [phone] or visit [email or web address].  Community Dance for Parkinson's:   Community dance class for people with Parkinson's Disease Wednesdays at 9 a.m. [employer] [address] [place], [place] [address] Please contact Christina Soriano [phone] for more information  Scholarships Available for Fitness Programs:  The [employer] for Parkinston's is a non-profit 501(C)3 organization run by volunteers, whose mission is to strive to empower those living with Parkinson's Disease (PD), Progressive Supra-Nuclear Palsy (PSP) and Multiple System Atrophy (MSA).  Through financial support, recipients benefit from individual and group programs. [phone] [email or web address]  

## 2017-07-28 ENCOUNTER — Other Ambulatory Visit: Payer: Self-pay | Admitting: *Deleted

## 2017-07-28 DIAGNOSIS — I712 Thoracic aortic aneurysm, without rupture, unspecified: Secondary | ICD-10-CM

## 2017-08-02 ENCOUNTER — Ambulatory Visit (INDEPENDENT_AMBULATORY_CARE_PROVIDER_SITE_OTHER): Payer: Medicare Other | Admitting: *Deleted

## 2017-08-02 DIAGNOSIS — Z5181 Encounter for therapeutic drug level monitoring: Secondary | ICD-10-CM | POA: Diagnosis not present

## 2017-08-02 LAB — POCT INR: INR: 2.6 (ref 2.0–3.0)

## 2017-08-02 NOTE — Patient Instructions (Signed)
Description   Continue the same dose of Coumadin 7.5mg  every day except 10 mg on Wednesdays. Coumadin Clinic # [phone].Call with any concerns  Recheck INR in 6 weeks.

## 2017-08-18 ENCOUNTER — Ambulatory Visit (INDEPENDENT_AMBULATORY_CARE_PROVIDER_SITE_OTHER): Payer: Medicare Other | Admitting: Family Medicine

## 2017-08-18 ENCOUNTER — Encounter: Payer: Self-pay | Admitting: Family Medicine

## 2017-08-18 VITALS — BP 126/68 | HR 66 | Temp 98.3°F | Ht 70.0 in | Wt 144.0 lb

## 2017-08-18 DIAGNOSIS — H6123 Impacted cerumen, bilateral: Secondary | ICD-10-CM

## 2017-08-18 NOTE — Progress Notes (Signed)
   Subjective:    Patient ID: David Benton, male    DOB: 1929/06/20, 84 y.o.   MRN: [id number]  HPI Here asking for removal of ear wax. He recently saw a hearing aid specialist who said he could do hearing testing because his ears have wax in them. The patient was unaware and has no symptoms.    Review of Systems  Constitutional: Negative.   HENT: Negative.   Eyes: Negative.   Respiratory: Negative.        Objective:   Physical Exam  Constitutional: He appears well-developed and well-nourished.  HENT:  Nose: Nose normal.  Mouth/Throat: Oropharynx is clear and moist.  Both ear canals are full of cerumen   Eyes: Conjunctivae are normal.  Neck: No thyromegaly present.  Pulmonary/Chest: Effort normal and breath sounds normal.  Lymphadenopathy:    He has no cervical adenopathy.          Assessment & Plan:  Cerumen impactions. We attempted to irrigate both ears with water, and we were only able to clear the left ear canal. Cerumen remains in the left canal. We w ill refer him to ENT to clean this out.  Alysia Penna, MD

## 2017-08-31 DIAGNOSIS — H9193 Unspecified hearing loss, bilateral: Secondary | ICD-10-CM | POA: Diagnosis not present

## 2017-08-31 DIAGNOSIS — H6121 Impacted cerumen, right ear: Secondary | ICD-10-CM | POA: Diagnosis not present

## 2017-09-13 ENCOUNTER — Ambulatory Visit (INDEPENDENT_AMBULATORY_CARE_PROVIDER_SITE_OTHER): Payer: Medicare Other | Admitting: *Deleted

## 2017-09-13 DIAGNOSIS — Z5181 Encounter for therapeutic drug level monitoring: Secondary | ICD-10-CM

## 2017-09-13 LAB — POCT INR: INR: 2.2 (ref 2.0–3.0)

## 2017-09-13 NOTE — Patient Instructions (Signed)
Description   Today take 12.5 mgs then continue the same dose of Coumadin 7.5mg  every day except 10 mg on Wednesdays. Coumadin Clinic # [phone].Call with any concerns  Recheck INR in 5 weeks.

## 2017-09-14 ENCOUNTER — Telehealth: Payer: Self-pay | Admitting: Cardiothoracic Surgery

## 2017-09-14 ENCOUNTER — Encounter: Payer: Medicare Other | Admitting: Cardiothoracic Surgery

## 2017-09-14 ENCOUNTER — Telehealth: Payer: Self-pay | Admitting: *Deleted

## 2017-09-14 ENCOUNTER — Ambulatory Visit
Admission: RE | Admit: 2017-09-14 | Discharge: 2017-09-14 | Disposition: A | Discharge status: Home / Self Care | Payer: Medicare Other | Source: Ambulatory Visit | Attending: Cardiothoracic Surgery | Admitting: Cardiothoracic Surgery

## 2017-09-14 DIAGNOSIS — I712 Thoracic aortic aneurysm, without rupture, unspecified: Secondary | ICD-10-CM

## 2017-09-14 MED ORDER — IOPAMIDOL (ISOVUE-370) INJECTION 76%
75.0000 mL | Freq: Once | INTRAVENOUS | Status: AC | PRN
  Administered 2017-09-14: 75 mL via INTRAVENOUS

## 2017-09-14 NOTE — Telephone Encounter (Signed)
David Benton was here to see [name] today for a scheduled appointment for continued surveillance of a known thoracic aortic aneurysm. Due to [name] running behind and David Benton having an exercise appointment he wanted to attend, he left before seeing him. [name] reviewed his scan, read it as stable and advised me to call him with this reading. We will also notify him of a follow up in one year with a repeat scan. I called and left him a message on his home phone.

## 2017-09-22 ENCOUNTER — Telehealth: Payer: Self-pay | Admitting: Family Medicine

## 2017-09-22 NOTE — Telephone Encounter (Signed)
Copied from [place] [id number]. Topic: Quick Communication - See Telephone Encounter >> Sep 22, 2017  1:38 PM Blase Mess A wrote: CRM for notification. See Telephone encounter for: 09/22/17. Patient wanted to notify Dr. Sarajane Jews that he did have the high dose flu shot on Thursday September 21, 2017.

## 2017-09-22 NOTE — Telephone Encounter (Signed)
Chart was updated with vaccine date.

## 2017-10-11 ENCOUNTER — Telehealth: Payer: Self-pay | Admitting: Internal Medicine

## 2017-10-11 NOTE — Telephone Encounter (Signed)
Spoke to patient who would like to come in for his 1 year f/u Monday 9/30, because you are still here in [place].  He is willing to wait until 12/5, if you'd prefer.  He is asymptomatic, no CP or SOB.  Please advise, thank you.

## 2017-10-11 NOTE — Telephone Encounter (Signed)
New Message          Patient wants to come in for his yearly in Sept. Since [name] is moving to [place] at the end of the year, is this ok?

## 2017-10-11 NOTE — Telephone Encounter (Signed)
That is fine.  I am happy to see him on 9/30.  Thanks.  Nelva Bush, MD [facility] Pager: [phone]

## 2017-10-12 DIAGNOSIS — H2513 Age-related nuclear cataract, bilateral: Secondary | ICD-10-CM | POA: Diagnosis not present

## 2017-10-15 NOTE — Progress Notes (Signed)
Follow-up Outpatient Visit Date: 10/16/2017  Primary Care Provider: [name], MD [address] [place] [address]  Chief Complaint: Follow-up aortic valve replacement  HPI:  Mr. Spielmann is a 84 y.o. year-old male with history of bicuspid aortic valve status post mechanical AVR in 1991 (St. Jude), thoracic aortic aneurysm followed by [name], carotid artery stenosis, hypertension, hyperlipidemia,and Parkinson's' disease, who presents for follow-up of valvular heart disease.  I last saw Mr. [place]esto in 10/2016, at which time he was doing well except for chronic balance problems.  He had noticed some improvement since starting "rock-steady boxing."  Today, Mr. Wenzler reports feeling well.  He notes that his balance is about the same, and he continues to participate in rock-steady boxing.  He has not had any falls.  He denies chest pain, shortness of breath, palpitations, lightheadedness, orthopnea, and edema.  He has not experienced any significant bleeding, remaining on warfarin.  Recent CTA of the chest showed stable moderate enlargement of the thoracic aorta, which continues to be followed by [name].  --------------------------------------------------------------------------------------------------  Past Medical History:  Diagnosis Date  . AVD (aortic valve disease) 02/25/2010   sees [name]   . External hemorrhoids without mention of complication   . Heart valve replaced by other means   . Long term (current) use of anticoagulants 09/15/2010  . Meniere's disease   . Other and unspecified hyperlipidemia   . Other constipation   . Parkinson disease ([place]) 02/2016   no meds, sees [name]   . Personal history of colonic polyps   . Personal history of malignant neoplasm of prostate   . Right inguinal hernia 11/18/2016  . Shingles   . SHINGLES 11/26/2009  . Thoracic aneurysm without mention of rupture    followed by [name] with yearly CT scans   .  Unspecified essential hypertension    Past Surgical History:  Procedure Laterality Date  . AORTIC VALVE REPLACEMENT  1991    st. jude's valve, per [name]   . COLONOSCOPY  06-28-10   per Dr. Henrene Pastor, benign polyps, no further scopes needed   . HEMORRHOID SURGERY    . INGUINAL HERNIA REPAIR Right 11/18/2016   Procedure: OPEN REPAIR RIGHT INGUINAL HERNIA WITH MESH;  Surgeon: Fanny Skates, MD;  Location: [facility];  Service: General;  Laterality: Right;  . INSERTION OF MESH Right 11/18/2016   Procedure: INSERTION OF MESH;  Surgeon: Fanny Skates, MD;  Location: [facility];  Service: General;  Laterality: Right;  . PROSTATECTOMY  1995   per Dr. Roni Bread   . TONSILLECTOMY      No outpatient medications have been marked as taking for the 10/16/17 encounter (Appointment) with Damean Poffenberger, Harrell Gave, MD.    Allergies: Codeine  Social History   Tobacco Use  . Smoking status: Former Smoker    Packs/day: 0.50    Years: 10.00    Pack years: 5.00    Types: Cigarettes    Last attempt to quit: 02/06/1972    Years since quitting: 45.7  . Smokeless tobacco: Never Used  Substance Use Topics  . Alcohol use: Yes    Alcohol/week: 7.0 standard drinks    Types: 7 Standard drinks or equivalent per week    Comment: glass of wine daily; just about   . Drug use: No    Family History  Problem Relation Age of Onset  . Heart attack Father 59       fatal at  61  . Hypertension Father   . CAD Father   . Other Father        possible PD as says had "palsy"  . Prostate cancer Brother   . Diabetes Brother   . Heart attack Mother   . Diabetes Brother   . Stroke Brother   . Heart disease Brother        carotid artery disease  . Hypertension Brother   . Breast cancer Sister        mastectomy at 84  . Colon cancer Neg Hx     Review of Systems: A 12-system review of systems was performed and was negative except as noted in the  HPI.  --------------------------------------------------------------------------------------------------  Physical Exam: There were no vitals taken for this visit.  General: NAD. HEENT: No conjunctival pallor or scleral icterus. Moist mucous membranes.  OP clear. Neck: Supple without lymphadenopathy, thyromegaly, JVD, or HJR. Lungs: Normal work of breathing. Clear to auscultation bilaterally without wheezes or crackles. Heart: Regular rate and rhythm with mechanical S1 and S2.  No murmurs, rubs, or gallops. Non-displaced PMI. Abd: Bowel sounds present. Soft, NT/ND without hepatosplenomegaly Ext: No lower extremity edema. Radial, PT, and DP pulses are 2+ bilaterally. Skin: Warm and dry without rash.  EKG: Normal sinus rhythm with left anterior fascicular block, poor R wave progression, and lateral ST/T changes, not significantly changed since 07/08/2016.  Lab Results  Component Value Date   WBC 4.1 02/16/2017   HGB 14.3 02/16/2017   HCT 41.1 02/16/2017   MCV 89.1 02/16/2017   PLT 207.0 02/16/2017    Lab Results  Component Value Date   NA 140 02/16/2017   K 4.3 02/16/2017   CL 102 02/16/2017   CO2 34 (H) 02/16/2017   BUN 21 02/16/2017   CREATININE 1.06 02/16/2017   GLUCOSE 101 (H) 02/16/2017   ALT 20 02/16/2017    Lab Results  Component Value Date   CHOL 126 02/16/2017   HDL 43.70 02/16/2017   LDLCALC 70 02/16/2017   TRIG 64.0 02/16/2017   CHOLHDL 3 02/16/2017    --------------------------------------------------------------------------------------------------  ASSESSMENT AND PLAN: Aortic stenosis status post mechanical AVR [place] continues to do well.  He does not have any symptoms to suggest valve dysfunction.  He remains anticoagulated with warfarin.  He is not on aspirin due to spontaneous forearm hematoma while on aspirin and warfarin.  We will not rechallenge him with aspirin at this time.  Continue antimicrobial prophylaxis prior to dental  procedures.  Thoracic aortic aneurysm Stable moderate dilation of the thoracic aorta.  Continue follow-up with [name].  Blood pressure well controlled today.  Carotid artery stenosis Mild narrowing of both carotid arteries noted on most recent Doppler in 03/2017.  Continue medical therapy to prevent progression.  Hyperlipidemia LDL at goal.  Continue atorvastatin 40 mg daily.  Hypertension Blood pressure well controlled.  Continue triamterene-HCTZ.  Follow-up: Given my transition to the [place] office, Mr. Genova will follow up with Dr. Burt Knack in 1 year.  Nelva Bush, MD 10/15/2017 8:58 PM

## 2017-10-16 ENCOUNTER — Ambulatory Visit: Payer: Medicare Other | Admitting: Internal Medicine

## 2017-10-16 ENCOUNTER — Encounter: Payer: Self-pay | Admitting: Internal Medicine

## 2017-10-16 VITALS — BP 116/70 | HR 75 | Ht 70.0 in | Wt 142.4 lb

## 2017-10-16 DIAGNOSIS — I712 Thoracic aortic aneurysm, without rupture, unspecified: Secondary | ICD-10-CM

## 2017-10-16 DIAGNOSIS — E785 Hyperlipidemia, unspecified: Secondary | ICD-10-CM

## 2017-10-16 DIAGNOSIS — Z952 Presence of prosthetic heart valve: Secondary | ICD-10-CM

## 2017-10-16 DIAGNOSIS — I350 Nonrheumatic aortic (valve) stenosis: Secondary | ICD-10-CM | POA: Diagnosis not present

## 2017-10-16 DIAGNOSIS — I6523 Occlusion and stenosis of bilateral carotid arteries: Secondary | ICD-10-CM | POA: Diagnosis not present

## 2017-10-16 DIAGNOSIS — I10 Essential (primary) hypertension: Secondary | ICD-10-CM

## 2017-10-16 NOTE — Patient Instructions (Addendum)
Medication Instructions:  Your physician recommends that you continue on your current medications as directed. Please refer to the Current Medication list given to you today.  -- If you need a refill on your cardiac medications before your next appointment, please call your pharmacy. --  Labwork: None ordered  Testing/Procedures: None ordered  Follow-Up: Your physician wants you to follow-up in: 1 year with Dr. Emelda Fear will receive a reminder letter in the mail two months in advance. If you don't receive a letter, please call our office to schedule the follow-up appointment.  Thank you for choosing [facility]!!    Any Other Special Instructions Will Be Listed Below (If Applicable).

## 2017-10-18 ENCOUNTER — Ambulatory Visit (INDEPENDENT_AMBULATORY_CARE_PROVIDER_SITE_OTHER): Payer: Medicare Other | Admitting: Pharmacist

## 2017-10-18 DIAGNOSIS — Z5181 Encounter for therapeutic drug level monitoring: Secondary | ICD-10-CM

## 2017-10-18 LAB — POCT INR: INR: 2.4 (ref 2.0–3.0)

## 2017-10-18 NOTE — Patient Instructions (Signed)
Description   Today take 12.5 mgs then chang dose to 7.5mg  every day except 10 mg on Wednesdays and Saturdays. Coumadin Clinic # [phone].Call with any concerns  Recheck INR in 2-3 weeks.

## 2017-11-07 NOTE — Telephone Encounter (Signed)
Closing chart

## 2017-11-08 ENCOUNTER — Ambulatory Visit (INDEPENDENT_AMBULATORY_CARE_PROVIDER_SITE_OTHER): Payer: Medicare Other | Admitting: Pharmacist

## 2017-11-08 DIAGNOSIS — Z5181 Encounter for therapeutic drug level monitoring: Secondary | ICD-10-CM

## 2017-11-08 LAB — POCT INR: INR: 2.3 (ref 2.0–3.0)

## 2017-11-08 NOTE — Patient Instructions (Signed)
Description   Take 2.5 tablets today and 2 tablets tomorrow, then continue taking 1.5 tablets every day except 2 tablets on Wednesdays and Saturdays. Decrease green intake to 1 salad per week. Coumadin Clinic # [phone].Call with any concerns  Recheck INR in 2-3 weeks.

## 2017-11-13 ENCOUNTER — Other Ambulatory Visit: Payer: Self-pay | Admitting: Family Medicine

## 2017-11-13 ENCOUNTER — Other Ambulatory Visit: Payer: Self-pay | Admitting: Internal Medicine

## 2017-11-13 NOTE — Telephone Encounter (Signed)
Refill Request.  

## 2017-11-29 ENCOUNTER — Ambulatory Visit (INDEPENDENT_AMBULATORY_CARE_PROVIDER_SITE_OTHER): Payer: Medicare Other

## 2017-11-29 DIAGNOSIS — I359 Nonrheumatic aortic valve disorder, unspecified: Secondary | ICD-10-CM

## 2017-11-29 DIAGNOSIS — Z5181 Encounter for therapeutic drug level monitoring: Secondary | ICD-10-CM

## 2017-11-29 LAB — POCT INR: INR: 2.6 (ref 2.0–3.0)

## 2017-11-29 NOTE — Patient Instructions (Signed)
Description   Continue taking 1.5 tablets every day except 2 tablets on Wednesdays and Saturdays.  Coumadin Clinic # [phone].Call with any concerns  Recheck INR in 4 weeks.

## 2017-12-07 ENCOUNTER — Ambulatory Visit: Payer: Medicare Other | Admitting: Neurology

## 2017-12-27 ENCOUNTER — Ambulatory Visit (INDEPENDENT_AMBULATORY_CARE_PROVIDER_SITE_OTHER): Payer: Medicare Other | Admitting: Pharmacist

## 2017-12-27 DIAGNOSIS — Z5181 Encounter for therapeutic drug level monitoring: Secondary | ICD-10-CM

## 2017-12-27 LAB — POCT INR: INR: 2.9 (ref 2.0–3.0)

## 2017-12-27 NOTE — Patient Instructions (Signed)
Continue taking 1.5 tablets every day except 2 tablets on Wednesdays and Saturdays.  Coumadin Clinic # [phone].Call with any concerns  Recheck INR in 5 weeks.

## 2018-01-08 ENCOUNTER — Other Ambulatory Visit: Payer: Self-pay | Admitting: Internal Medicine

## 2018-01-08 NOTE — Telephone Encounter (Signed)
Refill Request.  

## 2018-01-18 ENCOUNTER — Other Ambulatory Visit (HOSPITAL_COMMUNITY): Payer: Self-pay

## 2018-01-22 ENCOUNTER — Telehealth: Payer: Self-pay

## 2018-01-22 ENCOUNTER — Other Ambulatory Visit: Payer: Self-pay | Admitting: Cardiology

## 2018-01-22 MED ORDER — AMOXICILLIN 500 MG PO CAPS: ORAL_CAPSULE | ORAL | 1 refills | Status: DC

## 2018-01-22 NOTE — Telephone Encounter (Signed)
Pt called in requesting a refill on his amoxicillin since he has been transferred from being a [name] pt to now being a Dr. Burt Knack pt. Please address. Thank you.

## 2018-01-22 NOTE — Telephone Encounter (Signed)
Per Dr. Darnelle Bos note, "ASSESSMENT AND PLAN: Aortic stenosis status post mechanical AVR David Benton continues to do well.  He does not have any symptoms to suggest valve dysfunction.  He remains anticoagulated with warfarin.  He is not on aspirin due to spontaneous forearm hematoma while on aspirin and warfarin.  We will not rechallenge him with aspirin at this time.  Continue antimicrobial prophylaxis prior to dental procedures."  Amoxil refills sent.

## 2018-01-26 NOTE — Progress Notes (Deleted)
David Benton was seen today in the movement disorders clinic for neurologic consultation at the request of [name], MD.  This patient is accompanied in the office by his spouse who supplements the history.  The consultation is for the evaluation of tremor.  06/27/16 update:  Patient seen today in follow-up for parkinsonism.  He is on no medication.  Pt denies falls.  Pt denies lightheadedness, near syncope.  No hallucinations.  Mood has been good.  CT brain done and reviewed since last visit and was non acute.  He had a carotid ultrasound on 04/11/2016.  It was normal, with 1-39% stenosis bilaterally.  He is doing rock steady boxing and he enjoys that.  He is also doing [employer] cycle class.    01/26/17 update: Patient seen today for mild idiopathic Parkinson's disease.  The records that were made available to me were reviewed.   He is on no medication for PD, which has been his desire.  Pt denies falls.  Pt denies lightheadedness, near syncope.  No hallucinations.  Mood has been good.  He had a R inguinal hernia repair in November.  He had to hold exercise for 6 weeks but he is back to exercise now.  He does RSB 2 days per week.  He is also walking for exercise.  07/27/17 update: Patient seen today in follow-up for mild idiopathic Parkinson's disease.  Patient is on no medication.  He has had no falls since last visit.  No lightheadedness or near syncope.  No hallucinations.  Vacationed in [place] in June and walked a lot and did well.  He is going faithful to RSB  01/29/18 update: Patient is seen today for follow-up for idiopathic Parkinson's disease.  He is on no medication, by his choice.  He reports that he has been stable.  No falls.  No lightheadedness or near syncope.  No hallucinations.  Exercising at [employer].  PREVIOUS MEDICATIONS: none to date  ALLERGIES:   Allergies  Allergen Reactions  . Codeine     REACTION: N/V    CURRENT MEDICATIONS:  Outpatient Encounter  Medications as of 01/29/2018  Medication Sig  . acetaminophen (TYLENOL) 325 MG tablet Take 650 mg by mouth every 6 (six) hours as needed for pain.  Marland Kitchen amoxicillin (AMOXIL) 500 MG capsule Take 4 tablets (2,000mg ) 1 hour prior to dental visits.  Marland Kitchen atorvastatin (LIPITOR) 40 MG tablet TAKE 1 TABLET BY MOUTH EVERY DAY  . levothyroxine (SYNTHROID, LEVOTHROID) 75 MCG tablet TAKE 1 TABLET BY MOUTH EVERY DAY  . magnesium hydroxide (MILK OF MAGNESIA) 400 MG/5ML suspension Take 30 mLs by mouth daily as needed for indigestion. Reported on 04/21/2015  . Probiotic Product (PROBIOTIC-10 ULTIMATE) CAPS Take 1 capsule by mouth daily.  . sodium chloride (OCEAN) 0.65 % SOLN nasal spray Place 1 spray into both nostrils as needed for congestion. Reported on 04/21/2015  . triamterene-hydrochlorothiazide (MAXZIDE-25) 37.5-25 MG tablet Take 1 tablet by mouth daily.  Marland Kitchen warfarin (COUMADIN) 5 MG tablet TAKE AS DIRECTED BY COUMADIN CLINIC FOR 30 DAYS   No facility-administered encounter medications on file as of 01/29/2018.     PAST MEDICAL HISTORY:   Past Medical History:  Diagnosis Date  . AVD (aortic valve disease) 02/25/2010   sees [name]   . External hemorrhoids without mention of complication   . Heart valve replaced by other means   . Long term (current) use of anticoagulants 09/15/2010  . Meniere's disease   .  Other and unspecified hyperlipidemia   . Other constipation   . Parkinson disease ([place]) 02/2016   no meds, sees [name]   . Personal history of colonic polyps   . Personal history of malignant neoplasm of prostate   . Right inguinal hernia 11/18/2016  . Shingles   . SHINGLES 11/26/2009  . Thoracic aneurysm without mention of rupture    followed by [name] with yearly CT scans   . Unspecified essential hypertension     PAST SURGICAL HISTORY:   Past Surgical History:  Procedure Laterality Date  . AORTIC VALVE REPLACEMENT  1991    st. jude's valve, per [name]   . COLONOSCOPY   06-28-10   per Dr. Henrene Pastor, benign polyps, no further scopes needed   . HEMORRHOID SURGERY    . INGUINAL HERNIA REPAIR Right 11/18/2016   Procedure: OPEN REPAIR RIGHT INGUINAL HERNIA WITH MESH;  Surgeon: Fanny Skates, MD;  Location: [place];  Service: General;  Laterality: Right;  . INSERTION OF MESH Right 11/18/2016   Procedure: INSERTION OF MESH;  Surgeon: Fanny Skates, MD;  Location: [place];  Service: General;  Laterality: Right;  . PROSTATECTOMY  1995   per Dr. Roni Bread   . TONSILLECTOMY      SOCIAL HISTORY:   Social History   Socioeconomic History  . Marital status: Married    Spouse name: Not on file  . Number of children: 0  . Years of education: 55  . Highest education level: Not on file  Occupational History  . Occupation: retired    Comment: [employer], production  Social Needs  . Financial resource strain: Not on file  . Food insecurity:    Worry: Not on file    Inability: Not on file  . Transportation needs:    Medical: Not on file    Non-medical: Not on file  Tobacco Use  . Smoking status: Former Smoker    Packs/day: 0.50    Years: 10.00    Pack years: 5.00    Types: Cigarettes    Last attempt to quit: 02/06/1972    Years since quitting: 46.0  . Smokeless tobacco: Never Used  Substance and Sexual Activity  . Alcohol use: Yes    Alcohol/week: 7.0 standard drinks    Types: 7 Standard drinks or equivalent per week    Comment: glass of wine daily; just about   . Drug use: No  . Sexual activity: Never  Lifestyle  . Physical activity:    Days per week: Not on file    Minutes per session: Not on file  . Stress: Not on file  Relationships  . Social connections:    Talks on phone: Not on file    Gets together: Not on file    Attends religious service: Not on file    Active member of club or organization: Not on file    Attends meetings of clubs or organizations: Not on file    Relationship status: Not on file  .  Intimate partner violence:    Fear of current or ex partner: Not on file    Emotionally abused: Not on file    Physically abused: Not on file    Forced sexual activity: Not on file  Other Topics Concern  . Not on file  Social History Narrative   HSG, a couple of years of college. Married '78 - . No children. Work - [employer] 10 years - retired.  Lives alone with wife and two dogs. ACP - Yes CPR, Yes - short-term mechanical ventilation. Does not want prolonged heroic measures in the face of irreversible disease. HCPOA - wife. Alternative is his brother: Antonie Borjon [facility].     FAMILY HISTORY:   Family Status  Relation Name Status  . Father  Deceased at age 30  . Brother  Deceased  . Brother  Deceased  . Mother  Deceased at age 28       CAD, MI-fatal, DM  . Brother  Deceased       DM  . Sister  Deceased       born 48  . Brother  Deceased  . Brother  Alive  . Sister  Deceased       born 74  . MGM  Deceased  . MGF  Other  . PGM  Deceased  . PGF  Deceased  . Neg Hx  (Not Specified)    ROS:  A complete 10 system review of systems was obtained and was unremarkable apart from what is mentioned above.  PHYSICAL EXAMINATION:    VITALS:   There were no vitals filed for this visit. GEN:  The patient appears stated age and is in NAD. HEENT:  Normocephalic, atraumatic.  The mucous membranes are moist. The superficial temporal arteries are without ropiness or tenderness. CV:  RRR Lungs:  CTAB Neck/HEME:  There are no carotid bruits bilaterally.  Neurological examination:  Orientation: The patient is alert and oriented x3. Cranial nerves: There is good facial symmetry. The speech is fluent and clear. Soft palate rises symmetrically and there is no tongue deviation. Hearing is intact to conversational tone. Sensation: Sensation is intact to light touch throughout Motor: Strength is 5/5 in the bilateral upper and lower extremities.   Shoulder shrug is equal and  symmetric.  There is no pronator drift.  Movement examination: Tone: There is mild to moderate increased tone in the left upper extremity.  Tone elsewhere is normal Abnormal movements: There is near constant mild to moderate left upper extremity resting tremor. Coordination:  There is mild decremation with RAM's, with finger taps and hand opening and closing on the left. Gait and Station: The patient has no difficulty arising out of a deep-seated chair without the use of the hands. The patient's stride length is normal with good arm swing.       LABS:  Lab Results  Component Value Date   TSH 0.40 05/22/2017     Chemistry      Component Value Date/Time   NA 140 02/16/2017 0817   NA 142 07/08/2016 1449   K 4.3 02/16/2017 0817   CL 102 02/16/2017 0817   CO2 34 (H) 02/16/2017 0817   BUN 21 02/16/2017 0817   BUN 26 07/08/2016 1449   CREATININE 1.06 02/16/2017 0817   CREATININE 1.09 07/30/2014 1400      Component Value Date/Time   CALCIUM 9.2 02/16/2017 0817   ALKPHOS 89 02/16/2017 0817   AST 19 02/16/2017 0817   ALT 20 02/16/2017 0817   BILITOT 1.8 (H) 02/16/2017 0817   BILITOT 1.7 (H) 07/08/2016 1449       ASSESSMENT/PLAN:  1.  Idiopathic tremor predominant Parkinson's disease  -Talked again about the value of going on medication.  Patient really does not want this.  Fortunately, he has good balance.  He will let me know if he changes mind about medication.  He will continue to exercise with rock steady  boxing.  2. ***     Cc:  [name], MD

## 2018-01-29 ENCOUNTER — Ambulatory Visit: Payer: Medicare Other | Admitting: Neurology

## 2018-01-30 NOTE — Progress Notes (Signed)
David Benton was seen today in the movement disorders clinic for neurologic consultation at the request of [name], MD.  This patient is accompanied in the office by his spouse who supplements the history.  The consultation is for the evaluation of tremor.  06/27/16 update:  Patient seen today in follow-up for parkinsonism.  He is on no medication.  Pt denies falls.  Pt denies lightheadedness, near syncope.  No hallucinations.  Mood has been good.  CT brain done and reviewed since last visit and was non acute.  He had a carotid ultrasound on 04/11/2016.  It was normal, with 1-39% stenosis bilaterally.  He is doing rock steady boxing and he enjoys that.  He is also doing [employer] cycle class.    01/26/17 update: Patient seen today for mild idiopathic Parkinson's disease.  The records that were made available to me were reviewed.   He is on no medication for PD, which has been his desire.  Pt denies falls.  Pt denies lightheadedness, near syncope.  No hallucinations.  Mood has been good.  He had a R inguinal hernia repair in November.  He had to hold exercise for 6 weeks but he is back to exercise now.  He does RSB 2 days per week.  He is also walking for exercise.  07/27/17 update: Patient seen today in follow-up for mild idiopathic Parkinson's disease.  Patient is on no medication.  He has had no falls since last visit.  No lightheadedness or near syncope.  No hallucinations.  Vacationed in [place] in June and walked a lot and did well.  He is going faithful to RSB  01/31/18 update: Patient is seen today for follow-up for idiopathic Parkinson's disease.  He is on no medication, by his choice.  He reports that he has been stable.  No falls. Walked several 10K over at [employer] recently.  They have a tour lined up for next September where they will take a bus to several states and do 10k's at each state.   No lightheadedness or near syncope.  No hallucinations.  Exercising at [employer].   Recently moved to a new townhome.    PREVIOUS MEDICATIONS: none to date  ALLERGIES:   Allergies  Allergen Reactions  . Codeine     REACTION: N/V    CURRENT MEDICATIONS:  Outpatient Encounter Medications as of 01/31/2018  Medication Sig  . acetaminophen (TYLENOL) 325 MG tablet Take 650 mg by mouth every 6 (six) hours as needed for pain.  Marland Kitchen atorvastatin (LIPITOR) 40 MG tablet TAKE 1 TABLET BY MOUTH EVERY DAY  . levothyroxine (SYNTHROID, LEVOTHROID) 75 MCG tablet TAKE 1 TABLET BY MOUTH EVERY DAY  . magnesium hydroxide (MILK OF MAGNESIA) 400 MG/5ML suspension Take 30 mLs by mouth daily as needed for indigestion. Reported on 04/21/2015  . Probiotic Product (PROBIOTIC-10 ULTIMATE) CAPS Take 1 capsule by mouth daily.  . sodium chloride (OCEAN) 0.65 % SOLN nasal spray Place 1 spray into both nostrils as needed for congestion. Reported on 04/21/2015  . triamterene-hydrochlorothiazide (MAXZIDE-25) 37.5-25 MG tablet Take 1 tablet by mouth daily.  Marland Kitchen warfarin (COUMADIN) 5 MG tablet TAKE AS DIRECTED BY COUMADIN CLINIC FOR 30 DAYS  . amoxicillin (AMOXIL) 500 MG capsule Take 4 tablets (2,000mg ) 1 hour prior to dental visits. (Patient not taking: Reported on 01/31/2018)   No facility-administered encounter medications on file as of 01/31/2018.     PAST MEDICAL HISTORY:   Past Medical History:  Diagnosis Date  . AVD (aortic valve disease) 02/25/2010   sees [name]   . External hemorrhoids without mention of complication   . Heart valve replaced by other means   . Long term (current) use of anticoagulants 09/15/2010  . Meniere's disease   . Other and unspecified hyperlipidemia   . Other constipation   . Parkinson disease ([place]) 02/2016   no meds, sees [name]   . Personal history of colonic polyps   . Personal history of malignant neoplasm of prostate   . Right inguinal hernia 11/18/2016  . Shingles   . SHINGLES 11/26/2009  . Thoracic aneurysm without mention of rupture    followed by Dr.  Servando Snare with yearly CT scans   . Unspecified essential hypertension     PAST SURGICAL HISTORY:   Past Surgical History:  Procedure Laterality Date  . AORTIC VALVE REPLACEMENT  1991    st. jude's valve, per [name]   . COLONOSCOPY  06-28-10   per Dr. Henrene Pastor, benign polyps, no further scopes needed   . HEMORRHOID SURGERY    . INGUINAL HERNIA REPAIR Right 11/18/2016   Procedure: OPEN REPAIR RIGHT INGUINAL HERNIA WITH MESH;  Surgeon: Fanny Skates, MD;  Location: [facility];  Service: General;  Laterality: Right;  . INSERTION OF MESH Right 11/18/2016   Procedure: INSERTION OF MESH;  Surgeon: Fanny Skates, MD;  Location: [facility];  Service: General;  Laterality: Right;  . PROSTATECTOMY  1995   per Dr. Roni Bread   . TONSILLECTOMY      SOCIAL HISTORY:   Social History   Socioeconomic History  . Marital status: Married    Spouse name: Not on file  . Number of children: 0  . Years of education: 66  . Highest education level: Not on file  Occupational History  . Occupation: retired    Comment: [employer], production  Social Needs  . Financial resource strain: Not on file  . Food insecurity:    Worry: Not on file    Inability: Not on file  . Transportation needs:    Medical: Not on file    Non-medical: Not on file  Tobacco Use  . Smoking status: Former Smoker    Packs/day: 0.50    Years: 10.00    Pack years: 5.00    Types: Cigarettes    Last attempt to quit: 02/06/1972    Years since quitting: 46.0  . Smokeless tobacco: Never Used  Substance and Sexual Activity  . Alcohol use: Yes    Alcohol/week: 7.0 standard drinks    Types: 7 Standard drinks or equivalent per week    Comment: glass of wine daily; just about   . Drug use: No  . Sexual activity: Never  Lifestyle  . Physical activity:    Days per week: Not on file    Minutes per session: Not on file  . Stress: Not on file  Relationships  . Social connections:    Talks on  phone: Not on file    Gets together: Not on file    Attends religious service: Not on file    Active member of club or organization: Not on file    Attends meetings of clubs or organizations: Not on file    Relationship status: Not on file  . Intimate partner violence:    Fear of current or ex partner: Not on file    Emotionally abused: Not on file  Physically abused: Not on file    Forced sexual activity: Not on file  Other Topics Concern  . Not on file  Social History Narrative   HSG, a couple of years of college. Married '78 - . No children. Work - [employer] 71 years - retired.   Lives alone with wife and two dogs. ACP - Yes CPR, Yes - short-term mechanical ventilation. Does not want prolonged heroic measures in the face of irreversible disease. HCPOA - wife. Alternative is his brother: Yahshua Thibault [facility].     FAMILY HISTORY:   Family Status  Relation Name Status  . Father  Deceased at age 53  . Brother  Deceased  . Brother  Deceased  . Mother  Deceased at age 25       CAD, MI-fatal, DM  . Brother  Deceased       DM  . Sister  Deceased       born 81  . Brother  Deceased  . Brother  Alive  . Sister  Deceased       born 14  . MGM  Deceased  . MGF  Other  . PGM  Deceased  . PGF  Deceased  . Neg Hx  (Not Specified)    ROS:  A complete 10 system review of systems was obtained and was unremarkable apart from what is mentioned above.  PHYSICAL EXAMINATION:    VITALS:   Vitals:   01/31/18 1455  BP: 134/60  Pulse: 74  SpO2: 96%  Weight: 142 lb (64.4 kg)  Height: 5\' 10"  (1.778 m)   GEN:  The patient appears stated age and is in NAD. HEENT:  Normocephalic, atraumatic.  The mucous membranes are moist. The superficial temporal arteries are without ropiness or tenderness. CV:  RRR Lungs:  CTAB Neck/HEME:  There are no carotid bruits bilaterally.  Neurological examination:  Orientation: The patient is alert and oriented x3. Cranial nerves: There is  good facial symmetry. The speech is fluent and clear. Soft palate rises symmetrically and there is no tongue deviation. Hearing is intact to conversational tone. Sensation: Sensation is intact to light touch throughout Motor: Strength is 5/5 in the bilateral upper and lower extremities.   Shoulder shrug is equal and symmetric.  There is no pronator drift.   Movement examination: Tone: There is mild to moderate increased tone in the left upper extremity.  Tone is mild increased in the RUE Abnormal movements: There is near constant mild to moderate left upper extremity resting tremor. Coordination:  There is no decremation, with any form of RAMS, including alternating supination and pronation of the forearm, hand opening and closing, finger taps, heel taps and toe taps. Gait and Station: The patient has no difficulty arising out of a deep-seated chair without the use of the hands. The patient's stride length is normal with good arm swing.       LABS:  Lab Results  Component Value Date   TSH 0.40 05/22/2017     Chemistry      Component Value Date/Time   NA 140 02/16/2017 0817   NA 142 07/08/2016 1449   K 4.3 02/16/2017 0817   CL 102 02/16/2017 0817   CO2 34 (H) 02/16/2017 0817   BUN 21 02/16/2017 0817   BUN 26 07/08/2016 1449   CREATININE 1.06 02/16/2017 0817   CREATININE 1.09 07/30/2014 1400      Component Value Date/Time   CALCIUM 9.2 02/16/2017 0817   ALKPHOS 89  02/16/2017 0817   AST 19 02/16/2017 0817   ALT 20 02/16/2017 0817   BILITOT 1.8 (H) 02/16/2017 0817   BILITOT 1.7 (H) 07/08/2016 1449       ASSESSMENT/PLAN:  1.  Idiopathic tremor predominant Parkinson's disease  -Talked again about the value of going on medication especially given rigidity in arms.  Patient really does not want this.  Fortunately, he has good balance.  He will let me know if he changes mind about medication.  He will continue to exercise with rock steady boxing.  2. F/u 6 months.     Cc:   [name], MD

## 2018-01-31 ENCOUNTER — Encounter: Payer: Self-pay | Admitting: Neurology

## 2018-01-31 ENCOUNTER — Ambulatory Visit: Payer: Medicare Other | Admitting: Neurology

## 2018-01-31 VITALS — BP 134/60 | HR 74 | Ht 70.0 in | Wt 142.0 lb

## 2018-01-31 DIAGNOSIS — G20 Parkinson's disease: Secondary | ICD-10-CM

## 2018-02-01 ENCOUNTER — Ambulatory Visit: Payer: Medicare Other

## 2018-02-01 DIAGNOSIS — Z5181 Encounter for therapeutic drug level monitoring: Secondary | ICD-10-CM

## 2018-02-01 DIAGNOSIS — Z952 Presence of prosthetic heart valve: Secondary | ICD-10-CM | POA: Diagnosis not present

## 2018-02-01 LAB — POCT INR: INR: 3 (ref 2.0–3.0)

## 2018-02-01 NOTE — Patient Instructions (Signed)
Description   Continue taking 1.5 tablets every day except 2 tablets on Wednesdays and Saturdays.  Coumadin Clinic # [phone].Call with any concerns  Recheck INR in 6 weeks.

## 2018-02-23 ENCOUNTER — Encounter: Payer: Self-pay | Admitting: Family Medicine

## 2018-02-23 ENCOUNTER — Ambulatory Visit (INDEPENDENT_AMBULATORY_CARE_PROVIDER_SITE_OTHER): Payer: Medicare Other | Admitting: Family Medicine

## 2018-02-23 VITALS — BP 110/64 | HR 69 | Temp 97.9°F | Ht 69.0 in | Wt 143.5 lb

## 2018-02-23 DIAGNOSIS — I10 Essential (primary) hypertension: Secondary | ICD-10-CM | POA: Diagnosis not present

## 2018-02-23 DIAGNOSIS — E039 Hypothyroidism, unspecified: Secondary | ICD-10-CM

## 2018-02-23 DIAGNOSIS — K5909 Other constipation: Secondary | ICD-10-CM

## 2018-02-23 DIAGNOSIS — Z952 Presence of prosthetic heart valve: Secondary | ICD-10-CM | POA: Diagnosis not present

## 2018-02-23 DIAGNOSIS — E538 Deficiency of other specified B group vitamins: Secondary | ICD-10-CM

## 2018-02-23 DIAGNOSIS — E785 Hyperlipidemia, unspecified: Secondary | ICD-10-CM | POA: Diagnosis not present

## 2018-02-23 DIAGNOSIS — G20 Parkinson's disease: Secondary | ICD-10-CM | POA: Diagnosis not present

## 2018-02-23 NOTE — Progress Notes (Signed)
Subjective:    Patient ID: David Benton, male    DOB: September 13, 1929, 85 y.o.   MRN: [id number]  HPI Here to follow up on issues. He feels well. He sees Cardiology and Neurology regularly. He plays golf if it is warm enough and he attends a boxing class twice a week. He and his wife recently moved to a townhouse and he is enjoying not having to worry about keeping up a yard.    Review of Systems  Constitutional: Negative.   HENT: Negative.   Eyes: Negative.   Respiratory: Negative.   Cardiovascular: Negative.   Gastrointestinal: Negative.   Genitourinary: Negative.   Musculoskeletal: Negative.   Skin: Negative.   Neurological: Positive for tremors.  Psychiatric/Behavioral: Negative.        Objective:   Physical Exam Constitutional:      General: He is not in acute distress.    Appearance: He is well-developed. He is not diaphoretic.  HENT:     Head: Normocephalic and atraumatic.     Right Ear: External ear normal.     Left Ear: External ear normal.     Nose: Nose normal.     Mouth/Throat:     Pharynx: No oropharyngeal exudate.  Eyes:     General: No scleral icterus.       Right eye: No discharge.        Left eye: No discharge.     Conjunctiva/sclera: Conjunctivae normal.     Pupils: Pupils are equal, round, and reactive to light.  Neck:     Musculoskeletal: Neck supple.     Thyroid: No thyromegaly.     Vascular: No JVD.     Trachea: No tracheal deviation.  Cardiovascular:     Rate and Rhythm: Normal rate and regular rhythm.     Heart sounds: Normal heart sounds. No murmur. No friction rub. No gallop.   Pulmonary:     Effort: Pulmonary effort is normal. No respiratory distress.     Breath sounds: Normal breath sounds. No wheezing or rales.  Chest:     Chest wall: No tenderness.  Abdominal:     General: Bowel sounds are normal. There is no distension.     Palpations: Abdomen is soft. There is no mass.     Tenderness: There is no abdominal tenderness. There is no  guarding or rebound.  Genitourinary:    Penis: No tenderness.   Musculoskeletal: Normal range of motion.        General: No tenderness.  Lymphadenopathy:     Cervical: No cervical adenopathy.  Skin:    General: Skin is warm and dry.     Coloration: Skin is not pale.     Findings: No erythema or rash.  Neurological:     Mental Status: He is alert and oriented to person, place, and time.     Cranial Nerves: No cranial nerve deficit.     Motor: No abnormal muscle tone.     Coordination: Coordination normal.     Deep Tendon Reflexes: Reflexes are normal and symmetric. Reflexes normal.     Comments: Resting tremors in the left foot and left hand   Psychiatric:        Behavior: Behavior normal.        Thought Content: Thought content normal.        Judgment: Judgment normal.           Assessment & Plan:  He seems to be doing well. His Parkinsons is  stable. His valvular disease and HTN are stable. He gets his INR checked regularly. We will get fasting labs soon to check lipids, a full thyroid panel, etc. Given a rx for the Shingrix vaccine. Alysia Penna, MD

## 2018-02-26 ENCOUNTER — Other Ambulatory Visit (INDEPENDENT_AMBULATORY_CARE_PROVIDER_SITE_OTHER): Payer: Medicare Other

## 2018-02-26 DIAGNOSIS — E039 Hypothyroidism, unspecified: Secondary | ICD-10-CM | POA: Diagnosis not present

## 2018-02-26 DIAGNOSIS — I10 Essential (primary) hypertension: Secondary | ICD-10-CM

## 2018-02-26 DIAGNOSIS — E538 Deficiency of other specified B group vitamins: Secondary | ICD-10-CM | POA: Diagnosis not present

## 2018-02-26 LAB — VITAMIN B12: Vitamin B-12: 273 pg/mL (ref 211–911)

## 2018-02-26 LAB — POC URINALSYSI DIPSTICK (AUTOMATED)
Bilirubin, UA: NEGATIVE
Blood, UA: NEGATIVE
Glucose, UA: NEGATIVE
Ketones, UA: NEGATIVE
Leukocytes, UA: NEGATIVE
Nitrite, UA: NEGATIVE
Protein, UA: NEGATIVE
Spec Grav, UA: 1.015
Urobilinogen, UA: 0.2 U/dL
pH, UA: 8

## 2018-02-26 LAB — BASIC METABOLIC PANEL
BUN: 21 mg/dL (ref 6–23)
CHLORIDE: 100 meq/L (ref 96–112)
CO2: 32 mEq/L (ref 19–32)
Calcium: 9.6 mg/dL (ref 8.4–10.5)
Creatinine, Ser: 0.97 mg/dL (ref 0.40–1.50)
GFR: 73.29 mL/min (ref >60.00)
GLUCOSE: 102 mg/dL — AB (ref 70–99)
Potassium: 4.2 mEq/L (ref 3.5–5.1)
SODIUM: 139 meq/L (ref 135–145)

## 2018-02-26 LAB — LIPID PANEL
CHOL/HDL RATIO: 3
CHOLESTEROL: 135 mg/dL (ref 0–200)
HDL: 43.9 mg/dL (ref >39.00)
LDL Cholesterol: 77 mg/dL (ref 0–99)
NONHDL: 91.38
Triglycerides: 73 mg/dL (ref 0.0–149.0)
VLDL: 14.6 mg/dL (ref 0.0–40.0)

## 2018-02-26 LAB — HEPATIC FUNCTION PANEL
ALT: 13 U/L (ref 0–53)
AST: 18 U/L (ref 0–37)
Albumin: 4.2 g/dL (ref 3.5–5.2)
Alkaline Phosphatase: 101 U/L (ref 39–117)
Bilirubin, Direct: 0.3 mg/dL (ref 0.0–0.3)
Total Bilirubin: 1.9 mg/dL — ABNORMAL HIGH (ref 0.2–1.2)
Total Protein: 6.5 g/dL (ref 6.0–8.3)

## 2018-02-26 LAB — T4, FREE: Free T4: 1.06 ng/dL (ref 0.60–1.60)

## 2018-02-26 LAB — CBC WITH DIFFERENTIAL/PLATELET
BASOS ABS: 0 10*3/uL (ref 0.0–0.1)
BASOS PCT: 0.7 % (ref 0.0–3.0)
EOS ABS: 0.2 10*3/uL (ref 0.0–0.7)
Eosinophils Relative: 4.8 % (ref 0.0–5.0)
HCT: 40.7 % (ref 39.0–52.0)
Hemoglobin: 14.2 g/dL (ref 13.0–17.0)
LYMPHS ABS: 1 10*3/uL (ref 0.7–4.0)
Lymphocytes Relative: 20.9 % (ref 12.0–46.0)
MCHC: 34.9 g/dL (ref 30.0–36.0)
MCV: 88.6 fl (ref 78.0–100.0)
MONO ABS: 0.7 10*3/uL (ref 0.1–1.0)
Monocytes Relative: 13.8 % — ABNORMAL HIGH (ref 3.0–12.0)
NEUTROS ABS: 2.9 10*3/uL (ref 1.4–7.7)
NEUTROS PCT: 59.8 % (ref 43.0–77.0)
PLATELETS: 222 10*3/uL (ref 150.0–400.0)
RBC: 4.59 Mil/uL (ref 4.22–5.81)
RDW: 13.8 % (ref 11.5–15.5)
WBC: 4.8 10*3/uL (ref 4.0–10.5)

## 2018-02-26 LAB — TSH: TSH: 1.12 u[IU]/mL (ref 0.35–4.50)

## 2018-02-26 LAB — T3, FREE: T3, Free: 3.2 pg/mL (ref 2.3–4.2)

## 2018-03-14 ENCOUNTER — Ambulatory Visit: Payer: Medicare Other | Admitting: Pharmacist

## 2018-03-14 DIAGNOSIS — Z5181 Encounter for therapeutic drug level monitoring: Secondary | ICD-10-CM | POA: Diagnosis not present

## 2018-03-14 LAB — POCT INR: INR: 3.1 — AB (ref 2.0–3.0)

## 2018-03-14 NOTE — Patient Instructions (Signed)
Description   Continue taking 1.5 tablets every day except 2 tablets on Wednesdays and Saturdays.  [facility] # [phone].Call with any concerns  Recheck INR in 6 weeks.

## 2018-03-15 ENCOUNTER — Other Ambulatory Visit: Payer: Self-pay | Admitting: Internal Medicine

## 2018-03-15 NOTE — Telephone Encounter (Signed)
Refill Request.  

## 2018-03-22 DIAGNOSIS — L570 Actinic keratosis: Secondary | ICD-10-CM | POA: Diagnosis not present

## 2018-03-22 DIAGNOSIS — L308 Other specified dermatitis: Secondary | ICD-10-CM | POA: Diagnosis not present

## 2018-03-22 DIAGNOSIS — X32XXXD Exposure to sunlight, subsequent encounter: Secondary | ICD-10-CM | POA: Diagnosis not present

## 2018-04-11 ENCOUNTER — Other Ambulatory Visit: Payer: Self-pay | Admitting: *Deleted

## 2018-04-11 ENCOUNTER — Other Ambulatory Visit: Payer: Self-pay | Admitting: Internal Medicine

## 2018-04-11 MED ORDER — WARFARIN SODIUM 5 MG PO TABS: ORAL_TABLET | ORAL | 3 refills | Status: DC

## 2018-04-11 NOTE — Telephone Encounter (Signed)
Please review for refill.  

## 2018-04-24 ENCOUNTER — Telehealth: Payer: Self-pay

## 2018-04-24 NOTE — Telephone Encounter (Signed)

## 2018-04-25 ENCOUNTER — Ambulatory Visit (INDEPENDENT_AMBULATORY_CARE_PROVIDER_SITE_OTHER): Payer: Medicare Other | Admitting: Pharmacist

## 2018-04-25 ENCOUNTER — Other Ambulatory Visit: Payer: Self-pay

## 2018-04-25 DIAGNOSIS — Z5181 Encounter for therapeutic drug level monitoring: Secondary | ICD-10-CM | POA: Diagnosis not present

## 2018-04-25 LAB — POCT INR: INR: 3.2 — AB (ref 2.0–3.0)

## 2018-05-14 ENCOUNTER — Other Ambulatory Visit: Payer: Self-pay | Admitting: Family Medicine

## 2018-05-23 ENCOUNTER — Telehealth: Payer: Self-pay | Admitting: *Deleted

## 2018-05-23 NOTE — Telephone Encounter (Signed)
Called to r/s awv for July due to [place] message was left

## 2018-05-30 ENCOUNTER — Ambulatory Visit: Payer: Medicare Other

## 2018-06-06 ENCOUNTER — Ambulatory Visit: Payer: Medicare Other

## 2018-06-18 ENCOUNTER — Telehealth: Payer: Self-pay

## 2018-06-18 NOTE — Telephone Encounter (Signed)
lmom for prescreen  

## 2018-06-18 NOTE — Telephone Encounter (Signed)

## 2018-06-20 ENCOUNTER — Other Ambulatory Visit: Payer: Self-pay

## 2018-06-20 ENCOUNTER — Ambulatory Visit: Payer: Medicare Other | Admitting: *Deleted

## 2018-06-20 DIAGNOSIS — Z5181 Encounter for therapeutic drug level monitoring: Secondary | ICD-10-CM | POA: Diagnosis not present

## 2018-06-20 LAB — POCT INR: INR: 3.1 — AB (ref 2.0–3.0)

## 2018-06-20 NOTE — Patient Instructions (Signed)
Description   Continue taking 1.5 tablets every day except 2 tablets on Wednesdays and Saturdays.  [facility] # [phone].Call with any concerns  Recheck INR in 8 weeks.

## 2018-06-26 ENCOUNTER — Other Ambulatory Visit: Payer: Self-pay | Admitting: Family Medicine

## 2018-07-03 NOTE — Telephone Encounter (Signed)
Dr. Sarajane Jews please advise. On refill of RX.  Thanks

## 2018-07-03 NOTE — Telephone Encounter (Signed)
Please okay this with the pharmacy

## 2018-07-12 NOTE — Progress Notes (Signed)
    Virtual Visit via Telephone Note The purpose of this virtual visit is to provide medical care while limiting exposure to the novel coronavirus.    Consent was obtained for phone visit:  Yes.   Answered questions that patient had about telehealth interaction:  Yes.   I discussed the limitations, risks, security and privacy concerns of performing an evaluation and management service by telephone. I also discussed with the patient that there may be a patient responsible charge related to this service. The patient expressed understanding and agreed to proceed.  Pt location: Home Physician Location: home Name of referring provider:  [name], MD I connected with .Owens Loffler at patients initiation/request on 07/13/2018 at  9:45 AM EDT by telephone and verified that I am speaking with the correct person using two identifiers.  Pt MRN:  [id number] Pt DOB:  [birth year]   History of Present Illness:  Patient seen today in follow-up for Parkinson's disease.  He is on no medication, by his choice.  "I've been doing very good."  "I do a daily exercise program at my house."  Pt denies falls.  Pt denies lightheadedness, near syncope.  No hallucinations.  Mood has been good.  Last primary care appointment was on February 7.  Those notes are reviewed.   Observations/Objective:   Vitals:   07/13/18 0802  Weight: 145 lb (65.8 kg)  Height: 5\' 10"  (1.778 m)      Chemistry      Component Value Date/Time   NA 139 02/26/2018 0851   NA 142 07/08/2016 1449   K 4.2 02/26/2018 0851   CL 100 02/26/2018 0851   CO2 32 02/26/2018 0851   BUN 21 02/26/2018 0851   BUN 26 07/08/2016 1449   CREATININE 0.97 02/26/2018 0851   CREATININE 1.09 07/30/2014 1400      Component Value Date/Time   CALCIUM 9.6 02/26/2018 0851   ALKPHOS 101 02/26/2018 0851   AST 18 02/26/2018 0851   ALT 13 02/26/2018 0851   BILITOT 1.9 (H) 02/26/2018 0851   BILITOT 1.7 (H) 07/08/2016 1449     Lab Results  Component Value  Date   TSH 1.12 02/26/2018     Assessment and Plan:   1.  Parkinson's disease  -Discussed again the value of medication.  Patient has had rigidity in his arms when I have previously examined him.  He states that "I have thought about it a lot and I am not ready for it yet."  -Congratulated him on exercise and encouraged him to continue to do so.  Follow Up Instructions:  6 months  -I discussed the assessment and treatment plan with the patient. The patient was provided an opportunity to ask questions and all were answered. The patient agreed with the plan and demonstrated an understanding of the instructions.   The patient was advised to call back or seek an in-person evaluation if the symptoms worsen or if the condition fails to improve as anticipated.    Total Time spent in visit with the patient was:  8 min, of which 100% of the time was spent in counseling on advantages/disadvantages of taking medicine.   Pt understands and agrees with the plan of care outlined.     Alonza Bogus, DO

## 2018-07-13 ENCOUNTER — Telehealth (INDEPENDENT_AMBULATORY_CARE_PROVIDER_SITE_OTHER): Payer: Medicare Other | Admitting: Neurology

## 2018-07-13 ENCOUNTER — Other Ambulatory Visit: Payer: Self-pay

## 2018-07-13 ENCOUNTER — Encounter: Payer: Self-pay | Admitting: Neurology

## 2018-07-13 DIAGNOSIS — G20A1 Parkinson's disease without dyskinesia, without mention of fluctuations: Secondary | ICD-10-CM

## 2018-07-13 DIAGNOSIS — G20 Parkinson's disease: Secondary | ICD-10-CM

## 2018-07-16 ENCOUNTER — Other Ambulatory Visit: Payer: Self-pay | Admitting: Internal Medicine

## 2018-07-16 NOTE — Telephone Encounter (Signed)
Please review for refill. Thanks!  

## 2018-07-26 ENCOUNTER — Other Ambulatory Visit: Payer: Self-pay | Admitting: *Deleted

## 2018-07-26 DIAGNOSIS — I712 Thoracic aortic aneurysm, without rupture, unspecified: Secondary | ICD-10-CM

## 2018-07-26 NOTE — Progress Notes (Unsigned)
Ct a 

## 2018-08-02 ENCOUNTER — Ambulatory Visit: Payer: Medicare Other | Admitting: Neurology

## 2018-08-07 ENCOUNTER — Encounter: Payer: Self-pay | Admitting: Family Medicine

## 2018-08-08 ENCOUNTER — Ambulatory Visit: Payer: Medicare Other

## 2018-08-14 ENCOUNTER — Telehealth: Payer: Self-pay | Admitting: Pharmacist

## 2018-08-14 NOTE — Telephone Encounter (Signed)
Left VM for patient to return call for COVID screen

## 2018-08-15 ENCOUNTER — Other Ambulatory Visit: Payer: Self-pay

## 2018-08-15 ENCOUNTER — Other Ambulatory Visit: Payer: Self-pay | Admitting: Family Medicine

## 2018-08-15 ENCOUNTER — Ambulatory Visit (INDEPENDENT_AMBULATORY_CARE_PROVIDER_SITE_OTHER): Payer: Medicare Other | Admitting: *Deleted

## 2018-08-15 DIAGNOSIS — Z5181 Encounter for therapeutic drug level monitoring: Secondary | ICD-10-CM

## 2018-08-15 LAB — POCT INR: INR: 3.9 — AB (ref 2.0–3.0)

## 2018-08-15 NOTE — Patient Instructions (Signed)
Description   Do not take any Coumadin today then continue taking 1.5 tablets every day except 2 tablets on Wednesdays and Saturdays.  [facility] # [phone].Call with any concerns  Recheck INR in 5 weeks.

## 2018-09-13 ENCOUNTER — Ambulatory Visit
Admission: RE | Admit: 2018-09-13 | Discharge: 2018-09-13 | Disposition: A | Discharge status: Home / Self Care | Payer: Medicare Other | Source: Ambulatory Visit | Attending: Cardiothoracic Surgery | Admitting: Cardiothoracic Surgery

## 2018-09-13 ENCOUNTER — Other Ambulatory Visit: Payer: Self-pay

## 2018-09-13 ENCOUNTER — Ambulatory Visit: Payer: Medicare Other | Admitting: Cardiothoracic Surgery

## 2018-09-13 ENCOUNTER — Encounter: Payer: Self-pay | Admitting: Cardiothoracic Surgery

## 2018-09-13 VITALS — BP 128/71 | HR 73 | Temp 97.0°F | Resp 16 | Ht 70.0 in | Wt 142.0 lb

## 2018-09-13 DIAGNOSIS — I712 Thoracic aortic aneurysm, without rupture, unspecified: Secondary | ICD-10-CM

## 2018-09-13 DIAGNOSIS — Z7901 Long term (current) use of anticoagulants: Secondary | ICD-10-CM | POA: Diagnosis not present

## 2018-09-13 DIAGNOSIS — Z952 Presence of prosthetic heart valve: Secondary | ICD-10-CM | POA: Diagnosis not present

## 2018-09-13 MED ORDER — IOPAMIDOL (ISOVUE-370) INJECTION 76%
75.0000 mL | Freq: Once | INTRAVENOUS | Status: AC | PRN
  Administered 2018-09-13: 75 mL via INTRAVENOUS

## 2018-09-13 NOTE — Progress Notes (Signed)
[address]       [place] [address]             [phone]               [facility] Medical Record [id number] Date of Birth: 21-Dec-1929  Referring:  Dr Einar Crow Primary Care: [name], MD  Chief Complaint:    Chief Complaint  Patient presents with  . Thoracic Aortic Aneurysm    1 year f/u with Chest CTA    History of Present Illness:    Patient returns to the office today in followup after aortic valve replacement with a 29 mm St. Jude mechanical valve (29A-101  SN [id number]) in August of 1991 by Dr Redmond Pulling for aortic stenosis and bicuspid aortic valve. He was seen in April of 2009 because of aortic dilation on echocardiogram.  Since that time he's had serial CT scans to evaluate the size of his aorta . At age 85 he remains active exercising daily  He continues on Coumadin without evidence of significant bleeding complication . As August the patient is now 31  years following his aortic valve replacement.  In spite of his Parkinson's continues to be active, notes today he is doing resume boxing classes to assist in his physical therapy related to his Parkinson's  Current Activity/ Functional Status: Patient is  independent with mobility/ambulation, transfers, ADL's, IADL's.   Past Medical History:  Diagnosis Date  . AVD (aortic valve disease) 02/25/2010   sees [name]   . External hemorrhoids without mention of complication   . Heart valve replaced by other means   . Long term (current) use of anticoagulants 09/15/2010  . Meniere's disease   . Other and unspecified hyperlipidemia   . Other constipation   . Parkinson disease ([place]) 02/2016   no meds, sees [name]   . Personal history of colonic polyps   . Personal history of malignant neoplasm of prostate   . Right inguinal hernia 11/18/2016  . Shingles   . SHINGLES 11/26/2009  . Thoracic aneurysm without mention of rupture    followed by [name] with yearly CT  scans   . Unspecified essential hypertension     Past Surgical History:  Procedure Laterality Date  . AORTIC VALVE REPLACEMENT  1991    st. jude's valve, per [name]   . COLONOSCOPY  06-28-10   per Dr. Henrene Pastor, benign polyps, no further scopes needed   . HEMORRHOID SURGERY    . INGUINAL HERNIA REPAIR Right 11/18/2016   Procedure: OPEN REPAIR RIGHT INGUINAL HERNIA WITH MESH;  Surgeon: Fanny Skates, MD;  Location: [facility];  Service: General;  Laterality: Right;  . INSERTION OF MESH Right 11/18/2016   Procedure: INSERTION OF MESH;  Surgeon: Fanny Skates, MD;  Location: [facility];  Service: General;  Laterality: Right;  . PROSTATECTOMY  1995   per Dr. Roni Bread   . TONSILLECTOMY      Family History  Problem Relation Age of Onset  . Heart attack Father 72       fatal at 29  . Hypertension Father   . CAD Father   . Other Father        possible PD as says had "palsy"  . Prostate cancer Brother   . Diabetes Brother   . Heart attack Mother   . Diabetes Brother   . Stroke Brother   . Heart  disease Brother        carotid artery disease  . Hypertension Brother   . Breast cancer Sister        mastectomy at 41  . Colon cancer Neg Hx     Social History   Socioeconomic History  . Marital status: Married    Spouse name: Not on file  . Number of children: 0  . Years of education: 74  . Highest education level: Not on file  Occupational History  . Occupation: retired    Comment: [employer], production  Social Needs  . Financial resource strain: Not on file  . Food insecurity    Worry: Not on file    Inability: Not on file  . Transportation needs    Medical: Not on file    Non-medical: Not on file  Tobacco Use  . Smoking status: Former Smoker    Packs/day: 0.50    Years: 10.00    Pack years: 5.00    Types: Cigarettes    Quit date: 02/06/1972    Years since quitting: 46.6  . Smokeless tobacco: Never Used  Substance and Sexual  Activity  . Alcohol use: Yes    Alcohol/week: 7.0 standard drinks    Types: 7 Standard drinks or equivalent per week    Comment: glass of wine daily; just about   . Drug use: No  . Sexual activity: Never  Lifestyle  . Physical activity    Days per week: Not on file    Minutes per session: Not on file  . Stress: Not on file  Relationships  . Social Herbalist on phone: Not on file    Gets together: Not on file    Attends religious service: Not on file    Active member of club or organization: Not on file    Attends meetings of clubs or organizations: Not on file    Relationship status: Not on file  . Intimate partner violence    Fear of current or ex partner: Not on file    Emotionally abused: Not on file    Physically abused: Not on file    Forced sexual activity: Not on file  Other Topics Concern  . Not on file  Social History Narrative   HSG, a couple of years of college. Married '78 - . No children. Work - [employer] 15 years - retired.   Lives alone with wife and two dogs. ACP - Yes CPR, Yes - short-term mechanical ventilation. Does not want prolonged heroic measures in the face of irreversible disease. HCPOA - wife. Alternative is his brother: Lexie Swenson [facility].     Social History   Tobacco Use  Smoking Status Former Smoker  . Packs/day: 0.50  . Years: 10.00  . Pack years: 5.00  . Types: Cigarettes  . Quit date: 02/06/1972  . Years since quitting: 46.6  Smokeless Tobacco Never Used    Social History   Substance and Sexual Activity  Alcohol Use Yes  . Alcohol/week: 7.0 standard drinks  . Types: 7 Standard drinks or equivalent per week   Comment: glass of wine daily; just about      Allergies  Allergen Reactions  . Codeine     REACTION: N/V    Current Outpatient Medications  Medication Sig Dispense Refill  . acetaminophen (TYLENOL) 325 MG tablet Take 650 mg by mouth every 6 (six) hours as needed for pain.    Marland Kitchen amoxicillin (  AMOXIL)  500 MG capsule Take 4 tablets (2,000mg ) 1 hour prior to dental visits. 12 capsule 1  . atorvastatin (LIPITOR) 40 MG tablet TAKE 1 TABLET BY MOUTH EVERY DAY 30 tablet 11  . levothyroxine (SYNTHROID) 75 MCG tablet TAKE 1 TABLET BY MOUTH EVERY DAY 90 tablet 0  . magnesium hydroxide (MILK OF MAGNESIA) 400 MG/5ML suspension Take 30 mLs by mouth daily as needed for indigestion. Reported on 04/21/2015    . Probiotic Product (PROBIOTIC-10 ULTIMATE) CAPS Take 1 capsule by mouth daily.    . sodium chloride (OCEAN) 0.65 % SOLN nasal spray Place 1 spray into both nostrils as needed for congestion. Reported on 04/21/2015    . triamterene-hydrochlorothiazide (MAXZIDE-25) 37.5-25 MG tablet TAKE 1 TABLET BY MOUTH EVERY MORNING 30 tablet 6  . warfarin (COUMADIN) 5 MG tablet TAKE AS DIRECTED BY COUMADIN CLINIC FOR 30 DAYS 50 tablet 3   No current facility-administered medications for this visit.        Review of Systems:     Cardiac Review of Systems: Y or N  Chest Pain [ N]  Resting SOB Aqua.Slicker ] Exertional SOB  [  N]  Orthopnea Aqua.Slicker  ]   Pedal Edema [ N ]    Palpitations N ] Syncope  Aqua.Slicker ]   Presyncope Aqua.Slicker  ]  General Review of Systems: [Y] = yes [  ]=no Constitional: recent weight change [  ]; anorexia [  ]; fatigue [  ]; nausea [  ]; night sweats [  ]; fever [  ]; or chills [  ];                                                                                                                                          Dental: poor dentition[  ]; Last Dentist visit:FALL 2013   Eye : blurred vision [  ]; diplopia [   ]; vision changes [  ];  Amaurosis fugax[  ]; Resp: cough [ ] ;  wheezing[ ] ;  hemoptysis[ ] ; shortness of breath[  ]; paroxysmal nocturnal dyspnea[  ]; dyspnea on exertion[  ]; or orthopnea[  ];  GI:  gallstones[  ], vomiting[  ];  dysphagia[  ]; melena[  ];  hematochezia [  ]; heartburn[  ];   Hx of  Colonoscopy[ NOT NEEDED ANY MORE ]; GU: kidney stones [  ]; hematuria[  ];   dysuria [  ];  nocturia[  ];   history of     obstruction [  ];             Skin: rash, swelling[  ];, hair loss[  ];  peripheral edema[  ];  or itching[  ]; Musculosketetal: myalgias[  ];  joint swelling[  ];  joint erythema[  ];  joint pain[  ];  back pain[  ];  Heme/Lymph: bruising[  ];  bleeding[  ];  anemia[  ];  Neuro: TIA[  ];  headaches[  ];  stroke[  ];  vertigo[  ];  seizures[  ];   paresthesias[  ];  difficulty walking[  ];tremour left hand  Psych:depression[  ]; anxiety[  ];  Endocrine: diabetes[  ];  thyroid dysfunction[  ];  Immunizations: Flu [ y  ]; Pneumococcal[ y   ];  Other:  Physical Exam: BP 128/71 (BP Location: Right Arm, Patient Position: Sitting, Cuff Size: Normal)   Pulse 73   Temp (!) 97 F (36.1 C)   Resp 16   Ht 5\' 10"  (1.778 m)   Wt 142 lb (64.4 kg)   SpO2 96% Comment: RA  BMI 20.37 kg/m  General appearance: alert, cooperative and no distress Head: Normocephalic, without obvious abnormality, atraumatic Neck: no adenopathy, no carotid bruit, no JVD, supple, symmetrical, trachea midline and thyroid not enlarged, symmetric, no tenderness/mass/nodules Resp: clear to auscultation bilaterally Cardio: regular rate and rhythm, S1, S2 normal, no murmur, click, rub or gallop GI: soft, non-tender; bowel sounds normal; no masses,  no organomegaly Extremities: extremities normal, atraumatic, no cyanosis or edema and Homans sign is negative, no sign of DVT Neurologic: Grossly normal Sternum stable valve sounds are crisp,  Diagnostic Studies & Laboratory data:     Recent Radiology Findings: Ct Angio Chest Aorta W &/or Wo Contrast  Result Date: 09/13/2018 CLINICAL DATA:  Ascending thoracic aortic prominence EXAM: CT ANGIOGRAPHY CHEST WITH CONTRAST TECHNIQUE: Multidetector CT imaging of the chest was performed using the standard protocol during bolus administration of intravenous contrast. Multiplanar CT image reconstructions and MIPs were obtained to evaluate the vascular anatomy. CONTRAST:  63mL  ISOVUE-370 IOPAMIDOL (ISOVUE-370) INJECTION 76% COMPARISON:  September 14, 2017 FINDINGS: Cardiovascular: Ascending thoracic aortic diameter measures 4.6 x 4.5 cm, stable. No evident dissection. The measured value at the sinus of Valsalva measures 4.3 cm. The measured diameter in the aortic arch region measures 3.2 cm. The measured diameter of the descending thoracic aorta at the main pulmonary outflow tract level measures 3.2 x 3.1 cm. There is no evident thoracic aortic dissection. Visualized great vessels show scattered foci of calcification. There are foci of aortic atherosclerosis. Patient is status post aortic valve replacement. There are foci of coronary artery calcification. There is cardiomegaly. No pericardial effusion or pericardial thickening evident. There is no demonstrable pulmonary embolus. Mediastinum/Nodes: Thyroid appears unremarkable. There is no appreciable thoracic adenopathy. No esophageal lesions are evident. Lungs/Pleura: There is mild scarring in the extreme apices. There is mild bibasilar atelectasis. There is no edema or consolidation. No evident pleural effusion. Upper Abdomen: There is abdominal aortic atherosclerosis. There is scarring in the upper pole left kidney region. Visualized upper abdominal structures otherwise appear unremarkable. Musculoskeletal: Patient is status post median sternotomy. There are no blastic or lytic bone lesions. There is thoracic dextroscoliosis. There are no evident chest wall lesions. Review of the MIP images confirms the above findings. IMPRESSION: 1. Ascending thoracic aortic diameter is stable, measuring 4.6 x 4.5 cm. No evident dissection. There are foci of aortic atherosclerosis as well as great vessel and coronary artery calcification. Patient is status post aortic valve replacement. Ascending thoracic aortic aneurysm. Recommend semi-annual imaging followup by CTA or MRA and referral to cardiothoracic surgery if not already obtained. This  recommendation follows 2010 ACCF/AHA/AATS/ACR/ASA/SCA/SCAI/SIR/STS/SVM Guidelines for the Diagnosis and Management of Patients With Thoracic Aortic Disease. Circulation. 2010; [id number]. Aortic aneurysm NOS (ICD10-I71.9). 2.  No demonstrable pulmonary embolus. 3. Areas of mild scarring and atelectasis. No  edema or consolidation. 4.  No demonstrable thoracic adenopathy. Aortic aneurysm NOS (ICD10-I71.9). Aortic Atherosclerosis (ICD10-I70.0). Electronically Signed   By: Lowella Grip III M.D.   On: 09/13/2018 10:52   I have independently reviewed the above radiology studies  and reviewed the findings with the patient.  Ct Angio Chest Aorta W/cm &/or Wo/cm  Result Date: 09/01/2016 CLINICAL DATA:  Followup aneurysm. EXAM: CT ANGIOGRAPHY CHEST WITH CONTRAST TECHNIQUE: Multidetector CT imaging of the chest was performed using the standard protocol during bolus administration of intravenous contrast. Multiplanar CT image reconstructions and MIPs were obtained to evaluate the vascular anatomy. CONTRAST:  75 cc of Isovue 370 Creatinine was obtained on site at [facility] at [address] Results: Creatinine 1.1 mg/dL. COMPARISON:  08/20/2015 FINDINGS: Cardiovascular: Moderate cardiac enlargement. Aortic atherosclerosis. Measured at the same level as previously the ascending thoracic aorta measures 4.3 by 4.4 cm, image 65 of series 4. Unchanged from previous exam. The anterior arch measures 3.2 cm, image 41 of series 4. Also unchanged. The posterior arch measures 3.3 cm, image 41 of series 4. Stable from previous exam. The descending thoracic aorta is stable measuring 3.1 cm. Previous CABG procedure. Mediastinum/Nodes: No enlarged mediastinal, hilar, or axillary lymph nodes. Thyroid gland, trachea, and esophagus demonstrate no significant findings. Lungs/Pleura: No pleural effusion. Mild diffuse bronchial wall thickening noted. No airspace opacities identified. Upper Abdomen: No acute abnormality.  Musculoskeletal: Mild scoliosis and degenerative disc disease identified. Review of the MIP images confirms the above findings. IMPRESSION: 1. Stable thoracic Aortic aneurysm NOS (ICD10-I71.9) measuring 4.4 cm. Recommend annual imaging followup by CTA or MRA. This recommendation follows 2010 ACCF/AHA/AATS/ACR/ASA/SCA/SCAI/SIR/STS/SVM Guidelines for the Diagnosis and Management of Patients with Thoracic Aortic Disease. Circulation. 2010; 121: [id number] 2.  Aortic Atherosclerosis (ICD10-I70.0). Electronically Signed   By: Kerby Moors M.D.   On: 09/01/2016 11:11   I have independently reviewed the above radiology studies  and reviewed the findings with the patient.    Ct Angio Chest Aorta W/cm &/or Wo/cm  08/01/2013   CLINICAL DATA:  Thoracic aortic aneurysmal disease with history of aortic valve replacement.  EXAM: CT ANGIOGRAPHY CHEST WITH CONTRAST  TECHNIQUE: Multidetector CT imaging of the chest was performed using the standard protocol during bolus administration of intravenous contrast. Multiplanar CT image reconstructions and MIPs were obtained to evaluate the vascular anatomy.  CONTRAST:  51mL OMNIPAQUE IOHEXOL 350 MG/ML SOLN  COMPARISON:  02/16/2012 and other prior CTA studies.  FINDINGS: Measured maximal diameter of the ascending thoracic aorta is 4.3 cm. The 4.6 cm measurement previously may have been slightly overestimated. There is no evidence of aortic dissection or intramural hemorrhage. The proximal arch measures 3.3 cm. The distal arch measures 3.2 cm. The descending thoracic aorta measures 3.2 cm. Stable mild atherosclerotic plaque involving the arch and proximal great vessels without evidence of ulcerated plaque or penetrating ulcer. Proximal great vessels showed no significant stenosis.  The heart size appears stable. There is no evidence of pleural or pericardial fluid. Lungs show no evidence of edema, consolidation or nodule. No enlarged lymph nodes are identified. Stable calcified  plaque in the distribution of the LAD and left circumflex coronary arteries. Visualized upper abdomen is unremarkable. Bony structures are unremarkable.  Review of the MIP images confirms the above findings.  IMPRESSION: No further enlargement of the ascending thoracic aorta. Current maximal diameter measurement is 4.3 cm by CTA and appears to be stable compared to the 06/2010 study. Previous measurement of 4.6 cm may have been slightly  overestimated based on axis of measurement.   Electronically Signed   By: Aletta Edouard M.D.   On: 08/01/2013 13:36    Ct Angio Chest Aorta W/cm &/or Wo/cm  08/14/2012   *RADIOLOGY REPORT*  Clinical Data:  86-month follow-up thoracic aortic aneurysm and liver lesion.  CT ANGIOGRAPHY CHEST  Technique:  Multidetector CT imaging of the chest using the standard protocol during bolus administration of intravenous contrast. Multiplanar reconstructed images including MIPs were obtained and reviewed to evaluate the vascular anatomy.  Contrast: 66mL OMNIPAQUE IOHEXOL 350 MG/ML SOLN  Comparison: Prior CTA of the chest 02/16/2012  Findings:  Mediastinum: Unremarkable CT appearance of the thyroid gland.  No suspicious mediastinal or hilar adenopathy.  No soft tissue mediastinal mass.  The thoracic esophagus is unremarkable.  Heart/Vascular: Surgical changes of mechanical aortic valve repair. There is persistent dilatation of the aortic root up to 4.3 cm at the sinuses of Valsalva.  This is not significantly changed compared to prior.  The aorta measures approximately 4.2 cm at the sinotubular junction.  The ascending aorta is aneurysmally dilated with a maximal diameter of 4.6 cm, unchanged compared to prior. The transverse aorta measures 3.4 cm.  The proximal descending thoracic aorta measures 3.6 cm.  Scattered atherosclerotic vascular calcifications.  Extensive atherosclerotic calcifications noted throughout the coronary arteries. Mild ectasia of the right subclavian artery to 1.6 cm.  Stable borderline cardiomegaly.  Lungs/Pleura: Trace biapical pleural parenchymal scarring.  Trace dependent atelectasis.  The lungs are otherwise clear.  Upper Abdomen: Stable 6 - 7 mm hypoattenuating lesion in the posterior aspect of hepatic segments six.  No significant interval growth.  Sludge and/or small stones layer within the gallbladder lumen.  Bilateral renal cortical thinning with scattered areas of scarring.  Otherwise, unremarkable upper abdomen.  Bones: No acute fracture or aggressive appearing lytic or blastic osseous lesion.  T12 hemangioma.  Healed median sternotomy.  IMPRESSION:  1.  Stable ascending aortic aneurysm with maximal diameter of the ascending tubular segment at 4.6 cm and  maximal diameter of the sinotubular junction 4.3 cm.  2.  Stable sub centimeter hypoattenuating lesion in the posterior right hepatic lobe.  In retrospect, this finding was likely present in 2012 (although it cannot be definitively identified on studies from 2010 or 2009) and is very likely benign. Recommend continued attention on routine follow-up imaging.  3.  Sludge and / or small stones in the gallbladder lumen.  4.  Atherosclerosis including multivessel coronary artery disease.  Signed,  Criselda Peaches, MD Vascular & Interventional Radiologist [facility]   Original Report Authenticated By: Jacqulynn Cadet, M.D.     Ct Angio Chest W/cm &/or Wo Cm  02/16/2012  *RADIOLOGY REPORT*  Clinical Data: Thoracic aortic aneurysm  CT ANGIOGRAPHY CHEST  Technique:  Multidetector CT imaging of the chest using the standard protocol during bolus administration of intravenous contrast. Multiplanar reconstructed images including MIPs were obtained and reviewed to evaluate the vascular anatomy.  Contrast: 152mL OMNIPAQUE IOHEXOL 350 MG/ML SOLN  Comparison: 06/24/2010  Findings: Aneurysmal dilatation of the ascending aorta has slightly increased.  Diameters at the sinus of Valsalva, sinotubular junction, and  ascending aorta are 4.4 cm, 4.3 cm, and 4.6 cm. Previously, maximal diameter in the sinus of Valsalva was 4.3 cm while the ascending aorta was 4.2 cm.  Aortic valve replacement hardware remains stable in position.  Mitral valve calcifications are noted coronary artery calcifications in the left main, left anterior descending, and circumflex arteries are noted.  Mild atherosclerotic  changes at the origin of the left common carotid artery and left subclavian artery are present without significant narrowing.  Innominate artery and right subclavian artery are ectatic measuring 1.8 and 1.8 cm in caliber respectively.  Subclavian arteries are patent.  Visualized common carotid arteries are patent.  Vertebral arteries are patent.  Left vertebral is diminutive.  No obvious filling defect in the pulmonary arterial tree to suggest acute pulmonary thromboembolism.  No evidence of aortic transection or dissection.  Limited images of the abdomen demonstrate mild narrowing at the origin of the celiac axis with the appearance of median arcuate ligament syndrome.  SMA is patent.  Branch vessels of the celiac axis are grossly patent.  Atherosclerotic changes at the origin of both renal arteries is present.  Degree of narrowing is difficult to determine secondary to calcifications.  No evidence of abnormal mediastinal, hilar, or axillary adenopathy. No pericardial effusion.  No pneumothorax.  No pleural effusion.  Diffuse centrilobular emphysema is not significantly changed.  No new mass or consolidation.  Stable parenchymal scarring at the lung apices.  Bilateral renal scarring.  Adrenal glands are within normal limits. New sub centimeter low density lesion in the right lobe of the liver has developed on image 131.  No destructive bone lesion.  T12 angioma is noted.  IMPRESSION: Aneurysmal dilatation has slightly increased.  Maximal diameter of the ascending aorta is 4.6 cm and was previously 4.2 cm.  Maximal diameter at the sinus  of Valsalva is 4.4 cm and was previously 4.3 cm.  New sub centimeter liver lesion in the right lobe has developed. It is benign appearing but because it is new, follow-up CT or MRI of the liver in 6 months is recommended to ensure stability.  It may have been obscured on prior studies secondary to phase of liver enhancement.   Original Report Authenticated By: Marybelle Killings, M.D.       Recent Lab Findings: Lab Results  Component Value Date   WBC 4.8 02/26/2018   HGB 14.2 02/26/2018   HCT 40.7 02/26/2018   PLT 222.0 02/26/2018   GLUCOSE 102 (H) 02/26/2018   CHOL 135 02/26/2018   TRIG 73.0 02/26/2018   HDL 43.90 02/26/2018   LDLCALC 77 02/26/2018   ALT 13 02/26/2018   AST 18 02/26/2018   NA 139 02/26/2018   K 4.2 02/26/2018   CL 100 02/26/2018   CREATININE 0.97 02/26/2018   BUN 21 02/26/2018   CO2 32 02/26/2018   TSH 1.12 02/26/2018   INR 3.9 (A) 08/15/2018   Aortic Size Index=     4.6    /Body surface area is 1.78 meters squared. =  2.51  < 2.75 cm/m2      4% risk per year 2.75 to 4.25          8% risk per year > 4.25 cm/m2    20% risk per year  cross sectional area of aorta cm2/height in meters  16.6/1.77= 9.3   Assessment / Plan:    1 status post replacement of aortic valve with 29 mm mechanical St. Jude . In 1991  Aneurysmal dilatation of the ascending thoracic aorta Stable thoracic Aortic aneurysm-No operative intervention is recommended  He continues to be diligent about his dental care and is aware of endocarditis precautions  Plan follow-up CTA 1 year  Grace Isaac MD  Beeper [phone] Office [phone] 09/13/2018 12:34 PM

## 2018-09-19 ENCOUNTER — Ambulatory Visit (INDEPENDENT_AMBULATORY_CARE_PROVIDER_SITE_OTHER): Payer: Medicare Other

## 2018-09-19 ENCOUNTER — Other Ambulatory Visit: Payer: Self-pay

## 2018-09-19 DIAGNOSIS — Z952 Presence of prosthetic heart valve: Secondary | ICD-10-CM | POA: Diagnosis not present

## 2018-09-19 DIAGNOSIS — I359 Nonrheumatic aortic valve disorder, unspecified: Secondary | ICD-10-CM | POA: Diagnosis not present

## 2018-09-19 DIAGNOSIS — Z5181 Encounter for therapeutic drug level monitoring: Secondary | ICD-10-CM | POA: Diagnosis not present

## 2018-09-19 LAB — POCT INR: INR: 2.8 (ref 2.0–3.0)

## 2018-09-19 NOTE — Patient Instructions (Signed)
Description   Continue on same dosage 1.5 tablets every day except 2 tablets on Wednesdays and Saturdays.  Coumadin Clinic # [phone].Call with any concerns  Recheck INR in 6 weeks.

## 2018-10-15 NOTE — Progress Notes (Signed)
Cardiology Office Note:    Date:  10/16/2018   ID:  [name], DOB 1929/09/22, MRN [id number]  PCP:  [name], MD  Cardiologist:  Nelva Bush, MD >> Sherren Mocha, MD  Electrophysiologist:  None   Referring MD: [name], MD   Chief Complaint  Patient presents with  . Follow-up    hx of AVR; carotid dz    History of Present Illness:    David Benton is a 85 y.o. male with:  Bicuspid Aortic Valve Stenosis  S/p St Jude Mechanical AVR in 1991  No longer on ASA due to hx of spontaneous forearm hematoma   Thoracic Aortic Aneurysm   Followed by [name]  CT 08/2018: 4.6 x 4.5 cm  Carotid Artery Dz   Hypertension   Hyperlipidemia   Parkinson's Disease  Aortic atherosclerosis   Prostate CA  Meniere's Dz   David Benton was last seen by [name] in 09/2017.    He returns for follow-up.  He is here alone.  Since last seen, he has done well without chest discomfort, significant shortness of breath, syncope, orthopnea.  He does have some mild edema on the top of his left foot at times.    Prior CV studies:   The following studies were reviewed today:  Chest CTA 09/13/2018 IMPRESSION: 1. Ascending thoracic aortic diameter is stable, measuring 4.6 x 4.5 cm. No evident dissection. There are foci of aortic atherosclerosis as well as great vessel and coronary artery calcification. Patient is status post aortic valve replacement. Ascending thoracic aortic aneurysm. Recommend semi-annual imaging followup by CTA or MRA and referral to cardiothoracic surgery if not already obtained. This recommendation follows 2010 ACCF/AHA/AATS/ACR/ASA/SCA/SCAI/SIR/STS/SVM Guidelines for the Diagnosis and Management of Patients With Thoracic Aortic Disease. Circulation. 2010; [id number]. Aortic aneurysm NOS (ICD10-I71.9).  2.  No demonstrable pulmonary embolus.  3. Areas of mild scarring and atelectasis. No edema or consolidation.  4.  No demonstrable  thoracic adenopathy.  Aortic aneurysm NOS (ICD10-I71.9). Aortic Atherosclerosis (ICD10-I70.0).   Carotid US 04/11/17 Bilat ICA 1-39 Dilated R subclavian artery (1.7 x 1.8 cm)  Echocardiogram 02/09/2016 Mild LVH, severe focal basal septal hypertrophy, EF 60-65, no RWMA, Gr 1 DD, mechanical AVR w/ mean gradient 7, ascending aorta 45 mm (mildly dilated), severe LAE, aneurysmal IAS, mild TR, PASP 37    Past Medical History:  Diagnosis Date  . AVD (aortic valve disease) 02/25/2010   sees [name]   . External hemorrhoids without mention of complication   . Heart valve replaced by other means   . Long term (current) use of anticoagulants 09/15/2010  . Meniere's disease   . Other and unspecified hyperlipidemia   . Other constipation   . Parkinson disease ([place]) 02/2016   no meds, sees [name]   . Personal history of colonic polyps   . Personal history of malignant neoplasm of prostate   . Right inguinal hernia 11/18/2016  . Shingles   . SHINGLES 11/26/2009  . Thoracic aneurysm without mention of rupture    followed by [name] with yearly CT scans   . Unspecified essential hypertension    Surgical Hx: The patient  has a past surgical history that includes Aortic valve replacement (1991); Hemorrhoid surgery; Tonsillectomy; Colonoscopy (06-28-10); Prostatectomy (1995); Inguinal hernia repair (Right, 11/18/2016); and Insertion of mesh (Right, 11/18/2016).   Current Medications: Current Meds  Medication Sig  . acetaminophen (TYLENOL) 325 MG tablet Take 650 mg by mouth every 6 (six)  hours as needed for pain.  Marland Kitchen amoxicillin (AMOXIL) 500 MG capsule Take 4 tablets (2,000mg ) 1 hour prior to dental visits.  Marland Kitchen atorvastatin (LIPITOR) 40 MG tablet TAKE 1 TABLET BY MOUTH EVERY DAY  . levothyroxine (SYNTHROID) 75 MCG tablet TAKE 1 TABLET BY MOUTH EVERY DAY  . magnesium hydroxide (MILK OF MAGNESIA) 400 MG/5ML suspension Take 30 mLs by mouth daily as needed for indigestion. Reported on  04/21/2015  . Probiotic Product (PROBIOTIC-10 ULTIMATE) CAPS Take 1 capsule by mouth daily.  . sodium chloride (OCEAN) 0.65 % SOLN nasal spray Place 1 spray into both nostrils as needed for congestion. Reported on 04/21/2015  . triamterene-hydrochlorothiazide (MAXZIDE-25) 37.5-25 MG tablet TAKE 1 TABLET BY MOUTH EVERY MORNING  . warfarin (COUMADIN) 5 MG tablet TAKE AS DIRECTED BY COUMADIN CLINIC FOR 30 DAYS     Allergies:   Codeine   Social History   Tobacco Use  . Smoking status: Former Smoker    Packs/day: 0.50    Years: 10.00    Pack years: 5.00    Types: Cigarettes    Quit date: 02/06/1972    Years since quitting: 46.7  . Smokeless tobacco: Never Used  Substance Use Topics  . Alcohol use: Yes    Alcohol/week: 7.0 standard drinks    Types: 7 Standard drinks or equivalent per week    Comment: glass of wine daily; just about   . Drug use: No     Family Hx: The patient's family history includes Breast cancer in his sister; CAD in his father; Diabetes in his brother and brother; Heart attack in his mother; Heart attack (age of onset: 66) in his father; Heart disease in his brother; Hypertension in his brother and father; Other in his father; Prostate cancer in his brother; Stroke in his brother. There is no history of Colon cancer.  ROS:   Please see the history of present illness.    Review of Systems  Gastrointestinal: Negative for hematochezia.  Genitourinary: Negative for hematuria.   All other systems reviewed and are negative.   EKGs/Labs/Other Test Reviewed:    EKG:  EKG is   ordered today.  The ekg ordered today demonstrates normal sinus rhythm, heart rate 71, left axis deviation, nonspecific ST-T wave changes, first-degree AV block, PR 222 ms, QTC 460, no change since prior tracing dated 10/16/2017  Recent Labs: 02/26/2018: ALT 13; BUN 21; Creatinine, Ser 0.97; Hemoglobin 14.2; Platelets 222.0; Potassium 4.2; Sodium 139; TSH 1.12   Recent Lipid Panel Lab Results   Component Value Date/Time   CHOL 135 02/26/2018 08:51 AM   CHOL 136 07/08/2016 02:49 PM   TRIG 73.0 02/26/2018 08:51 AM   HDL 43.90 02/26/2018 08:51 AM   HDL 49 07/08/2016 02:49 PM   CHOLHDL 3 02/26/2018 08:51 AM   LDLCALC 77 02/26/2018 08:51 AM   LDLCALC 63 07/08/2016 02:49 PM    Physical Exam:    VS:  BP 138/78   Pulse 71   Ht 5\' 10"  (1.778 m)   Wt 145 lb 12.8 oz (66.1 kg)   SpO2 97%   BMI 20.92 kg/m     Wt Readings from Last 3 Encounters:  10/16/18 145 lb 12.8 oz (66.1 kg)  09/13/18 142 lb (64.4 kg)  07/13/18 145 lb (65.8 kg)     Physical Exam  Constitutional: He is oriented to person, place, and time. He appears well-developed and well-nourished. No distress.  HENT:  Head: Normocephalic and atraumatic.  Eyes: No scleral icterus.  Neck: No  JVD present. No thyromegaly present.  Cardiovascular: Normal rate and regular rhythm.  Murmur heard.  Systolic murmur is present with a grade of 2/6 at the upper right sternal border and lower left sternal border. Pulmonary/Chest: Effort normal and breath sounds normal. He has no rales.  Abdominal: Soft. There is no hepatomegaly.  Musculoskeletal:        General: No edema.  Lymphadenopathy:    He has no cervical adenopathy.  Neurological: He is alert and oriented to person, place, and time.  Skin: Skin is warm and dry.  Psychiatric: He has a normal mood and affect.    ASSESSMENT & PLAN:    1. Aortic valve disease 2. S/P aortic valve replacement History of mechanical aortic valve replacement in 1991.  He remains on Coumadin.  Aspirin was stopped in the past secondary to spontaneous forearm hematoma.  His Coumadin is managed in our office and his most recent INR was 2.8 on 09/19/2018.  His last echocardiogram in December 2018 demonstrated stable gradients across his mechanical aortic valve.  Continue SBE prophylaxis.  3. Thoracic aortic aneurysm without rupture ([place]) Most recent measurement on CT scan was 4.6 cm.  He continues  to follow-up with [name].  4. Aortic atherosclerosis (H[facility]) Continue statin therapy.  5. Bilateral carotid artery stenosis Carotid US in March 2019 demonstrated bilateral ICA stenosis of 1-39%.  There was a dilated right subclavian artery.  Arrange follow-up carotid US.  6. Essential hypertension The patient's blood pressure is controlled on his current regimen.  Continue current therapy.   7. Hyperlipidemia LDL goal <70 LDL optimal on most recent lab work.  Continue current Rx.     Dispo:  Return in about 1 year (around 10/16/2019) for Routine Follow Up, w/ Dr. Burt Knack, or Richardson Dopp, PA-C, (virtual or in-person).   Medication Adjustments/Labs and Tests Ordered: Current medicines are reviewed at length with the patient today.  Concerns regarding medicines are outlined above.  Tests Ordered: Orders Placed This Encounter  Procedures  . EKG 12-Lead  . VAS US CAROTID   Medication Changes: No orders of the defined types were placed in this encounter.   Signed, Richardson Dopp, PA-C  10/16/2018 11:32 AM    [facility] [address], [place], [place]  [address] Phone: [phone]; Fax: [phone]

## 2018-10-16 ENCOUNTER — Ambulatory Visit (INDEPENDENT_AMBULATORY_CARE_PROVIDER_SITE_OTHER): Payer: Medicare Other | Admitting: Physician Assistant

## 2018-10-16 ENCOUNTER — Encounter: Payer: Self-pay | Admitting: Physician Assistant

## 2018-10-16 ENCOUNTER — Other Ambulatory Visit: Payer: Self-pay

## 2018-10-16 ENCOUNTER — Ambulatory Visit (INDEPENDENT_AMBULATORY_CARE_PROVIDER_SITE_OTHER): Payer: Medicare Other

## 2018-10-16 VITALS — BP 138/78 | HR 71 | Ht 70.0 in | Wt 145.8 lb

## 2018-10-16 DIAGNOSIS — I700 Atherosclerosis of aorta: Secondary | ICD-10-CM | POA: Diagnosis not present

## 2018-10-16 DIAGNOSIS — Z952 Presence of prosthetic heart valve: Secondary | ICD-10-CM

## 2018-10-16 DIAGNOSIS — I10 Essential (primary) hypertension: Secondary | ICD-10-CM

## 2018-10-16 DIAGNOSIS — E785 Hyperlipidemia, unspecified: Secondary | ICD-10-CM

## 2018-10-16 DIAGNOSIS — I712 Thoracic aortic aneurysm, without rupture, unspecified: Secondary | ICD-10-CM

## 2018-10-16 DIAGNOSIS — I359 Nonrheumatic aortic valve disorder, unspecified: Secondary | ICD-10-CM | POA: Diagnosis not present

## 2018-10-16 DIAGNOSIS — I6523 Occlusion and stenosis of bilateral carotid arteries: Secondary | ICD-10-CM | POA: Diagnosis not present

## 2018-10-16 DIAGNOSIS — Z23 Encounter for immunization: Secondary | ICD-10-CM | POA: Diagnosis not present

## 2018-10-16 NOTE — Patient Instructions (Addendum)
Medication Instructions:  .Your physician recommends that you continue on your current medications as directed. Please refer to the Current Medication list given to you today.  If you need a refill on your cardiac medications before your next appointment, please call your pharmacy.   Lab work: NONE ORDERED  TODAY   If you have labs (blood work) drawn today and your tests are completely normal, you will receive your results only by: Marland Kitchen MyChart Message (if you have MyChart) OR . A paper copy in the mail If you have any lab test that is abnormal or we need to change your treatment, we will call you to review the results.  Testing/Procedures: Your physician has requested that you have a carotid duplex. This test is an ultrasound of the carotid arteries in your neck. It looks at blood flow through these arteries that supply the brain with blood. Allow one hour for this exam. There are no restrictions or special instructions.  Follow-Up: At [facility], you and your health needs are our priority.  As part of our continuing mission to provide you with exceptional heart care, we have created designated Provider Care Teams.  These Care Teams include your primary Cardiologist (physician) and Advanced Practice Providers (APPs -  Physician Assistants and Nurse Practitioners) who all work together to provide you with the care you need, when you need it. You will need a follow up appointment in:  1 years.  Please call our office 2 months in advance to schedule this appointment.  You may see Sherren Mocha, MD or one of the following Advanced Practice Providers on your designated Care Team: Richardson Dopp, PA-C [place], [place] . Daune Perch, NP  Any Other Special Instructions Will Be Listed Below (If Applicable).

## 2018-10-22 DIAGNOSIS — H5203 Hypermetropia, bilateral: Secondary | ICD-10-CM | POA: Diagnosis not present

## 2018-10-22 DIAGNOSIS — D3131 Benign neoplasm of right choroid: Secondary | ICD-10-CM | POA: Diagnosis not present

## 2018-10-22 DIAGNOSIS — H25813 Combined forms of age-related cataract, bilateral: Secondary | ICD-10-CM | POA: Diagnosis not present

## 2018-10-22 DIAGNOSIS — H52203 Unspecified astigmatism, bilateral: Secondary | ICD-10-CM | POA: Diagnosis not present

## 2018-10-23 ENCOUNTER — Other Ambulatory Visit: Payer: Self-pay | Admitting: Physician Assistant

## 2018-10-23 DIAGNOSIS — Z952 Presence of prosthetic heart valve: Secondary | ICD-10-CM

## 2018-10-23 DIAGNOSIS — I6523 Occlusion and stenosis of bilateral carotid arteries: Secondary | ICD-10-CM

## 2018-10-29 ENCOUNTER — Ambulatory Visit (HOSPITAL_COMMUNITY)
Admission: RE | Admit: 2018-10-29 | Discharge: 2018-10-29 | Disposition: A | Discharge status: Home / Self Care | Payer: Medicare Other | Source: Ambulatory Visit | Attending: Cardiology | Admitting: Cardiology

## 2018-10-29 ENCOUNTER — Other Ambulatory Visit: Payer: Self-pay

## 2018-10-29 ENCOUNTER — Encounter: Payer: Self-pay | Admitting: Physician Assistant

## 2018-10-29 DIAGNOSIS — I6523 Occlusion and stenosis of bilateral carotid arteries: Secondary | ICD-10-CM | POA: Insufficient documentation

## 2018-10-29 DIAGNOSIS — Z952 Presence of prosthetic heart valve: Secondary | ICD-10-CM | POA: Insufficient documentation

## 2018-10-30 ENCOUNTER — Telehealth: Payer: Self-pay | Admitting: *Deleted

## 2018-10-30 DIAGNOSIS — I6523 Occlusion and stenosis of bilateral carotid arteries: Secondary | ICD-10-CM

## 2018-10-30 NOTE — Telephone Encounter (Signed)
-----   Message from Nuala Alpha, LPN sent at [id number]  4:53 PM EDT -----  ----- Message ----- From: Liliane Shi, PA-C Sent: 10/29/2018   4:47 PM EDT To: [facility] Triage  The Carotid ultrasound shows stable, mild bilateral internal carotid artery plaque.  The right subclavian artery is dilated but the size has not changed since March 2019. PLAN:   -Continue current medications and follow up as planned.   -Repeat carotid US in 1 year  -Please send a copy to he PCP. Richardson Dopp, PA-C    10/29/2018 4:44 PM

## 2018-10-30 NOTE — Telephone Encounter (Signed)
Pt has been notified of carotid results by phone with verbal understanding. Pt is agreeable to repeat carotid u/s in 1 yr. I will forward to PCP Dr. Alysia Penna. I have also release results to [place]. Pt thanked me for the call. Patient notified of result.  Please refer to phone note from today for complete details.   Julaine Hua, [place] 10/30/2018 8:55 AM

## 2018-10-31 ENCOUNTER — Ambulatory Visit (INDEPENDENT_AMBULATORY_CARE_PROVIDER_SITE_OTHER): Payer: Medicare Other | Admitting: *Deleted

## 2018-10-31 ENCOUNTER — Other Ambulatory Visit: Payer: Self-pay

## 2018-10-31 DIAGNOSIS — Z5181 Encounter for therapeutic drug level monitoring: Secondary | ICD-10-CM

## 2018-10-31 LAB — POCT INR: INR: 3.5 — AB (ref 2.0–3.0)

## 2018-10-31 NOTE — Patient Instructions (Signed)
Description   Continue on same dosage 1.5 tablets every day except 2 tablets on Wednesdays and Saturdays.  Coumadin Clinic # [phone].Call with any concerns  Recheck INR in 6 weeks.

## 2018-11-10 ENCOUNTER — Encounter (INDEPENDENT_AMBULATORY_CARE_PROVIDER_SITE_OTHER): Payer: Self-pay

## 2018-11-13 ENCOUNTER — Other Ambulatory Visit: Payer: Self-pay | Admitting: Family Medicine

## 2018-11-13 ENCOUNTER — Other Ambulatory Visit: Payer: Self-pay | Admitting: Cardiovascular Disease

## 2018-11-13 NOTE — Telephone Encounter (Signed)
Please review for refill. Thank you! 

## 2018-11-28 DIAGNOSIS — H11003 Unspecified pterygium of eye, bilateral: Secondary | ICD-10-CM | POA: Diagnosis not present

## 2018-11-28 DIAGNOSIS — H25042 Posterior subcapsular polar age-related cataract, left eye: Secondary | ICD-10-CM | POA: Diagnosis not present

## 2018-11-28 DIAGNOSIS — H2513 Age-related nuclear cataract, bilateral: Secondary | ICD-10-CM | POA: Diagnosis not present

## 2018-11-28 DIAGNOSIS — H524 Presbyopia: Secondary | ICD-10-CM | POA: Diagnosis not present

## 2018-12-04 ENCOUNTER — Telehealth: Payer: Self-pay | Admitting: Pharmacist

## 2018-12-04 NOTE — Telephone Encounter (Signed)
Patient called to say he is going to have cataract surgery. Advised that he does NOT need to hold warfarin for this procedure.

## 2018-12-12 ENCOUNTER — Ambulatory Visit (INDEPENDENT_AMBULATORY_CARE_PROVIDER_SITE_OTHER): Payer: Medicare Other | Admitting: *Deleted

## 2018-12-12 ENCOUNTER — Other Ambulatory Visit: Payer: Self-pay

## 2018-12-12 DIAGNOSIS — Z5181 Encounter for therapeutic drug level monitoring: Secondary | ICD-10-CM | POA: Diagnosis not present

## 2018-12-12 LAB — POCT INR: INR: 3.5 — AB (ref 2.0–3.0)

## 2018-12-12 NOTE — Patient Instructions (Signed)
Description   Continue on same dosage 1.5 tablets every day except 2 tablets on Wednesdays and Saturdays.  Coumadin Clinic # [phone].Call with any concerns  Recheck INR in 6 weeks.

## 2018-12-18 HISTORY — PX: CATARACT EXTRACTION: SUR2

## 2018-12-20 DIAGNOSIS — H52222 Regular astigmatism, left eye: Secondary | ICD-10-CM | POA: Diagnosis not present

## 2018-12-20 DIAGNOSIS — H2512 Age-related nuclear cataract, left eye: Secondary | ICD-10-CM | POA: Diagnosis not present

## 2018-12-20 DIAGNOSIS — H25042 Posterior subcapsular polar age-related cataract, left eye: Secondary | ICD-10-CM | POA: Diagnosis not present

## 2018-12-20 DIAGNOSIS — H25812 Combined forms of age-related cataract, left eye: Secondary | ICD-10-CM | POA: Diagnosis not present

## 2019-01-15 ENCOUNTER — Other Ambulatory Visit: Payer: Self-pay | Admitting: Internal Medicine

## 2019-01-15 NOTE — Telephone Encounter (Signed)
Refill Request.  

## 2019-01-21 NOTE — Progress Notes (Signed)
David Benton was seen today in follow up for Parkinsons disease.  My previous records were reviewed prior to todays visit.   Outside records that were made available to me were reviewed.  Pt denies falls.  Feels that he is doing about the same over the last 6 months.  Walking for exercise and doing RSB online on Saturday.   Pt denies lightheadedness, near syncope.  No hallucinations.  Mood has been good.  Saw Cardiology PA on 9/29 and ordered f/u carotid u/s which was completed and reviewed independently by me.  This was completed on October 12.  There was 1 to 39% stenosis on the right and 1 to 39% stenosis on the left.  He also recently had L cataract sx and is getting ready to have the R one done.    Current prescribed movement disorder medications: none    ALLERGIES:   Allergies  Allergen Reactions  . Codeine     REACTION: N/V    CURRENT MEDICATIONS:  Outpatient Encounter Medications as of 01/22/2019  Medication Sig  . acetaminophen (TYLENOL) 325 MG tablet Take 650 mg by mouth every 6 (six) hours as needed for pain.  Marland Kitchen amoxicillin (AMOXIL) 500 MG capsule Take 4 tablets (2,000mg ) 1 hour prior to dental visits.  Marland Kitchen atorvastatin (LIPITOR) 40 MG tablet TAKE 1 TABLET BY MOUTH EVERY DAY  . levothyroxine (SYNTHROID) 75 MCG tablet TAKE 1 TABLET BY MOUTH EVERY DAY  . magnesium hydroxide (MILK OF MAGNESIA) 400 MG/5ML suspension Take 30 mLs by mouth daily as needed for indigestion. Reported on 04/21/2015  . Probiotic Product (PROBIOTIC-10 ULTIMATE) CAPS Take 1 capsule by mouth daily.  . sodium chloride (OCEAN) 0.65 % SOLN nasal spray Place 1 spray into both nostrils as needed for congestion. Reported on 04/21/2015  . triamterene-hydrochlorothiazide (MAXZIDE-25) 37.5-25 MG tablet TAKE 1 TABLET BY MOUTH EVERY MORNING  . warfarin (COUMADIN) 5 MG tablet TAKE AS DIRECTED BY COUMADIN CLINIC FOR 30 DAYS   No facility-administered encounter medications on file as of 01/22/2019.    PHYSICAL EXAMINATION:     VITALS:   Vitals:   01/22/19 1429  BP: (!) 158/76  Pulse: 91  SpO2: 96%  Weight: 144 lb 4 oz (65.4 kg)  Height: 5\' 10"  (1.778 m)    GEN:  The patient appears stated age and is in NAD. HEENT:  Normocephalic, atraumatic.  The mucous membranes are moist. The superficial temporal arteries are without ropiness or tenderness. CV:  RRR Lungs:  CTAB Neck/HEME:  There are no carotid bruits bilaterally.  Neurological examination:  Orientation: The patient is alert and oriented x3. Cranial nerves: There is good facial symmetry with mild facial hypomimia. The speech is fluent and clear. Soft palate rises symmetrically and there is no tongue deviation. Hearing is intact to conversational tone. Sensation: Sensation is intact to light touch throughout Motor: Strength is at least antigravity x4.  Movement examination: Tone: There is mild to moderate increased tone in the left upper extremity and mild increased tone in the right upper extremity. Abnormal movements: there is RUE rest tremor>L (rare on the L). Gait and Station: The patient has no difficulty arising out of a deep-seated chair without the use of the hands. The patient's stride length is good.       ASSESSMENT/PLAN:  1.  Parkinson's disease  -Talked with the patient today and strongly encouraged him to consider going on levodopa.  It is my recommendation that he do so.  I told him that  while he walks well, he is rigid, which would increase his risk for falls.  My worry is that he would fall and suffer a fracture and end up in a subacute nursing facility during Covid.  This is certainly not what he would want.  He ultimately declined medication for now and stated that he would think this over for the next 6 months.  -Did congratulate the patient on all the exercise he is doing.  He will continue this.  2.  Status post aortic valve replacement  -Patient is on Coumadin.  Total time spent on today's visit was greater than 30  minutes, including both face-to-face time and nonface-to-face time.  Time included that spent on review of records (prior notes available to me/labs/imaging if pertinent), discussing treatment and goals, answering patient's questions and coordinating care.  Cc:  [name], MD

## 2019-01-22 ENCOUNTER — Ambulatory Visit: Payer: Medicare Other | Admitting: Neurology

## 2019-01-22 ENCOUNTER — Other Ambulatory Visit: Payer: Self-pay

## 2019-01-22 ENCOUNTER — Encounter: Payer: Self-pay | Admitting: Neurology

## 2019-01-22 VITALS — BP 158/76 | HR 91 | Ht 70.0 in | Wt 144.2 lb

## 2019-01-22 DIAGNOSIS — G20 Parkinson's disease: Secondary | ICD-10-CM

## 2019-01-22 NOTE — Patient Instructions (Addendum)
I very much want you to think about taking medication for the Parkinsons disease.  I don't want you to fall and break your hip and end up in a nursing home.  I do think that you look really well and am proud of you for all of the exercise!  Don't forget to make a dermatology appointment!  The physicians and staff at [facility] are committed to providing excellent care. You may receive a survey requesting feedback about your experience at our office. We strive to receive "very good" responses to the survey questions. If you feel that your experience would prevent you from giving the office a "very good " response, please contact our office to try to remedy the situation. We may be reached at [phone]. Thank you for taking the time out of your busy day to complete the survey.

## 2019-01-23 ENCOUNTER — Ambulatory Visit (INDEPENDENT_AMBULATORY_CARE_PROVIDER_SITE_OTHER): Payer: Medicare Other | Admitting: *Deleted

## 2019-01-23 DIAGNOSIS — Z5181 Encounter for therapeutic drug level monitoring: Secondary | ICD-10-CM | POA: Diagnosis not present

## 2019-01-23 LAB — POCT INR: INR: 3.6 — AB (ref 2.0–3.0)

## 2019-01-23 NOTE — Patient Instructions (Addendum)
Description   Today take 1.5 tablets then continue on same dosage 1.5 tablets every day except 2 tablets on Wednesdays and Saturdays. Coumadin Clinic # [phone]. Call with any concerns  Recheck INR in 6 weeks.

## 2019-02-11 ENCOUNTER — Other Ambulatory Visit: Payer: Self-pay | Admitting: Family Medicine

## 2019-02-28 DIAGNOSIS — H25811 Combined forms of age-related cataract, right eye: Secondary | ICD-10-CM | POA: Diagnosis not present

## 2019-02-28 DIAGNOSIS — H2511 Age-related nuclear cataract, right eye: Secondary | ICD-10-CM | POA: Diagnosis not present

## 2019-02-28 DIAGNOSIS — H52221 Regular astigmatism, right eye: Secondary | ICD-10-CM | POA: Diagnosis not present

## 2019-02-28 DIAGNOSIS — H25011 Cortical age-related cataract, right eye: Secondary | ICD-10-CM | POA: Diagnosis not present

## 2019-03-06 ENCOUNTER — Ambulatory Visit (INDEPENDENT_AMBULATORY_CARE_PROVIDER_SITE_OTHER): Payer: Medicare Other | Admitting: *Deleted

## 2019-03-06 ENCOUNTER — Other Ambulatory Visit: Payer: Self-pay

## 2019-03-06 DIAGNOSIS — Z5181 Encounter for therapeutic drug level monitoring: Secondary | ICD-10-CM

## 2019-03-06 LAB — POCT INR: INR: 3.5 — AB (ref 2.0–3.0)

## 2019-03-06 NOTE — Patient Instructions (Signed)
Description   Continue on same dosage 1.5 tablets every day except 2 tablets on Wednesdays and Saturdays. Coumadin Clinic # [phone]. Call with any concerns  Recheck INR in 6 weeks.

## 2019-03-18 ENCOUNTER — Other Ambulatory Visit: Payer: Self-pay

## 2019-03-18 NOTE — Patient Outreach (Signed)
[facility]) Care Management  03/18/2019  David Benton November 23, 1929 [id number]   Medication Adherence call to [name] HIPPA Compliant Voice message left with a call back number. David Benton is showing past due on Atorvastatin 40 mg under [place].   [facility] Direct Dial [phone]  Fax [phone] [email or web address]

## 2019-03-22 IMAGING — CT CT ANGIO CHEST
2 of 5 series · 9 of 30 positions shown · IV contrast (75CC ISOVUE 370)
Comparison: 08/20/2015

CLINICAL DATA: Followup aneurysm.

EXAM:
CT ANGIOGRAPHY CHEST WITH CONTRAST
TECHNIQUE: Multidetector CT imaging of the chest was performed using the
standard protocol during bolus administration of intravenous
contrast. Multiplanar CT image reconstructions and MIPs were
obtained to evaluate the vascular anatomy.
CONTRAST:  75 cc of Isovue 370
Creatinine was obtained on site at [HOSPITAL] at [REDACTED].
Results: Creatinine 1.1 mg/dL.

[Series 4: angio · axial · 0.70mm/px · z∈[-245,-50]mm · 4 of 131 slices shown]
[im 27/131  lung]
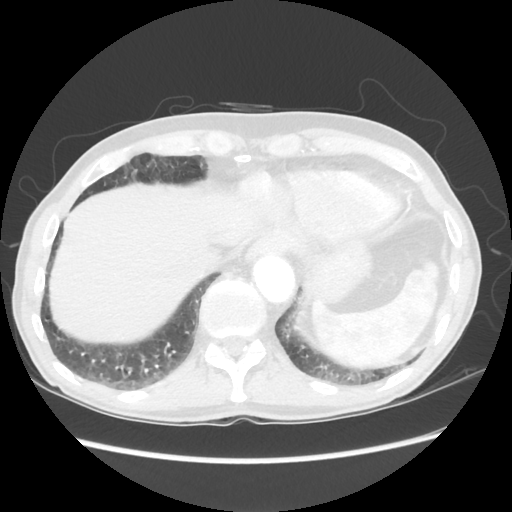
[im 53/131  mediastinal]
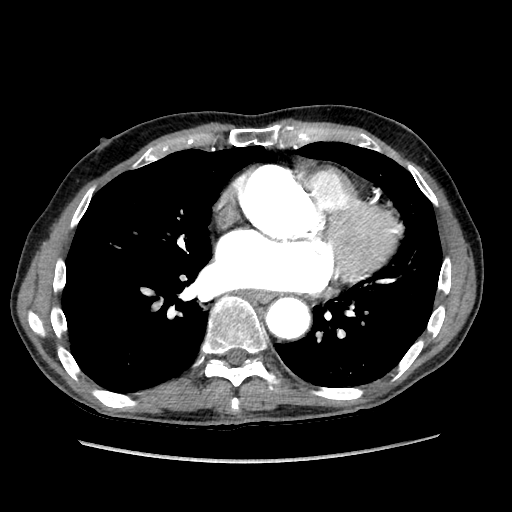
[im 79/131  lung]
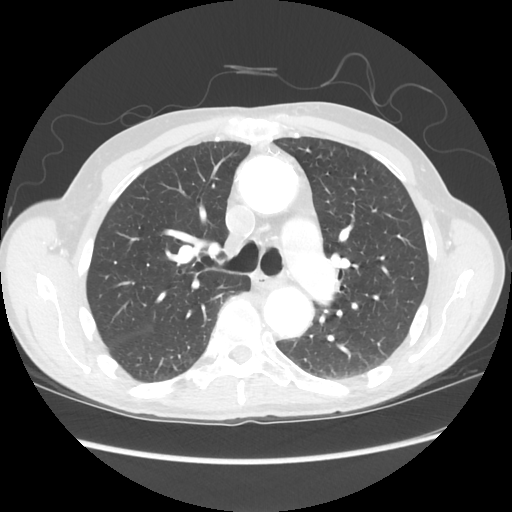
[im 105/131  mediastinal]
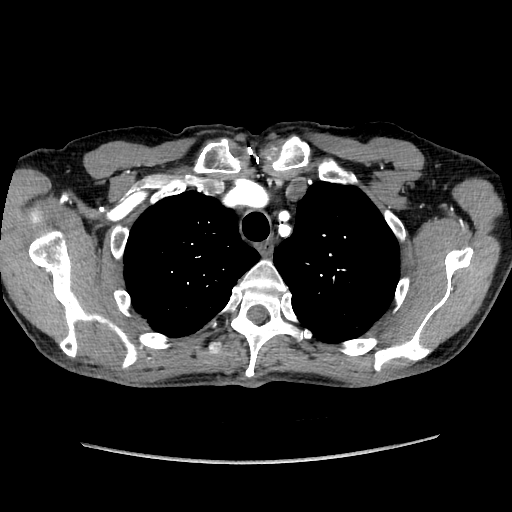

[Series 602: sagittal body · sagittal · 0.70mm/px · 5 of 145 slices shown]
[im 25/145  lung]
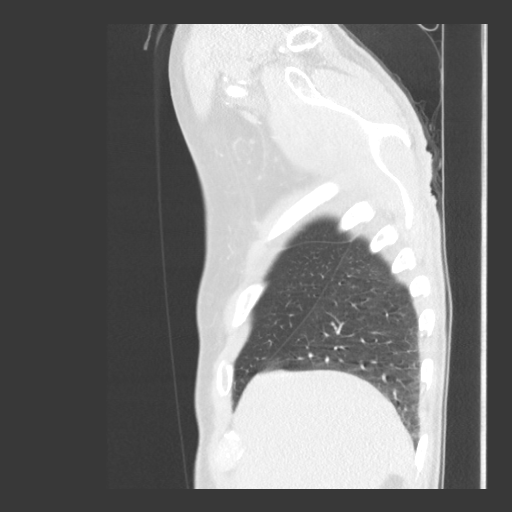
[im 49/145  lung]
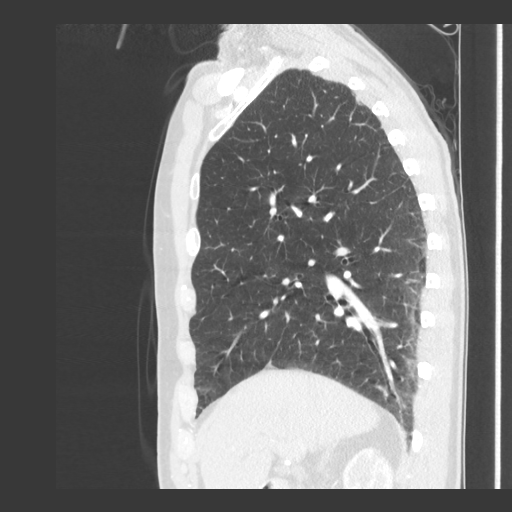
[im 73/145  lung]
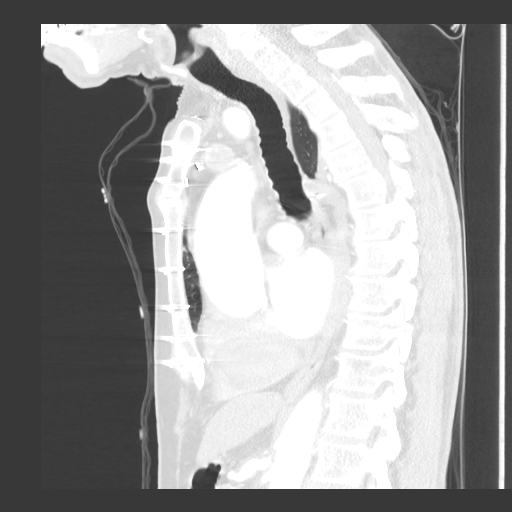
[im 97/145  lung]
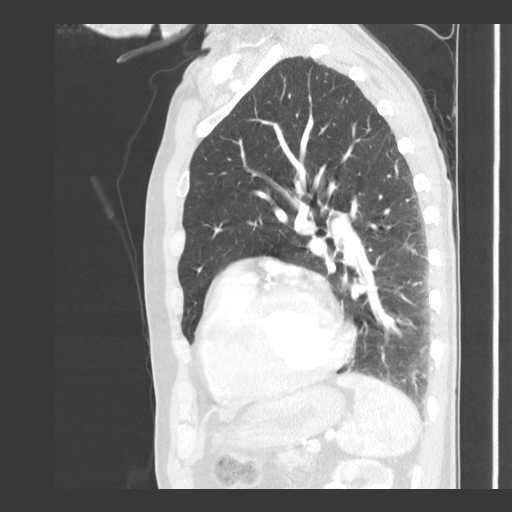
[im 121/145  lung]
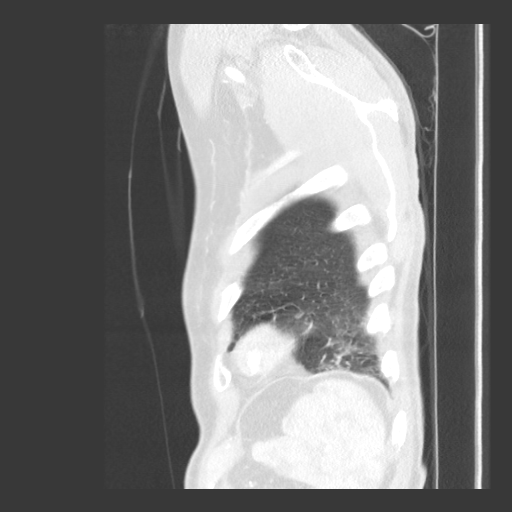

[9 of 30 positions shown; findings below may reference images not displayed]

FINDINGS: Cardiovascular: Moderate cardiac enlargement. Aortic
atherosclerosis. Measured at the same level as previously the
ascending thoracic aorta measures 4.3 by 4.4 cm, image 65 of series
4. Unchanged from previous exam. The anterior arch measures 3.2 cm,
image 41 of series 4. Also unchanged. The posterior arch measures
3.3 cm, image 41 of series 4. Stable from previous exam. The
descending thoracic aorta is stable measuring 3.1 cm. Previous CABG
procedure.

Mediastinum/Nodes: No enlarged mediastinal, hilar, or axillary lymph
nodes. Thyroid gland, trachea, and esophagus demonstrate no
significant findings.

Lungs/Pleura: No pleural effusion. Mild diffuse bronchial wall
thickening noted. No airspace opacities identified.

Upper Abdomen: No acute abnormality.

Musculoskeletal: Mild scoliosis and degenerative disc disease
identified.

Review of the MIP images confirms the above findings.
IMPRESSION: 1. Stable thoracic Aortic aneurysm NOS (P0SV5-C15.K) measuring
cm. Recommend annual imaging followup by CTA or MRA. This
recommendation follows 7424
ACCF/AHA/AATS/ACR/ASA/SCA/PAPAGENO/BOOS/ALGIMIRO/EDU Guidelines for the
Diagnosis and Management of Patients with Thoracic Aortic Disease.
Circulation. 7424; 121: [id number]
2.  Aortic Atherosclerosis (P0SV5-L81.1).

## 2019-04-08 DIAGNOSIS — Z961 Presence of intraocular lens: Secondary | ICD-10-CM | POA: Diagnosis not present

## 2019-04-15 ENCOUNTER — Other Ambulatory Visit: Payer: Self-pay | Admitting: Internal Medicine

## 2019-04-15 NOTE — Telephone Encounter (Signed)
Refill Request.  

## 2019-04-17 ENCOUNTER — Other Ambulatory Visit: Payer: Self-pay

## 2019-04-17 ENCOUNTER — Ambulatory Visit (INDEPENDENT_AMBULATORY_CARE_PROVIDER_SITE_OTHER): Payer: Medicare Other | Admitting: Pharmacist

## 2019-04-17 DIAGNOSIS — Z5181 Encounter for therapeutic drug level monitoring: Secondary | ICD-10-CM | POA: Diagnosis not present

## 2019-04-17 LAB — POCT INR: INR: 3.5 — AB (ref 2.0–3.0)

## 2019-04-17 NOTE — Patient Instructions (Signed)
Continue on same dosage 1.5 tablets every day except 2 tablets on Wednesdays and Saturdays. Coumadin Clinic # [phone]. Call with any concerns  Recheck INR in 6 weeks.

## 2019-04-24 ENCOUNTER — Other Ambulatory Visit: Payer: Self-pay

## 2019-04-25 ENCOUNTER — Encounter: Payer: Self-pay | Admitting: Family Medicine

## 2019-04-25 ENCOUNTER — Ambulatory Visit (INDEPENDENT_AMBULATORY_CARE_PROVIDER_SITE_OTHER): Payer: Medicare Other | Admitting: Family Medicine

## 2019-04-25 VITALS — BP 138/74 | HR 58 | Temp 97.7°F | Ht 70.0 in | Wt 141.8 lb

## 2019-04-25 DIAGNOSIS — E785 Hyperlipidemia, unspecified: Secondary | ICD-10-CM | POA: Diagnosis not present

## 2019-04-25 DIAGNOSIS — Z952 Presence of prosthetic heart valve: Secondary | ICD-10-CM

## 2019-04-25 DIAGNOSIS — G20 Parkinson's disease: Secondary | ICD-10-CM

## 2019-04-25 DIAGNOSIS — K4090 Unilateral inguinal hernia, without obstruction or gangrene, not specified as recurrent: Secondary | ICD-10-CM | POA: Diagnosis not present

## 2019-04-25 DIAGNOSIS — N138 Other obstructive and reflux uropathy: Secondary | ICD-10-CM | POA: Diagnosis not present

## 2019-04-25 DIAGNOSIS — N401 Enlarged prostate with lower urinary tract symptoms: Secondary | ICD-10-CM

## 2019-04-25 DIAGNOSIS — I10 Essential (primary) hypertension: Secondary | ICD-10-CM

## 2019-04-25 DIAGNOSIS — E039 Hypothyroidism, unspecified: Secondary | ICD-10-CM | POA: Diagnosis not present

## 2019-04-25 LAB — HEPATIC FUNCTION PANEL
ALT: 24 U/L (ref 0–53)
AST: 26 U/L (ref 0–37)
Albumin: 4.5 g/dL (ref 3.5–5.2)
Alkaline Phosphatase: 89 U/L (ref 39–117)
Bilirubin, Direct: 0.3 mg/dL (ref 0.0–0.3)
Total Bilirubin: 1.9 mg/dL — ABNORMAL HIGH (ref 0.2–1.2)
Total Protein: 6.6 g/dL (ref 6.0–8.3)

## 2019-04-25 LAB — CBC WITH DIFFERENTIAL/PLATELET
Basophils Absolute: 0 10*3/uL (ref 0.0–0.1)
Basophils Relative: 1 % (ref 0.0–3.0)
Eosinophils Absolute: 0.1 10*3/uL (ref 0.0–0.7)
Eosinophils Relative: 3.2 % (ref 0.0–5.0)
HCT: 42 % (ref 39.0–52.0)
Hemoglobin: 14.5 g/dL (ref 13.0–17.0)
Lymphocytes Relative: 25.6 % (ref 12.0–46.0)
Lymphs Abs: 1.1 10*3/uL (ref 0.7–4.0)
MCHC: 34.5 g/dL (ref 30.0–36.0)
MCV: 90.9 fl (ref 78.0–100.0)
Monocytes Absolute: 0.6 10*3/uL (ref 0.1–1.0)
Monocytes Relative: 13.5 % — ABNORMAL HIGH (ref 3.0–12.0)
Neutro Abs: 2.3 10*3/uL (ref 1.4–7.7)
Neutrophils Relative %: 56.7 % (ref 43.0–77.0)
Platelets: 224 10*3/uL (ref 150.0–400.0)
RBC: 4.62 Mil/uL (ref 4.22–5.81)
RDW: 14.4 % (ref 11.5–15.5)
WBC: 4.1 10*3/uL (ref 4.0–10.5)

## 2019-04-25 LAB — BASIC METABOLIC PANEL
BUN: 19 mg/dL (ref 6–23)
CO2: 32 mEq/L (ref 19–32)
Calcium: 9.4 mg/dL (ref 8.4–10.5)
Chloride: 100 mEq/L (ref 96–112)
Creatinine, Ser: 0.96 mg/dL (ref 0.40–1.50)
GFR: 73.97 mL/min (ref >60.00)
Glucose, Bld: 106 mg/dL — ABNORMAL HIGH (ref 70–99)
Potassium: 3.8 mEq/L (ref 3.5–5.1)
Sodium: 138 mEq/L (ref 135–145)

## 2019-04-25 LAB — T4, FREE: Free T4: 0.92 ng/dL (ref 0.60–1.60)

## 2019-04-25 LAB — TSH: TSH: 1.34 u[IU]/mL (ref 0.35–4.50)

## 2019-04-25 LAB — LIPID PANEL
Cholesterol: 143 mg/dL (ref 0–200)
HDL: 55.3 mg/dL (ref >39.00)
LDL Cholesterol: 74 mg/dL (ref 0–99)
NonHDL: 88.17
Total CHOL/HDL Ratio: 3
Triglycerides: 72 mg/dL (ref 0.0–149.0)
VLDL: 14.4 mg/dL (ref 0.0–40.0)

## 2019-04-25 LAB — T3, FREE: T3, Free: 3.1 pg/mL (ref 2.3–4.2)

## 2019-04-25 LAB — PSA: PSA: 0.05 ng/mL — ABNORMAL LOW (ref 0.10–4.00)

## 2019-04-25 MED ORDER — LEVOTHYROXINE SODIUM 75 MCG PO TABS: 75.0000 ug | ORAL_TABLET | Freq: Every day | ORAL | 3 refills | Status: DC

## 2019-04-25 NOTE — Progress Notes (Signed)
Subjective:    Patient ID: David Benton, male    DOB: 06/30/1929, 86 y.o.   MRN: [id number]  HPI Here to follow up on issues. He feels great. He walks every day, and he goes to [employer] classes and boxing classes several days a week. His BP has been stable. His inguinal hernia does not bother him at all he sees [name] regularly for the Parkinsons.    Review of Systems  Constitutional: Negative.   HENT: Negative.   Eyes: Negative.   Respiratory: Negative.   Cardiovascular: Negative.   Gastrointestinal: Negative.   Genitourinary: Negative.   Musculoskeletal: Negative.   Skin: Negative.   Neurological: Positive for tremors.  Psychiatric/Behavioral: Negative.        Objective:   Physical Exam Constitutional:      General: He is not in acute distress.    Appearance: Normal appearance. He is well-developed. He is not diaphoretic.  HENT:     Head: Normocephalic and atraumatic.     Right Ear: External ear normal.     Left Ear: External ear normal.     Nose: Nose normal.     Mouth/Throat:     Pharynx: No oropharyngeal exudate.  Eyes:     General: No scleral icterus.       Right eye: No discharge.        Left eye: No discharge.     Conjunctiva/sclera: Conjunctivae normal.     Pupils: Pupils are equal, round, and reactive to light.  Neck:     Thyroid: No thyromegaly.     Vascular: No JVD.     Trachea: No tracheal deviation.  Cardiovascular:     Rate and Rhythm: Normal rate and regular rhythm.     Heart sounds: Normal heart sounds. No murmur. No friction rub. No gallop.   Pulmonary:     Effort: Pulmonary effort is normal. No respiratory distress.     Breath sounds: Normal breath sounds. No wheezing or rales.  Chest:     Chest wall: No tenderness.  Abdominal:     General: Bowel sounds are normal. There is no distension.     Palpations: Abdomen is soft. There is no mass.     Tenderness: There is no abdominal tenderness. There is no guarding or rebound.     Comments: The  right direct inguinal hernia remains small, reducible, and non-tender   Genitourinary:    Penis: Normal. No tenderness.      Prostate: Normal.     Rectum: Normal. Guaiac result negative.  Musculoskeletal:        General: No tenderness. Normal range of motion.     Cervical back: Neck supple.  Lymphadenopathy:     Cervical: No cervical adenopathy.  Skin:    General: Skin is warm and dry.     Coloration: Skin is not pale.     Findings: No erythema or rash.  Neurological:     Mental Status: He is alert and oriented to person, place, and time.     Cranial Nerves: No cranial nerve deficit.     Motor: No abnormal muscle tone.     Coordination: Coordination normal.     Deep Tendon Reflexes: Reflexes are normal and symmetric. Reflexes normal.     Comments: Resting tremors in both hands   Psychiatric:        Behavior: Behavior normal.        Thought Content: Thought content normal.        Judgment:  Judgment normal.           Assessment & Plan:  His HTN and Parkinsons are stable, get fasting labs to check lipids and the thyroid levels.  Alysia Penna, MD

## 2019-05-14 DIAGNOSIS — D485 Neoplasm of uncertain behavior of skin: Secondary | ICD-10-CM | POA: Diagnosis not present

## 2019-05-14 DIAGNOSIS — B351 Tinea unguium: Secondary | ICD-10-CM | POA: Diagnosis not present

## 2019-05-14 DIAGNOSIS — L819 Disorder of pigmentation, unspecified: Secondary | ICD-10-CM | POA: Diagnosis not present

## 2019-05-14 DIAGNOSIS — L570 Actinic keratosis: Secondary | ICD-10-CM | POA: Diagnosis not present

## 2019-05-15 ENCOUNTER — Other Ambulatory Visit: Payer: Self-pay | Admitting: Internal Medicine

## 2019-05-15 NOTE — Telephone Encounter (Signed)
Refill request

## 2019-05-17 ENCOUNTER — Other Ambulatory Visit: Payer: Self-pay | Admitting: Cardiovascular Disease

## 2019-05-17 NOTE — Telephone Encounter (Signed)
Pt calling requesting a refill on amoxicillin for a dental procedure. Would Dr. Burt Knack like to refill this medication? Please address

## 2019-05-29 ENCOUNTER — Other Ambulatory Visit: Payer: Self-pay

## 2019-05-29 ENCOUNTER — Ambulatory Visit (INDEPENDENT_AMBULATORY_CARE_PROVIDER_SITE_OTHER): Payer: Medicare Other | Admitting: *Deleted

## 2019-05-29 DIAGNOSIS — Z5181 Encounter for therapeutic drug level monitoring: Secondary | ICD-10-CM

## 2019-05-29 LAB — POCT INR: INR: 3.9 — AB (ref 2.0–3.0)

## 2019-05-29 NOTE — Patient Instructions (Signed)
Description    Hold today and then start taking 1.5 tablets every day except 2 tablets on Saturdays. [facility] # [phone]. Call with any concerns  Recheck INR in 3 weeks.

## 2019-06-03 DIAGNOSIS — C4442 Squamous cell carcinoma of skin of scalp and neck: Secondary | ICD-10-CM | POA: Diagnosis not present

## 2019-06-03 DIAGNOSIS — D044 Carcinoma in situ of skin of scalp and neck: Secondary | ICD-10-CM | POA: Diagnosis not present

## 2019-06-19 ENCOUNTER — Ambulatory Visit: Payer: Medicare Other | Admitting: *Deleted

## 2019-06-19 ENCOUNTER — Other Ambulatory Visit: Payer: Self-pay

## 2019-06-19 DIAGNOSIS — Z5181 Encounter for therapeutic drug level monitoring: Secondary | ICD-10-CM

## 2019-06-19 LAB — POCT INR: INR: 2.9 (ref 2.0–3.0)

## 2019-06-19 NOTE — Patient Instructions (Signed)
Description   Continue taking 1.5 tablets every day except 2 tablets on Saturdays. [facility] # [phone]. Call with any concerns  Recheck INR in 4 weeks.       

## 2019-07-15 DIAGNOSIS — L298 Other pruritus: Secondary | ICD-10-CM | POA: Diagnosis not present

## 2019-07-15 DIAGNOSIS — L905 Scar conditions and fibrosis of skin: Secondary | ICD-10-CM | POA: Diagnosis not present

## 2019-07-15 DIAGNOSIS — Z85828 Personal history of other malignant neoplasm of skin: Secondary | ICD-10-CM | POA: Diagnosis not present

## 2019-07-15 DIAGNOSIS — L570 Actinic keratosis: Secondary | ICD-10-CM | POA: Diagnosis not present

## 2019-07-17 ENCOUNTER — Ambulatory Visit: Payer: Medicare Other

## 2019-07-17 ENCOUNTER — Other Ambulatory Visit: Payer: Self-pay

## 2019-07-17 DIAGNOSIS — Z5181 Encounter for therapeutic drug level monitoring: Secondary | ICD-10-CM | POA: Diagnosis not present

## 2019-07-17 DIAGNOSIS — I359 Nonrheumatic aortic valve disorder, unspecified: Secondary | ICD-10-CM | POA: Diagnosis not present

## 2019-07-17 DIAGNOSIS — Z952 Presence of prosthetic heart valve: Secondary | ICD-10-CM | POA: Diagnosis not present

## 2019-07-17 LAB — POCT INR: INR: 2.9 (ref 2.0–3.0)

## 2019-07-17 NOTE — Patient Instructions (Signed)
Description   Continue on same dosage 1.5 tablets every day except 2 tablets on Saturdays. [facility] # [phone]. Call with any concerns  Recheck INR in 6 weeks.

## 2019-07-25 NOTE — Progress Notes (Signed)
Assessment/Plan:   1.  Parkinsons Disease  -Recommended and encouraged the patient to start levodopa.  He is rigid.  He is agreeable today.  We will work today to carbidopa/levodopa 25/100, 1 po tid at 7am/11am/4pm.  R/B/SE were discussed.  The opportunity to ask questions was given and they were answered to the best of my ability.  The patient expressed understanding and willingness to follow the outlined treatment protocols.  -he is following with dermatology.    -we discussed ST but he declined for now  2.  Memory change  -suspect mci.  Do not suspect that the patient has a neurodegenerative memory decline currently   Subjective:   David Benton was seen today in follow up for Parkinsons disease.  My previous records were reviewed prior to todays visit as well as outside records available to me.  Patient declined medication last visit (against recommendation).  Pt denies falls.  Pt denies lightheadedness, near syncope.  No hallucinations.  Mood has been good.  Pt did see dermatology since our last office and had some basal cell CA removed off head.  Primary care records are reviewed from April 8.  Patient continues to exercise.  Wife sent me written note in mail about voice being low but pt being resistant to ST.  She is not present today.  She asked me to discuss with the patient.  Pt does c/o short term memory change.  Current prescribed movement disorder medications: None   ALLERGIES:   Allergies  Allergen Reactions  . Codeine     REACTION: N/V    CURRENT MEDICATIONS:  Outpatient Encounter Medications as of 07/29/2019  Medication Sig  . acetaminophen (TYLENOL) 325 MG tablet Take 650 mg by mouth every 6 (six) hours as needed for pain.  Marland Kitchen amoxicillin (AMOXIL) 500 MG capsule Take 4 capsules (2,000 mg) one hour prior to all dental visits.  Marland Kitchen atorvastatin (LIPITOR) 40 MG tablet TAKE 1 TABLET BY MOUTH EVERY DAY  . levothyroxine (SYNTHROID) 75 MCG tablet Take 1 tablet (75 mcg  total) by mouth daily.  . magnesium hydroxide (MILK OF MAGNESIA) 400 MG/5ML suspension Take 30 mLs by mouth daily as needed for indigestion. Reported on 04/21/2015  . Probiotic Product (PROBIOTIC-10 ULTIMATE) CAPS Take 1 capsule by mouth daily.  . sodium chloride (OCEAN) 0.65 % SOLN nasal spray Place 1 spray into both nostrils as needed for congestion. Reported on 04/21/2015  . triamterene-hydrochlorothiazide (MAXZIDE-25) 37.5-25 MG tablet TAKE 1 TABLET BY MOUTH EVERY MORNING  . warfarin (COUMADIN) 5 MG tablet TAKE AS DIRECTED BY COUMADIN CLINIC for 30 days   No facility-administered encounter medications on file as of 07/29/2019.    Objective:   PHYSICAL EXAMINATION:    VITALS:   Vitals:   07/29/19 1344  BP: (!) 158/69  Pulse: 82  SpO2: 98%  Weight: 142 lb (64.4 kg)  Height: 5\' 10"  (1.778 m)    GEN:  The patient appears stated age and is in NAD. HEENT:  Normocephalic, atraumatic.  The mucous membranes are moist. The superficial temporal arteries are without ropiness or tenderness. CV:  RRR Lungs:  CTAB Neck/HEME:  There are no carotid bruits bilaterally.  Neurological examination:  Orientation: The patient is alert and oriented x3. Cranial nerves: There is good facial symmetry with facial hypomimia. The speech is fluent and clear. Soft palate rises symmetrically and there is no tongue deviation. Hearing is decreased to conversational tone. Sensation: Sensation is intact to light touch throughout Motor: Strength is  at least antigravity x4.  Movement examination: Tone: There is mild to moderate increased tone in the left upper extremity and mild increased tone in the right upper extremity Abnormal movements: There is LUE rest tremor.  There is rare RUE rest tremor Coordination:  There is min decremation with RAM's, Gait and Station: The patient has no difficulty arising out of a deep-seated chair without the use of the hands. The patient's stride length is good.    I have  reviewed and interpreted the following labs independently    Chemistry      Component Value Date/Time   NA 138 04/25/2019 1026   NA 142 07/08/2016 1449   K 3.8 04/25/2019 1026   CL 100 04/25/2019 1026   CO2 32 04/25/2019 1026   BUN 19 04/25/2019 1026   BUN 26 07/08/2016 1449   CREATININE 0.96 04/25/2019 1026   CREATININE 1.09 07/30/2014 1400      Component Value Date/Time   CALCIUM 9.4 04/25/2019 1026   ALKPHOS 89 04/25/2019 1026   AST 26 04/25/2019 1026   ALT 24 04/25/2019 1026   BILITOT 1.9 (H) 04/25/2019 1026   BILITOT 1.7 (H) 07/08/2016 1449       Lab Results  Component Value Date   WBC 4.1 04/25/2019   HGB 14.5 04/25/2019   HCT 42.0 04/25/2019   MCV 90.9 04/25/2019   PLT 224.0 04/25/2019    Lab Results  Component Value Date   TSH 1.34 04/25/2019     Total time spent on today's visit was 30 minutes, including both face-to-face time and nonface-to-face time.  Time included that spent on review of records (prior notes available to me/labs/imaging if pertinent), discussing treatment and goals, answering patient's questions and coordinating care.  Cc:  [name], MD

## 2019-07-29 ENCOUNTER — Other Ambulatory Visit: Payer: Self-pay

## 2019-07-29 ENCOUNTER — Encounter: Payer: Self-pay | Admitting: Neurology

## 2019-07-29 ENCOUNTER — Ambulatory Visit: Payer: Medicare Other | Admitting: Neurology

## 2019-07-29 VITALS — BP 158/69 | HR 82 | Ht 70.0 in | Wt 142.0 lb

## 2019-07-29 DIAGNOSIS — G20 Parkinson's disease: Secondary | ICD-10-CM | POA: Diagnosis not present

## 2019-07-29 MED ORDER — CARBIDOPA-LEVODOPA 25-100 MG PO TABS
1.0000 | ORAL_TABLET | Freq: Three times a day (TID) | ORAL | 1 refills | Status: DC
Start: 2019-07-29 — End: 2020-01-30

## 2019-07-29 NOTE — Patient Instructions (Addendum)
1.  Start Carbidopa Levodopa as follows:  Take 1/2 tablet three times daily, at least 30 minutes before meals (approximately 7am/11am/4pm), for one week  Then take 1/2 tablet in the morning, 1/2 tablet in the afternoon, 1 tablet in the evening, at least 30 minutes before meals, for one week  Then take 1/2 tablet in the morning, 1 tablet in the afternoon, 1 tablet in the evening, at least 30 minutes before meals, for one week  Then take 1 tablet three times daily at 7am/11am/4pm, at least 30 minutes before meals   As a reminder, carbidopa/levodopa can be taken at the same time as a carbohydrate, but we like to have you take your pill either 30 minutes before a protein source or 1 hour after as protein can interfere with carbidopa/levodopa absorption.  2.  Let me know if you want a referral for the speech therapy for Parkinsons Disease   Parkinsons Community Resources   . Local [place] Online Groups (*Please note that David Benton , MSW, LCSW, has resigned her position at [facility], but will continue to lead the online groups temporarily) o Power over Parkinson's Group :   - Power Over Parkinson's Patient Education Group will be Wednesday July 14th at 2pm via [place].   - Upcoming Power over Parkinson's Meetings:  2nd Wednesdays of the month at 2 pm:       August 11th, September 8th - David Benton, PT at [facility] will be resuming the lead of this group starting in July.  Contact David at [email or web address] if interested in participating in this online group o Parkinson's Care Partners Group:    3rd Mondays, Contact David Benton o Atypical Parkinsonian Patient Group:   4th Wednesdays, Contact David Benton o If you are interested in participating in these online groups with David Benton, please contact her directly for how to join those meetings.  Her contact information is [email or web address].  She will send you a link to join the [employer]  . [place]:  [email or web address] o PD Health at Home continues:  Mindfulness Mondays, Expert Briefing Tuesdays, Wellness Wednesdays, Take Time Thursdays, Fitness Fridays (New videos for Fitness Fridays!)   o Upcoming Webinar:  Stay tuned on their website for upcoming webinars o Please check out their website to sign up for emails and see their full online offerings  . [place]:  [email or web address]  o Upcoming Webinar:   What Comes Next?  Living with Parkinson's Years after Diagnosis.  Thursday, July 15th at 12 noon. o Upcoming Webinar:  Caregivers Matter.  Tell Congress.  Wednesday, July 28th at 3 pm o Check out additional information on their website to see their full online offerings  . [place]:  [email or web address] o Upcoming webinar:   Managing Mood and Anxiety in Parkinson's.  Tuesday, July 20th  at 2 pm. o Check out additional information to Live Well Today on their website  . Parkinson and Movement Disorders (PMD) Alliance:  www.pmdalliance.org o [employer] Online:  Online Education Events o Sign up for emails, which are sent weekly to give you updates on programming and online offerings  . Parkinson's Association of the [place]:  www.[email or web address] o Information on online support groups, online exercises, Parkinson's exercises and more-LOTS of information on links to PD resources and online events o Virtual Support Group through Parkinson's Association of the [place]; next one is scheduled for Wednesday, August 21, 2019 at 2 pm. (These are typically scheduled for the 1st Wednesday of  the month at 2 pm).  Visit website for details. o Caring for Parkinson's, Caring for You Symposium.  Free educational event, September 28, 2019.  [place], [place].  Sign up on Parkinson's Association of the [employer].  . Additional links for movement activities: o PWR! Moves Classes at [place] are  returning!  Beginning mid-July (initially, we will be offering spaces to previous participants, but if you are interested, please contact David Benton, PT) [email or web address] or [phone] o Parkinson's Wellness Recovery (PWR! Moves)  www.[email or web address] - Info on the PWR! Virtual Experience:  You will have access to our expertise through self-assessment, guided plans that start with the PD-specific fundamentals, educational content, tips, Q&A with an expert, and a growing Art therapist of PD-specific pre-recorded and live exercise classes of varying types and intensity - both physical and cognitive! If that is not enough, we offer 1:1 wellness consultations (in-person or virtual) to personalize your PWR! Research scientist (medical).  - Check out the PWR! Move of the month on the [facility] website:  [email or web address] o [employer] Fridays:  - As part of the PD Health @ Home program, this free 10-part video series focuses each week on one aspect of fitness designed to support people living with Parkinson's.  -  [email or web address].dk o Dance for PD website is offering free, live-stream classes throughout the week, as well as links to [employer] of classes:  [email or web address] o Transport planner for Parkinson's Class:  [place].  Tuesdays 1-2 pm via Zoom.  Free offering for people with Parkinson's and care partners.  For more information, contact [phone]  email: info@danceproject [email or web address] o Virtual dance and Pilates for Parkinson's classes: Click on the Community Tab> Parkinson's Movement Initiative Tab.  To register for classes and for more information, visit [email or web address] and click the "community" tab.  o [employer] Parkinson's Cycling Classes  - [employer]: 1pm on Fridays-Live classes at [facility] [employer] at  [email or web address] or [phone]) [employer]: Virtual Classes Mondays and Thursdays (contact David Benton at [email or web address] or [phone]), Zoom link is:  [employer]  [email or web address] Meeting ID: [id number] Passcode: [place] are offered Tuesdays and Thursdays at 12 p.m. & 1:45 pm at [employer]. To observe a class or for  more information, call [phone], visit [email or web address] or [email or web address].

## 2019-08-01 DIAGNOSIS — H906 Mixed conductive and sensorineural hearing loss, bilateral: Secondary | ICD-10-CM | POA: Diagnosis not present

## 2019-08-02 ENCOUNTER — Other Ambulatory Visit: Payer: Self-pay | Admitting: Cardiothoracic Surgery

## 2019-08-02 DIAGNOSIS — I712 Thoracic aortic aneurysm, without rupture, unspecified: Secondary | ICD-10-CM

## 2019-08-05 ENCOUNTER — Ambulatory Visit (INDEPENDENT_AMBULATORY_CARE_PROVIDER_SITE_OTHER): Payer: Medicare Other | Admitting: Family Medicine

## 2019-08-05 ENCOUNTER — Other Ambulatory Visit: Payer: Self-pay

## 2019-08-05 ENCOUNTER — Encounter: Payer: Self-pay | Admitting: Family Medicine

## 2019-08-05 VITALS — BP 130/60 | HR 77 | Temp 98.4°F | Wt 143.0 lb

## 2019-08-05 DIAGNOSIS — L84 Corns and callosities: Secondary | ICD-10-CM | POA: Diagnosis not present

## 2019-08-05 NOTE — Progress Notes (Signed)
   Subjective:    Patient ID: JAIDIN RICHISON, male    DOB: Apr 04, 1929, 86 y.o.   MRN: [id number]  HPI Here for 2 weeks of pain on the bottom of the right foot when he walks on it. He walks every day for exercise, and lately it has become painful. He has calluses on both feet. He wears good supportive shoes.    Review of Systems  Constitutional: Negative.   Respiratory: Negative.   Cardiovascular: Negative.   Musculoskeletal: Positive for arthralgias.       Objective:   Physical Exam Constitutional:      Appearance: Normal appearance.  Cardiovascular:     Rate and Rhythm: Normal rate and regular rhythm.     Pulses: Normal pulses.     Heart sounds: Normal heart sounds.  Pulmonary:     Effort: Pulmonary effort is normal.     Breath sounds: Normal breath sounds.  Skin:    Comments: He has large calluses on the bottoms of both feet over the first and fifth metatarsal heads. The ones on the right are tender   Neurological:     Mental Status: He is alert.           Assessment & Plan:  Calluses, refer to Podiatry.  Alysia Penna, MD

## 2019-08-07 ENCOUNTER — Ambulatory Visit: Payer: Medicare Other | Admitting: Podiatry

## 2019-08-07 ENCOUNTER — Telehealth: Payer: Self-pay | Admitting: Family Medicine

## 2019-08-07 NOTE — Telephone Encounter (Signed)
Patient returned call, requesting a call back.  Call back later this afternoon.  He is leaving the house and will be out a little bit.

## 2019-08-07 NOTE — Telephone Encounter (Signed)
I have not reached out to this patient. I do not see in his chart where anyone from this office has called him.

## 2019-08-07 NOTE — Telephone Encounter (Signed)
Returned call to pt and let him know that Ria Comment has not called him.  He said he would listen to his voicemail again to see who it was that called him.

## 2019-08-12 ENCOUNTER — Other Ambulatory Visit: Payer: Self-pay | Admitting: Internal Medicine

## 2019-08-12 NOTE — Telephone Encounter (Signed)
Please review for refill on Warfarin. Thanks!  

## 2019-08-14 ENCOUNTER — Other Ambulatory Visit: Payer: Self-pay

## 2019-08-14 ENCOUNTER — Encounter: Payer: Self-pay | Admitting: Podiatry

## 2019-08-14 ENCOUNTER — Ambulatory Visit (INDEPENDENT_AMBULATORY_CARE_PROVIDER_SITE_OTHER): Payer: Medicare Other | Admitting: Podiatry

## 2019-08-14 DIAGNOSIS — M21969 Unspecified acquired deformity of unspecified lower leg: Secondary | ICD-10-CM | POA: Diagnosis not present

## 2019-08-14 DIAGNOSIS — L84 Corns and callosities: Secondary | ICD-10-CM

## 2019-08-14 DIAGNOSIS — M7741 Metatarsalgia, right foot: Secondary | ICD-10-CM

## 2019-08-14 DIAGNOSIS — M79671 Pain in right foot: Secondary | ICD-10-CM | POA: Diagnosis not present

## 2019-08-14 DIAGNOSIS — M79672 Pain in left foot: Secondary | ICD-10-CM

## 2019-08-14 DIAGNOSIS — M7742 Metatarsalgia, left foot: Secondary | ICD-10-CM

## 2019-08-15 NOTE — Progress Notes (Signed)
  Subjective:  Patient ID: David Benton, male    DOB: February 07, 1929,  MRN: [id number]  Chief Complaint  Patient presents with  . Callouses    painful preulcerative callus lesion, patient is taking Coumadin    86 y.o. male presents with the above complaint. History confirmed with patient.  He has very painful calluses, he walks quite a bit and has new shoes.  He walks with his wife and they belong to walking club.  He tried Dr. Buford Dresser corn pads on them and it made them worse.  Objective:  Physical Exam: warm, good capillary refill, no trophic changes or ulcerative lesions, normal DP and PT pulses and normal sensory exam. Left Foot: Submet 5 large hyperkeratosis Right Foot: Submet 5 and submet 1 large hyperkeratoses, the one under the fifth has some petechial bleeding deep to it, no ulceration    Assessment:   1. Pain in both feet   2. Metatarsalgia of both feet   3. Callus of foot   4. Deformity of metatarsal, unspecified laterality      Plan:  Patient was evaluated and treated and all questions answered.  All symptomatic hyperkeratoses were safely debrided with a sterile #15 blade to patient's level of comfort without incident. We discussed preventative and palliative care of these lesions including supportive and accommodative shoegear, padding, prefabricated and custom molded accommodative orthoses, use of a pumice stone and lotions/creams daily.  Discussed the reason why he develops any calluses due to his foot type, metatarsalgia, and metatarsal deformities.  I put dancers pads in both his insoles to offload the fifth metatarsal areas.  [name], DPM 08/15/2019    No follow-ups on file.

## 2019-08-16 ENCOUNTER — Encounter: Payer: Self-pay | Admitting: Family Medicine

## 2019-08-28 ENCOUNTER — Other Ambulatory Visit: Payer: Self-pay

## 2019-08-28 ENCOUNTER — Ambulatory Visit: Payer: Medicare Other | Admitting: *Deleted

## 2019-08-28 DIAGNOSIS — Z5181 Encounter for therapeutic drug level monitoring: Secondary | ICD-10-CM | POA: Diagnosis not present

## 2019-08-28 LAB — POCT INR: INR: 4 — AB (ref 2.0–3.0)

## 2019-08-28 NOTE — Patient Instructions (Signed)
Description   Hold today, then Continue on same dosage 1.5 tablets every day except 2 tablets on Saturdays. Coumadin Clinic # [phone]. Call with any concerns  Recheck INR in 3 weeks.

## 2019-08-29 ENCOUNTER — Encounter: Payer: Medicare Other | Admitting: Family Medicine

## 2019-09-13 ENCOUNTER — Telehealth: Payer: Self-pay | Admitting: Family Medicine

## 2019-09-13 NOTE — Progress Notes (Signed)
  Chronic Care Management   Outreach Note  09/13/2019 Name: David Benton MRN: [id number] DOB: 1929/11/29  Referred by: [name], MD Reason for referral : No chief complaint on file.   An unsuccessful telephone outreach was attempted today. The patient was referred to the pharmacist for assistance with care management and care coordination.   Follow Up Plan:   Carley Perdue [employer] Scheduler

## 2019-09-16 ENCOUNTER — Telehealth: Payer: Self-pay | Admitting: Family Medicine

## 2019-09-16 NOTE — Progress Notes (Signed)
  Chronic Care Management   Note  09/16/2019 Name: AYDEEN BLUME MRN: [id number] DOB: 05/13/1929  LATAVION HALLS is a 86 y.o. year old male who is a primary care patient of [name], MD. I reached out to Owens Loffler by phone today in response to a referral sent by Mr. Tre Sanker Beckworth's PCP, [name], MD.   Mr. Earhart was given information about Chronic Care Management services today including:  1. CCM service includes personalized support from designated clinical staff supervised by his physician, including individualized plan of care and coordination with other care providers 2. 24/7 contact phone numbers for assistance for urgent and routine care needs. 3. Service will only be billed when office clinical staff spend 20 minutes or more in a month to coordinate care. 4. Only one practitioner may furnish and bill the service in a calendar month. 5. The patient may stop CCM services at any time (effective at the end of the month) by phone call to the office staff.   Patient agreed to services and verbal consent obtained.   Follow up plan:   Carley Perdue [employer] Scheduler

## 2019-09-18 ENCOUNTER — Ambulatory Visit: Payer: Medicare Other | Admitting: *Deleted

## 2019-09-18 ENCOUNTER — Other Ambulatory Visit: Payer: Self-pay

## 2019-09-18 DIAGNOSIS — Z5181 Encounter for therapeutic drug level monitoring: Secondary | ICD-10-CM | POA: Diagnosis not present

## 2019-09-18 LAB — POCT INR: INR: 3.2 — AB (ref 2.0–3.0)

## 2019-09-18 NOTE — Patient Instructions (Signed)
Description   Continue on same dosage 1.5 tablets every day except 2 tablets on Saturdays. Coumadin Clinic # [phone]. Call with any concerns  Recheck INR in 4 weeks.

## 2019-09-18 NOTE — Progress Notes (Signed)
[address]       [place] [address]             [phone]             David Benton David Benton Medical Record #[id number] Date of Birth: January 21, 1929  Referring:  Dr Einar Crow Primary Care: [name], MD Primary Cardiology: Sherren Mocha MD Chief Complaint:    Chief Complaint  Patient presents with  . Thoracic Aortic Aneurysm    1 year f/u with CTA Chest    History of Present Illness:    Patient returns to the office today in followup after aortic valve replacement with a 29 mm St. Jude mechanical valve (29A-101  SN [id number]) in August of 1991  for aortic stenosis and bicuspid aortic valve. He was seen in April of 2009 because of aortic dilation on echocardiogram.  Since that time he's had serial CT scans to evaluate the size of his aorta . At age 86 he remains active exercising daily  He continues on Coumadin without evidence of significant bleeding complication . As of  August the patient is now 86  years following his aortic valve replacement.  In spite of his Parkinson's continues to be active, notes today he is doing resume boxing classes to assist in his physical therapy related to his Parkinson's  Current Activity/ Functional Status: Patient is  independent with mobility/ambulation, transfers, ADL's, IADL's.   Past Medical History:  Diagnosis Date  . AVD (aortic valve disease) 02/25/2010   sees [name]   . Bilateral carotid artery stenosis 07/10/2016   Carotid US 10/2018: Bilateral ICA 1-39; right subclavian 1.7 cm (no change since 03/2017)  . External hemorrhoids without mention of complication   . Heart valve replaced by other means   . Long term (current) use of anticoagulants 09/15/2010  . Meniere's disease   . Other and unspecified hyperlipidemia   . Other constipation   . Parkinson disease ([place]) 02/2016   no meds, sees [name]   . Personal history of colonic polyps   . Personal history of malignant neoplasm of prostate   .  Right inguinal hernia 11/18/2016  . Shingles   . SHINGLES 11/26/2009  . Thoracic aneurysm without mention of rupture    followed by [name] with yearly CT scans   . Unspecified essential hypertension     Past Surgical History:  Procedure Laterality Date  . AORTIC VALVE REPLACEMENT  1991    st. jude's valve, per [name]   . CATARACT EXTRACTION Left 12/2018  . COLONOSCOPY  06-28-10   per Dr. Henrene Pastor, benign polyps, no further scopes needed   . HEMORRHOID SURGERY    . INGUINAL HERNIA REPAIR Right 11/18/2016   Procedure: OPEN REPAIR RIGHT INGUINAL HERNIA WITH MESH;  Surgeon: Fanny Skates, MD;  Location: [facility];  Service: General;  Laterality: Right;  . INSERTION OF MESH Right 11/18/2016   Procedure: INSERTION OF MESH;  Surgeon: Fanny Skates, MD;  Location: [facility];  Service: General;  Laterality: Right;  . PROSTATECTOMY  1995   per Dr. Roni Bread   . TONSILLECTOMY      Family History  Problem Relation Age of Onset  . Heart attack Father 44       fatal at 71  . Hypertension Father   . CAD Father   . Other Father        possible PD as says had "  palsy"  . Prostate cancer Brother   . Diabetes Brother   . Heart attack Mother   . Diabetes Brother   . Stroke Brother   . Heart disease Brother        carotid artery disease  . Hypertension Brother   . Breast cancer Sister        mastectomy at 53  . Colon cancer Neg Hx     Social History   Socioeconomic History  . Marital status: Married    Spouse name: Not on file  . Number of children: 0  . Years of education: 50  . Highest education level: Not on file  Occupational History  . Occupation: retired    Comment: [employer]  Tobacco Use  . Smoking status: Former Smoker    Packs/day: 0.50    Years: 10.00    Pack years: 5.00    Types: Cigarettes    Quit date: 02/06/1972    Years since quitting: 47.6  . Smokeless tobacco: Never Used  Vaping Use  . Vaping Use:  Never used  Substance and Sexual Activity  . Alcohol use: Yes    Alcohol/week: 7.0 standard drinks    Types: 7 Standard drinks or equivalent per week    Comment: glass of wine daily; just about   . Drug use: No  . Sexual activity: Never  Other Topics Concern  . Not on file  Social History Narrative   HSG, a couple of years of college. Married '78 - . No children. Work - [employer] 7 years - retired.   Lives alone with wife and two dogs. ACP - Yes CPR, Yes - short-term mechanical ventilation. Does not want prolonged heroic measures in the face of irreversible disease. HCPOA - wife. Alternative is his brother: Reid [facility].       Right handed   .  Social History   Tobacco Use  Smoking Status Former Smoker  . Packs/day: 0.50  . Years: 10.00  . Pack years: 5.00  . Types: Cigarettes  . Quit date: 02/06/1972  . Years since quitting: 47.6  Smokeless Tobacco Never Used    Social History   Substance and Sexual Activity  Alcohol Use Yes  . Alcohol/week: 7.0 standard drinks  . Types: 7 Standard drinks or equivalent per week   Comment: glass of wine daily; just about      Allergies  Allergen Reactions  . Codeine     REACTION: N/V    Current Outpatient Medications  Medication Sig Dispense Refill  . acetaminophen (TYLENOL) 325 MG tablet Take 650 mg by mouth every 6 (six) hours as needed for pain.    Marland Kitchen amoxicillin (AMOXIL) 500 MG capsule Take 4 capsules (2,000 mg) one hour prior to all dental visits. 12 capsule 6  . atorvastatin (LIPITOR) 40 MG tablet TAKE 1 TABLET BY MOUTH EVERY DAY 30 tablet 11  . betamethasone dipropionate 0.05 % cream APPLY TO AFFECTED AREA OF SKIN UP TO 2 TIMES DAILY AS NEEDED. DO NOT APPLY TO FACE, GROIN, OR AXILLA    . carbidopa-levodopa (SINEMET IR) 25-100 MG tablet Take 1 tablet by mouth 3 (three) times daily. 270 tablet 1  . levothyroxine (SYNTHROID) 75 MCG tablet Take 1 tablet (75 mcg total) by mouth daily. 90 tablet 3  . magnesium  hydroxide (MILK OF MAGNESIA) 400 MG/5ML suspension Take 30 mLs by mouth daily as needed for indigestion. Reported on 04/21/2015    . Probiotic Product (  PROBIOTIC-10 ULTIMATE) CAPS Take 1 capsule by mouth daily.    . sodium chloride (OCEAN) 0.65 % SOLN nasal spray Place 1 spray into both nostrils as needed for congestion. Reported on 04/21/2015    . triamcinolone ointment (KENALOG) 0.1 % SMARTSIG:1 Sparingly Topical Twice Daily PRN    . triamterene-hydrochlorothiazide (MAXZIDE-25) 37.5-25 MG tablet TAKE 1 TABLET BY MOUTH EVERY MORNING 90 tablet 3  . warfarin (COUMADIN) 5 MG tablet TAKE AS DIRECTED BY COUMADIN CLINIC FOR 30 DAYS 50 tablet 3  . prednisoLONE acetate (PRED FORTE) 1 % ophthalmic suspension Place 1 drop into the right eye 4 (four) times daily. (Patient not taking: Reported on 09/19/2019)     No current facility-administered medications for this visit.       Review of Systems:     Cardiac Review of Systems: Y or N  Chest Pain [ N]  Resting SOB Aqua.Slicker ] Exertional SOB  [  N]  Orthopnea Aqua.Slicker  ]   Pedal Edema [ N ]    Palpitations N ] Syncope  Aqua.Slicker ]   Presyncope Aqua.Slicker  ]  General Review of Systems: [Y] = yes [  ]=no Constitional: recent weight change [  ]; anorexia [  ]; fatigue [  ]; nausea [  ]; night sweats [  ]; fever [  ]; or chills [  ];                                                                                                                                          Dental: poor dentition[  ]; Last Dentist visit:[facility] 2013   Eye : blurred vision [  ]; diplopia [   ]; vision changes [  ];  Amaurosis fugax[  ]; Resp: cough [ ] ;  wheezing[ ] ;  hemoptysis[ ] ; shortness of breath[  ]; paroxysmal nocturnal dyspnea[  ]; dyspnea on exertion[  ]; or orthopnea[  ];  GI:  gallstones[  ], vomiting[  ];  dysphagia[  ]; melena[  ];  hematochezia [  ]; heartburn[  ];   Hx of  Colonoscopy[ NOT NEEDED ANY MORE ]; GU: kidney stones [  ]; hematuria[  ];   dysuria [  ];  nocturia[  ];  history of      obstruction [  ];             Skin: rash, swelling[  ];, hair loss[  ];  peripheral edema[  ];  or itching[  ]; Musculosketetal: myalgias[  ];  joint swelling[  ];  joint erythema[  ];  joint pain[  ];  back pain[  ];  Heme/Lymph: bruising[  ];  bleeding[  ];  anemia[  ];  Neuro: TIA[  ];  headaches[  ];  stroke[  ];  vertigo[  ];  seizures[  ];   paresthesias[  ];  difficulty walking[  ];tremour left hand  Psych:depression[  ]; anxiety[  ];  Endocrine: diabetes[  ];  thyroid dysfunction[  ];  Immunizations: Flu [ y  ]; Pneumococcal[ y   ];  Other:  Physical Exam: BP (!) 150/74   Pulse 65   Temp 97.6 F (36.4 C) (Skin)   Resp 20   Ht 5\' 10"  (1.778 m)   Wt 140 lb (63.5 kg)   SpO2 96% Comment: RA  BMI 20.09 kg/m  General appearance: alert, cooperative and no distress Head: Normocephalic, without obvious abnormality, atraumatic Neck: no adenopathy, no carotid bruit, no JVD, supple, symmetrical, trachea midline and thyroid not enlarged, symmetric, no tenderness/mass/nodules Resp: clear to auscultation bilaterally Cardio: regular rate and rhythm, S1, S2 normal, no murmur, click, rub or gallop GI: soft, non-tender; bowel sounds normal; no masses,  no organomegaly Extremities: extremities normal, atraumatic, no cyanosis or edema and Homans sign is negative, no sign of DVT Neurologic: Grossly normal Sternum stable valve sounds are crisp,  Diagnostic Studies & Laboratory data:     Recent Radiology Findings: CT HEAD WO CONTRAST  Result Date: 09/30/2019 CLINICAL DATA:  Central vertigo.  History of prostate cancer. EXAM: CT HEAD WITHOUT CONTRAST TECHNIQUE: Contiguous axial images were obtained from the base of the skull through the vertex without intravenous contrast. COMPARISON:  CT head 03/18/2016 FINDINGS: Brain: Generalized atrophy. Negative for hydrocephalus. Negative for acute infarct, hemorrhage, mass Vascular: Negative for hyperdense vessel Skull: Negative Sinuses/Orbits: Paranasal  sinuses clear. Bilateral cataract extraction Other: None IMPRESSION: Mild atrophy.  No acute abnormality. Electronically Signed   By: Franchot Gallo M.D.   On: 09/30/2019 14:09   CT ANGIO CHEST AORTA W/CM & OR WO/CM  Result Date: 09/19/2019 CLINICAL DATA:  Followup thoracic aortic aneurysm. EXAM: CT ANGIOGRAPHY CHEST WITH CONTRAST TECHNIQUE: Multidetector CT imaging of the chest was performed using the standard protocol during bolus administration of intravenous contrast. Multiplanar CT image reconstructions and MIPs were obtained to evaluate the vascular anatomy. CONTRAST:  29mL ISOVUE-370 IOPAMIDOL (ISOVUE-370) INJECTION 76% COMPARISON:  09/13/2018 FINDINGS: Cardiovascular: Aortic atherosclerosis. The ascending thoracic aorta measures 4.6 x 4.5 cm, image 79/4. This is unchanged when compared with previous exam. No signs of thoracic aortic dissection. Previous median sternotomy and aortic valve repair. Normal heart size. No pericardial effusion. Coronary artery calcifications noted. Mediastinum/Nodes: No enlarged mediastinal, hilar, or axillary lymph nodes. Thyroid gland, trachea, and esophagus demonstrate no significant findings. Lungs/Pleura: No pleural effusion. No airspace consolidation, atelectasis or pneumothorax identified. Stable nodule within the posterior left upper lobe measuring 3 mm, image 35/6. 4 mm right middle lobe lung nodule is also unchanged, image 116/6. No new or enlarging lung nodules. Upper Abdomen: No acute abnormality identified within the imaged portions of the upper abdomen. Bilateral renal cortical scarring and thinning identified. Musculoskeletal: Mild scoliosis with degenerative disc disease. No acute or suspicious osseous findings identified. Review of the MIP images confirms the above findings. IMPRESSION: 1. Stable appearance of ascending thoracic aortic aneurysm measuring 4.6 x 4.5 cm. Ascending thoracic aortic aneurysm. Recommend semi-annual imaging followup by CTA or MRA and  referral to cardiothoracic surgery if not already obtained. This recommendation follows 2010 ACCF/AHA/AATS/ACR/ASA/SCA/SCAI/SIR/STS/SVM Guidelines for the Diagnosis and Management of Patients With Thoracic Aortic Disease. Circulation. 2010; 121: B716-R678. Aortic aneurysm NOS (ICD10-I71.9) 2. Small nonspecific pulmonary nodules are unchanged from previous exam. These both measure less than 5 mm and are compatible with a benign process. 3. Coronary artery calcifications. Aortic Atherosclerosis (ICD10-I70.0). Electronically Signed   By: Kerby Moors M.D.   On: 09/19/2019  10:20   I have independently reviewed the above radiology studies  and reviewed the findings with the patient.  Ct Angio Chest Aorta W/cm &/or Wo/cm  Result Date: 09/01/2016 CLINICAL DATA:  Followup aneurysm. EXAM: CT ANGIOGRAPHY CHEST WITH CONTRAST TECHNIQUE: Multidetector CT imaging of the chest was performed using the standard protocol during bolus administration of intravenous contrast. Multiplanar CT image reconstructions and MIPs were obtained to evaluate the vascular anatomy. CONTRAST:  75 cc of Isovue 370 Creatinine was obtained on site at [facility] at [address] Results: Creatinine 1.1 mg/dL. COMPARISON:  08/20/2015 FINDINGS: Cardiovascular: Moderate cardiac enlargement. Aortic atherosclerosis. Measured at the same level as previously the ascending thoracic aorta measures 4.3 by 4.4 cm, image 65 of series 4. Unchanged from previous exam. The anterior arch measures 3.2 cm, image 41 of series 4. Also unchanged. The posterior arch measures 3.3 cm, image 41 of series 4. Stable from previous exam. The descending thoracic aorta is stable measuring 3.1 cm. Previous CABG procedure. Mediastinum/Nodes: No enlarged mediastinal, hilar, or axillary lymph nodes. Thyroid gland, trachea, and esophagus demonstrate no significant findings. Lungs/Pleura: No pleural effusion. Mild diffuse bronchial wall thickening noted. No airspace  opacities identified. Upper Abdomen: No acute abnormality. Musculoskeletal: Mild scoliosis and degenerative disc disease identified. Review of the MIP images confirms the above findings. IMPRESSION: 1. Stable thoracic Aortic aneurysm NOS (ICD10-I71.9) measuring 4.4 cm. Recommend annual imaging followup by CTA or MRA. This recommendation follows 2010 ACCF/AHA/AATS/ACR/ASA/SCA/SCAI/SIR/STS/SVM Guidelines for the Diagnosis and Management of Patients with Thoracic Aortic Disease. Circulation. 2010; 121: A416-S063 2.  Aortic Atherosclerosis (ICD10-I70.0). Electronically Signed   By: Kerby Moors M.D.   On: 09/01/2016 11:11    Ct Angio Chest Aorta W/cm &/or Wo/cm  08/01/2013   CLINICAL DATA:  Thoracic aortic aneurysmal disease with history of aortic valve replacement.  EXAM: CT ANGIOGRAPHY CHEST WITH CONTRAST  TECHNIQUE: Multidetector CT imaging of the chest was performed using the standard protocol during bolus administration of intravenous contrast. Multiplanar CT image reconstructions and MIPs were obtained to evaluate the vascular anatomy.  CONTRAST:  26mL OMNIPAQUE IOHEXOL 350 MG/ML SOLN  COMPARISON:  02/16/2012 and other prior CTA studies.  FINDINGS: Measured maximal diameter of the ascending thoracic aorta is 4.3 cm. The 4.6 cm measurement previously may have been slightly overestimated. There is no evidence of aortic dissection or intramural hemorrhage. The proximal arch measures 3.3 cm. The distal arch measures 3.2 cm. The descending thoracic aorta measures 3.2 cm. Stable mild atherosclerotic plaque involving the arch and proximal great vessels without evidence of ulcerated plaque or penetrating ulcer. Proximal great vessels showed no significant stenosis.  The heart size appears stable. There is no evidence of pleural or pericardial fluid. Lungs show no evidence of edema, consolidation or nodule. No enlarged lymph nodes are identified. Stable calcified plaque in the distribution of the LAD and left  circumflex coronary arteries. Visualized upper abdomen is unremarkable. Bony structures are unremarkable.  Review of the MIP images confirms the above findings.  IMPRESSION: No further enlargement of the ascending thoracic aorta. Current maximal diameter measurement is 4.3 cm by CTA and appears to be stable compared to the 06/2010 study. Previous measurement of 4.6 cm may have been slightly overestimated based on axis of measurement.   Electronically Signed   By: Aletta Edouard M.D.   On: 08/01/2013 13:36    Ct Angio Chest Aorta W/cm &/or Wo/cm  08/14/2012   *RADIOLOGY REPORT*  Clinical Data:  90-month follow-up thoracic aortic aneurysm and  liver lesion.  CT ANGIOGRAPHY CHEST  Technique:  Multidetector CT imaging of the chest using the standard protocol during bolus administration of intravenous contrast. Multiplanar reconstructed images including MIPs were obtained and reviewed to evaluate the vascular anatomy.  Contrast: 29mL OMNIPAQUE IOHEXOL 350 MG/ML SOLN  Comparison: Prior CTA of the chest 02/16/2012  Findings:  Mediastinum: Unremarkable CT appearance of the thyroid gland.  No suspicious mediastinal or hilar adenopathy.  No soft tissue mediastinal mass.  The thoracic esophagus is unremarkable.  Heart/Vascular: Surgical changes of mechanical aortic valve repair. There is persistent dilatation of the aortic root up to 4.3 cm at the sinuses of Valsalva.  This is not significantly changed compared to prior.  The aorta measures approximately 4.2 cm at the sinotubular junction.  The ascending aorta is aneurysmally dilated with a maximal diameter of 4.6 cm, unchanged compared to prior. The transverse aorta measures 3.4 cm.  The proximal descending thoracic aorta measures 3.6 cm.  Scattered atherosclerotic vascular calcifications.  Extensive atherosclerotic calcifications noted throughout the coronary arteries. Mild ectasia of the right subclavian artery to 1.6 cm. Stable borderline cardiomegaly.  Lungs/Pleura:  Trace biapical pleural parenchymal scarring.  Trace dependent atelectasis.  The lungs are otherwise clear.  Upper Abdomen: Stable 6 - 7 mm hypoattenuating lesion in the posterior aspect of hepatic segments six.  No significant interval growth.  Sludge and/or small stones layer within the gallbladder lumen.  Bilateral renal cortical thinning with scattered areas of scarring.  Otherwise, unremarkable upper abdomen.  Bones: No acute fracture or aggressive appearing lytic or blastic osseous lesion.  T12 hemangioma.  Healed median sternotomy.  IMPRESSION:  1.  Stable ascending aortic aneurysm with maximal diameter of the ascending tubular segment at 4.6 cm and  maximal diameter of the sinotubular junction 4.3 cm.  2.  Stable sub centimeter hypoattenuating lesion in the posterior right hepatic lobe.  In retrospect, this finding was likely present in 2012 (although it cannot be definitively identified on studies from 2010 or 2009) and is very likely benign. Recommend continued attention on routine follow-up imaging.  3.  Sludge and / or small stones in the gallbladder lumen.  4.  Atherosclerosis including multivessel coronary artery disease.  Signed,  Criselda Peaches, MD Vascular & Interventional Radiologist [facility] Radiology   Original Report Authenticated By: Jacqulynn Cadet, M.D.     Ct Angio Chest W/cm &/or Wo Cm  02/16/2012  *RADIOLOGY REPORT*  Clinical Data: Thoracic aortic aneurysm  CT ANGIOGRAPHY CHEST  Technique:  Multidetector CT imaging of the chest using the standard protocol during bolus administration of intravenous contrast. Multiplanar reconstructed images including MIPs were obtained and reviewed to evaluate the vascular anatomy.  Contrast: 131mL OMNIPAQUE IOHEXOL 350 MG/ML SOLN  Comparison: 06/24/2010  Findings: Aneurysmal dilatation of the ascending aorta has slightly increased.  Diameters at the sinus of Valsalva, sinotubular junction, and ascending aorta are 4.4 cm, 4.3 cm, and 4.6 cm.  Previously, maximal diameter in the sinus of Valsalva was 4.3 cm while the ascending aorta was 4.2 cm.  Aortic valve replacement hardware remains stable in position.  Mitral valve calcifications are noted coronary artery calcifications in the left main, left anterior descending, and circumflex arteries are noted.  Mild atherosclerotic changes at the origin of the left common carotid artery and left subclavian artery are present without significant narrowing.  Innominate artery and right subclavian artery are ectatic measuring 1.8 and 1.8 cm in caliber respectively.  Subclavian arteries are patent.  Visualized common carotid arteries are patent.  Vertebral arteries are patent.  Left vertebral is diminutive.  No obvious filling defect in the pulmonary arterial tree to suggest acute pulmonary thromboembolism.  No evidence of aortic transection or dissection.  Limited images of the abdomen demonstrate mild narrowing at the origin of the celiac axis with the appearance of median arcuate ligament syndrome.  SMA is patent.  Branch vessels of the celiac axis are grossly patent.  Atherosclerotic changes at the origin of both renal arteries is present.  Degree of narrowing is difficult to determine secondary to calcifications.  No evidence of abnormal mediastinal, hilar, or axillary adenopathy. No pericardial effusion.  No pneumothorax.  No pleural effusion.  Diffuse centrilobular emphysema is not significantly changed.  No new mass or consolidation.  Stable parenchymal scarring at the lung apices.  Bilateral renal scarring.  Adrenal glands are within normal limits. New sub centimeter low density lesion in the right lobe of the liver has developed on image 131.  No destructive bone lesion.  T12 angioma is noted.  IMPRESSION: Aneurysmal dilatation has slightly increased.  Maximal diameter of the ascending aorta is 4.6 cm and was previously 4.2 cm.  Maximal diameter at the sinus of Valsalva is 4.4 cm and was previously 4.3 cm.   New sub centimeter liver lesion in the right lobe has developed. It is benign appearing but because it is new, follow-up CT or MRI of the liver in 6 months is recommended to ensure stability.  It may have been obscured on prior studies secondary to phase of liver enhancement.   Original Report Authenticated By: Marybelle Killings, M.D.       Recent Lab Findings: Lab Results  Component Value Date   WBC 4.1 04/25/2019   HGB 14.5 04/25/2019   HCT 42.0 04/25/2019   PLT 224.0 04/25/2019   GLUCOSE 106 (H) 04/25/2019   CHOL 143 04/25/2019   TRIG 72.0 04/25/2019   HDL 55.30 04/25/2019   LDLCALC 74 04/25/2019   ALT 24 04/25/2019   AST 26 04/25/2019   NA 138 04/25/2019   K 3.8 04/25/2019   CL 100 04/25/2019   CREATININE 0.96 04/25/2019   BUN 19 04/25/2019   CO2 32 04/25/2019   TSH 1.34 04/25/2019   INR 3.2 (A) 09/18/2019   Aortic Size Index=     4.6    /Body surface area is 1.77 meters squared. =  2.51  < 2.75 cm/m2      4% risk per year 2.75 to 4.25          8% risk per year > 4.25 cm/m2    20% risk per year  cross sectional area of aorta cm2/height in meters  16.6/1.77= 9.3   Assessment / Plan:   #1 status post replacement of aortic valve with 29 mm mechanical St. Jude  In 1991 now 30 years postop follow-up for aneurysmal dilatation of the ascending thoracic aorta Stable thoracic Aortic aneurysm-No operative intervention is recommended  He continues to be diligent about his dental care and is aware of endocarditis precautions  Plan follow-up CTA 1 year-I discussed with the patient a follow-up CTA-he wished to do 1 more next year.  Grace Isaac MD  Beeper [phone] Office [phone] 10/01/2019 10:42 AM

## 2019-09-19 ENCOUNTER — Ambulatory Visit: Payer: Medicare Other | Admitting: Cardiothoracic Surgery

## 2019-09-19 ENCOUNTER — Ambulatory Visit
Admission: RE | Admit: 2019-09-19 | Discharge: 2019-09-19 | Disposition: A | Discharge status: Home / Self Care | Payer: Medicare Other | Source: Ambulatory Visit | Attending: Cardiothoracic Surgery | Admitting: Cardiothoracic Surgery

## 2019-09-19 VITALS — BP 150/74 | HR 65 | Temp 97.6°F | Resp 20 | Ht 70.0 in | Wt 140.0 lb

## 2019-09-19 DIAGNOSIS — M419 Scoliosis, unspecified: Secondary | ICD-10-CM | POA: Diagnosis not present

## 2019-09-19 DIAGNOSIS — I2510 Atherosclerotic heart disease of native coronary artery without angina pectoris: Secondary | ICD-10-CM | POA: Diagnosis not present

## 2019-09-19 DIAGNOSIS — I712 Thoracic aortic aneurysm, without rupture, unspecified: Secondary | ICD-10-CM

## 2019-09-19 DIAGNOSIS — I700 Atherosclerosis of aorta: Secondary | ICD-10-CM | POA: Diagnosis not present

## 2019-09-19 MED ORDER — IOPAMIDOL (ISOVUE-370) INJECTION 76%
75.0000 mL | Freq: Once | INTRAVENOUS | Status: AC | PRN
  Administered 2019-09-19: 75 mL via INTRAVENOUS

## 2019-09-29 ENCOUNTER — Encounter: Payer: Self-pay | Admitting: Family Medicine

## 2019-09-29 DIAGNOSIS — E785 Hyperlipidemia, unspecified: Secondary | ICD-10-CM

## 2019-09-29 DIAGNOSIS — I10 Essential (primary) hypertension: Secondary | ICD-10-CM

## 2019-09-30 ENCOUNTER — Ambulatory Visit
Admission: RE | Admit: 2019-09-30 | Discharge: 2019-09-30 | Disposition: A | Discharge status: Home / Self Care | Payer: Medicare Other | Source: Ambulatory Visit | Attending: Neurology | Admitting: Neurology

## 2019-09-30 ENCOUNTER — Other Ambulatory Visit: Payer: Self-pay

## 2019-09-30 ENCOUNTER — Telehealth: Payer: Self-pay | Admitting: Neurology

## 2019-09-30 DIAGNOSIS — G319 Degenerative disease of nervous system, unspecified: Secondary | ICD-10-CM | POA: Diagnosis not present

## 2019-09-30 DIAGNOSIS — R42 Dizziness and giddiness: Secondary | ICD-10-CM | POA: Diagnosis not present

## 2019-09-30 DIAGNOSIS — H814 Vertigo of central origin: Secondary | ICD-10-CM

## 2019-09-30 NOTE — Telephone Encounter (Signed)
Patient is needing to talk to someone about what happen to him on Friday  He started feeking dizzy and light headedness and off of balance. He does not know if this is coming from his medication Carbidopda levodopa  Please call patient

## 2019-09-30 NOTE — Telephone Encounter (Addendum)
His wife also sent a mychart message about this.  Let them know that 1 mode of communication (phone or Mychart) is perfect but they don't need to send both.  The mychart message reads as follows:   On Saturday, an hour after taking his 4 p.m. PD medication, he became dizzy, and his balance was off.  He also felt slightly nauseated.  This continues today (Monday) and he would like to talk with or see [name] today.  He has to hold onto furniture to walk as he is afraid of falling.  He can be reached at [phone], or on my (wife) cellphone [phone]. I always have my phone with me.  He is lying on the floor upstairs beside his landline, waiting for a call.  Thank you.  Please let pt know that this does not sound at all related to meds since he has been on them a long time but is quite concerning and I wish that he would have gone to the ER when it happened!  Go ahead and get a stat CT brain today and we will get him in tomorrow.   Also ask him if this is same/different than his Menieres disease symptoms in the past

## 2019-09-30 NOTE — Progress Notes (Signed)
Assessment/Plan:   1.  Parkinsons Disease  -Continue carbidopa/levodopa 25/100, 1 tablet 3 times per day  -Looks much improved since starting on this medication.  2.  Vertigo, likely BPPV  -Patient had CT brain yesterday that was stat and was normal  -pts symptoms virtually resolved  -neuro exam non focal and non lateralizing today  3.  Follow-up at previously scheduled visit.  Subjective:   David Benton was seen today in follow up for Parkinsons disease.  My previous records were reviewed prior to todays visit as well as outside records available to me. Pt seen as a work in visit today due to acute onset of dizziness.  Patient does have a history of Mnire's disease.  Patient contacted me yesterday, but symptoms started on Friday.  Patient took his 4 PM dosage of levodopa and became dizzy and somewhat lightheaded.  He has had to hold onto furniture to walk as he is afraid of falling.  I ordered a stat CT brain after he called me yesterday and I personally reviewed that.  It was nonacute.  Saw PCP this morning and felt sx's c/w vertigo and given meclizine.  Pt states that sx's better yesterday and better today than yesterday.  It is similar to when he had vertigo 15 years ago but actually not as bad.  Able to walk today "pretty much by myself now."  Has been able to eat all his meals despite the nausea a few days ago but the nausea is virtually gone.  No paresthesias.  No lateralizing weakness - just the spinning sensation.    Current prescribed movement disorder medications: Carbidopa/levodopa 25/100, 1 tablet 3 times per day (started last visit in July)   PREVIOUS MEDICATIONS: Sinemet  ALLERGIES:   Allergies  Allergen Reactions  . Codeine     REACTION: N/V    CURRENT MEDICATIONS:  Outpatient Encounter Medications as of 10/01/2019  Medication Sig  . acetaminophen (TYLENOL) 325 MG tablet Take 650 mg by mouth every 6 (six) hours as needed for pain.  Marland Kitchen atorvastatin (LIPITOR)  40 MG tablet TAKE 1 TABLET BY MOUTH EVERY DAY  . betamethasone dipropionate 0.05 % cream APPLY TO AFFECTED AREA OF SKIN UP TO 2 TIMES DAILY AS NEEDED. DO NOT APPLY TO FACE, GROIN, OR AXILLA  . carbidopa-levodopa (SINEMET IR) 25-100 MG tablet Take 1 tablet by mouth 3 (three) times daily.  Marland Kitchen levothyroxine (SYNTHROID) 75 MCG tablet Take 1 tablet (75 mcg total) by mouth daily.  . magnesium hydroxide (MILK OF MAGNESIA) 400 MG/5ML suspension Take 30 mLs by mouth daily as needed for indigestion. Reported on 04/21/2015  . meclizine (ANTIVERT) 25 MG tablet Take 1 tablet (25 mg total) by mouth every 4 (four) hours as needed for dizziness.  . Probiotic Product (PROBIOTIC-10 ULTIMATE) CAPS Take 1 capsule by mouth daily.  . sodium chloride (OCEAN) 0.65 % SOLN nasal spray Place 1 spray into both nostrils as needed for congestion. Reported on 04/21/2015  . triamcinolone ointment (KENALOG) 0.1 % SMARTSIG:1 Sparingly Topical Twice Daily PRN  . triamterene-hydrochlorothiazide (MAXZIDE-25) 37.5-25 MG tablet TAKE 1 TABLET BY MOUTH EVERY MORNING  . warfarin (COUMADIN) 5 MG tablet TAKE AS DIRECTED BY COUMADIN CLINIC FOR 30 DAYS  . amoxicillin (AMOXIL) 500 MG capsule Take 4 capsules (2,000 mg) one hour prior to all dental visits. (Patient not taking: Reported on 10/01/2019)  . prednisoLONE acetate (PRED FORTE) 1 % ophthalmic suspension Place 1 drop into the right eye 4 (four) times daily. (Patient not  taking: Reported on 10/01/2019)   No facility-administered encounter medications on file as of 10/01/2019.    Objective:   PHYSICAL EXAMINATION:    VITALS:   Vitals:   10/01/19 1325  BP: 121/71  Pulse: (!) 102  Resp: 18  SpO2: 98%  Weight: 138 lb (62.6 kg)  Height: 5\' 10"  (1.778 m)    GEN:  The patient appears stated age and is in NAD. HEENT:  Normocephalic, atraumatic.  The mucous membranes are moist. The superficial temporal arteries are without ropiness or tenderness. CV:  RRR Lungs:  CTAB Neck/HEME:  There  are no carotid bruits bilaterally.  Neurological examination:  Orientation: The patient is alert and oriented x3. Cranial nerves: There is good facial symmetry with facial hypomimia.  The extraocular muscles are intact.  There is no nystagmus.  The speech is fluent and clear. Soft palate rises symmetrically and there is no tongue deviation. Hearing is intact to conversational tone. Sensation: Sensation is intact to light touch throughout.  There is no extinction with double simultaneous stimulation. Motor: Strength is 5/5 in the upper and lower extremities.  There is no pronator drift.  Movement examination: Tone: There is normal tone in the UE/LE Abnormal movements: there is L>RUE rest tremor Coordination:  There is no decremation with RAM's, with any form of RAMS, including alternating supination and pronation of the forearm, hand opening and closing, finger taps, heel taps and toe taps. Gait and Station: The patient has no difficulty arising out of a deep-seated chair without the use of the hands. The patient's stride length is good.  He walks well in the hall with good stride length.  He is just slightly unsteady in the turn.  I have reviewed and interpreted the following labs independently    Chemistry      Component Value Date/Time   NA 138 04/25/2019 1026   NA 142 07/08/2016 1449   K 3.8 04/25/2019 1026   CL 100 04/25/2019 1026   CO2 32 04/25/2019 1026   BUN 19 04/25/2019 1026   BUN 26 07/08/2016 1449   CREATININE 0.96 04/25/2019 1026   CREATININE 1.09 07/30/2014 1400      Component Value Date/Time   CALCIUM 9.4 04/25/2019 1026   ALKPHOS 89 04/25/2019 1026   AST 26 04/25/2019 1026   ALT 24 04/25/2019 1026   BILITOT 1.9 (H) 04/25/2019 1026   BILITOT 1.7 (H) 07/08/2016 1449       Lab Results  Component Value Date   WBC 4.1 04/25/2019   HGB 14.5 04/25/2019   HCT 42.0 04/25/2019   MCV 90.9 04/25/2019   PLT 224.0 04/25/2019    Lab Results  Component Value Date    TSH 1.34 04/25/2019     Total time spent on today's visit was 45 minutes, including both face-to-face time and nonface-to-face time.  Time included that spent on review of records (prior notes available to me/labs/imaging if pertinent), discussing treatment and goals, answering patient's questions and coordinating care.  Cc:  [name], MD

## 2019-09-30 NOTE — Telephone Encounter (Signed)
Spoke with patient who states they called first because they did not have Dr Tat as an option on Mychart. He stated once they found her name they were able to send a message. Informed patient that we only need on way of communication. Mychart or telephone. Patient voiced understanding.    Patient states the symptoms are a little different than the Menieres Disease. He states he is doing a little better.   Informed patient that I would reach out to his insurance to get approval.   I will print order and take to [place] with a copy of insurance card.

## 2019-09-30 NOTE — Telephone Encounter (Signed)
Patient notified that insurance does not require a prior auth and that he should be heading to [place] soon to have CT of head completed. Patient voiced understanding.

## 2019-10-01 ENCOUNTER — Ambulatory Visit (INDEPENDENT_AMBULATORY_CARE_PROVIDER_SITE_OTHER): Payer: Medicare Other | Admitting: Family Medicine

## 2019-10-01 ENCOUNTER — Encounter: Payer: Self-pay | Admitting: Neurology

## 2019-10-01 ENCOUNTER — Ambulatory Visit: Payer: Medicare Other | Admitting: Neurology

## 2019-10-01 ENCOUNTER — Encounter: Payer: Self-pay | Admitting: Family Medicine

## 2019-10-01 VITALS — BP 121/71 | HR 102 | Resp 18 | Ht 70.0 in | Wt 138.0 lb

## 2019-10-01 VITALS — BP 140/70 | HR 68 | Temp 98.0°F | Ht 70.5 in | Wt 138.6 lb

## 2019-10-01 DIAGNOSIS — G20 Parkinson's disease: Secondary | ICD-10-CM | POA: Diagnosis not present

## 2019-10-01 DIAGNOSIS — H814 Vertigo of central origin: Secondary | ICD-10-CM

## 2019-10-01 DIAGNOSIS — R42 Dizziness and giddiness: Secondary | ICD-10-CM | POA: Diagnosis not present

## 2019-10-01 MED ORDER — MECLIZINE HCL 25 MG PO TABS: 25.0000 mg | ORAL_TABLET | ORAL | 0 refills | Status: DC | PRN

## 2019-10-01 NOTE — Progress Notes (Signed)
   Subjective:    Patient ID: David Benton, male    DOB: 01/16/1930, 86 y.o.   MRN: [id number]  HPI Here for 4 days of dizziness that makes it seem like the room is moving around him. This is much better now than at the beginning. He was nauseated at first but not now. No headache. No recent trauma. He was started on Carbidopa/Levodopa by [name] on 07-29-19 for his Parkinsons disease, and he says it has been helping. He has a hx of Meniere's disease, but he says this has not bothered him for 15 years.    Review of Systems  Constitutional: Negative.   HENT: Negative.   Eyes: Negative.   Respiratory: Negative.   Cardiovascular: Negative.   Neurological: Positive for dizziness and tremors. Negative for seizures, syncope, facial asymmetry, speech difficulty, light-headedness, numbness and headaches.       Objective:   Physical Exam Constitutional:      General: He is not in acute distress.    Appearance: Normal appearance.  Cardiovascular:     Rate and Rhythm: Normal rate and regular rhythm.     Pulses: Normal pulses.     Heart sounds: Normal heart sounds.  Pulmonary:     Effort: Pulmonary effort is normal.     Breath sounds: Normal breath sounds.  Neurological:     Mental Status: He is alert and oriented to person, place, and time.     Cranial Nerves: No cranial nerve deficit.     Comments: He walks with a slight shuffle, the left hand has a resting tremor.            Assessment & Plan:  He is getting over a bout of vertigo. He can use Meclizine as needed. I told him this is not related to the Parkinsons or to the Carbidopa/Levodopa. He will see Dr, Tat for a follow up visit later today.  Alysia Penna, MD

## 2019-10-01 NOTE — Patient Instructions (Addendum)
You look good today!  If the vertigo gets worse again, let us know and we can get you set up for a special PT just for the vertigo.    The carbidopa/levodopa 25/100 is definitely helping the rigidity!    The physicians and staff at [facility] are committed to providing excellent care. You may receive a survey requesting feedback about your experience at our office. We strive to receive "very good" responses to the survey questions. If you feel that your experience would prevent you from giving the office a "very good " response, please contact our office to try to remedy the situation. We may be reached at [phone]. Thank you for taking the time out of your busy day to complete the survey.  Your next appt with me is:  02/21/2020

## 2019-10-02 NOTE — Telephone Encounter (Signed)
-----   Message from Erskin Burnet, [facility] sent at 10/01/2019 10:54 AM EDT ----- Regarding: CCM referral Hi,  Can you please place a CCM referral for [name]? He is on my schedule next week!  Thank you, Maddie  Jeni Salles, PharmD Clinical Pharmacist [place] at [facility] [phone]

## 2019-10-04 ENCOUNTER — Telehealth: Payer: Self-pay | Admitting: Pharmacist

## 2019-10-04 NOTE — Progress Notes (Signed)
Chronic Care Management Pharmacy Assistant   Name: David Benton  MRN: [id number] DOB: 1929-08-14  Reason for Encounter:Medication Review/Initial Questions for Pharmacist visit on 10/07/2019 Patient Questions: 1. Have you seen any other providers since your last visit? Yes o Office Visit 10-01-2019  o Consult Visit -[phone], [name] 2. Any changes in your medications or health? Yes (dizziness) o meclizine (ANTIVERT) 25 MG tablet  3. Any side effects from any medications? No 4. Do you have any symptoms or problems not managed by your medications? No 5. Any concerns about your health right now? No 6. Has your provider asked that you check blood pressure, blood sugar, or follow a special diet at home? No 7. Do you get any type of exercise regularly? Yes  o He takes boxing classing twice a week and zooms cardio once a week. Also on Thursday, he likes to walk about 3-4 miles. 8. Can you think of a goal you would like to reach for your health?  o He would like to feel good. 9. Do you have any problems getting your medications? No 10. Is there anything that you would like to discuss during the appointment? No   PCP : [name], MD  Allergies:   Allergies  Allergen Reactions  . Codeine     REACTION: N/V    Medications: Outpatient Encounter Medications as of 10/04/2019  Medication Sig  . acetaminophen (TYLENOL) 325 MG tablet Take 650 mg by mouth every 6 (six) hours as needed for pain.  Marland Kitchen amoxicillin (AMOXIL) 500 MG capsule Take 4 capsules (2,000 mg) one hour prior to all dental visits. (Patient not taking: Reported on 10/01/2019)  . atorvastatin (LIPITOR) 40 MG tablet TAKE 1 TABLET BY MOUTH EVERY DAY  . betamethasone dipropionate 0.05 % cream APPLY TO AFFECTED AREA OF SKIN UP TO 2 TIMES DAILY AS NEEDED. DO NOT APPLY TO FACE, GROIN, OR AXILLA  . carbidopa-levodopa (SINEMET IR) 25-100 MG tablet Take 1 tablet by mouth 3 (three) times daily.  Marland Kitchen levothyroxine (SYNTHROID) 75 MCG  tablet Take 1 tablet (75 mcg total) by mouth daily.  . magnesium hydroxide (MILK OF MAGNESIA) 400 MG/5ML suspension Take 30 mLs by mouth daily as needed for indigestion. Reported on 04/21/2015  . meclizine (ANTIVERT) 25 MG tablet Take 1 tablet (25 mg total) by mouth every 4 (four) hours as needed for dizziness.  . prednisoLONE acetate (PRED FORTE) 1 % ophthalmic suspension Place 1 drop into the right eye 4 (four) times daily. (Patient not taking: Reported on 10/01/2019)  . Probiotic Product (PROBIOTIC-10 ULTIMATE) CAPS Take 1 capsule by mouth daily.  . sodium chloride (OCEAN) 0.65 % SOLN nasal spray Place 1 spray into both nostrils as needed for congestion. Reported on 04/21/2015  . triamcinolone ointment (KENALOG) 0.1 % SMARTSIG:1 Sparingly Topical Twice Daily PRN  . triamterene-hydrochlorothiazide (MAXZIDE-25) 37.5-25 MG tablet TAKE 1 TABLET BY MOUTH EVERY MORNING  . warfarin (COUMADIN) 5 MG tablet TAKE AS DIRECTED BY COUMADIN CLINIC FOR 30 DAYS   No facility-administered encounter medications on file as of 10/04/2019.    Current Diagnosis: Patient Active Problem List   Diagnosis Date Noted  . Hypothyroidism 02/23/2018  . Parkinson's disease ([place]) 01/26/2017  . Right inguinal hernia 11/18/2016  . Bilateral carotid artery stenosis 07/10/2016  . 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 Encounter for therapeutic drug monitoring 02/27/2013  . Head trauma 05/24/2011  . Long term (current) use of anticoagulants 09/15/2010  . S/P aortic valve replacement 05/20/2010  .  SHINGLES 11/26/2009  . Thoracic aortic aneurysm without rupture ([place]) 09/29/2008  . AORTIC VALVE REPLACEMENT, HX OF 09/29/2008  . Aortic valve disorder 09/20/2008  . HEMORRHOIDS-EXTERNAL 09/12/2008  . PERSONAL HX COLONIC POLYPS 09/12/2008  . CONSTIPATION, CHRONIC 08/04/2008  . Hyperlipidemia LDL goal <70 10/24/2007  . Essential hypertension 10/24/2007  . NEOPLASM, MALIGNANT, PROSTATE, HX OF 10/24/2007  . MENIERE'S DISEASE, HX  OF 10/24/2007    Goals Addressed   None     Follow-Up:  Pharmacist Review  Maia Breslow, [place] Assistant [phone]

## 2019-10-07 ENCOUNTER — Ambulatory Visit: Payer: Medicare Other

## 2019-10-07 ENCOUNTER — Other Ambulatory Visit: Payer: Self-pay

## 2019-10-07 DIAGNOSIS — G20 Parkinson's disease: Secondary | ICD-10-CM

## 2019-10-07 DIAGNOSIS — I10 Essential (primary) hypertension: Secondary | ICD-10-CM

## 2019-10-07 NOTE — Chronic Care Management (AMB) (Signed)
Chronic Care Management Pharmacy  Name: David Benton  MRN: [id number] DOB: 09/02/1929  Initial Planning Appointment: completed 10/04/2019  Initial Questions: 1. Have you seen any other providers since your last visit? n/a 2. Any changes in your medicines or health? Yes - started meclizine  Chief Complaint/ HPI  David Benton,  86 y.o. , male presents for their Initial CCM visit with the clinical pharmacist In office.  PCP : [name], MD  Their chronic conditions include: HTN, HLD, Parkinson's disease, hypothyroidism, constipation, vertigo  Office Visits:  10/01/19 Alysia Penna, MD: Patient presented with 4 days of dizziness and head spinning. Pt felt nauseous at first but has subsided. Prescribed meclizine for vertigo and determined it is not related to carbidopa/levodopa.   08/05/19 Alysia Penna, MD: Patient presented with pre-ulcerative calluses. Referred to podiatry.   04/25/19 Alysia Penna, MD: Patient presented for chronic care follow up. BP and Parkinson's disease has been stable. Ordered repeat lipid panel and TSH.  Consult Visit:  10/01/19 Alonza Bogus, DO (neurology):Patient presented with dizziness. Continued levodopa/carbidopa as it is helping with rigidity. Stat CT was unremarkable. Patient will return for previous follow up appt already scheduled.   09/19/19 Lanelle Bal, MD (cardiology): Patient presented for follow up for aortic valve replacement (30 years). No changes were made. Follow up in 1 year.   09/18/19 Virgel Paling, RN (cardiology): Patient presented for INR check. INR 3.2, goal 2.5-3.5. Pt taking 10 mg on Sat and 7.5 mg on all other days. Follow up in 4 weeks.   08/14/19 [name], PDM (podiatry): Patient presents with painful preulcerative callus lesion. Pt tried Dr. Buford Dresser corn pads and it made them worse. Dancers pads were put in side insoles. PRN follow up.   07/29/19 Alonza Bogus, DO (neurology):Patient presented for parkinson's disease  evaluation. Prescribed carbidopa/levodopa 25/100 three times daily at 7am/11am/4pm.    05/14/19 Cindie Crumbly (dermatology): Patient presented with tinea unguium, actinic keratosis, and skin pigmentation. Unable to access notes.   04/08/19 Marygrace Drought (ophthalmology): Patient presented with presence of intraocular lens. Unable to access notes.  Medications: Outpatient Encounter Medications as of 10/07/2019  Medication Sig   ACETAMINOPHEN PO Take 650 mg by mouth every 6 (six) hours as needed for pain.    amoxicillin (AMOXIL) 500 MG capsule Take 4 capsules (2,000 mg) one hour prior to all dental visits.   atorvastatin (LIPITOR) 40 MG tablet TAKE 1 TABLET BY MOUTH EVERY DAY   carbidopa-levodopa (SINEMET IR) 25-100 MG tablet Take 1 tablet by mouth 3 (three) times daily.   levothyroxine (SYNTHROID) 75 MCG tablet Take 1 tablet (75 mcg total) by mouth daily.   magnesium hydroxide (MILK OF MAGNESIA) 400 MG/5ML suspension Take 30 mLs by mouth daily as needed. Reported on 04/21/2015   meclizine (ANTIVERT) 25 MG tablet Take 1 tablet (25 mg total) by mouth every 4 (four) hours as needed for dizziness.   Probiotic Product (PROBIOTIC-10 ULTIMATE) CAPS Take 1 capsule by mouth daily.   saline (AYR) GEL Place 1 application into the nose as needed.   sodium chloride (OCEAN) 0.65 % SOLN nasal spray Place 1 spray into both nostrils as needed for congestion. Reported on 04/21/2015   triamcinolone ointment (KENALOG) 0.1 % SMARTSIG:1 Sparingly Topical Twice Daily PRN   triamterene-hydrochlorothiazide (MAXZIDE-25) 37.5-25 MG tablet TAKE 1 TABLET BY MOUTH EVERY MORNING   warfarin (COUMADIN) 5 MG tablet TAKE AS DIRECTED BY COUMADIN CLINIC FOR 30 DAYS   betamethasone dipropionate 0.05 % cream APPLY TO AFFECTED  AREA OF SKIN UP TO 2 TIMES DAILY AS NEEDED. DO NOT APPLY TO FACE, GROIN, OR AXILLA   prednisoLONE acetate (PRED FORTE) 1 % ophthalmic suspension Place 1 drop into the right eye 4 (four) times daily.  (Patient not taking: Reported on 10/01/2019)   No facility-administered encounter medications on file as of 10/07/2019.   Patient lives with his wife in [place] and they are both extremely active. They are both in a walking club and the patient reports he goes to boxing twice a week and has one class on zoom weekly. Patient and his wife complete 40 minutes of stretching every day.  Patient reports he has no children but is one of 8 children and only one brother is alive and living in [place]. He does have 8-9 nieces/nephews. Patient and his wife have 2 little dogs that keep them very active.  Patient reports his wife does the cooking and laundry at home and he does the vacuuming and dishwashing. Both him and his wife still drive. He also reports that they only go out to eat as a treat and it does not happen often. He reports sleeping from about 10 pm to 7 or 8 am and only gets up once or twice during the night to go to the bathroom. He reports that him and his wife stick to a sleeping schedule, which has helped.   Current Diagnosis/Assessment:  Goals Addressed            This Visit's Progress    Pharmacy Care Plan       CARE PLAN ENTRY (see longitudinal plan of care for additional care plan information)  Current Barriers:   Chronic Disease Management support, education, and care coordination needs related to Hypertension, Hyperlipidemia, Hypothyroidism, and vertigo and Parkinson's disease   Hypertension BP Readings from Last 3 Encounters:  10/01/19 121/71  10/01/19 140/70  09/19/19 (!) 150/74    Pharmacist Clinical Goal(s): o Over the next 180 days, patient will work with PharmD and providers to maintain BP goal <140/90  Current regimen:  o Triamterene/HCTZ 37.5-25 mg 1 tablet every morning  Interventions: o We discussed the importance of checking your blood pressure at home while taking a blood pressure medicine  Patient self care activities - Over the next 180 days,  patient will: o Check blood pressure weekly, document, and provide at future appointments o Ensure daily salt intake < 2300 mg/day  Hyperlipidemia Lab Results  Component Value Date/Time   LDLCALC 74 04/25/2019 10:26 AM   LDLCALC 63 07/08/2016 02:49 PM    Pharmacist Clinical Goal(s): o Over the next 180 days, patient will work with PharmD and providers to achieve LDL goal < 74  Current regimen:  o Atorvastatin 40 mg 1 tablet daily  Interventions: o We discussed limiting foods high in saturated fats and incorporating more fiber in your diet to lower bad cholesterol  Patient self care activities - Over the next 180 days, patient will: o Continue current medications  Hypothyroidism Lab Results  Component Value Date   TSH 1.34 04/25/2019    Pharmacist Clinical Goal(s): o Over the next 180 days, patient will work with PharmD and providers to maintain TSH between  0.4-4.5  Current regimen:  o Levothyroxine 75 mcg 1 tablet daily  Interventions: o We discussed taking levothyroxine on an empty stomach and separating from other medicines  Patient self care activities - Over the next 180 days, patient will: o Continue taking medication as prescribed  Aortic valve replacement/blood thinner  Pharmacist Clinical Goal(s) o Over the next 90 days, patient will work with PharmD and providers to prevent blood clots  Current regimen:  o Warfarin 10 mg on Sat and 7.5 mg on all other days or as directed by the coumadin clinic  Interventions: o We discussed monitoring for signs of bleeding such as blood in your urine or stool, nosebleeds that won't stop, or unexpected bruising  Patient self care activities - Over the next 180 days, patient will: o Continue taking medication as prescribed o Contact a healthcare provider if you notice signs of bleeding at home  Parkinson's disease  Pharmacist Clinical Goal(s) o Over the next 180 days, patient will work with PharmD and providers to  control symptoms of Parkinson's disease  Current regimen:  o Carbidopa-levodopa 25-100 mg 1 tablet 3 times daily  Interventions: o We discussed side effects of carbidopa/levodopa such as nausea o We discussed setting an alarm on your phone or alarm clock to remember to take carbidopa/levodopa at scheduled times  Patient self care activities - Over the next 180 days, patient will: o Continue taking current medicines o Establish a routine for taking carbidopa/levodopa as prescribed Vertigo  Pharmacist Clinical Goal(s) o Over the next 180 days, patient will work with PharmD and providers to minimize symptoms of vertigo  Current regimen:  o Meclizine 25 mg 1 tablet as needed for symptoms  Interventions: o We discussed ways to manage vertigo such as moving head slowly and carefully during activities, sit down right when you feel dizzy, lay in a quiet and dark room to reduce the spinning feeling o We discussed relaxation techniques (deep breathing, yoga) as sometimes vertigo can be brought on by stress/anxiety  Patient self care activities - Over the next 180 days, patient will: o Continue current management  Medication management  Pharmacist Clinical Goal(s): o Over the next 180 days, patient will work with PharmD and providers to maintain optimal medication adherence  Current pharmacy: [facility]  Interventions o Comprehensive medication review performed. o Continue current medication management strategy  Patient self care activities - Over the next 180 days, patient will: o Take medications as prescribed o Report any questions or concerns to PharmD and/or provider(s)  Initial goal documentation       SDOH Interventions     Most Recent Value  SDOH Interventions  Financial Strain Interventions Intervention Not Indicated  Transportation Interventions Intervention Not Indicated      Hypertension   BP goal is:  <140/90  Office blood pressures are  BP Readings  from Last 3 Encounters:  10/01/19 121/71  10/01/19 140/70  09/19/19 (!) 150/74   Patient checks BP at home infrequently  Patient has failed these meds in the past: none Patient is currently controlled on the following medications:   Triamterene/HCTZ 37.5-25 mg 1 tablet every morning  We discussed diet and exercise extensively and the importance of monitoring your blood pressure at home  -Diet: patient rarely uses salt, only for a bit of seasoning on baked potato or egg, -Discussed goal for sodium intake of < 1500 mg/day -Exercise: patient is very active and exercises every day  Plan  Continue current medications and control with diet and exercise     Hyperlipidemia   LDL goal < 70  Lipid Panel     Component Value Date/Time   CHOL 143 04/25/2019 1026   CHOL 136 07/08/2016 1449   TRIG 72.0 04/25/2019 1026   HDL 55.30 04/25/2019 1026   HDL 49 07/08/2016 1449  LDLCALC 74 04/25/2019 1026   LDLCALC 63 07/08/2016 1449    Hepatic Function Latest Ref Rng & Units 04/25/2019 02/26/2018 02/16/2017  Total Protein 6.0 - 8.3 g/dL 6.6 6.5 6.5  Albumin 3.5 - 5.2 g/dL 4.5 4.2 4.3  AST 0 - 37 U/L _0 ALT 0 - 53 U/L _1 Alk Phosphatase 39 - 117 U/L 89 101 89  Total Bilirubin 0.2 - 1.2 mg/dL 1.9(H) 1.9(H) 1.8(H)  Bilirubin, Direct 0.0 - 0.3 mg/dL 0.3 0.3 0.4(H)     The ASCVD Risk score Mikey Bussing DC Jr., et al., 2013) failed to calculate for the following reasons:   The 2013 ASCVD risk score is only valid for ages 61 to 68   Patient has failed these meds in past: none Patient is currently uncontrolled on the following medications:   Atorvastatin 40 mg 1 tablet daily  We discussed:  diet and exercise extensively  -Diet: we discussed limiting foods high in saturated fats such as fatty cuts of meat, full fat dairy products, processed foods (such as biscuits and pastries), and butter -We also discussed incorporating more fiber in his diet to lower cholesterol.  Plan  Continue  current medications and control with diet and exercise  Hypothyroidism   Lab Results  Component Value Date/Time   TSH 1.34 04/25/2019 10:26 AM   TSH 1.12 02/26/2018 08:51 AM   FREET4 0.92 04/25/2019 10:26 AM   FREET4 1.06 02/26/2018 08:51 AM    Patient has failed these meds in past: none Patient is currently controlled on the following medications:   Levothyroxine 75 mcg 1 tablet daily  We discussed:  administration of levothyroxine in the morning separated from other medicines and food  Plan  Continue current medications   Aortic valve replacement   Patient is currently controlled on the following medications:   Warfarin 5 mg tablet, 10 mg on Sat and 7.5 mg on all other days  We discussed: monitoring for signs of bleeding and unexpected bruising  Plan Continue current medications  Managed by cardiology.  Parkinson's disease   Patient has failed these meds in past: none Patient is currently controlled on the following medications:   Carbidopa-levodopa 25-100 mg 1 tablet 3 times daily  We discussed:  Side effects of carbidopa-levodopa and setting an alarm on phone or alarm clock to remember to take at specific times (7/11/4) specifically with 11am dose  Plan  Continue current medications  Constipation   Patient has failed these meds in past: unknown Patient is currently controlled on the following medications:   Milk of magnesia 10 mLs as needed for constipation   We discussed:  We discussed incorporating more fiber in his diet and drinking plenty of water to prevent constipation; recommended trying a stool softener or Miralax  Plan  Continue current medications   Vertigo   Patient is currently controlled on the following medications:   Meclizine 25 mg 1 tablet as needed -  Not taking right now  We discussed:   -Managing vertigo: moving head slowly and carefully during activities, sit down right when you feel dizzy, lay in a quiet and dark room to  reduce the spinning feeling -Recommended relaxation techniques (deep breathing, yoga) as sometimes vertigo can be brought on by stress/anxiety  Plan  Continue current medications  Miscellaneous    Patient is currently controlled on the following medications:   Acetaminophen 650 mg 1 tablet as needed for pain - 4 most in one day  Amoxicillin 500  mg 4 capsules prior to dental work  Probiotic 1 capsule daily  Betamethasone 0.05% cream apply to affected area up to twice daily  Saline nasal spray  Triamcinolone 0.1% ointment apply to affected area twice daily   We discussed:  We discussed the maximum dose of Tylenol of 3,000 mg/day (4 of the 650 mg tablets)  Plan  Continue current medications   Vaccines   Reviewed and discussed patient's vaccination history.    Immunization History  Administered Date(s) Administered   Fluad Quad(high Dose 65+) 10/16/2018   Influenza Split 10/09/2013, 10/08/2014, 10/10/2016   Influenza Whole 11/10/2008, 10/10/2011   Influenza, High Dose Seasonal PF 09/21/2017   Influenza-Unspecified 09/17/2012, 10/08/2014, 10/21/2015   Pneumococcal Conjugate-13 03/07/2013   Pneumococcal Polysaccharide-23 01/19/2003   Td 01/19/2003   Tdap 05/21/2013   Zoster Recombinat (Shingrix) 02/23/2018   Patient reports he got both doses of COVID vaccine in January and did get both doses of Shingrix.  Plan  Recommended patient receive influenza vaccine in office/pharmacy.   Medication Management   Pt uses [facility] for all medications Uses pill box? Yes Pt endorses 100% compliance (No gaps in dispense history (from 04/10/19 to 10/07/19)  We discussed: Current pharmacy is preferred with insurance plan and patient is satisfied with pharmacy services  Plan  Continue current medication management strategy   Follow up: 5 month phone visit  Jeni Salles, PharmD Clinical Pharmacist [place] [facility] [place] [phone]

## 2019-10-09 NOTE — Patient Instructions (Addendum)
Visit Information  Goals Addressed            This Visit's Progress   . Pharmacy Care Plan       CARE PLAN ENTRY (see longitudinal plan of care for additional care plan information)  Current Barriers:  . Chronic Disease Management support, education, and care coordination needs related to Hypertension, Hyperlipidemia, Hypothyroidism, and vertigo and Parkinson's disease   Hypertension BP Readings from Last 3 Encounters:  10/01/19 121/71  10/01/19 140/70  09/19/19 (!) 150/74   . Pharmacist Clinical Goal(s): o Over the next 180 days, patient will work with PharmD and providers to maintain BP goal <140/90 . Current regimen:  o Triamterene/HCTZ 37.5-25 mg 1 tablet every morning . Interventions: o We discussed the importance of checking your blood pressure at home while taking a blood pressure medicine . Patient self care activities - Over the next 180 days, patient will: o Check blood pressure weekly, document, and provide at future appointments o Ensure daily salt intake < 2300 mg/day  Hyperlipidemia Lab Results  Component Value Date/Time   LDLCALC 74 04/25/2019 10:26 AM   LDLCALC 63 07/08/2016 02:49 PM   . Pharmacist Clinical Goal(s): o Over the next 180 days, patient will work with PharmD and providers to achieve LDL goal < 74 . Current regimen:  o Atorvastatin 40 mg 1 tablet daily . Interventions: o We discussed limiting foods high in saturated fats and incorporating more fiber in your diet to lower bad cholesterol . Patient self care activities - Over the next 180 days, patient will: o Continue current medications  Hypothyroidism Lab Results  Component Value Date   TSH 1.34 04/25/2019   . Pharmacist Clinical Goal(s): o Over the next 180 days, patient will work with PharmD and providers to maintain TSH between  0.4-4.5 . Current regimen:  o Levothyroxine 75 mcg 1 tablet daily . Interventions: o We discussed taking levothyroxine on an empty stomach and separating  from other medicines . Patient self care activities - Over the next 180 days, patient will: o Continue taking medication as prescribed  Aortic valve replacement/blood thinner . Pharmacist Clinical Goal(s) o Over the next 90 days, patient will work with PharmD and providers to prevent blood clots . Current regimen:  o Warfarin 10 mg on Sat and 7.5 mg on all other days or as directed by the coumadin clinic . Interventions: o We discussed monitoring for signs of bleeding such as blood in your urine or stool, nosebleeds that won't stop, or unexpected bruising . Patient self care activities - Over the next 180 days, patient will: o Continue taking medication as prescribed o Contact a healthcare provider if you notice signs of bleeding at home  Parkinson's disease . Pharmacist Clinical Goal(s) o Over the next 180 days, patient will work with PharmD and providers to control symptoms of Parkinson's disease . Current regimen:  o Carbidopa-levodopa 25-100 mg 1 tablet 3 times daily . Interventions: o We discussed side effects of carbidopa/levodopa such as nausea o We discussed setting an alarm on your phone or alarm clock to remember to take carbidopa/levodopa at scheduled times . Patient self care activities - Over the next 180 days, patient will: o Continue taking current medicines o Establish a routine for taking carbidopa/levodopa as prescribed Vertigo . Pharmacist Clinical Goal(s) o Over the next 180 days, patient will work with PharmD and providers to minimize symptoms of vertigo . Current regimen:  o Meclizine 25 mg 1 tablet as needed for symptoms .  Interventions: o We discussed ways to manage vertigo such as moving head slowly and carefully during activities, sit down right when you feel dizzy, lay in a quiet and dark room to reduce the spinning feeling o We discussed relaxation techniques (deep breathing, yoga) as sometimes vertigo can be brought on by stress/anxiety . Patient self  care activities - Over the next 180 days, patient will: o Continue current management  Medication management . Pharmacist Clinical Goal(s): o Over the next 180 days, patient will work with PharmD and providers to maintain optimal medication adherence . Current pharmacy: [facility] . Interventions o Comprehensive medication review performed. o Continue current medication management strategy . Patient self care activities - Over the next 180 days, patient will: o Take medications as prescribed o Report any questions or concerns to PharmD and/or provider(s)  Initial goal documentation        David Benton was given information about Chronic Care Management services today including:  1. CCM service includes personalized support from designated clinical staff supervised by his physician, including individualized plan of care and coordination with other care providers 2. 24/7 contact phone numbers for assistance for urgent and routine care needs. 3. Standard insurance, coinsurance, copays and deductibles apply for chronic care management only during months in which we provide at least 20 minutes of these services. Most insurances cover these services at 100%, however patients may be responsible for any copay, coinsurance and/or deductible if applicable. This service may help you avoid the need for more expensive face-to-face services. 4. Only one practitioner may furnish and bill the service in a calendar month. 5. The patient may stop CCM services at any time (effective at the end of the month) by phone call to the office staff.  Patient agreed to services and verbal consent obtained.   The patient verbalized understanding of instructions provided today and agreed to receive a mailed copy of patient instruction and/or educational materials. Telephone follow up appointment with pharmacy team member scheduled for: 6 months  Jeni Salles, PharmD Clinical Pharmacist [place]  [facility] [place]   High Cholesterol  High cholesterol is a condition in which the blood has high levels of a white, waxy, fat-like substance (cholesterol). The human body needs small amounts of cholesterol. The liver makes all the cholesterol that the body needs. Extra (excess) cholesterol comes from the food that we eat. Cholesterol is carried from the liver by the blood through the blood vessels. If you have high cholesterol, deposits (plaques) may build up on the walls of your blood vessels (arteries). Plaques make the arteries narrower and stiffer. Cholesterol plaques increase your risk for heart attack and stroke. Work with your health care provider to keep your cholesterol levels in a healthy range. What increases the risk? This condition is more likely to develop in people who:  Eat foods that are high in animal fat (saturated fat) or cholesterol.  Are overweight.  Are not getting enough exercise.  Have a family history of high cholesterol. What are the signs or symptoms? There are no symptoms of this condition. How is this diagnosed? This condition may be diagnosed from the results of a blood test.  If you are older than age 81, your health care provider may check your cholesterol every 4-6 years.  You may be checked more often if you already have high cholesterol or other risk factors for heart disease. The blood test for cholesterol measures:  "Bad" cholesterol (LDL cholesterol). This is the main type  of cholesterol that causes heart disease. The desired level for LDL is less than 100.  "Good" cholesterol (HDL cholesterol). This type helps to protect against heart disease by cleaning the arteries and carrying the LDL away. The desired level for HDL is 60 or higher.  Triglycerides. These are fats that the body can store or burn for energy. The desired number for triglycerides is lower than 150.  Total cholesterol. This is a measure of the total amount of  cholesterol in your blood, including LDL cholesterol, HDL cholesterol, and triglycerides. A healthy number is less than 200. How is this treated? This condition is treated with diet changes, lifestyle changes, and medicines. Diet changes  This may include eating more whole grains, fruits, vegetables, nuts, and fish.  This may also include cutting back on red meat and foods that have a lot of added sugar. Lifestyle changes  Changes may include getting at least 40 minutes of aerobic exercise 3 times a week. Aerobic exercises include walking, biking, and swimming. Aerobic exercise along with a healthy diet can help you maintain a healthy weight.  Changes may also include quitting smoking. Medicines  Medicines are usually given if diet and lifestyle changes have failed to reduce your cholesterol to healthy levels.  Your health care provider may prescribe a statin medicine. Statin medicines have been shown to reduce cholesterol, which can reduce the risk of heart disease. Follow these instructions at home: Eating and drinking If told by your health care provider:  Eat chicken (without skin), fish, veal, shellfish, ground [place] breast, and round or loin cuts of red meat.  Do not eat fried foods or fatty meats, such as hot dogs and salami.  Eat plenty of fruits, such as apples.  Eat plenty of vegetables, such as broccoli, potatoes, and carrots.  Eat beans, peas, and lentils.  Eat grains such as barley, rice, couscous, and bulgur wheat.  Eat pasta without cream sauces.  Use skim or nonfat milk, and eat low-fat or nonfat yogurt and cheeses.  Do not eat or drink whole milk, cream, ice cream, egg yolks, or hard cheeses.  Do not eat stick margarine or tub margarines that contain trans fats (also called partially hydrogenated oils).  Do not eat saturated tropical oils, such as coconut oil and palm oil.  Do not eat cakes, cookies, crackers, or other baked goods that contain trans  fats.  General instructions  Exercise as directed by your health care provider. Increase your activity level with activities such as gardening, walking, and taking the stairs.  Take over-the-counter and prescription medicines only as told by your health care provider.  Do not use any products that contain nicotine or tobacco, such as cigarettes and e-cigarettes. If you need help quitting, ask your health care provider.  Keep all follow-up visits as told by your health care provider. This is important. Contact a health care provider if:  You are struggling to maintain a healthy diet or weight.  You need help to start on an exercise program.  You need help to stop smoking. Get help right away if:  You have chest pain.  You have trouble breathing. This information is not intended to replace advice given to you by your health care provider. Make sure you discuss any questions you have with your health care provider. Document Revised: 01/06/2017 Document Reviewed: 07/04/2015 Elsevier Patient Education  [place].

## 2019-10-10 NOTE — Progress Notes (Signed)
Cardiology Office Note:    Date:  10/11/2019   ID:  [name], DOB 1929-10-28, MRN [id number]  PCP:  [name], MD  [facility] Cardiologist:  Sherren Mocha, MD   [facility] Electrophysiologist:  None   Referring MD: [name], MD   Chief Complaint:  Follow-up (Aortic valve disease)    Patient Profile:    David Benton is a 86 y.o. male with:   Bicuspid Aortic Valve Stenosis ? S/p St Jude Mechanical AVR in 1991 ? No longer on ASA due to hx of spontaneous forearm hematoma   Thoracic Aortic Aneurysm  ? Followed by [name] ? CT 08/2018: 4.6 x 4.5 cm ? CT 9/21: 4.6x4.5cm  Carotid Artery Dz   Hypertension   Hyperlipidemia   Parkinson's Disease  Aortic atherosclerosis   Prostate CA  Meniere's Dz  Prior CV studies: Chest CTA 09/19/19 IMPRESSION: 1. Stable appearance of ascending thoracic aortic aneurysm measuring 4.6 x 4.5 cm. Ascending thoracic aortic aneurysm. Recommend semi-annual imaging followup by CTA or MRA and referral to cardiothoracic surgery if not already obtained. This recommendation follows 2010 ACCF/AHA/AATS/ACR/ASA/SCA/SCAI/SIR/STS/SVM Guidelines for the Diagnosis and Management of Patients With Thoracic Aortic Disease. Circulation. 2010; 121: V672-C947. Aortic aneurysm NOS (ICD10-I71.9) 2. Small nonspecific pulmonary nodules are unchanged from previous exam. These both measure less than 5 mm and are compatible with a benign process. 3. Coronary artery calcifications.  Aortic Atherosclerosis (ICD10-I70.0).  Carotid US 10/29/2018 Bilat ICA 1-39; dilated R subclavian (1.7 cm)  Carotid US 04/11/17 Bilat ICA 1-39 Dilated R subclavian artery (1.7 x 1.8 cm)  Echocardiogram 02/09/2016 Mild LVH, severe focal basal septal hypertrophy, EF 60-65, no RWMA, Gr 1 DD, mechanical AVR w/ mean gradient 7, ascending aorta 45 mm (mildly dilated), severe LAE, aneurysmal IAS, mild TR, PASP 37   History of Present Illness:    [name] was previously followed by [name].  He was last seen in 09/2018.  He returns for follow up.  He is here alone.  He has been doing well.  He does boxing exercises twice a week and walks on a daily basis.  He also volunteers at [facility] once a week.  He has not had chest discomfort, shortness of breath, syncope, orthopnea, lower extremity swelling.      Past Medical History:  Diagnosis Date  . AVD (aortic valve disease) 02/25/2010   sees [name]   . Bilateral carotid artery stenosis 07/10/2016   Carotid US 10/2018: Bilateral ICA 1-39; right subclavian 1.7 cm (no change since 03/2017)  . External hemorrhoids without mention of complication   . Heart valve replaced by other means   . Long term (current) use of anticoagulants 09/15/2010  . Meniere's disease   . Other and unspecified hyperlipidemia   . Other constipation   . Parkinson disease ([place]) 02/2016   no meds, sees [name]   . Personal history of colonic polyps   . Personal history of malignant neoplasm of prostate   . Right inguinal hernia 11/18/2016  . Shingles   . SHINGLES 11/26/2009  . Thoracic aneurysm without mention of rupture    followed by [name] with yearly CT scans   . Unspecified essential hypertension     Current Medications: Current Meds  Medication Sig  . ACETAMINOPHEN PO Take 650 mg by mouth every 6 (six) hours as needed for pain.   Marland Kitchen amoxicillin (AMOXIL) 500 MG capsule Take 4 capsules (2,000 mg) one hour prior to  all dental visits.  Marland Kitchen atorvastatin (LIPITOR) 40 MG tablet TAKE 1 TABLET BY MOUTH EVERY DAY  . betamethasone dipropionate 0.05 % cream APPLY TO AFFECTED AREA OF SKIN UP TO 2 TIMES DAILY AS NEEDED. DO NOT APPLY TO FACE, GROIN, OR AXILLA  . carbidopa-levodopa (SINEMET IR) 25-100 MG tablet Take 1 tablet by mouth 3 (three) times daily.  Marland Kitchen levothyroxine (SYNTHROID) 75 MCG tablet Take 1 tablet (75 mcg total) by mouth daily.  . magnesium hydroxide (MILK OF MAGNESIA) 400 MG/5ML suspension  Take 30 mLs by mouth daily as needed. Reported on 04/21/2015  . meclizine (ANTIVERT) 25 MG tablet Take 1 tablet (25 mg total) by mouth every 4 (four) hours as needed for dizziness.  . Probiotic Product (PROBIOTIC-10 ULTIMATE) CAPS Take 1 capsule by mouth daily.  . saline (AYR) GEL Place 1 application into the nose in the morning and at bedtime.   . sodium chloride (OCEAN) 0.65 % SOLN nasal spray Place 1 spray into both nostrils as needed for congestion. Reported on 04/21/2015  . triamterene-hydrochlorothiazide (MAXZIDE-25) 37.5-25 MG tablet TAKE 1 TABLET BY MOUTH EVERY MORNING  . warfarin (COUMADIN) 5 MG tablet TAKE AS DIRECTED BY COUMADIN CLINIC FOR 30 DAYS     Allergies:   Codeine   Social History   Tobacco Use  . Smoking status: Former Smoker    Packs/day: 0.50    Years: 10.00    Pack years: 5.00    Types: Cigarettes    Quit date: 02/06/1972    Years since quitting: 47.7  . Smokeless tobacco: Never Used  Vaping Use  . Vaping Use: Never used  Substance Use Topics  . Alcohol use: Yes    Alcohol/week: 7.0 standard drinks    Types: 7 Standard drinks or equivalent per week    Comment: glass of wine daily; just about   . Drug use: No     Family Hx: The patient's family history includes Breast cancer in his sister; CAD in his father; Diabetes in his brother and brother; Heart attack in his mother; Heart attack (age of onset: 50) in his father; Heart disease in his brother; Hypertension in his brother and father; Other in his father; Prostate cancer in his brother; Stroke in his brother. There is no history of Colon cancer.  Review of Systems  Gastrointestinal: Negative for hematochezia and melena.  Genitourinary: Negative for hematuria.     EKGs/Labs/Other Test Reviewed:    EKG:  EKG is   ordered today.  The ekg ordered today demonstrates normal sinus rhythm, rate 65, left axis deviation, first-degree AV block, PR interval 230 ms, septal Q waves, nonspecific ST-T wave changes, QTC  472, no change from prior tracing  Recent Labs: 04/25/2019: ALT 24; BUN 19; Creatinine, Ser 0.96; Hemoglobin 14.5; Platelets 224.0; Potassium 3.8; Sodium 138; TSH 1.34   Recent Lipid Panel Lab Results  Component Value Date/Time   CHOL 143 04/25/2019 10:26 AM   CHOL 136 07/08/2016 02:49 PM   TRIG 72.0 04/25/2019 10:26 AM   HDL 55.30 04/25/2019 10:26 AM   HDL 49 07/08/2016 02:49 PM   CHOLHDL 3 04/25/2019 10:26 AM   LDLCALC 74 04/25/2019 10:26 AM   LDLCALC 63 07/08/2016 02:49 PM    Physical Exam:    VS:  BP 110/60   Pulse 65   Ht 5\' 10"  (1.778 m)   Wt 139 lb (63 kg)   SpO2 95%   BMI 19.94 kg/m     Wt Readings from Last 3 Encounters:  10/11/19 139 lb (63 kg)  10/01/19 138 lb (62.6 kg)  10/01/19 138 lb 9.6 oz (62.9 kg)     Constitutional:      Appearance: Healthy appearance. Not in distress.  Neck:     Vascular: JVD normal.  Pulmonary:     Effort: Pulmonary effort is normal.     Breath sounds: No wheezing. No rales.  Cardiovascular:     Normal rate. Regular rhythm. Normal S1. S2. Mechanical S2      Murmurs: There is a grade 2/6 systolic murmur at the URSB.  Edema:    Peripheral edema absent.  Abdominal:     Palpations: Abdomen is soft.  Skin:    General: Skin is warm and dry.  Neurological:     General: No focal deficit present.     Mental Status: Alert and oriented to person, place and time.     Cranial Nerves: Cranial nerves are intact.     Motor: Tremor present.      ASSESSMENT & PLAN:    1. Aortic valve disorder 2. S/P aortic valve replacement S/p mechanical aortic valve replacement in 1991.  His echocardiogram demonstrated stable gradients across his mechanical aortic valve in 2018.  He could not tolerate aspirin in addition to Coumadin.  He follows up in our Coumadin clinic.  Continue SBE prophylaxis.  Follow-up in 1 year.  3. Thoracic aortic aneurysm without rupture [facility]) He is followed by [name] with serial CT scans.  Most recent measurement was  stable at 4.6 cm.  Blood pressure is well controlled.  4. Bilateral carotid artery stenosis Mild bilateral carotid plaque.  He has a dilated right subclavian.  Repeat ultrasound is planned for 10/2019.  5. Essential hypertension The patient's blood pressure is controlled on his current regimen.  Continue current therapy.   6. Hyperlipidemia LDL goal <70 LDL optimal on most recent lab work.  Continue current Rx.    Dispo:  Return in about 1 year (around 10/10/2020) for Routine Follow Up, w/ Richardson Dopp, PA-C, in person.   Medication Adjustments/Labs and Tests Ordered: Current medicines are reviewed at length with the patient today.  Concerns regarding medicines are outlined above.  Tests Ordered: Orders Placed This Encounter  Procedures  . EKG 12-Lead   Medication Changes: No orders of the defined types were placed in this encounter.   Signed, Richardson Dopp, PA-C  10/11/2019 11:26 AM    [facility] [place], [place], [place]  [address] Phone: [phone]; Fax: [phone]

## 2019-10-11 ENCOUNTER — Other Ambulatory Visit: Payer: Self-pay

## 2019-10-11 ENCOUNTER — Ambulatory Visit: Payer: Medicare Other | Admitting: Physician Assistant

## 2019-10-11 ENCOUNTER — Encounter: Payer: Self-pay | Admitting: Physician Assistant

## 2019-10-11 ENCOUNTER — Ambulatory Visit: Payer: Medicare Other

## 2019-10-11 VITALS — BP 110/60 | HR 65 | Ht 70.0 in | Wt 139.0 lb

## 2019-10-11 DIAGNOSIS — I6523 Occlusion and stenosis of bilateral carotid arteries: Secondary | ICD-10-CM

## 2019-10-11 DIAGNOSIS — I359 Nonrheumatic aortic valve disorder, unspecified: Secondary | ICD-10-CM | POA: Diagnosis not present

## 2019-10-11 DIAGNOSIS — I10 Essential (primary) hypertension: Secondary | ICD-10-CM

## 2019-10-11 DIAGNOSIS — I712 Thoracic aortic aneurysm, without rupture, unspecified: Secondary | ICD-10-CM

## 2019-10-11 DIAGNOSIS — Z952 Presence of prosthetic heart valve: Secondary | ICD-10-CM | POA: Diagnosis not present

## 2019-10-11 DIAGNOSIS — Z5181 Encounter for therapeutic drug level monitoring: Secondary | ICD-10-CM | POA: Diagnosis not present

## 2019-10-11 DIAGNOSIS — E785 Hyperlipidemia, unspecified: Secondary | ICD-10-CM

## 2019-10-11 LAB — POCT INR: INR: 3.1 — AB (ref 2.0–3.0)

## 2019-10-11 NOTE — Patient Instructions (Signed)
Description   Continue on same dosage 1.5 tablets every day except 2 tablets on Saturdays. Coumadin Clinic # [phone]. Call with any concerns  Recheck INR in 5 weeks.

## 2019-10-11 NOTE — Patient Instructions (Signed)
Medication Instructions:  Your physician recommends that you continue on your current medications as directed. Please refer to the Current Medication list given to you today.  *If you need a refill on your cardiac medications before your next appointment, please call your pharmacy*  Lab Work: None ordered today  Testing/Procedures: None ordered today  Follow-Up: At [facility], you and your health needs are our priority.  As part of our continuing mission to provide you with exceptional heart care, we have created designated Provider Care Teams.  These Care Teams include your primary Cardiologist (physician) and Advanced Practice Providers (APPs -  Physician Assistants and Nurse Practitioners) who all work together to provide you with the care you need, when you need it.  Your next appointment:   12 month(s)  The format for your next appointment:   In Person  Provider:   Scott Weaver, PA-C    

## 2019-10-16 ENCOUNTER — Other Ambulatory Visit: Payer: Self-pay | Admitting: Cardiovascular Disease

## 2019-10-16 NOTE — Telephone Encounter (Signed)
Refill request

## 2019-10-17 ENCOUNTER — Ambulatory Visit (INDEPENDENT_AMBULATORY_CARE_PROVIDER_SITE_OTHER): Payer: Medicare Other | Admitting: Podiatry

## 2019-10-17 ENCOUNTER — Other Ambulatory Visit: Payer: Self-pay

## 2019-10-17 DIAGNOSIS — M7741 Metatarsalgia, right foot: Secondary | ICD-10-CM

## 2019-10-17 DIAGNOSIS — L84 Corns and callosities: Secondary | ICD-10-CM

## 2019-10-17 DIAGNOSIS — M21969 Unspecified acquired deformity of unspecified lower leg: Secondary | ICD-10-CM

## 2019-10-17 DIAGNOSIS — M7742 Metatarsalgia, left foot: Secondary | ICD-10-CM | POA: Diagnosis not present

## 2019-10-17 DIAGNOSIS — M79671 Pain in right foot: Secondary | ICD-10-CM | POA: Diagnosis not present

## 2019-10-17 DIAGNOSIS — M79672 Pain in left foot: Secondary | ICD-10-CM

## 2019-10-17 NOTE — Progress Notes (Signed)
  Subjective:  Patient ID: David Benton, male    DOB: Apr 08, 1929,  MRN: [id number]  Chief Complaint  Patient presents with  . Callouses    2 month follow up preulcerative callus, at risk foot care    86 y.o. male returns with the above complaint. History confirmed with patient.  The pad has been helping quite a bit.  He has been putting lotion on them.  Says he is not consistent with this.  Objective:  Physical Exam: warm, good capillary refill, no trophic changes or ulcerative lesions, normal DP and PT pulses and normal sensory exam. Left Foot: Submet 5 large hyperkeratosis Right Foot: Submet 5 and submet 1 large hyperkeratoses, the one under the fifth has some petechial bleeding deep to it, no ulceration    Assessment:   No diagnosis found.   Plan:  Patient was evaluated and treated and all questions answered.  All symptomatic hyperkeratoses were safely debrided with a sterile #15 blade to patient's level of comfort without incident. We discussed preventative and palliative care of these lesions including supportive and accommodative shoegear, padding, prefabricated and custom molded accommodative orthoses, use of a pumice stone and lotions/creams daily.  Discussed the reason why he develops any calluses due to his foot type, metatarsalgia, and metatarsal deformities.  Further dancers pads were dispensed.  Recommend he use urea cream 40%.  [name], DPM 10/17/2019    Return in about 2 months (around 12/17/2019).

## 2019-10-17 NOTE — Patient Instructions (Signed)
Look for urea 40% cream or ointment and apply to the thickened dry skin / calluses. This can be bought over the counter, at a pharmacy or online such as [employer].  

## 2019-10-28 ENCOUNTER — Ambulatory Visit (HOSPITAL_COMMUNITY)
Admission: RE | Admit: 2019-10-28 | Discharge: 2019-10-28 | Disposition: A | Discharge status: Home / Self Care | Payer: Medicare Other | Source: Ambulatory Visit | Attending: Cardiology | Admitting: Cardiology

## 2019-10-28 ENCOUNTER — Other Ambulatory Visit: Payer: Self-pay

## 2019-10-28 DIAGNOSIS — I6523 Occlusion and stenosis of bilateral carotid arteries: Secondary | ICD-10-CM | POA: Insufficient documentation

## 2019-10-30 ENCOUNTER — Encounter (HOSPITAL_COMMUNITY): Payer: Medicare Other

## 2019-10-31 ENCOUNTER — Other Ambulatory Visit: Payer: Self-pay | Admitting: Neurology

## 2019-11-06 ENCOUNTER — Encounter: Payer: Self-pay | Admitting: Physician Assistant

## 2019-11-07 ENCOUNTER — Telehealth: Payer: Self-pay

## 2019-11-07 DIAGNOSIS — I6523 Occlusion and stenosis of bilateral carotid arteries: Secondary | ICD-10-CM

## 2019-11-07 NOTE — Telephone Encounter (Signed)
Attempted to call Pt. Unable to leave message.  Sent mychart message with results.

## 2019-11-07 NOTE — Telephone Encounter (Signed)
-----   Message from Liliane Shi, [place] sent at 11/06/2019  9:35 PM EDT ----- Please call patient. The carotid US shows mild bilateral carotid artery plaque.   PLAN:  - Continue current medications/treatment plan and follow up as scheduled. - Repeat US in 1 year Richardson Dopp, [place]    11/06/2019 9:30 PM

## 2019-11-12 ENCOUNTER — Ambulatory Visit: Payer: Medicare Other | Admitting: Pharmacist

## 2019-11-12 ENCOUNTER — Other Ambulatory Visit: Payer: Self-pay

## 2019-11-12 DIAGNOSIS — Z5181 Encounter for therapeutic drug level monitoring: Secondary | ICD-10-CM

## 2019-11-12 DIAGNOSIS — Z952 Presence of prosthetic heart valve: Secondary | ICD-10-CM

## 2019-11-12 DIAGNOSIS — Z7901 Long term (current) use of anticoagulants: Secondary | ICD-10-CM | POA: Diagnosis not present

## 2019-11-12 LAB — POCT INR: INR: 3.7 — AB (ref 2.0–3.0)

## 2019-11-12 NOTE — Patient Instructions (Addendum)
Description   Just take 1 tablet today (5mg ) and then continue on same dosage 1.5 tablets every day except 2 tablets on Saturdays. Coumadin Clinic # [phone]. Call with any concerns  Recheck INR in 3 weeks.

## 2019-11-14 DIAGNOSIS — L905 Scar conditions and fibrosis of skin: Secondary | ICD-10-CM | POA: Diagnosis not present

## 2019-11-14 DIAGNOSIS — Z85828 Personal history of other malignant neoplasm of skin: Secondary | ICD-10-CM | POA: Diagnosis not present

## 2019-11-14 DIAGNOSIS — L578 Other skin changes due to chronic exposure to nonionizing radiation: Secondary | ICD-10-CM | POA: Diagnosis not present

## 2019-11-14 DIAGNOSIS — L570 Actinic keratosis: Secondary | ICD-10-CM | POA: Diagnosis not present

## 2019-12-03 ENCOUNTER — Ambulatory Visit: Payer: Medicare Other | Admitting: Neurology

## 2019-12-04 ENCOUNTER — Ambulatory Visit: Payer: Medicare Other | Admitting: *Deleted

## 2019-12-04 ENCOUNTER — Other Ambulatory Visit: Payer: Self-pay

## 2019-12-04 DIAGNOSIS — Z5181 Encounter for therapeutic drug level monitoring: Secondary | ICD-10-CM

## 2019-12-04 LAB — POCT INR: INR: 3.5 — AB (ref 2.0–3.0)

## 2019-12-04 NOTE — Patient Instructions (Signed)
Description   Continue taking 1.5 tablets every day except 2 tablets on Saturdays. Coumadin Clinic # [phone]. Call with any concerns  Recheck INR in 4 weeks.

## 2019-12-19 ENCOUNTER — Other Ambulatory Visit: Payer: Self-pay | Admitting: Internal Medicine

## 2019-12-20 ENCOUNTER — Other Ambulatory Visit: Payer: Self-pay

## 2019-12-20 ENCOUNTER — Ambulatory Visit: Payer: Medicare Other | Admitting: Podiatry

## 2019-12-20 DIAGNOSIS — M7741 Metatarsalgia, right foot: Secondary | ICD-10-CM

## 2019-12-20 DIAGNOSIS — L84 Corns and callosities: Secondary | ICD-10-CM

## 2019-12-20 DIAGNOSIS — M7742 Metatarsalgia, left foot: Secondary | ICD-10-CM

## 2019-12-20 DIAGNOSIS — M21969 Unspecified acquired deformity of unspecified lower leg: Secondary | ICD-10-CM | POA: Diagnosis not present

## 2019-12-24 NOTE — Progress Notes (Signed)
  Subjective:  Patient ID: David Benton, male    DOB: 07/01/1929,  MRN: [id number]  Chief Complaint  Patient presents with  . Callouses    Right foot callus.     86 y.o. male returns with the above complaint. History confirmed with patient.  He is doing quite well with the urea cream and padding.  On the right foot submet 5 Hz now  Objective:  Physical Exam: warm, good capillary refill, no trophic changes or ulcerative lesions, normal DP and PT pulses and normal sensory exam. Left Foot: Submet 5 large hyperkeratosis, improved since last visit in character and quality Right Foot: Submet 5 and submet 1 large hyperkeratoses, both improved and softer than previously   Assessment:   No diagnosis found.   Plan:  Patient was evaluated and treated and all questions answered.  All symptomatic hyperkeratoses were safely debrided with a sterile #15 blade to patient's level of comfort without incident. We discussed preventative and palliative care of these lesions including supportive and accommodative shoegear, padding, prefabricated and custom molded accommodative orthoses, use of a pumice stone and lotions/creams daily.  Continue use of dancers pads and urea cream 40%  [name], DPM 12/24/2019    Return in about 2 months (around 02/20/2020).

## 2020-01-01 ENCOUNTER — Other Ambulatory Visit: Payer: Self-pay

## 2020-01-01 ENCOUNTER — Ambulatory Visit: Payer: Medicare Other | Admitting: *Deleted

## 2020-01-01 DIAGNOSIS — Z5181 Encounter for therapeutic drug level monitoring: Secondary | ICD-10-CM

## 2020-01-01 LAB — POCT INR: INR: 2.6 (ref 2.0–3.0)

## 2020-01-01 NOTE — Patient Instructions (Signed)
Description   Continue taking Warfarin 1.5 tablets every day except 2 tablets on Saturdays. [facility] # [phone]. Call with any concerns  Recheck INR in 5 weeks.      

## 2020-01-02 DIAGNOSIS — L578 Other skin changes due to chronic exposure to nonionizing radiation: Secondary | ICD-10-CM | POA: Diagnosis not present

## 2020-01-02 DIAGNOSIS — L905 Scar conditions and fibrosis of skin: Secondary | ICD-10-CM | POA: Diagnosis not present

## 2020-01-02 DIAGNOSIS — Z85828 Personal history of other malignant neoplasm of skin: Secondary | ICD-10-CM | POA: Diagnosis not present

## 2020-01-28 ENCOUNTER — Other Ambulatory Visit: Payer: Self-pay | Admitting: Neurology

## 2020-01-31 ENCOUNTER — Ambulatory Visit: Payer: Medicare Other | Admitting: Neurology

## 2020-02-05 ENCOUNTER — Ambulatory Visit: Payer: Medicare Other | Admitting: *Deleted

## 2020-02-05 ENCOUNTER — Other Ambulatory Visit: Payer: Self-pay

## 2020-02-05 DIAGNOSIS — Z5181 Encounter for therapeutic drug level monitoring: Secondary | ICD-10-CM | POA: Diagnosis not present

## 2020-02-05 LAB — POCT INR: INR: 2.7 (ref 2.0–3.0)

## 2020-02-05 NOTE — Patient Instructions (Signed)
Description   Continue taking Warfarin 1.5 tablets every day except 2 tablets on Saturdays. [facility] # [phone]. Call with any concerns  Recheck INR in 6 weeks.       

## 2020-02-19 NOTE — Progress Notes (Unsigned)
Assessment/Plan:   1.  Parkinsons Disease  -Continue carbidopa/levodopa 25/100, 1 tablet 3 times per day.  I thought about increasing it d/t RUE rigidity but he is really functioning well so we decided to hold on that for now  -congratulated him on all the exercise  -last derm visit was few months ago.  We discussed that it used to be thought that levodopa would increase risk of melanoma but now it is believed that Parkinsons itself likely increases risk of melanoma. he is to get regular skin checks.  2.  History of aortic valve replacement  -On Coumadin.  Follows with cardiology Subjective:   David Benton was seen today in follow up for Parkinsons disease.  My previous records were reviewed prior to todays visit as well as outside records available to me. Pt denies falls.  Pt denies lightheadedness, near syncope.  No hallucinations.  Mood has been good.  He is doing RSB and doing a strength class as well.   Saw cardiology PA on September 24.  Notes reviewed.  Current prescribed movement disorder medications: Carbidopa/levodopa 25/100, 1 tablet 3 times daily.    ALLERGIES:   Allergies  Allergen Reactions  . Codeine     REACTION: N/V    CURRENT MEDICATIONS:  Outpatient Encounter Medications as of 02/21/2020  Medication Sig  . ACETAMINOPHEN PO Take 650 mg by mouth every 6 (six) hours as needed for pain.   Marland Kitchen amoxicillin (AMOXIL) 500 MG capsule Take 4 capsules (2,000 mg) one hour prior to all dental visits.  Marland Kitchen atorvastatin (LIPITOR) 40 MG tablet TAKE 1 TABLET BY MOUTH EVERY DAY  . betamethasone dipropionate 0.05 % cream APPLY TO AFFECTED AREA OF SKIN UP TO 2 TIMES DAILY AS NEEDED. DO NOT APPLY TO FACE, GROIN, OR AXILLA  . carbidopa-levodopa (SINEMET IR) 25-100 MG tablet TAKE 1 TABLET BY MOUTH 3 TIMES DAILY  . levothyroxine (SYNTHROID) 75 MCG tablet Take 1 tablet (75 mcg total) by mouth daily.  . magnesium hydroxide (MILK OF MAGNESIA) 400 MG/5ML suspension Take 30 mLs by mouth  daily as needed. Reported on 04/21/2015  . meclizine (ANTIVERT) 25 MG tablet Take 1 tablet (25 mg total) by mouth every 4 (four) hours as needed for dizziness.  . Probiotic Product (PROBIOTIC-10 ULTIMATE) CAPS Take 1 capsule by mouth daily.  . saline (AYR) GEL Place 1 application into the nose in the morning and at bedtime.   . sodium chloride (OCEAN) 0.65 % SOLN nasal spray Place 1 spray into both nostrils as needed for congestion. Reported on 04/21/2015  . TOLAK 4 % CREA Apply 1 a small amount to affected area once a day  Apply to scalp and arms    RX 2 Tubes  . triamterene-hydrochlorothiazide (MAXZIDE-25) 37.5-25 MG tablet TAKE 1 TABLET BY MOUTH EVERY MORNING  . warfarin (COUMADIN) 5 MG tablet TAKE AS DIRECTED BY COUMADIN CLINIC   No facility-administered encounter medications on file as of 02/21/2020.    Objective:   PHYSICAL EXAMINATION:    VITALS:   Vitals:   02/21/20 0803  BP: 120/82  Pulse: 74  Resp: 18  SpO2: 99%  Weight: 141 lb (64 kg)  Height: 5\' 10"  (1.778 m)    GEN:  The patient appears stated age and is in NAD. HEENT:  Normocephalic, atraumatic.  The mucous membranes are moist. The superficial temporal arteries are without ropiness or tenderness. CV:  RRR Lungs:  CTAB Neck/HEME:  There are no carotid bruits bilaterally.  Neurological examination:  Orientation: The patient is alert and oriented x3. Cranial nerves: There is good facial symmetry with facial hypomimia. The speech is fluent and clear. Soft palate rises symmetrically and there is no tongue deviation. Hearing is intact to conversational tone. Sensation: Sensation is intact to light touch throughout Motor: Strength is at least antigravity x4.  Movement examination: Tone: There is mod increased tone in the RUE Abnormal movements: there is LUE>RUE rest tremor Coordination:  There is no decremation with RAM's, with any form of RAMS, including alternating supination and pronation of the forearm, hand opening  and closing, finger taps, heel taps and toe taps. Gait and Station: The patient has no difficulty arising out of a deep-seated chair without the use of the hands. The patient's stride length is good.   I have reviewed and interpreted the following labs independently    Chemistry      Component Value Date/Time   NA 138 04/25/2019 1026   NA 142 07/08/2016 1449   K 3.8 04/25/2019 1026   CL 100 04/25/2019 1026   CO2 32 04/25/2019 1026   BUN 19 04/25/2019 1026   BUN 26 07/08/2016 1449   CREATININE 0.96 04/25/2019 1026   CREATININE 1.09 07/30/2014 1400      Component Value Date/Time   CALCIUM 9.4 04/25/2019 1026   ALKPHOS 89 04/25/2019 1026   AST 26 04/25/2019 1026   ALT 24 04/25/2019 1026   BILITOT 1.9 (H) 04/25/2019 1026   BILITOT 1.7 (H) 07/08/2016 1449       Lab Results  Component Value Date   WBC 4.1 04/25/2019   HGB 14.5 04/25/2019   HCT 42.0 04/25/2019   MCV 90.9 04/25/2019   PLT 224.0 04/25/2019    Lab Results  Component Value Date   TSH 1.34 04/25/2019     Total time spent on today's visit was 20 minutes, including both face-to-face time and nonface-to-face time.  Time included that spent on review of records (prior notes available to me/labs/imaging if pertinent), discussing treatment and goals, answering patient's questions and coordinating care.  Cc:  [name], MD

## 2020-02-20 ENCOUNTER — Ambulatory Visit (INDEPENDENT_AMBULATORY_CARE_PROVIDER_SITE_OTHER): Payer: Medicare Other | Admitting: Podiatry

## 2020-02-20 ENCOUNTER — Other Ambulatory Visit: Payer: Self-pay

## 2020-02-20 DIAGNOSIS — M7741 Metatarsalgia, right foot: Secondary | ICD-10-CM

## 2020-02-20 DIAGNOSIS — M21969 Unspecified acquired deformity of unspecified lower leg: Secondary | ICD-10-CM

## 2020-02-20 DIAGNOSIS — M7742 Metatarsalgia, left foot: Secondary | ICD-10-CM | POA: Diagnosis not present

## 2020-02-20 DIAGNOSIS — L84 Corns and callosities: Secondary | ICD-10-CM

## 2020-02-20 DIAGNOSIS — R52 Pain, unspecified: Secondary | ICD-10-CM

## 2020-02-21 ENCOUNTER — Ambulatory Visit: Payer: Medicare Other | Admitting: Neurology

## 2020-02-21 ENCOUNTER — Encounter: Payer: Self-pay | Admitting: Neurology

## 2020-02-21 VITALS — BP 120/82 | HR 74 | Resp 18 | Ht 70.0 in | Wt 141.0 lb

## 2020-02-21 DIAGNOSIS — G20 Parkinson's disease: Secondary | ICD-10-CM

## 2020-02-21 NOTE — Progress Notes (Signed)
  Subjective:  Patient ID: YUKI PURVES, male    DOB: 05-10-1929,  MRN: [id number]  Chief Complaint  Patient presents with  . routine foot care    Callus and nail trim     87 y.o. male returns with the above complaint. History confirmed with patient.  He is doing quite well with the urea cream and padding.  On the right foot submet 5 B/L now and the hallux  Objective:  Physical Exam: warm, good capillary refill, no trophic changes or ulcerative lesions, normal DP and PT pulses and normal sensory exam. Left Foot: Submet 5 large hyperkeratosis, improved since last visit in character and quality Right Foot: Submet 5 and submet 1 large hyperkeratoses, both improved and softer than previously   Assessment:   No diagnosis found.   Plan:  Patient was evaluated and treated and all questions answered.  All symptomatic hyperkeratoses were safely debrided with a sterile #15 blade to patient's level of comfort without incident. We discussed preventative and palliative care of these lesions including supportive and accommodative shoegear, padding, prefabricated and custom molded accommodative orthoses, use of a pumice stone and lotions/creams daily.  Continue use of dancers pads and urea cream 40%  [name], DPM 02/21/2020    No follow-ups on file.

## 2020-02-21 NOTE — Patient Instructions (Signed)
Online Resources for Power over Parkinson's Group January 2022  . Local [place] Online Groups  o Power over Parkinson's Group :   - Power Over Parkinson's Patient Education Group will be Wednesday, January 12th at 2pm via Zoom.   - Upcoming Power over Parkinson's Meetings:  2nd Wednesdays of the month at 2 pm:       February 9th, March 9th - Contact Amy Marriott at [email or web address] if interested in participating in this online group o Parkinson's Care Partners Group:    3rd Mondays, Contact Sarah Chambers o Atypical Parkinsonian Patient Group:   4th Wednesdays, Contact Sarah Chambers o If you are interested in participating in these online groups with Sarah, please contact her directly for how to join those meetings.  Her contact information is [email or web address].  She will send you a link to join the Zoom meeting.  (Please note that Sarah Chambers , MSW, LCSW, has resigned her position at [facility] [employer], but will continue to lead the online groups temporarily)  . Parkinson Foundation:  www.parkinson.org o PD Health at Home continues:  Mindfulness Mondays, Expert Briefing Tuesdays, Wellness Wednesdays, Take Time Thursdays, Fitness Fridays -Listings for January 2022 are on the website o Upcoming Webinar:  Sights, Sounds, and Parkinson's.  Wednesday, February 19, 2020, @ 1 pm  o Please check out their website to sign up for emails and see their full online offerings  . Michael [employer] Foundation:  www.michaeljfox.org  o Upcoming Webinar:   Diet, Exercise, and other Strategies for Living Well as you Age.  Thursday, February 08, 2020 @ 12 noon o Check out additional information on their website to see their full online offerings  . Davis Phinney Foundation:  www.davisphinneyfoundation.org o Upcoming Webinar:  Emerging Therapies in Parkinson's with Dr. Michael Okun.  Wednesday, February 12, 2020 @ 2 pm o Care Partner Monthly Meetup.  With Connie Carpenter Phinney.  First  Tuesday of each month, 2 pm o Check out additional information to Live Well Today on their website  . Parkinson and Movement Disorders (PMD) Alliance:  www.pmdalliance.org o [employer]:  Online Education Events o Sign up for emails, which are sent weekly to give you updates on programming and online offerings  . Parkinson's Association of [place]:  www.parkinsonassociation.org o Information on online support groups, online exercises including Yoga, Parkinson's exercises and more-LOTS of information on links to PD resources and online events o Virtual Support Group through Parkinson's Association of [place]; next one is scheduled for Wednesday, February 19, 2020 at 2 pm. (These are typically scheduled for the 1st Wednesday of the month at 2 pm).  Visit website for details.  . Additional links for movement activities: o PWR! Moves Classes at [facility] HAD RESUMED (but are ON HOLD DUE TO COVID in January)  Contact Amy Marriott, PT [email or web address] or [phone] if interested o Here is a link to the PWR!Moves classes on Zoom from [place] Parkinson's Foundation - Daily Mon-Sat at 10:00. Via Zoom, FREE and open to all.  There is also a link below via Facebook if you use that platform. - https://www.parkinsonsmi.org/mpf-programs/exercise-and-movement-activities - https://www.facebook.com/ParkinsonsMI.org/posts/pwr-moves-exercise-class-parkinson-wellness-recovery-online-with-angee-ludwa-pt-/10156827878021813/ o Parkinson's Wellness Recovery (PWR! Moves)  www.pwr4life.org - Info on the PWR! Virtual Experience:  You will have access to our expertise through self-assessment, guided plans that start with the PD-specific fundamentals, educational content, tips, Q&A with an expert, and a growing library of PD-specific pre-recorded and live exercise classes of varying types and intensity - both physical and cognitive! If that is not   enough, we offer 1:1 wellness  consultations (in-person or virtual) to personalize your PWR! Virtual Experience.  - Check out the PWR! Move of the month on the Parkinson Wellness Recovery website:  https://www.pwr4life.org/pwr-move-of-the-month-4/ o Parkinson Foundation Fitness Fridays:  - As part of the PD Health @ Home program, this free video series focuses each week on one aspect of fitness designed to support people living with Parkinson's.  -  www.parkinson.org/understanding-parkinsons/coronavirus/PD-health-at-home/Fitness-Fridays o Dance for PD website is offering free, live-stream classes throughout the week, as well as links to digital library of classes:  https://danceforparkinsons.org/ o Dance for Parkinson's Class:  [employer] [place].  Free offering for people with Parkinson's and care partners; virtual class.  o For more information, contact [phone] or email Magalli Morana at [email or web address] o Virtual dance and Pilates for Parkinson's classes: Click on the Community Tab> Parkinson's Movement Initiative Tab.  To register for classes and for more information, visit www.americandancefestival.org and click the "community" tab.  o [employer] Parkinson's Cycling Classes  - [employer]: 1pm on Fridays-Live classes at [employer] (Contact Beth McKinney at [email or web address] or [phone]) - [employer]: Virtual Classes Mondays and Thursdays (contact Priscilla at [email or web address] or [phone])   o [place] [employer]ing - Three levels of classes are offered Tuesdays and Thursdays:  10:30 am,  12 noon & 1:45 pm at [facility]. To observe a class or for  more information, call [phone] or email [email or web address] - PD Flow Yoga for Parkinson's:  Fridays 1-2 pm, January 7-March 13, 2020 . [employer]: o Online Caregiver Education Opportunities:  www.well-springsolutions.org/caregiver-education/caregiver-support-group.  You  may also contact Jodi Kolada at [email or web address] or [phone].   o [employer] Powerful Tools for Caregivers, 6 week series designed to provide family and caregivers with practical tools to care for themselves and loved ones.  Wednesdays 10:15-12:15, beginning January 12th - Contact Jodi Kolada (above) for details o [employer] Navigator:  Just1Navigator program, a free service to help individuals and families through the journey of determining care for older adults.  The "Navigator" is a social worker, Nicole Reynolds, who will speak with a prospective client and/or loved ones to provide an assessment of the situation and a set of recommendations for a personalized care plan - all free of charge, and whether [employer] offers the needed service or not. If the need is not a service we provide, we are well-connected with reputable programs in town that we can refer you to.  www.well-springsolutions.org or to speak with the Navigator, call [phone].  

## 2020-02-27 ENCOUNTER — Other Ambulatory Visit: Payer: Self-pay | Admitting: Neurology

## 2020-03-02 NOTE — Telephone Encounter (Signed)
Rx(s) sent to pharmacy electronically.  

## 2020-03-18 ENCOUNTER — Other Ambulatory Visit: Payer: Self-pay

## 2020-03-18 ENCOUNTER — Ambulatory Visit: Payer: Medicare Other | Admitting: *Deleted

## 2020-03-18 DIAGNOSIS — Z5181 Encounter for therapeutic drug level monitoring: Secondary | ICD-10-CM | POA: Diagnosis not present

## 2020-03-18 LAB — POCT INR: INR: 3.7 — AB (ref 2.0–3.0)

## 2020-03-18 NOTE — Patient Instructions (Signed)
Description   Hold today, then continue taking Warfarin 1.5 tablets every day except 2 tablets on Saturdays. Coumadin Clinic # [phone]. Call with any concerns  Recheck INR in 4 weeks.

## 2020-04-03 ENCOUNTER — Telehealth: Payer: Self-pay | Admitting: Pharmacist

## 2020-04-03 NOTE — Chronic Care Management (AMB) (Addendum)
I left the patient a message about his upcoming appointment on 04/06/2020 @ 9:00 am with the clinical pharmacist. He was asked to please have all medication on hand to review with the pharmacist.  Neita Goodnight) Mare Ferrari, [place] Assistant [phone]  Patient called back to reschedule his appt for April.  Jeni Salles, PharmD [facility] Clinical Pharmacist [place] [facility] [place]

## 2020-04-06 ENCOUNTER — Telehealth: Payer: Medicare Other

## 2020-04-06 DIAGNOSIS — Z961 Presence of intraocular lens: Secondary | ICD-10-CM | POA: Diagnosis not present

## 2020-04-06 DIAGNOSIS — H5213 Myopia, bilateral: Secondary | ICD-10-CM | POA: Diagnosis not present

## 2020-04-06 DIAGNOSIS — D3131 Benign neoplasm of right choroid: Secondary | ICD-10-CM | POA: Diagnosis not present

## 2020-04-06 DIAGNOSIS — H524 Presbyopia: Secondary | ICD-10-CM | POA: Diagnosis not present

## 2020-04-15 ENCOUNTER — Other Ambulatory Visit: Payer: Self-pay

## 2020-04-15 ENCOUNTER — Ambulatory Visit (INDEPENDENT_AMBULATORY_CARE_PROVIDER_SITE_OTHER): Payer: Medicare Other | Admitting: *Deleted

## 2020-04-15 DIAGNOSIS — Z5181 Encounter for therapeutic drug level monitoring: Secondary | ICD-10-CM | POA: Diagnosis not present

## 2020-04-15 LAB — POCT INR: INR: 3.5 — AB (ref 2.0–3.0)

## 2020-04-15 NOTE — Patient Instructions (Signed)
Description   Continue taking Warfarin 1.5 tablets every day except 2 tablets on Saturdays. Coumadin Clinic # [phone]. Call with any concerns  Recheck INR in 5 weeks.

## 2020-04-23 ENCOUNTER — Encounter: Payer: Self-pay | Admitting: Podiatry

## 2020-04-23 ENCOUNTER — Ambulatory Visit: Payer: Medicare Other | Admitting: Podiatry

## 2020-04-23 ENCOUNTER — Other Ambulatory Visit: Payer: Self-pay

## 2020-04-23 DIAGNOSIS — L84 Corns and callosities: Secondary | ICD-10-CM

## 2020-04-23 DIAGNOSIS — R52 Pain, unspecified: Secondary | ICD-10-CM | POA: Diagnosis not present

## 2020-04-23 NOTE — Progress Notes (Signed)
  Subjective:  Patient ID: David Benton, male    DOB: 02/11/1929,  MRN: [id number]  Chief Complaint  Patient presents with  . Callouses    Right foot callous     87 y.o. male returns with the above complaint. History confirmed with patient.  He is doing quite well with the urea cream and padding.  On the right foot submet 5 B/L now and the hallux  Objective:  Physical Exam: warm, good capillary refill, no trophic changes or ulcerative lesions, normal DP and PT pulses and normal sensory exam. Left Foot: Submet 5 large hyperkeratosis, improved since last visit in character and quality Right Foot: Submet 5 and submet 1 large hyperkeratoses, both improved and softer than previously   Assessment:   1. Callus of foot   2. Pain      Plan:  Patient was evaluated and treated and all questions answered.  All symptomatic hyperkeratoses were safely debrided with a sterile #15 blade to patient's level of comfort without incident. We discussed preventative and palliative care of these lesions including supportive and accommodative shoegear, padding, prefabricated and custom molded accommodative orthoses, use of a pumice stone and lotions/creams daily.  Continue use of dancers pads and urea cream 40%  We also discussed custom molded orthotics and this may help him at some point he will think about this  [name], DPM 04/23/2020    Return in about 9 weeks (around 06/25/2020) for calluses.

## 2020-04-24 ENCOUNTER — Other Ambulatory Visit: Payer: Self-pay | Admitting: Family Medicine

## 2020-04-27 ENCOUNTER — Other Ambulatory Visit: Payer: Self-pay | Admitting: Internal Medicine

## 2020-04-28 ENCOUNTER — Encounter: Payer: Medicare Other | Admitting: Family Medicine

## 2020-05-05 ENCOUNTER — Other Ambulatory Visit: Payer: Self-pay

## 2020-05-05 ENCOUNTER — Encounter: Payer: Self-pay | Admitting: Family Medicine

## 2020-05-05 ENCOUNTER — Ambulatory Visit (INDEPENDENT_AMBULATORY_CARE_PROVIDER_SITE_OTHER): Payer: Medicare Other | Admitting: Family Medicine

## 2020-05-05 VITALS — BP 110/60 | HR 61 | Temp 97.9°F | Ht 70.0 in | Wt 141.8 lb

## 2020-05-05 DIAGNOSIS — I6523 Occlusion and stenosis of bilateral carotid arteries: Secondary | ICD-10-CM | POA: Diagnosis not present

## 2020-05-05 DIAGNOSIS — E785 Hyperlipidemia, unspecified: Secondary | ICD-10-CM

## 2020-05-05 DIAGNOSIS — G20 Parkinson's disease: Secondary | ICD-10-CM

## 2020-05-05 DIAGNOSIS — R739 Hyperglycemia, unspecified: Secondary | ICD-10-CM | POA: Diagnosis not present

## 2020-05-05 DIAGNOSIS — I359 Nonrheumatic aortic valve disorder, unspecified: Secondary | ICD-10-CM

## 2020-05-05 DIAGNOSIS — Z8546 Personal history of malignant neoplasm of prostate: Secondary | ICD-10-CM

## 2020-05-05 DIAGNOSIS — K5909 Other constipation: Secondary | ICD-10-CM | POA: Diagnosis not present

## 2020-05-05 DIAGNOSIS — E039 Hypothyroidism, unspecified: Secondary | ICD-10-CM

## 2020-05-05 DIAGNOSIS — I10 Essential (primary) hypertension: Secondary | ICD-10-CM | POA: Diagnosis not present

## 2020-05-05 DIAGNOSIS — G20A1 Parkinson's disease without dyskinesia, without mention of fluctuations: Secondary | ICD-10-CM

## 2020-05-05 LAB — CBC WITH DIFFERENTIAL/PLATELET
Basophils Absolute: 0 10*3/uL (ref 0.0–0.1)
Basophils Relative: 1 % (ref 0.0–3.0)
Eosinophils Absolute: 0.2 10*3/uL (ref 0.0–0.7)
Eosinophils Relative: 4.6 % (ref 0.0–5.0)
HCT: 39.1 % (ref 39.0–52.0)
Hemoglobin: 13.5 g/dL (ref 13.0–17.0)
Lymphocytes Relative: 17.8 % (ref 12.0–46.0)
Lymphs Abs: 0.7 10*3/uL (ref 0.7–4.0)
MCHC: 34.5 g/dL (ref 30.0–36.0)
MCV: 88.1 fl (ref 78.0–100.0)
Monocytes Absolute: 0.6 10*3/uL (ref 0.1–1.0)
Monocytes Relative: 13.9 % — ABNORMAL HIGH (ref 3.0–12.0)
Neutro Abs: 2.6 10*3/uL (ref 1.4–7.7)
Neutrophils Relative %: 62.7 % (ref 43.0–77.0)
Platelets: 200 10*3/uL (ref 150.0–400.0)
RBC: 4.44 Mil/uL (ref 4.22–5.81)
RDW: 13.7 % (ref 11.5–15.5)
WBC: 4.1 10*3/uL (ref 4.0–10.5)

## 2020-05-05 LAB — LIPID PANEL
Cholesterol: 112 mg/dL (ref 0–200)
HDL: 42.4 mg/dL (ref >39.00)
LDL Cholesterol: 58 mg/dL (ref 0–99)
NonHDL: 69.2
Total CHOL/HDL Ratio: 3
Triglycerides: 58 mg/dL (ref 0.0–149.0)
VLDL: 11.6 mg/dL (ref 0.0–40.0)

## 2020-05-05 LAB — HEPATIC FUNCTION PANEL
ALT: 5 U/L (ref 0–53)
AST: 22 U/L (ref 0–37)
Albumin: 4 g/dL (ref 3.5–5.2)
Alkaline Phosphatase: 102 U/L (ref 39–117)
Bilirubin, Direct: 0.3 mg/dL (ref 0.0–0.3)
Total Bilirubin: 1.4 mg/dL — ABNORMAL HIGH (ref 0.2–1.2)
Total Protein: 6.4 g/dL (ref 6.0–8.3)

## 2020-05-05 LAB — BASIC METABOLIC PANEL
BUN: 25 mg/dL — ABNORMAL HIGH (ref 6–23)
CO2: 32 mEq/L (ref 19–32)
Calcium: 9.1 mg/dL (ref 8.4–10.5)
Chloride: 102 mEq/L (ref 96–112)
Creatinine, Ser: 0.97 mg/dL (ref 0.40–1.50)
GFR: 69.58 mL/min (ref >60.00)
Glucose, Bld: 99 mg/dL (ref 70–99)
Potassium: 4.5 mEq/L (ref 3.5–5.1)
Sodium: 138 mEq/L (ref 135–145)

## 2020-05-05 LAB — T3, FREE: T3, Free: 3.1 pg/mL (ref 2.3–4.2)

## 2020-05-05 LAB — TSH: TSH: 0.85 u[IU]/mL (ref 0.35–4.50)

## 2020-05-05 LAB — HEMOGLOBIN A1C: Hgb A1c MFr Bld: 5.8 % (ref 4.6–6.5)

## 2020-05-05 LAB — T4, FREE: Free T4: 0.89 ng/dL (ref 0.60–1.60)

## 2020-05-05 LAB — PSA: PSA: 0.06 ng/mL — ABNORMAL LOW (ref 0.10–4.00)

## 2020-05-05 NOTE — Progress Notes (Signed)
Subjective:    Patient ID: David Benton, male    DOB: [birth year], 87 y.o.   MRN: [id number]  HPI Here to follow up on issues. He is doing well. His Parkinsons seems to be stable. He makes it a point to exercise 5-6 days a week, including boxing classes. He sees Cardiology regularly. His BP is stable.   Review of Systems  Constitutional: Negative.   HENT: Negative.   Eyes: Negative.   Respiratory: Negative.   Cardiovascular: Negative.   Gastrointestinal: Negative.   Genitourinary: Negative.   Musculoskeletal: Negative.   Skin: Negative.   Neurological: Positive for tremors.  Psychiatric/Behavioral: Negative.        Objective:   Physical Exam Constitutional:      General: He is not in acute distress.    Appearance: Normal appearance. He is well-developed. He is not diaphoretic.  HENT:     Head: Normocephalic and atraumatic.     Right Ear: External ear normal.     Left Ear: External ear normal.     Nose: Nose normal.     Mouth/Throat:     Pharynx: No oropharyngeal exudate.  Eyes:     General: No scleral icterus.       Right eye: No discharge.        Left eye: No discharge.     Conjunctiva/sclera: Conjunctivae normal.     Pupils: Pupils are equal, round, and reactive to light.  Neck:     Thyroid: No thyromegaly.     Vascular: No JVD.     Trachea: No tracheal deviation.  Cardiovascular:     Rate and Rhythm: Normal rate and regular rhythm.     Heart sounds: Normal heart sounds. No murmur heard. No friction rub. No gallop.   Pulmonary:     Effort: Pulmonary effort is normal. No respiratory distress.     Breath sounds: Normal breath sounds. No wheezing or rales.  Chest:     Chest wall: No tenderness.  Abdominal:     General: Bowel sounds are normal. There is no distension.     Palpations: Abdomen is soft. There is no mass.     Tenderness: There is no abdominal tenderness. There is no guarding or rebound.  Genitourinary:    Penis: Normal. No tenderness.       Testes: Normal.     Prostate: Normal.     Rectum: Normal. Guaiac result negative.  Musculoskeletal:        General: No tenderness. Normal range of motion.     Cervical back: Neck supple.  Lymphadenopathy:     Cervical: No cervical adenopathy.  Skin:    General: Skin is warm and dry.     Coloration: Skin is not pale.     Findings: No erythema or rash.  Neurological:     Mental Status: He is alert and oriented to person, place, and time.     Cranial Nerves: No cranial nerve deficit.     Motor: No abnormal muscle tone.     Coordination: Coordination normal.     Deep Tendon Reflexes: Reflexes are normal and symmetric. Reflexes normal.  Psychiatric:        Behavior: Behavior normal.        Thought Content: Thought content normal.        Judgment: Judgment normal.           Assessment & Plan:  His HTN and Parkinson's are stable. He gets regular Coumadin testing. We will get fasting  labs to check lipids, a thyroid panel, etc. He will follow up with Cardiology and Neurology.  Alysia Penna, MD

## 2020-05-05 NOTE — Addendum Note (Signed)
Addended by: Elmer Picker on: 05/05/2020 08:55 AM   Modules accepted: Orders

## 2020-05-08 ENCOUNTER — Telehealth: Payer: Self-pay | Admitting: Pharmacist

## 2020-05-08 NOTE — Chronic Care Management (AMB) (Signed)
I left the patient a message about his upcoming appointment on 05/11/2020 @ 10:00 am with the clinical pharmacist. He was asked to please have all medication on hand to review with the pharmacist.   Neita Goodnight) Mare Ferrari, [place] [phone]

## 2020-05-11 ENCOUNTER — Ambulatory Visit (INDEPENDENT_AMBULATORY_CARE_PROVIDER_SITE_OTHER): Payer: Medicare Other | Admitting: Pharmacist

## 2020-05-11 DIAGNOSIS — E785 Hyperlipidemia, unspecified: Secondary | ICD-10-CM

## 2020-05-11 DIAGNOSIS — I10 Essential (primary) hypertension: Secondary | ICD-10-CM | POA: Diagnosis not present

## 2020-05-11 DIAGNOSIS — E039 Hypothyroidism, unspecified: Secondary | ICD-10-CM

## 2020-05-11 NOTE — Progress Notes (Signed)
Chronic Care Management Pharmacy Note  05/17/2020 Name:  YORK VALLIANT MRN:  [id number] DOB:  08-05-1929  Subjective: David Benton is an 87 y.o. year old male who is a primary patient of [name], MD.  The CCM team was consulted for assistance with disease management and care coordination needs.    Engaged with patient by telephone for follow up visit in response to provider referral for pharmacy case management and/or care coordination services.   Consent to Services:  The patient was given information about Chronic Care Management services, agreed to services, and gave verbal consent prior to initiation of services.  Please see initial visit note for detailed documentation.   Patient Care Team: [name], MD as PCP - General (Family Medicine) Sherren Mocha, MD as PCP - Cardiology (Cardiology) Grace Isaac, MD (Inactive) as Consulting Physician (Cardiothoracic Surgery) Sharyne Peach, MD as Consulting Physician (Ophthalmology) Druscilla Brownie, MD as Consulting Physician (Dermatology) Irine Seal, MD as Consulting Physician (Urology) Larey Dresser, MD as Consulting Physician (Cardiology) Viona Gilmore, [facility] as Pharmacist (Pharmacist) Tat, Eustace Quail, DO as Consulting Physician (Neurology)  Recent office visits: 05/05/20 Alysia Penna, MD: Patient presented for follow up.   Recent consult visits: 04/23/20 [name], DPM (podiatry): Patient presented for callus debridement.  04/15/20 Johny Shock, RN (cardiology): Patient presented for anti-coag visit. INR 3.5, goal 2.5-3.5, continued warfarin 10 mg (5 mg x 2) every Sat; 7.5 mg (5 mg x 1.5) all other days.   02/21/20 Alonza Bogus, DO (neurology): Patient presented for Parkinson's follow up. No medication changes.  02/20/20 [name], DPM (podiatry): Patient presented for callus debridement.  01/02/20 Cindie Crumbly (dermatology): Unable to access notes.     Hospital visits: None in previous 6  months  Objective:  Lab Results  Component Value Date   CREATININE 0.97 05/05/2020   BUN 25 (H) 05/05/2020   GFR 69.58 05/05/2020   GFRNONAA 59 (L) 11/17/2016   GFRAA >60 11/17/2016   NA 138 05/05/2020   K 4.5 05/05/2020   CALCIUM 9.1 05/05/2020   CO2 32 05/05/2020   GLUCOSE 99 05/05/2020    Lab Results  Component Value Date/Time   HGBA1C 5.8 05/05/2020 08:56 AM   GFR 69.58 05/05/2020 08:56 AM   GFR 73.97 04/25/2019 10:26 AM    Last diabetic Eye exam: No results found for: HMDIABEYEEXA  Last diabetic Foot exam: No results found for: HMDIABFOOTEX   Lab Results  Component Value Date   CHOL 112 05/05/2020   HDL 42.40 05/05/2020   LDLCALC 58 05/05/2020   TRIG 58.0 05/05/2020   CHOLHDL 3 05/05/2020    Hepatic Function Latest Ref Rng & Units 05/05/2020 04/25/2019 02/26/2018  Total Protein 6.0 - 8.3 g/dL 6.4 6.6 6.5  Albumin 3.5 - 5.2 g/dL 4.0 4.5 4.2  AST 0 - 37 U/L '22 26 18  ' ALT 0 - 53 U/L '5 24 13  ' Alk Phosphatase 39 - 117 U/L 102 89 101  Total Bilirubin 0.2 - 1.2 mg/dL 1.4(H) 1.9(H) 1.9(H)  Bilirubin, Direct 0.0 - 0.3 mg/dL 0.3 0.3 0.3    Lab Results  Component Value Date/Time   TSH 0.85 05/05/2020 08:56 AM   TSH 1.34 04/25/2019 10:26 AM   FREET4 0.89 05/05/2020 08:56 AM   FREET4 0.92 04/25/2019 10:26 AM    CBC Latest Ref Rng & Units 05/05/2020 04/25/2019 02/26/2018  WBC 4.0 - 10.5 K/uL 4.1 4.1 4.8  Hemoglobin 13.0 - 17.0 g/dL 13.5 14.5 14.2  Hematocrit  39.0 - 52.0 % 39.1 42.0 40.7  Platelets 150.0 - 400.0 K/uL 200.0 224.0 222.0    No results found for: VD25OH  Clinical ASCVD: Yes  The ASCVD Risk score Mikey Bussing DC Jr., et al., 2013) failed to calculate for the following reasons:   The 2013 ASCVD risk score is only valid for ages 80 to 31    Depression screen PHQ 2/9 04/25/2019 02/23/2018 05/24/2017  Decreased Interest 0 0 0  Down, Depressed, Hopeless 0 0 0  PHQ - 2 Score 0 0 0  Some recent data might be hidden     Social History   Tobacco Use  Smoking Status  Former Smoker  . Packs/day: 0.50  . Years: 10.00  . Pack years: 5.00  . Types: Cigarettes  . Quit date: 02/06/1972  . Years since quitting: 48.3  Smokeless Tobacco Never Used   BP Readings from Last 3 Encounters:  05/05/20 110/60  02/21/20 120/82  10/11/19 110/60   Pulse Readings from Last 3 Encounters:  05/05/20 61  02/21/20 74  10/11/19 65   Wt Readings from Last 3 Encounters:  05/05/20 141 lb 12.8 oz (64.3 kg)  02/21/20 141 lb (64 kg)  10/11/19 139 lb (63 kg)   BMI Readings from Last 3 Encounters:  05/05/20 20.35 kg/m  02/21/20 20.23 kg/m  10/11/19 19.94 kg/m    Assessment/Interventions: Review of patient past medical history, allergies, medications, health status, including review of consultants reports, laboratory and other test data, was performed as part of comprehensive evaluation and provision of chronic care management services.   SDOH:  (Social Determinants of Health) assessments and interventions performed: No  SDOH Screenings   Alcohol Screen: Not on file  Depression (NAT5-5): Not on file  Financial Resource Strain: Low Risk   . Difficulty of Paying Living Expenses: Not hard at all  Food Insecurity: Not on file  Housing: Not on file  Physical Activity: Not on file  Social Connections: Not on file  Stress: Not on file  Tobacco Use: Medium Risk  . Smoking Tobacco Use: Former Smoker  . Smokeless Tobacco Use: Never Used  Transportation Needs: No Transportation Needs  . Lack of Transportation (Medical): No  . Lack of Transportation (Non-Medical): No    CCM Care Plan  Allergies  Allergen Reactions  . Codeine     REACTION: N/V    Medications Reviewed Today    Reviewed by [name], MD (Physician) on 05/05/20 at [phone]  Med List Status: <None>  Medication Order Taking? Sig Documenting Provider Last Dose Status Informant  ACETAMINOPHEN PO [id number] Yes Take 650 mg by mouth every 6 (six) hours as needed for pain.  [provider] Taking  Active   amoxicillin (AMOXIL) 500 MG capsule [id number] Yes Take 4 capsules (2,000 mg) one hour prior to all dental visits. Richardson Dopp T, PA-C Taking Active   atorvastatin (LIPITOR) 40 MG tablet [id number] Yes TAKE 1 TABLET BY MOUTH EVERY DAY End, Christopher, MD Taking Active   betamethasone dipropionate 0.05 % cream [id number] Yes APPLY TO AFFECTED AREA OF SKIN UP TO 2 TIMES DAILY AS NEEDED. DO NOT APPLY TO FACE, GROIN, OR AXILLA [provider] Taking Active   carbidopa-levodopa (SINEMET IR) 25-100 MG tablet [id number] Yes TAKE 1 TABLET BY MOUTH 3 TIMES DAILY Tat, Eustace Quail, DO Taking Active   levothyroxine (SYNTHROID) 75 MCG tablet [id number] Yes TAKE 1 TABLET BY MOUTH EVERY DAY [name], MD Taking Active   magnesium hydroxide (MILK OF MAGNESIA) 400  MG/5ML suspension [id number] Yes Take 30 mLs by mouth daily as needed. Reported on 04/21/2015 [provider] Taking Active Self  meclizine (ANTIVERT) 25 MG tablet [id number] Yes Take 1 tablet (25 mg total) by mouth every 4 (four) hours as needed for dizziness. [name], MD Taking Active   Probiotic Product (PROBIOTIC-10 ULTIMATE) CAPS [id number] Yes Take 1 capsule by mouth daily. [provider] Taking Active   saline (AYR) GEL [id number] Yes Place 1 application into the nose in the morning and at bedtime.  [provider] Taking Active   sodium chloride (OCEAN) 0.65 % SOLN nasal spray [id number] Yes Place 1 spray into both nostrils as needed for congestion. Reported on 04/21/2015 [provider] Taking Active   TOLAK 4 % CREA [id number] Yes Apply 1 a small amount to affected area once a day  Apply to scalp and arms    RX 2 Tubes [provider] Taking Active   triamterene-hydrochlorothiazide (MAXZIDE-25) 37.5-25 MG tablet [id number] Yes TAKE 1 TABLET BY MOUTH EVERY Ellan Lambert, MD Taking Active   warfarin (COUMADIN) 5 MG tablet [id number] Yes TAKE AS DIRECTED BY COUMADIN CLINIC End,  Harrell Gave, MD Taking Active           Patient Active Problem List   Diagnosis Date Noted  . Hypothyroidism 02/23/2018  . Parkinson's disease ([place]) 01/26/2017  . Right inguinal hernia 11/18/2016  . Bilateral carotid artery stenosis 07/10/2016  . 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 Encounter for therapeutic drug monitoring 02/27/2013  . Head trauma 05/24/2011  . Long term (current) use of anticoagulants 09/15/2010  . S/P aortic valve replacement 05/20/2010  . SHINGLES 11/26/2009  . Thoracic aortic aneurysm without rupture ([place]) 09/29/2008  . AORTIC VALVE REPLACEMENT, HX OF 09/29/2008  . Aortic valve disorder 09/20/2008  . HEMORRHOIDS-EXTERNAL 09/12/2008  . PERSONAL HX COLONIC POLYPS 09/12/2008  . CONSTIPATION, CHRONIC 08/04/2008  . Hyperlipidemia LDL goal <70 10/24/2007  . Essential hypertension 10/24/2007  . NEOPLASM, MALIGNANT, PROSTATE, HX OF 10/24/2007  . MENIERE'S DISEASE, HX OF 10/24/2007    Immunization History  Administered Date(s) Administered  . Fluad Quad(high Dose 65+) 10/16/2018, 10/11/2019  . Influenza Split 10/09/2013, 10/08/2014, 10/10/2016  . Influenza Whole 11/10/2008, 10/10/2011  . Influenza, High Dose Seasonal PF 09/21/2017  . Influenza-Unspecified 09/17/2012, 10/08/2014, 10/21/2015  . PFIZER(Purple Top)SARS-COV-2 Vaccination 01/24/2019, 02/13/2019, 11/08/2019  . Pneumococcal Conjugate-13 03/07/2013  . Pneumococcal Polysaccharide-23 01/19/2003  . Td 01/19/2003  . Tdap 05/21/2013  . Zoster Recombinat (Shingrix) 02/23/2018, 06/26/2018   Patient reports that sometimes when he first gets up, he gets lightheaded when getting out of bed pretty fast. This only happens for a few days in a row and then won't happen and doesn't happen every day or a majority of the time.  Conditions to be addressed/monitored:  Hypertension, Hyperlipidemia, Hypothyroidism and Parkinson's disease, constipation, vertigo  Care Plan : [place]  Updates made  by Viona Gilmore, [place] since 05/17/2020 12:00 AM    Problem: Problem: Hypertension, Hyperlipidemia, Hypothyroidism and Parkinson's disease, constipation, vertigo     Long-Range Goal: Patient-Specific Goal   Start Date: 05/11/2020  Expected End Date: 05/11/2021  This Visit's Progress: On track  Priority: High  Note:   Current Barriers:  . Unable to independently monitor therapeutic efficacy  Pharmacist Clinical Goal(s):  Marland Kitchen Patient will achieve adherence to monitoring guidelines and medication adherence to achieve therapeutic efficacy through collaboration with PharmD and provider.   Interventions: . 1:1 collaboration with [name], MD  regarding development and update of comprehensive plan of care as evidenced by provider attestation and co-signature . Inter-disciplinary care team collaboration (see longitudinal plan of care) . Comprehensive medication review performed; medication list updated in electronic medical record  Hypertension (BP goal <140/90) -Controlled -Current treatment: . Triamterene/HCTZ 37.5-25 mg 1 tablet every morning -Medications previously tried: none  -Current home readings: still not checking at home; just purchase a BP cuff that he will have his wife help him figure out -Current dietary habits: patient rarely uses salt, only for a bit of seasoning on baked potato or egg -Current exercise habits: patient is very active and exercises every day -Denies hypotensive/hypertensive symptoms -Educated on Exercise goal of 150 minutes per week; Importance of home blood pressure monitoring; Proper BP monitoring technique; -Counseled to monitor BP at home weekly, document, and provide log at future appointments -Counseled on diet and exercise extensively Recommended to continue current medication  Hyperlipidemia: (LDL goal < 70) -Controlled -Current treatment: . Atorvastatin 40 mg 1 tablet daily -Medications previously tried: none  -Current dietary patterns:  did not discuss -Current exercise habits: patient is very active and exercises almost every day -Educated on Cholesterol goals;  Importance of limiting foods high in cholesterol; Exercise goal of 150 minutes per week; -Counseled on diet and exercise extensively Recommended to continue current medication  Hypothyroidism (Goal: TSH 0.35-4.5) -Controlled -Current treatment  . Levothyroxine 75 mcg 1 tablet daily -Medications previously tried: none  -Recommended to continue current medication  Parkinson's disease (Goal: minimize symptoms) -Controlled -Current treatment  . Carbidopa-levodopa 25-100 mg 1 tablet 3 times daily -Medications previously tried: none  -Recommended to continue current medication Counseled on side effects of carbidopa-levodopa   Aortic valve replacement  (Goal: prevent blood clots) -Controlled -Current treatment  . Warfarin 5 mg tablet, 10 mg on Sat and 7.5 mg on all other days -Medications previously tried: none  -Recommended to continue current medication  Vertigo (Goal: minimize symptoms) -Controlled -Current treatment  . Meclizine 25 mg 1 tablet as needed  -Medications previously tried: none  -Recommended to continue current medication Counseled on relaxation techniques (deep breathing, yoga) as sometimes vertigo can be brought on by stress/anxiety  Constipation (Goal: regular bowel movements) -Controlled -Current treatment  . Milk of magnesia 10 mLs as needed for constipation -Medications previously tried: n/a  -Educated on incorporating more fiber in his diet and drinking plenty of water to prevent constipation; recommended trying a stool softener or Miralax   Health Maintenance -Vaccine gaps: none -Current therapy:   Acetaminophen 650 mg 1 tablet as needed for pain - 4 most in one day  Amoxicillin 500 mg 4 capsules prior to dental work  Probiotic 1 capsule daily  Betamethasone 0.05% cream apply to affected area up to twice  daily  Saline nasal spray  Triamcinolone 0.1% ointment apply to affected area twice daily -Educated on Cost vs benefit of each product must be carefully weighed by individual consumer -Patient is satisfied with current therapy and denies issues -Recommended to continue current medication  Patient Goals/Self-Care Activities . Patient will:  - take medications as prescribed check blood pressure weekly, document, and provide at future appointments target a minimum of 150 minutes of moderate intensity exercise weekly  Follow Up Plan: Telephone follow up appointment with care management team member scheduled for: 6 months       Medication Assistance: None required.  Patient affirms current coverage meets needs.  Patient's preferred pharmacy is:  [place], [place] [address] [place] [address] Phone: [phone] Fax: [phone]  Uses pill box? Yes Pt endorses 100% compliance  We discussed: Current pharmacy is preferred with insurance plan and patient is satisfied with pharmacy services Patient decided to: Continue current medication management strategy  Care Plan and Follow Up Patient Decision:  Patient agrees to Care Plan and Follow-up.  Plan: Telephone follow up appointment with care management team member scheduled for:  6 months  Jeni Salles, PharmD [place] [facility] [place] [phone]  Going to dermatology - 6 months, BP assessment sooner

## 2020-05-12 DIAGNOSIS — L821 Other seborrheic keratosis: Secondary | ICD-10-CM | POA: Diagnosis not present

## 2020-05-12 DIAGNOSIS — L814 Other melanin hyperpigmentation: Secondary | ICD-10-CM | POA: Diagnosis not present

## 2020-05-12 DIAGNOSIS — Z85828 Personal history of other malignant neoplasm of skin: Secondary | ICD-10-CM | POA: Diagnosis not present

## 2020-05-12 DIAGNOSIS — D1801 Hemangioma of skin and subcutaneous tissue: Secondary | ICD-10-CM | POA: Diagnosis not present

## 2020-05-12 DIAGNOSIS — D229 Melanocytic nevi, unspecified: Secondary | ICD-10-CM | POA: Diagnosis not present

## 2020-05-12 DIAGNOSIS — L905 Scar conditions and fibrosis of skin: Secondary | ICD-10-CM | POA: Diagnosis not present

## 2020-05-12 DIAGNOSIS — L570 Actinic keratosis: Secondary | ICD-10-CM | POA: Diagnosis not present

## 2020-05-17 NOTE — Patient Instructions (Addendum)
Hi Fares,   It was great to get to speak with you again! As we discussed, please make sure to start checking your blood pressure more regularly. Below is a summary of some of the topics we discussed.   Please reach out to me if you have any questions or need anything before our follow up!  Best, Maddie  Jeni Salles, PharmD, [facility]  Visit Information  Goals Addressed   None    Patient Care Plan: CCM Pharmacy Care Plan    Problem Identified: Problem: Hypertension, Hyperlipidemia, Hypothyroidism and Parkinson's disease, constipation, vertigo     Long-Range Goal: Patient-Specific Goal   Start Date: 05/11/2020  Expected End Date: 05/11/2021  This Visit's Progress: On track  Priority: High  Note:   Current Barriers:  . Unable to independently monitor therapeutic efficacy  Pharmacist Clinical Goal(s):  Marland Kitchen Patient will achieve adherence to monitoring guidelines and medication adherence to achieve therapeutic efficacy through collaboration with PharmD and provider.   Interventions: . 1:1 collaboration with [name], MD regarding development and update of comprehensive plan of care as evidenced by provider attestation and co-signature . Inter-disciplinary care team collaboration (see longitudinal plan of care) . Comprehensive medication review performed; medication list updated in electronic medical record  Hypertension (BP goal <140/90) -Controlled -Current treatment: . Triamterene/HCTZ 37.5-25 mg 1 tablet every morning -Medications previously tried: none  -Current home readings: still not checking at home; just purchase a BP cuff that he will have his wife help him figure out -Current dietary habits: patient rarely uses salt, only for a bit of seasoning on baked potato or egg -Current exercise habits: patient is very active and exercises every day -Denies hypotensive/hypertensive symptoms -Educated on  Exercise goal of 150 minutes per week; Importance of home blood pressure monitoring; Proper BP monitoring technique; -Counseled to monitor BP at home weekly, document, and provide log at future appointments -Counseled on diet and exercise extensively Recommended to continue current medication  Hyperlipidemia: (LDL goal < 70) -Controlled -Current treatment: . Atorvastatin 40 mg 1 tablet daily -Medications previously tried: none  -Current dietary patterns: did not discuss -Current exercise habits: patient is very active and exercises almost every day -Educated on Cholesterol goals;  Importance of limiting foods high in cholesterol; Exercise goal of 150 minutes per week; -Counseled on diet and exercise extensively Recommended to continue current medication  Hypothyroidism (Goal: TSH 0.35-4.5) -Controlled -Current treatment  . Levothyroxine 75 mcg 1 tablet daily -Medications previously tried: none  -Recommended to continue current medication  Parkinson's disease (Goal: minimize symptoms) -Controlled -Current treatment  . Carbidopa-levodopa 25-100 mg 1 tablet 3 times daily -Medications previously tried: none  -Recommended to continue current medication Counseled on side effects of carbidopa-levodopa   Aortic valve replacement  (Goal: prevent blood clots) -Controlled -Current treatment  . Warfarin 5 mg tablet, 10 mg on Sat and 7.5 mg on all other days -Medications previously tried: none  -Recommended to continue current medication  Vertigo (Goal: minimize symptoms) -Controlled -Current treatment  . Meclizine 25 mg 1 tablet as needed  -Medications previously tried: none  -Recommended to continue current medication Counseled on relaxation techniques (deep breathing, yoga) as sometimes vertigo can be brought on by stress/anxiety  Constipation (Goal: regular bowel movements) -Controlled -Current treatment  . Milk of magnesia 10 mLs as needed for constipation -Medications  previously tried: n/a  -Educated on incorporating more fiber in his diet and drinking plenty of water to prevent constipation; recommended  trying a stool softener or Miralax   Health Maintenance -Vaccine gaps: none -Current therapy:   Acetaminophen 650 mg 1 tablet as needed for pain - 4 most in one day  Amoxicillin 500 mg 4 capsules prior to dental work  Probiotic 1 capsule daily  Betamethasone 0.05% cream apply to affected area up to twice daily  Saline nasal spray  Triamcinolone 0.1% ointment apply to affected area twice daily -Educated on Cost vs benefit of each product must be carefully weighed by individual consumer -Patient is satisfied with current therapy and denies issues -Recommended to continue current medication  Patient Goals/Self-Care Activities . Patient will:  - take medications as prescribed check blood pressure weekly, document, and provide at future appointments target a minimum of 150 minutes of moderate intensity exercise weekly  Follow Up Plan: Telephone follow up appointment with care management team member scheduled for: 6 months       Patient verbalizes understanding of instructions provided today and agrees to view in [place].  Telephone follow up appointment with pharmacy team member scheduled for: 6 months  Viona Gilmore, [facility]  How to Take Your Blood Pressure Blood pressure measures how strongly your blood is pressing against the walls of your arteries. Arteries are blood vessels that carry blood from your heart throughout your body. You can take your blood pressure at home with a machine. You may need to check your blood pressure at home:  To check if you have high blood pressure (hypertension).  To check your blood pressure over time.  To make sure your blood pressure medicine is working. Supplies needed:  Blood pressure machine, or monitor.  Dining room chair to sit in.  Table or desk.  Small notebook.  Pencil or pen. How to  prepare Avoid these things for 30 minutes before checking your blood pressure:  Having drinks with caffeine in them, such as coffee or tea.  Drinking alcohol.  Eating.  Smoking.  Exercising. Do these things five minutes before checking your blood pressure:  Go to the bathroom and pee (urinate).  Sit in a dining chair. Do not sit in a soft couch or an armchair.  Be quiet. Do not talk. How to take your blood pressure Follow the instructions that came with your machine. If you have a digital blood pressure monitor, these may be the instructions: 1. Sit up straight. 2. Place your feet on the floor. Do not cross your ankles or legs. 3. Rest your left arm at the level of your heart. You may rest it on a table, desk, or chair. 4. Pull up your shirt sleeve. 5. Wrap the blood pressure cuff around the upper part of your left arm. The cuff should be 1 inch (2.5 cm) above your elbow. It is best to wrap the cuff around bare skin. 6. Fit the cuff snugly around your arm. You should be able to place only one finger between the cuff and your arm. 7. Place the cord so that it rests in the bend of your elbow. 8. Press the power button. 9. Sit quietly while the cuff fills with air and loses air. 10. Write down the numbers on the screen. 11. Wait 2-3 minutes and then repeat steps 1-10.   What do the numbers mean? Two numbers make up your blood pressure. The first number is called systolic pressure. The second is called diastolic pressure. An example of a blood pressure reading is "120 over 80" (or 120/80). If you are an adult and  do not have a medical condition, use this guide to find out if your blood pressure is normal: Normal  First number: below 120.  Second number: below 80. Elevated  First number: 120-129.  Second number: below 80. Hypertension stage 1  First number: 130-139.  Second number: 80-89. Hypertension stage 2  First number: 140 or above.  Second number: 43 or  above. Your blood pressure is above normal even if only the top or bottom number is above normal. Follow these instructions at home:  Check your blood pressure as often as your doctor tells you to.  Check your blood pressure at the same time every day.  Take your monitor to your next doctor's appointment. Your doctor will: ? Make sure you are using it correctly. ? Make sure it is working right.  Make sure you understand what your blood pressure numbers should be.  Tell your doctor if your medicine is causing side effects.  Keep all follow-up visits as told by your doctor. This is important. General tips:  You will need a blood pressure machine, or monitor. Your doctor can suggest a monitor. You can buy one at a drugstore or online. When choosing one: ? Choose one with an arm cuff. ? Choose one that wraps around your upper arm. Only one finger should fit between your arm and the cuff. ? Do not choose one that measures your blood pressure from your wrist or finger. Where to find more information American Heart Association: www.heart.org Contact a doctor if:  Your blood pressure keeps being high. Get help right away if:  Your first blood pressure number is higher than 180.  Your second blood pressure number is higher than 120. Summary  Check your blood pressure at the same time every day.  Avoid caffeine, alcohol, smoking, and exercise for 30 minutes before checking your blood pressure.  Make sure you understand what your blood pressure numbers should be. This information is not intended to replace advice given to you by your health care provider. Make sure you discuss any questions you have with your health care provider. Document Revised: 12/28/2018 Document Reviewed: 12/28/2018 Elsevier Patient Education  2021 [employer].

## 2020-05-20 ENCOUNTER — Other Ambulatory Visit: Payer: Self-pay

## 2020-05-20 ENCOUNTER — Ambulatory Visit: Payer: Medicare Other

## 2020-05-20 DIAGNOSIS — Z5181 Encounter for therapeutic drug level monitoring: Secondary | ICD-10-CM

## 2020-05-20 DIAGNOSIS — Z7901 Long term (current) use of anticoagulants: Secondary | ICD-10-CM

## 2020-05-20 DIAGNOSIS — I359 Nonrheumatic aortic valve disorder, unspecified: Secondary | ICD-10-CM | POA: Diagnosis not present

## 2020-05-20 LAB — POCT INR: INR: 3.4 — AB (ref 2.0–3.0)

## 2020-05-20 NOTE — Patient Instructions (Signed)
Description   Continue taking Warfarin 1.5 tablets every day except 2 tablets on Saturdays. [facility] # [phone]. Call with any concerns  Recheck INR in 6 weeks.

## 2020-06-25 ENCOUNTER — Other Ambulatory Visit: Payer: Self-pay

## 2020-06-25 ENCOUNTER — Telehealth: Payer: Self-pay | Admitting: Family Medicine

## 2020-06-25 ENCOUNTER — Ambulatory Visit: Payer: Medicare Other | Admitting: Podiatry

## 2020-06-25 DIAGNOSIS — L84 Corns and callosities: Secondary | ICD-10-CM | POA: Diagnosis not present

## 2020-06-25 DIAGNOSIS — M7741 Metatarsalgia, right foot: Secondary | ICD-10-CM | POA: Diagnosis not present

## 2020-06-25 DIAGNOSIS — M7742 Metatarsalgia, left foot: Secondary | ICD-10-CM

## 2020-06-25 NOTE — Telephone Encounter (Signed)
Left message for patient to call back and schedule Medicare Annual Wellness Visit (AWV) either virtually or in office.   Last AWV ;05/24/17 please schedule at anytime with [facility] Nurse Health Advisor 1 or 2   This should be a 45 minute visit.

## 2020-06-28 ENCOUNTER — Encounter: Payer: Self-pay | Admitting: Podiatry

## 2020-06-28 NOTE — Progress Notes (Signed)
  Subjective:  Patient ID: David Benton, male    DOB: 04-02-1929,  MRN: [id number]  Chief Complaint  Patient presents with   Callouses    Painful callus lesion, 9 week follow up, patient is taking Coumadin    87 y.o. male returns with the above complaint. History confirmed with patient.  He is doing quite well with the urea cream and padding.  On the right foot submet 5 B/L now and the hallux  Objective:  Physical Exam: warm, good capillary refill, no trophic changes or ulcerative lesions, normal DP and PT pulses and normal sensory exam.  Left Foot: Submet 5 large hyperkeratosis, improved since last visit in character and quality Right Foot: Submet 5 and submet 1 large hyperkeratoses, both improved and softer than previously   Assessment:   No diagnosis found.    Plan:  Patient was evaluated and treated and all questions answered.  All symptomatic hyperkeratoses were safely debrided with a sterile #15 blade to patient's level of comfort without incident. We discussed preventative and palliative care of these lesions including supportive and accommodative shoegear, padding, prefabricated and custom molded accommodative orthoses, use of a pumice stone and lotions/creams daily.  Continue use of dancers pads and urea cream 40%  We also discussed custom molded orthotics and this may help him at some point he will think about this  [name], DPM 06/28/2020    Return in about 9 weeks (around 08/27/2020).

## 2020-06-29 ENCOUNTER — Other Ambulatory Visit: Payer: Self-pay

## 2020-06-29 ENCOUNTER — Ambulatory Visit (INDEPENDENT_AMBULATORY_CARE_PROVIDER_SITE_OTHER): Payer: Medicare Other

## 2020-06-29 DIAGNOSIS — Z0000 Encounter for general adult medical examination without abnormal findings: Secondary | ICD-10-CM | POA: Diagnosis not present

## 2020-06-29 NOTE — Progress Notes (Signed)
Subjective:   David Benton is a 87 y.o. male who presents for an Initial Medicare Annual Wellness Visit.  I connected with Kellie Simmering today by telephone and verified that I am speaking with the correct person using two identifiers. Location patient: home Location provider: work Persons participating in the virtual visit: patient, provider.   I discussed the limitations, risks, security and privacy concerns of performing an evaluation and management service by telephone and the availability of in person appointments. I also discussed with the patient that there may be a patient responsible charge related to this service. The patient expressed understanding and verbally consented to this telephonic visit.    Interactive audio and video telecommunications were attempted between this provider and patient, however failed, due to patient having technical difficulties OR patient did not have access to video capability.  We continued and completed visit with audio only.    Review of Systems    N/a       Objective:    There were no vitals filed for this visit. There is no height or weight on file to calculate BMI.  Advanced Directives 02/21/2020 10/01/2019 07/29/2019 07/13/2018 05/24/2017 11/18/2016 11/14/2016  Does Patient Have a Medical Advance Directive? Yes Yes Yes Yes Yes Yes Yes  Type of Advance Directive - - [place];Living will Living will;Healthcare Power of [place];Living will -  Does patient want to make changes to medical advance directive? - - - - - No - Patient declined -  Copy of [place] in Chart? - - - - - Yes No - copy requested    Current Medications (verified) Outpatient Encounter Medications as of 06/29/2020  Medication Sig   ACETAMINOPHEN PO Take 650 mg by mouth every 6 (six) hours as needed for pain.    amoxicillin (AMOXIL) 500 MG capsule Take 4 capsules (2,000 mg) one hour prior to all dental  visits.   atorvastatin (LIPITOR) 40 MG tablet TAKE 1 TABLET BY MOUTH EVERY DAY   betamethasone dipropionate 0.05 % cream APPLY TO AFFECTED AREA OF SKIN UP TO 2 TIMES DAILY AS NEEDED. DO NOT APPLY TO FACE, GROIN, OR AXILLA   carbidopa-levodopa (SINEMET IR) 25-100 MG tablet TAKE 1 TABLET BY MOUTH 3 TIMES DAILY   levothyroxine (SYNTHROID) 75 MCG tablet TAKE 1 TABLET BY MOUTH EVERY DAY   magnesium hydroxide (MILK OF MAGNESIA) 400 MG/5ML suspension Take 30 mLs by mouth daily as needed. Reported on 04/21/2015   meclizine (ANTIVERT) 25 MG tablet Take 1 tablet (25 mg total) by mouth every 4 (four) hours as needed for dizziness.   Probiotic Product (PROBIOTIC-10 ULTIMATE) CAPS Take 1 capsule by mouth daily.   saline (AYR) GEL Place 1 application into the nose in the morning and at bedtime.    sodium chloride (OCEAN) 0.65 % SOLN nasal spray Place 1 spray into both nostrils as needed for congestion. Reported on 04/21/2015   TOLAK 4 % CREA Apply 1 a small amount to affected area once a day  Apply to scalp and arms    RX 2 Tubes   triamterene-hydrochlorothiazide (MAXZIDE-25) 37.5-25 MG tablet TAKE 1 TABLET BY MOUTH EVERY MORNING   warfarin (COUMADIN) 5 MG tablet TAKE AS DIRECTED BY COUMADIN CLINIC   No facility-administered encounter medications on file as of 06/29/2020.    Allergies (verified) Codeine   History: Past Medical History:  Diagnosis Date   AVD (aortic valve disease) 02/25/2010   sees [name]  Bilateral carotid artery stenosis 07/10/2016   Carotid US 10/2018: Bilateral ICA 1-39; right subclavian 1.7 cm (no change since 03/2017) // Carotid US 10/21: Bilat ICA 1-39   External hemorrhoids without mention of complication    Heart valve replaced by other means    Long term (current) use of anticoagulants 09/15/2010   Meniere's disease    Other and unspecified hyperlipidemia    Other constipation    Parkinson disease ([place]) 02/2016   no meds, sees [name]    Personal history of colonic  polyps    Personal history of malignant neoplasm of prostate    Right inguinal hernia 11/18/2016   Shingles    SHINGLES 11/26/2009   Thoracic aneurysm without mention of rupture    followed by [name] with yearly CT scans    Unspecified essential hypertension    Past Surgical History:  Procedure Laterality Date   AORTIC VALVE REPLACEMENT  1991    st. jude's valve, per [name]    CATARACT EXTRACTION Left 12/2018   COLONOSCOPY  06-28-10   per Dr. Henrene Pastor, benign polyps, no further scopes needed    [place] Right 11/18/2016   Procedure: OPEN REPAIR RIGHT INGUINAL HERNIA WITH MESH;  Surgeon: Fanny Skates, MD;  Location: [facility];  Service: General;  Laterality: Right;   INSERTION OF MESH Right 11/18/2016   Procedure: INSERTION OF MESH;  Surgeon: Fanny Skates, MD;  Location: [facility];  Service: General;  Laterality: Right;   PROSTATECTOMY  1995   per Dr. Roni Bread    TONSILLECTOMY     Family History  Problem Relation Age of Onset   Heart attack Father 45       fatal at 30   Hypertension Father    CAD Father    Other Father        possible PD as says had "palsy"   Prostate cancer Brother    Diabetes Brother    Heart attack Mother    Diabetes Brother    Stroke Brother    Heart disease Brother        carotid artery disease   Hypertension Brother    Breast cancer Sister        mastectomy at 74   Colon cancer Neg Hx    Social History   Socioeconomic History   Marital status: Married    Spouse name: Not on file   Number of children: 0   Years of education: 13   Highest education level: Not on file  Occupational History   Occupation: retired    Comment: [employer], production  Tobacco Use   Smoking status: Former    Packs/day: 0.50    Years: 10.00    Pack years: 5.00    Types: Cigarettes    Quit date: 02/06/1972    Years since quitting: 48.4   Smokeless tobacco: Never  Vaping Use    Vaping Use: Never used  Substance and Sexual Activity   Alcohol use: Yes    Alcohol/week: 7.0 standard drinks    Types: 7 Standard drinks or equivalent per week    Comment: glass of wine daily; just about    Drug use: No   Sexual activity: Never  Other Topics Concern   Not on file  Social History Narrative   HSG, a couple of years of college. Married '78 - . No children. Work - [employer] 22 years - retired.  Lives alone with wife and two dogs. ACP - Yes CPR, Yes - short-term mechanical ventilation. Does not want prolonged heroic measures in the face of irreversible disease. HCPOA - wife. Alternative is his brother: Amardeep [facility].       Right handed   Two story home   Drinks caffeine   Social Determinants of Health   Financial Resource Strain: Low Risk    Difficulty of Paying Living Expenses: Not hard at all  Food Insecurity: Not on file  Transportation Needs: No Transportation Needs   Lack of Transportation (Medical): No   Lack of Transportation (Non-Medical): No  Physical Activity: Not on file  Stress: Not on file  Social Connections: Not on file    Tobacco Counseling Counseling given: Not Answered   Clinical Intake:                 Diabetic?no         Activities of Daily Living No flowsheet data found.  Patient Care Team: [name], MD as PCP - General (Family Medicine) Sherren Mocha, MD as PCP - Cardiology (Cardiology) Grace Isaac, MD (Inactive) as Consulting Physician (Cardiothoracic Surgery) Sharyne Peach, MD as Consulting Physician (Ophthalmology) Druscilla Brownie, MD as Consulting Physician (Dermatology) Irine Seal, MD as Consulting Physician (Urology) Larey Dresser, MD as Consulting Physician (Cardiology) Viona Gilmore, [facility] as Pharmacist (Pharmacist) Tat, Eustace Quail, DO as Consulting Physician (Neurology)  Indicate any recent Medical Services you may have received from other than [facility] providers in the  past year (date may be approximate).     Assessment:   This is a routine wellness examination for David Benton.  Hearing/Vision screen No results found.  Dietary issues and exercise activities discussed:     Goals Addressed   None    Depression Screen PHQ 2/9 Scores 04/25/2019 02/23/2018 05/24/2017 05/09/2017 04/21/2015 05/22/2014  PHQ - 2 Score 0 0 0 0 0 0    Fall Risk Fall Risk  02/21/2020 10/01/2019 07/29/2019 01/22/2019 07/13/2018  Falls in the past year? 0 0 0 0 0  Number falls in past yr: 0 0 0 0 0  Injury with Fall? 0 0 0 0 0  Follow up - - - Falls evaluation completed -    FALL RISK PREVENTION PERTAINING TO THE HOME:  Any stairs in or around the home? Yes  If so, are there any without handrails? No  Home free of loose throw rugs in walkways, pet beds, electrical cords, etc? Yes  Adequate lighting in your home to reduce risk of falls? Yes   ASSISTIVE DEVICES UTILIZED TO PREVENT FALLS:  Life alert? No  Use of a cane, walker or w/c? No  Grab bars in the bathroom? No  Shower chair or bench in shower? No  Elevated toilet seat or a handicapped toilet? No    Cognitive Function: Normal cognitive status assessed by direct observation by this Nurse Health Advisor. No abnormalities found.   MMSE - Mini Mental State Exam 05/24/2017  Not completed: (No Data)        Immunizations Immunization History  Administered Date(s) Administered   Fluad Quad(high Dose 65+) 10/16/2018, 10/11/2019   Influenza Split 10/09/2013, 10/08/2014, 10/10/2016   Influenza Whole 11/10/2008, 10/10/2011   Influenza, High Dose Seasonal PF 09/21/2017   Influenza-Unspecified 09/17/2012, 10/08/2014, 10/21/2015   PFIZER(Purple Top)SARS-COV-2 Vaccination 01/24/2019, 02/13/2019, 11/08/2019   Pneumococcal Conjugate-13 03/07/2013   Pneumococcal Polysaccharide-23 01/19/2003   Td 01/19/2003   Tdap 05/21/2013   Zoster  Recombinat (Shingrix) 02/23/2018, 06/26/2018    TDAP status: Up to date  Flu Vaccine status: Up to  date  Pneumococcal vaccine status: Up to date  Covid-19 vaccine status: Completed vaccines  Qualifies for Shingles Vaccine? Yes   Zostavax completed No   Shingrix Completed?: No.    Education has been provided regarding the importance of this vaccine. Patient has been advised to call insurance company to determine out of pocket expense if they have not yet received this vaccine. Advised may also receive vaccine at local pharmacy or Health Dept. Verbalized acceptance and understanding.  Screening Tests Health Maintenance  Topic Date Due   COVID-19 Vaccine (4 - Booster for Pfizer series) 03/10/2020   INFLUENZA VACCINE  08/17/2020   TETANUS/TDAP  05/22/2023   PNA vac Low Risk Adult  Completed   Zoster Vaccines- Shingrix  Completed   HPV VACCINES  Aged Out    Health Maintenance  Health Maintenance Due  Topic Date Due   COVID-19 Vaccine (4 - Booster for Pfizer series) 03/10/2020    Colorectal cancer screening: No longer required.   Lung Cancer Screening: (Low Dose CT Chest recommended if Age 19-80 years, 30 pack-year currently smoking OR have quit w/in 15years.) does not qualify.   Lung Cancer Screening Referral: n/a  Additional Screening:  Hepatitis C Screening: does not qualify  Vision Screening: Recommended annual ophthalmology exams for early detection of glaucoma and other disorders of the eye. Is the patient up to date with their annual eye exam?  Yes  Who is the provider or what is the name of the office in which the patient attends annual eye exams? Dr.Gould  If pt is not established with a provider, would they like to be referred to a provider to establish care? No .   Dental Screening: Recommended annual dental exams for proper oral hygiene  Community Resource Referral / Chronic Care Management: CRR required this visit?  No   CCM required this visit?  No      Plan:     I have personally reviewed and noted the following in the patient's chart:   Medical  and social history Use of alcohol, tobacco or illicit drugs  Current medications and supplements including opioid prescriptions. Patient is not currently taking opioid prescriptions. Functional ability and status Nutritional status Physical activity Advanced directives List of other physicians Hospitalizations, surgeries, and ER visits in previous 12 months Vitals Screenings to include cognitive, depression, and falls Referrals and appointments  In addition, I have reviewed and discussed with patient certain preventive protocols, quality metrics, and best practice recommendations. A written personalized care plan for preventive services as well as general preventive health recommendations were provided to patient.     Randel Pigg, LPN   [id number]/61/0960   Nurse Notes: none

## 2020-06-29 NOTE — Patient Instructions (Signed)
David Benton , Thank you for taking time to come for your Medicare Wellness Visit. I appreciate your ongoing commitment to your health goals. Please review the following plan we discussed and let me know if I can assist you in the future.   Screening recommendations/referrals: Colonoscopy: no longer required Recommended yearly ophthalmology/optometry visit for glaucoma screening and checkup Recommended yearly dental visit for hygiene and checkup  Vaccinations: Influenza vaccine: current due in the fall  Pneumococcal vaccine: completed series Tdap vaccine: current 05/21/2013 current due 2025  Shingles vaccine: completed series  Advanced directives: will provide copies   Conditions/risks identified: none   Next appointment: none   Preventive Care 65 Years and Older, Male Preventive care refers to lifestyle choices and visits with your health care provider that can promote health and wellness. What does preventive care include? A yearly physical exam. This is also called an annual well check. Dental exams once or twice a year. Routine eye exams. Ask your health care provider how often you should have your eyes checked. Personal lifestyle choices, including: Daily care of your teeth and gums. Regular physical activity. Eating a healthy diet. Avoiding tobacco and drug use. Limiting alcohol use. Practicing safe sex. Taking low doses of aspirin every day. Taking vitamin and mineral supplements as recommended by your health care provider. What happens during an annual well check? The services and screenings done by your health care provider during your annual well check will depend on your age, overall health, lifestyle risk factors, and family history of disease. Counseling  Your health care provider may ask you questions about your: Alcohol use. Tobacco use. Drug use. Emotional well-being. Home and relationship well-being. Sexual activity. Eating habits. History of  falls. Memory and ability to understand (cognition). Work and work Statistician. Screening  You may have the following tests or measurements: Height, weight, and BMI. Blood pressure. Lipid and cholesterol levels. These may be checked every 5 years, or more frequently if you are over 43 years old. Skin check. Lung cancer screening. You may have this screening every year starting at age 21 if you have a 30-pack-year history of smoking and currently smoke or have quit within the past 15 years. Fecal occult blood test (FOBT) of the stool. You may have this test every year starting at age 32. Flexible sigmoidoscopy or colonoscopy. You may have a sigmoidoscopy every 5 years or a colonoscopy every 10 years starting at age 70. Prostate cancer screening. Recommendations will vary depending on your family history and other risks. Hepatitis C blood test. Hepatitis B blood test. Sexually transmitted disease (STD) testing. Diabetes screening. This is done by checking your blood sugar (glucose) after you have not eaten for a while (fasting). You may have this done every 1-3 years. Abdominal aortic aneurysm (AAA) screening. You may need this if you are a current or former smoker. Osteoporosis. You may be screened starting at age 39 if you are at high risk. Talk with your health care provider about your test results, treatment options, and if necessary, the need for more tests. Vaccines  Your health care provider may recommend certain vaccines, such as: Influenza vaccine. This is recommended every year. Tetanus, diphtheria, and acellular pertussis (Tdap, Td) vaccine. You may need a Td booster every 10 years. Zoster vaccine. You may need this after age 98. Pneumococcal 13-valent conjugate (PCV13) vaccine. One dose is recommended after age 57. Pneumococcal polysaccharide (PPSV23) vaccine. One dose is recommended after age 35. Talk to your health care provider about  which screenings and vaccines you need and  how often you need them. This information is not intended to replace advice given to you by your health care provider. Make sure you discuss any questions you have with your health care provider. Document Released: 01/30/2015 Document Revised: 09/23/2015 Document Reviewed: 11/04/2014 Elsevier Interactive Patient Education  2017 [employer] Prevention in the Home Falls can cause injuries. They can happen to people of all ages. There are many things you can do to make your home safe and to help prevent falls. What can I do on the outside of my home? Regularly fix the edges of walkways and driveways and fix any cracks. Remove anything that might make you trip as you walk through a door, such as a raised step or threshold. Trim any bushes or trees on the path to your home. Use bright outdoor lighting. Clear any walking paths of anything that might make someone trip, such as rocks or tools. Regularly check to see if handrails are loose or broken. Make sure that both sides of any steps have handrails. Any raised decks and porches should have guardrails on the edges. Have any leaves, snow, or ice cleared regularly. Use sand or salt on walking paths during winter. Clean up any spills in your garage right away. This includes oil or grease spills. What can I do in the bathroom? Use night lights. Install grab bars by the toilet and in the tub and shower. Do not use towel bars as grab bars. Use non-skid mats or decals in the tub or shower. If you need to sit down in the shower, use a plastic, non-slip stool. Keep the floor dry. Clean up any water that spills on the floor as soon as it happens. Remove soap buildup in the tub or shower regularly. Attach bath mats securely with double-sided non-slip rug tape. Do not have throw rugs and other things on the floor that can make you trip. What can I do in the bedroom? Use night lights. Make sure that you have a light by your bed that is easy to  reach. Do not use any sheets or blankets that are too big for your bed. They should not hang down onto the floor. Have a firm chair that has side arms. You can use this for support while you get dressed. Do not have throw rugs and other things on the floor that can make you trip. What can I do in the kitchen? Clean up any spills right away. Avoid walking on wet floors. Keep items that you use a lot in easy-to-reach places. If you need to reach something above you, use a strong step stool that has a grab bar. Keep electrical cords out of the way. Do not use floor polish or wax that makes floors slippery. If you must use wax, use non-skid floor wax. Do not have throw rugs and other things on the floor that can make you trip. What can I do with my stairs? Do not leave any items on the stairs. Make sure that there are handrails on both sides of the stairs and use them. Fix handrails that are broken or loose. Make sure that handrails are as long as the stairways. Check any carpeting to make sure that it is firmly attached to the stairs. Fix any carpet that is loose or worn. Avoid having throw rugs at the top or bottom of the stairs. If you do have throw rugs, attach them to the floor with  carpet tape. Make sure that you have a light switch at the top of the stairs and the bottom of the stairs. If you do not have them, ask someone to add them for you. What else can I do to help prevent falls? Wear shoes that: Do not have high heels. Have rubber bottoms. Are comfortable and fit you well. Are closed at the toe. Do not wear sandals. If you use a stepladder: Make sure that it is fully opened. Do not climb a closed stepladder. Make sure that both sides of the stepladder are locked into place. Ask someone to hold it for you, if possible. Clearly mark and make sure that you can see: Any grab bars or handrails. First and last steps. Where the edge of each step is. Use tools that help you move  around (mobility aids) if they are needed. These include: Canes. Walkers. Scooters. Crutches. Turn on the lights when you go into a dark area. Replace any light bulbs as soon as they burn out. Set up your furniture so you have a clear path. Avoid moving your furniture around. If any of your floors are uneven, fix them. If there are any pets around you, be aware of where they are. Review your medicines with your doctor. Some medicines can make you feel dizzy. This can increase your chance of falling. Ask your doctor what other things that you can do to help prevent falls. This information is not intended to replace advice given to you by your health care provider. Make sure you discuss any questions you have with your health care provider. Document Released: 10/30/2008 Document Revised: 06/11/2015 Document Reviewed: 02/07/2014 Elsevier Interactive Patient Education  2017 [employer].

## 2020-07-01 ENCOUNTER — Other Ambulatory Visit: Payer: Self-pay

## 2020-07-01 ENCOUNTER — Ambulatory Visit: Payer: Medicare Other | Admitting: *Deleted

## 2020-07-01 DIAGNOSIS — Z5181 Encounter for therapeutic drug level monitoring: Secondary | ICD-10-CM

## 2020-07-01 LAB — POCT INR: INR: 3.1 — AB (ref 2.0–3.0)

## 2020-07-01 NOTE — Patient Instructions (Signed)
Description   Continue taking Warfarin 1.5 tablets every day except 2 tablets on Saturdays. [facility] # [phone]. Call with any concerns  Recheck INR in 6 weeks.

## 2020-07-08 ENCOUNTER — Telehealth: Payer: Self-pay | Admitting: Pharmacist

## 2020-07-08 NOTE — Chronic Care Management (AMB) (Addendum)
Chronic Care Management Pharmacy Assistant   Name: David Benton  MRN: [id number] DOB: 1929/09/17  Reason for Encounter: Disease State   Conditions to be addressed/monitored: HTN  Recent office visits:  06.13.2022 Randel Pigg, LPN Medicare Wellness Exam  Recent consult visits:  06.09.2022 Criselda Peaches, DPM Podiatry: follow-up visit seen for [facility] visits:  None in previous 6 months  Medications: Outpatient Encounter Medications as of 07/08/2020  Medication Sig   ACETAMINOPHEN PO Take 650 mg by mouth every 6 (six) hours as needed for pain.    amoxicillin (AMOXIL) 500 MG capsule Take 4 capsules (2,000 mg) one hour prior to all dental visits.   atorvastatin (LIPITOR) 40 MG tablet TAKE 1 TABLET BY MOUTH EVERY DAY   betamethasone dipropionate 0.05 % cream APPLY TO AFFECTED AREA OF SKIN UP TO 2 TIMES DAILY AS NEEDED. DO NOT APPLY TO FACE, GROIN, OR AXILLA   carbidopa-levodopa (SINEMET IR) 25-100 MG tablet TAKE 1 TABLET BY MOUTH 3 TIMES DAILY   levothyroxine (SYNTHROID) 75 MCG tablet TAKE 1 TABLET BY MOUTH EVERY DAY   magnesium hydroxide (MILK OF MAGNESIA) 400 MG/5ML suspension Take 30 mLs by mouth daily as needed. Reported on 04/21/2015   meclizine (ANTIVERT) 25 MG tablet Take 1 tablet (25 mg total) by mouth every 4 (four) hours as needed for dizziness. (Patient not taking: Reported on 06/29/2020)   Probiotic Product (PROBIOTIC-10 ULTIMATE) CAPS Take 1 capsule by mouth daily.   saline (AYR) GEL Place 1 application into the nose in the morning and at bedtime.    sodium chloride (OCEAN) 0.65 % SOLN nasal spray Place 1 spray into both nostrils as needed for congestion. Reported on 04/21/2015   TOLAK 4 % CREA Apply 1 a small amount to affected area once a day  Apply to scalp and arms    RX 2 Tubes (Patient not taking: Reported on 06/29/2020)   triamterene-hydrochlorothiazide (MAXZIDE-25) 37.5-25 MG tablet TAKE 1 TABLET BY MOUTH EVERY MORNING   warfarin (COUMADIN) 5 MG tablet  TAKE AS DIRECTED BY COUMADIN CLINIC   No facility-administered encounter medications on file as of 07/08/2020.   Reviewed chart prior to disease state call. Spoke with patient regarding BP  Recent Office Vitals: BP Readings from Last 3 Encounters:  05/05/20 110/60  02/21/20 120/82  10/11/19 110/60   Pulse Readings from Last 3 Encounters:  05/05/20 61  02/21/20 74  10/11/19 65    Wt Readings from Last 3 Encounters:  05/05/20 141 lb 12.8 oz (64.3 kg)  02/21/20 141 lb (64 kg)  10/11/19 139 lb (63 kg)     Kidney Function Lab Results  Component Value Date/Time   CREATININE 0.97 05/05/2020 08:56 AM   CREATININE 0.96 04/25/2019 10:26 AM   CREATININE 1.09 07/30/2014 02:00 PM   CREATININE 1.07 07/30/2013 09:13 AM   GFR 69.58 05/05/2020 08:56 AM   GFRNONAA 59 (L) 11/17/2016 10:26 AM   GFRAA >60 11/17/2016 10:26 AM    BMP Latest Ref Rng & Units 05/05/2020 04/25/2019 02/26/2018  Glucose 70 - 99 mg/dL 99 106(H) 102(H)  BUN 6 - 23 mg/dL 25(H) 19 21  Creatinine 0.40 - 1.50 mg/dL 0.97 0.96 0.97  BUN/Creat Ratio 10 - 24 - - -  Sodium 135 - 145 mEq/L 138 138 139  Potassium 3.5 - 5.1 mEq/L 4.5 3.8 4.2  Chloride 96 - 112 mEq/L 102 100 100  CO2 19 - 32 mEq/L 32 32 32  Calcium 8.4 - 10.5 mg/dL 9.1 9.4 9.6  Current antihypertensive regimen:  Triamterene /HCTZ 37.5-25 mg: one tablet every morning How often are you checking your Blood Pressure? when feeling symptomatic Current home BP readings: None What recent interventions/DTPs have been made by any provider to improve Blood Pressure control since last CPP Visit: None Any recent hospitalizations or ED visits since last visit with CPP? No What diet changes have been made to improve Blood Pressure Control?  No Change What exercise is being done to improve your Blood Pressure Control?  No Change  Adherence Review: Is the patient currently on ACE/ARB medication? No Does the patient have >5 day gap between last estimated fill dates?  No  I spoke with the patient about medication adherence. He stated that he has been doing well. He has not been able to take his blood pressure readings recently. He states that he was having trouble with his cuff. He stated that he will bring his cuff to his next doctor's appointment so that they can help him with it and show him how to take it properly. He continues to take his medication as prescribed. He states he has not had any falls or injuries. There have been no recent changes to his medications. He states that he is not experiencing any side effects from his medications. There has been no hospital, emergency department, or urgent care business since his last CPP or PCP visit. He currently is not having any issues with his pharmacy. His next CCM appointment is scheduled for October 2022.  Star Rating Drugs: Medication Dispensed  Quantity Pharmacy  Atorvastatin 40 mg 05.31.2022 Te[place]y (Oswaldo Milian) Mare Ferrari, [place] Pharmacist Assistant [phone]

## 2020-07-21 ENCOUNTER — Other Ambulatory Visit: Payer: Self-pay | Admitting: Family Medicine

## 2020-08-12 ENCOUNTER — Ambulatory Visit: Payer: Medicare Other | Admitting: *Deleted

## 2020-08-12 ENCOUNTER — Other Ambulatory Visit: Payer: Self-pay

## 2020-08-12 DIAGNOSIS — Z5181 Encounter for therapeutic drug level monitoring: Secondary | ICD-10-CM | POA: Diagnosis not present

## 2020-08-12 LAB — POCT INR: INR: 3 (ref 2.0–3.0)

## 2020-08-12 NOTE — Patient Instructions (Signed)
Description   Continue taking Warfarin 1.5 tablets every day except 2 tablets on Saturdays. Coumadin Clinic # [phone]. Call with any concerns  Recheck INR in 6 weeks.

## 2020-08-19 ENCOUNTER — Other Ambulatory Visit: Payer: Self-pay | Admitting: Neurology

## 2020-08-19 ENCOUNTER — Other Ambulatory Visit: Payer: Self-pay | Admitting: Internal Medicine

## 2020-08-19 NOTE — Telephone Encounter (Signed)
Refill request

## 2020-08-27 ENCOUNTER — Other Ambulatory Visit: Payer: Self-pay | Admitting: Surgery

## 2020-08-27 DIAGNOSIS — I712 Thoracic aortic aneurysm, without rupture, unspecified: Secondary | ICD-10-CM

## 2020-09-02 NOTE — Progress Notes (Signed)
Assessment/Plan:   1.  Parkinsons Disease  -Continue carbidopa/levodopa 25/100, 1 tablet 3 times per day.  He looks great!  Still on starting dose and dx in 2018  -He does follow regularly with dermatology.  2.  History of aortic valve replacement  -On Coumadin.  Follows with cardiology Subjective:   David Benton was seen today in follow up for Parkinsons disease.  My previous records were reviewed prior to todays visit as well as outside records available to me.  No falls.  Mood is good.  No lightheadedness or near syncope.  Continues to attend rock steady boxing.  Also does a zoom strength class.    Current prescribed movement disorder medications: Carbidopa/levodopa 25/100, 1 tablet 3 times daily.    ALLERGIES:   Allergies  Allergen Reactions   Codeine     REACTION: N/V    CURRENT MEDICATIONS:  Outpatient Encounter Medications as of 09/04/2020  Medication Sig   ACETAMINOPHEN PO Take 650 mg by mouth every 6 (six) hours as needed for pain.    amoxicillin (AMOXIL) 500 MG capsule Take 4 capsules (2,000 mg) one hour prior to all dental visits.   atorvastatin (LIPITOR) 40 MG tablet TAKE 1 TABLET BY MOUTH EVERY DAY   betamethasone dipropionate 0.05 % cream APPLY TO AFFECTED AREA OF SKIN UP TO 2 TIMES DAILY AS NEEDED. DO NOT APPLY TO FACE, GROIN, OR AXILLA   carbidopa-levodopa (SINEMET IR) 25-100 MG tablet TAKE 1 TABLET BY MOUTH 3 TIMES DAILY   levothyroxine (SYNTHROID) 75 MCG tablet TAKE 1 TABLET BY MOUTH ONCE DAILY   magnesium hydroxide (MILK OF MAGNESIA) 400 MG/5ML suspension Take 30 mLs by mouth daily as needed. Reported on 04/21/2015   Probiotic Product (PROBIOTIC-10 ULTIMATE) CAPS Take 1 capsule by mouth daily.   saline (AYR) GEL Place 1 application into the nose in the morning and at bedtime.    sodium chloride (OCEAN) 0.65 % SOLN nasal spray Place 1 spray into both nostrils as needed for congestion. Reported on 04/21/2015   triamterene-hydrochlorothiazide (MAXZIDE-25)  37.5-25 MG tablet TAKE 1 TABLET BY MOUTH EVERY MORNING   warfarin (COUMADIN) 5 MG tablet TAKE AS DIRECTED BY COUMADIN CLINIC   [DISCONTINUED] meclizine (ANTIVERT) 25 MG tablet Take 1 tablet (25 mg total) by mouth every 4 (four) hours as needed for dizziness. (Patient not taking: No sig reported)   [DISCONTINUED] TOLAK 4 % CREA Apply 1 a small amount to affected area once a day  Apply to scalp and arms    RX 2 Tubes (Patient not taking: Reported on 06/29/2020)   No facility-administered encounter medications on file as of 09/04/2020.    Objective:   PHYSICAL EXAMINATION:    VITALS:   Vitals:   09/04/20 0902  BP: (!) 159/79  Pulse: 74  Resp: (!) 97  Weight: 143 lb 6.4 oz (65 kg)  Height: '5\' 10"'$  (1.778 m)     GEN:  The patient appears stated age and is in NAD. HEENT:  Normocephalic, atraumatic.  The mucous membranes are moist. The superficial temporal arteries are without ropiness or tenderness. CV:  RRR Lungs:  CTAB Neck/HEME:  There are no carotid bruits bilaterally.  Neurological examination:  Orientation: The patient is alert and oriented x3. Cranial nerves: There is good facial symmetry with facial hypomimia. The speech is fluent and clear. Soft palate rises symmetrically and there is no tongue deviation. Hearing is intact to conversational tone. Sensation: Sensation is intact to light touch throughout Motor: Strength is at  least antigravity x4.  Movement examination: Tone: There is nl tone in the UE/LE Abnormal movements: there is L>RUE tremor Coordination:  There is no decremation with RAM's, with any form of RAMS, including alternating supination and pronation of the forearm, hand opening and closing, finger taps, heel taps and toe taps. Gait and Station: The patient has no difficulty arising out of a deep-seated chair without the use of the hands. The patient's stride length is good.   I have reviewed and interpreted the following labs independently    Chemistry       Component Value Date/Time   NA 138 05/05/2020 0856   NA 142 07/08/2016 1449   K 4.5 05/05/2020 0856   CL 102 05/05/2020 0856   CO2 32 05/05/2020 0856   BUN 25 (H) 05/05/2020 0856   BUN 26 07/08/2016 1449   CREATININE 0.97 05/05/2020 0856   CREATININE 1.09 07/30/2014 1400      Component Value Date/Time   CALCIUM 9.1 05/05/2020 0856   ALKPHOS 102 05/05/2020 0856   AST 22 05/05/2020 0856   ALT 5 05/05/2020 0856   BILITOT 1.4 (H) 05/05/2020 0856   BILITOT 1.7 (H) 07/08/2016 1449       Lab Results  Component Value Date   WBC 4.1 05/05/2020   HGB 13.5 05/05/2020   HCT 39.1 05/05/2020   MCV 88.1 05/05/2020   PLT 200.0 05/05/2020    Lab Results  Component Value Date   TSH 0.85 05/05/2020     Total time spent on today's visit was 20 minutes, including both face-to-face time and nonface-to-face time.  Time included that spent on review of records (prior notes available to me/labs/imaging if pertinent), discussing treatment and goals, answering patient's questions and coordinating care.  Cc:  [name], MD

## 2020-09-03 ENCOUNTER — Ambulatory Visit: Payer: Medicare Other | Admitting: Podiatry

## 2020-09-03 ENCOUNTER — Telehealth: Payer: Self-pay | Admitting: Pharmacist

## 2020-09-03 ENCOUNTER — Other Ambulatory Visit: Payer: Self-pay

## 2020-09-03 DIAGNOSIS — M7741 Metatarsalgia, right foot: Secondary | ICD-10-CM | POA: Diagnosis not present

## 2020-09-03 DIAGNOSIS — M21969 Unspecified acquired deformity of unspecified lower leg: Secondary | ICD-10-CM | POA: Diagnosis not present

## 2020-09-03 DIAGNOSIS — L84 Corns and callosities: Secondary | ICD-10-CM | POA: Diagnosis not present

## 2020-09-03 DIAGNOSIS — M7742 Metatarsalgia, left foot: Secondary | ICD-10-CM | POA: Diagnosis not present

## 2020-09-03 NOTE — Progress Notes (Signed)
Chronic Care Management Pharmacy Assistant   Name: David Benton  MRN: [id number] DOB: February 24, 1929  Reason for Encounter: Disease State Hypertension Assessment Call   Conditions to be addressed/monitored: HTN  Recent office visits:  None  Recent consult visits:  7-272022 - Patient presented for Anti Coag visit (3.0). Warfarin adjusted  Hospital visits:  None in previous 6 months  Medications: Outpatient Encounter Medications as of 09/03/2020  Medication Sig   ACETAMINOPHEN PO Take 650 mg by mouth every 6 (six) hours as needed for pain.    amoxicillin (AMOXIL) 500 MG capsule Take 4 capsules (2,000 mg) one hour prior to all dental visits.   atorvastatin (LIPITOR) 40 MG tablet TAKE 1 TABLET BY MOUTH EVERY DAY   betamethasone dipropionate 0.05 % cream APPLY TO AFFECTED AREA OF SKIN UP TO 2 TIMES DAILY AS NEEDED. DO NOT APPLY TO FACE, GROIN, OR AXILLA   carbidopa-levodopa (SINEMET IR) 25-100 MG tablet TAKE 1 TABLET BY MOUTH 3 TIMES DAILY   levothyroxine (SYNTHROID) 75 MCG tablet TAKE 1 TABLET BY MOUTH ONCE DAILY   magnesium hydroxide (MILK OF MAGNESIA) 400 MG/5ML suspension Take 30 mLs by mouth daily as needed. Reported on 04/21/2015   meclizine (ANTIVERT) 25 MG tablet Take 1 tablet (25 mg total) by mouth every 4 (four) hours as needed for dizziness. (Patient not taking: Reported on 06/29/2020)   Probiotic Product (PROBIOTIC-10 ULTIMATE) CAPS Take 1 capsule by mouth daily.   saline (AYR) GEL Place 1 application into the nose in the morning and at bedtime.    sodium chloride (OCEAN) 0.65 % SOLN nasal spray Place 1 spray into both nostrils as needed for congestion. Reported on 04/21/2015   TOLAK 4 % CREA Apply 1 a small amount to affected area once a day  Apply to scalp and arms    RX 2 Tubes (Patient not taking: Reported on 06/29/2020)   triamterene-hydrochlorothiazide (MAXZIDE-25) 37.5-25 MG tablet TAKE 1 TABLET BY MOUTH EVERY MORNING   warfarin (COUMADIN) 5 MG tablet TAKE AS DIRECTED BY  COUMADIN CLINIC   No facility-administered encounter medications on file as of 09/03/2020.  Reviewed chart prior to disease state call. Spoke with patient regarding BP  Recent Office Vitals: BP Readings from Last 3 Encounters:  05/05/20 110/60  02/21/20 120/82  10/11/19 110/60   Pulse Readings from Last 3 Encounters:  05/05/20 61  02/21/20 74  10/11/19 65    Wt Readings from Last 3 Encounters:  05/05/20 141 lb 12.8 oz (64.3 kg)  02/21/20 141 lb (64 kg)  10/11/19 139 lb (63 kg)     Kidney Function Lab Results  Component Value Date/Time   CREATININE 0.97 05/05/2020 08:56 AM   CREATININE 0.96 04/25/2019 10:26 AM   CREATININE 1.09 07/30/2014 02:00 PM   CREATININE 1.07 07/30/2013 09:13 AM   GFR 69.58 05/05/2020 08:56 AM   GFRNONAA 59 (L) 11/17/2016 10:26 AM   GFRAA >60 11/17/2016 10:26 AM    BMP Latest Ref Rng & Units 05/05/2020 04/25/2019 02/26/2018  Glucose 70 - 99 mg/dL 99 106(H) 102(H)  BUN 6 - 23 mg/dL 25(H) 19 21  Creatinine 0.40 - 1.50 mg/dL 0.97 0.96 0.97  BUN/Creat Ratio 10 - 24 - - -  Sodium 135 - 145 mEq/L 138 138 139  Potassium 3.5 - 5.1 mEq/L 4.5 3.8 4.2  Chloride 96 - 112 mEq/L 102 100 100  CO2 19 - 32 mEq/L 32 32 32  Calcium 8.4 - 10.5 mg/dL 9.1 9.4 9.6    Current  antihypertensive regimen:  Triamterene/HCTZ 37.5-25 mg 1 tablet every morning How often are you checking your Blood Pressure? Patient reports he has not been checking his blood pressures at home. Her reports he is unsure of how to work the machine. Current home BP readings: Recent office visit with Neuro 157/79 What recent interventions/DTPs have been made by any provider to improve Blood Pressure control since last CPP Visit: Patient reports none Any recent hospitalizations or ED visits since last visit with CPP? No What diet changes have been made to improve Blood Pressure Control?  Patient reports his wife does all of the cooing. For breakfast he will have cereal,granola, and bread like english  muffin, pancake, waffle or scone. For lunch he will have soup and toast or sandwich and yogurt with fruit. For dinner he has chicken or fish very little red meat and veggies and a fruit. He is drinking water but not as much as he thinks he should. What exercise is being done to improve your Blood Pressure Control?  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 He reports in his spare time he does go for walks.  Adherence Review: Is the patient currently on ACE/ARB medication? Yes Does the patient have >5 day gap between last estimated fill dates? No  Notes: Advised patient that his office reading with Dr Tat this month was a bit on the higher side. He notes that he has a wrist cuff he isn't aware on how to operate, He states Dr Tat informed him that he should have it checked at the pharmacy or [place] and bring with him his cuff so they can show him how to use it, he stated he would be planning to do so soon, had gone but forgot to bring his cuff. I advised him I would check back in with him in a few days to see what the outcome was and for a home reading, he was in agreement.  Care Gaps: CCM F/U call - 11-06-20 10 am AWV - Done 06-29-2020 COVID Booster #4 [employer]) - Overdue Flu Vaccine - Overdue  Star Rating Drugs: Atorvastatin (Lipitor) 40 mg - Last filled 08-13-2020 at [place]  Notes: Attempted to reach on 09-03-2020 left VM  [place] Clinical Pharmacist Assistant [phone]

## 2020-09-04 ENCOUNTER — Ambulatory Visit: Payer: Medicare Other | Admitting: Neurology

## 2020-09-04 ENCOUNTER — Encounter: Payer: Self-pay | Admitting: Neurology

## 2020-09-04 VITALS — BP 159/79 | HR 74 | Resp 97 | Ht 70.0 in | Wt 143.4 lb

## 2020-09-04 DIAGNOSIS — G20 Parkinson's disease: Secondary | ICD-10-CM | POA: Diagnosis not present

## 2020-09-04 MED ORDER — CARBIDOPA-LEVODOPA 25-100 MG PO TABS
1.0000 | ORAL_TABLET | Freq: Three times a day (TID) | ORAL | 2 refills | Status: DC
Start: 2020-09-04 — End: 2020-09-16

## 2020-09-04 NOTE — Patient Instructions (Signed)
Online Resources for [employer] over [employer]   [employer] Online Groups  [employer] over [employer] :   Power Over Parkinson's Patient Education Group will be Wednesday, August 10th-*Hybrid meting*- in person at [employer] location and via [place] at 2:30 pm.  PLEASE NOTE [place] over Parkinson's Meetings:  2nd Wednesdays of the month at 2 pm Contact Amy Marriott at [email or web address] if interested in participating in this online group Parkinson's Care Partners Group:    3rd Mondays, Contact Misty Paladino Atypical Parkinsonian Patient Group:   4th Wednesdays, [place] If you are interested in participating in these online groups with Misty, please contact her directly for how to join those meetings.  Her contact information is [email or web address].   [place]:  [email or web address] PD Health at Home continues:  Mindfulness Mondays, Expert Briefing Tuesdays, Wellness Wednesdays, Take Time Thursdays, Fitness Fridays -Listings for June 2022 are on the website Wellness Wednesday:  [place] for Parkinson's Disease through [employer].  Wednesday, August 3rd at 1 pm. Upcoming Webinar:  Use it or Lose It-The Impact of Physical Activity in Parkinson's.  Wednesday, September 7th at 1 pm. Register for Armed forces operational officer) at [email or web address]  Please check out their website to sign up for emails and see their full online offerings  [place]:  [email or web address]  Upcoming Webinar[address] and You:  How to become a Barrister's clerk.  Thursday, August 18th at 12 pm  Check out additional information on their website to see their full online offerings  [place]:  [email or web address] Upcoming Webinar:  Depression, Anxiety, Mood and Parkinson's.   Tuesday, August 9th  at 4 pm. Care Partner Monthly Meetup.  With  12-12-1972 [place].  First Tuesday of each month, 2 pm Joy Breaks:  First Wednesday of each month, 2-3 pm. There will be art, doodling, making, crafting, listening, laughing, stories, and everything in between. No art experience necessary. No supplies required. Just show up for joy!  Register on their website. Check out additional information to Live Well Today on their website  Parkinson and Movement Disorders (PMD) Alliance:  www.pmdalliance.org [employer]Life Online:  Online Education Events Sign up for emails, which are sent weekly to give you updates on programming and online offerings  Parkinson's Association of the [place]:  www.[email or web address] Caring for Parkinson's, Caring for You Symposium, Saturday, September 10th.  [place], [place].  Symposium Registration - Parkinson Association of the [place] Information on online support groups, education events, and online exercises including Yoga, Parkinson's exercises and more-LOTS of information on links to PD resources and online events Virtual Support Group through Parkinson's Association of the [place]; next one is scheduled for Wednesday, August 3rd at 2 pm. (These are typically scheduled for the 1st Wednesday of the month at 2 pm).  Visit website for details.  Additional links for movement activities: PWR! Moves Classes at [place] RESUMED!  Wednesdays 10 and 11 am.  Contact Amy Marriott, PT [email or web address] or [phone] if interested Here is a link to the PWR!Moves classes on Zoom from [phone] - Daily Mon-Sat at 10:00. Via Zoom, FREE and open to all.  There is also a link below via Facebook if you use that platform. [place] [email or web address] Parkinson's Wellness Recovery  (PWR! Moves)  www.[email or web address] Info on the PWR! Virtual Experience:  You will have access to our expertise through self-assessment, guided plans that start with the PD-specific fundamentals, educational  content, tips, Q&A with an expert, and a growing Art therapist of PD-specific pre-recorded and live exercise classes of varying types and intensity - both physical and cognitive! If that is not enough, we offer 1:1 wellness consultations (in-person or virtual) to personalize your PWR! Research scientist (medical).  [place] Fridays:  As part of the PD Health @ Home program, this free video series focuses each week on one aspect of fitness designed to support people living with Parkinson's.  These weekly videos highlight the [place] recent fitness guidelines for people with Parkinson's disease.  [employer] Dance for PD website is offering free, live-stream classes throughout the week, as well as links to [employer] of classes:  [email or web address]/ Dance for Parkinson's Class:  [place].  Free offering for people with Parkinson's and care partners; virtual class.  For more information, contact [phone] or email Ruffin Frederick at [email or web address] Virtual dance and Pilates for Parkinson's classes: Click on the Community Tab> Parkinson's Movement Initiative Tab.  To register for classes and for more information, visit www.[email or web address] and click the "community" tab.    [employer] Parkinson's Cycling Classes  [employer]: 1pm on Fridays-Live classes at [employer] ([employer] at [email or web address] or [phone]) [employer]: Virtual Classes Mondays and Thursdays Jeanette Caprice classes Tuesday, Wednesday and Thursday (contact [place] at [email or web address]  or [phone])  [facility] Boxing Three levels of classes  are offered Tuesdays and Thursdays:  10:30 am,  12 noon & 1:45 pm at [facility].  Active Stretching with Paula Compton Class starting in March, on Fridays To observe a class or for  more information, call [phone] or email [email or web address] Well-Spring Solutions: Online Caregiver Education Opportunities:  www.well-[email or web address]/caregiver-education/caregiver-support-group.  You may also contact Vickki Muff at [email or web address] or [phone].   Well-Spring Navigator:  [phone] program, a free service to help individuals and families through the journey of determining care for older adults.  The "Navigator" is a [employer], Education officer, museum, who will speak with a prospective client and/or loved ones to provide an assessment of the situation and a set of recommendations for a personalized care plan -- all free of charge, and whether Well-Spring Solutions offers the needed service or not. If the need is not a service we provide, we are well-connected with reputable programs in town that we can refer you to.  www.well-springsolutions.org or to speak with the Navigator, call [phone].

## 2020-09-08 NOTE — Progress Notes (Signed)
  Subjective:  Patient ID: David Benton, male    DOB: 1929-03-10,  MRN: [id number]  Chief Complaint  Patient presents with   Callouses    Painful callus lesions bilateral, 9 week follow up, patient is taking Coumadin    87 y.o. male returns with the above complaint. History confirmed with patient.  He is doing quite well with the urea cream and padding.  On the right foot submet 5 B/L now and the hallux  Objective:  Physical Exam: warm, good capillary refill, no trophic changes or ulcerative lesions, normal DP and PT pulses and normal sensory exam.  Left Foot: Submet 5 large hyperkeratosis, improved since last visit in character and quality Right Foot: Submet 5 and submet 1 large hyperkeratoses, both improved and softer than previously   Assessment:   1. Metatarsalgia of both feet   2. Callus of foot   3. Deformity of metatarsal, unspecified laterality       Plan:  Patient was evaluated and treated and all questions answered.  All symptomatic hyperkeratoses were safely debrided with a sterile #15 blade to patient's level of comfort without incident. We discussed preventative and palliative care of these lesions including supportive and accommodative shoegear, padding, prefabricated and custom molded accommodative orthoses, use of a pumice stone and lotions/creams daily.  Continue use of dancers pads and urea cream 40%  We also discussed custom molded orthotics and this may help him at some point he will think about this  [name], DPM 09/08/2020    Return in about 9 weeks (around 11/05/2020) for check on calluses .

## 2020-09-16 ENCOUNTER — Other Ambulatory Visit: Payer: Self-pay | Admitting: Neurology

## 2020-09-23 ENCOUNTER — Ambulatory Visit: Payer: Medicare Other | Admitting: *Deleted

## 2020-09-23 ENCOUNTER — Other Ambulatory Visit: Payer: Self-pay

## 2020-09-23 ENCOUNTER — Telehealth: Payer: Self-pay | Admitting: Pharmacist

## 2020-09-23 DIAGNOSIS — Z5181 Encounter for therapeutic drug level monitoring: Secondary | ICD-10-CM | POA: Diagnosis not present

## 2020-09-23 LAB — POCT INR: INR: 3 (ref 2.0–3.0)

## 2020-09-23 NOTE — Chronic Care Management (AMB) (Signed)
Chronic Care Management Pharmacy Assistant   Name: David Benton  MRN: [id number] DOB: May 25, 1929  Reason for Encounter: Check in per last Hypertension Call about BP machine and readings   Conditions to be addressed/monitored: HTN  Recent office visits:  None  Recent consult visits:  09-04-2020 Tat, David Quail, DO (Neurology) - Patient presented for Parkinson's disease. [place] 4 % [facility] visits:  None in previous 6 months  Medications: Outpatient Encounter Medications as of 09/23/2020  Medication Sig   ACETAMINOPHEN PO Take 650 mg by mouth every 6 (six) hours as needed for pain.    amoxicillin (AMOXIL) 500 MG capsule Take 4 capsules (2,000 mg) one hour prior to all dental visits.   atorvastatin (LIPITOR) 40 MG tablet TAKE 1 TABLET BY MOUTH EVERY DAY   betamethasone dipropionate 0.05 % cream APPLY TO AFFECTED AREA OF SKIN UP TO 2 TIMES DAILY AS NEEDED. DO NOT APPLY TO FACE, GROIN, OR AXILLA   carbidopa-levodopa (SINEMET IR) 25-100 MG tablet TAKE 1 TABLET BY MOUTH 3 TIMES DAILY   levothyroxine (SYNTHROID) 75 MCG tablet TAKE 1 TABLET BY MOUTH ONCE DAILY   magnesium hydroxide (MILK OF MAGNESIA) 400 MG/5ML suspension Take 30 mLs by mouth daily as needed. Reported on 04/21/2015   Probiotic Product (PROBIOTIC-10 ULTIMATE) CAPS Take 1 capsule by mouth daily.   saline (AYR) GEL Place 1 application into the nose in the morning and at bedtime.    sodium chloride (OCEAN) 0.65 % SOLN nasal spray Place 1 spray into both nostrils as needed for congestion. Reported on 04/21/2015   triamterene-hydrochlorothiazide (MAXZIDE-25) 37.5-25 MG tablet TAKE 1 TABLET BY MOUTH EVERY MORNING   warfarin (COUMADIN) 5 MG tablet TAKE AS DIRECTED BY COUMADIN CLINIC   No facility-administered encounter medications on file as of 09/23/2020.  Reviewed chart prior to disease state call. Spoke with patient regarding BP  Recent Office Vitals: BP Readings from Last 3 Encounters:  09/04/20 (!) 159/79   05/05/20 110/60  02/21/20 120/82   Pulse Readings from Last 3 Encounters:  09/04/20 74  05/05/20 61  02/21/20 74    Wt Readings from Last 3 Encounters:  09/04/20 143 lb 6.4 oz (65 kg)  05/05/20 141 lb 12.8 oz (64.3 kg)  02/21/20 141 lb (64 kg)     Kidney Function Lab Results  Component Value Date/Time   CREATININE 0.97 05/05/2020 08:56 AM   CREATININE 0.96 04/25/2019 10:26 AM   CREATININE 1.09 07/30/2014 02:00 PM   CREATININE 1.07 07/30/2013 09:13 AM   GFR 69.58 05/05/2020 08:56 AM   GFRNONAA 59 (L) 11/17/2016 10:26 AM   GFRAA >60 11/17/2016 10:26 AM    BMP Latest Ref Rng & Units 05/05/2020 04/25/2019 02/26/2018  Glucose 70 - 99 mg/dL 99 106(H) 102(H)  BUN 6 - 23 mg/dL 25(H) 19 21  Creatinine 0.40 - 1.50 mg/dL 0.97 0.96 0.97  BUN/Creat Ratio 10 - 24 - - -  Sodium 135 - 145 mEq/L 138 138 139  Potassium 3.5 - 5.1 mEq/L 4.5 3.8 4.2  Chloride 96 - 112 mEq/L 102 100 100  CO2 19 - 32 mEq/L 32 32 32  Calcium 8.4 - 10.5 mg/dL 9.1 9.4 9.6    Current antihypertensive regimen:  Triamterene/HCTZ 37.5-25 mg 1 tablet every morning (patient takes after breakfast) How often are you checking your Blood Pressure? Patient reports several times a week Current home BP readings: Patient reported he took his machine to [employer] and was given instruction on how to  work it and he gave me the following readings; 09-09-2020 3p 148/71 09-11-20 4p 139/63 09-14-20 5p 149/67 09-18-20 130/72 and 5 min later 117/64 09-22-2020 154/78 and 5 min later 148/53 Patient reports he has not been having any headaches and was not under any stress he is aware of. He reports he is taking all the above medications as prescribed. What recent interventions/DTPs have been made by any provider to improve Blood Pressure control since last CPP Visit: Patient reports none. Any recent hospitalizations or ED visits since last visit with CPP? None What diet changes have been made to improve Blood Pressure Control?  Patient  reports his wife does all of the cooking. For breakfast he will have cereal,granola, and bread like english muffin, pancake, waffle or scone. For lunch he will have soup and toast or sandwich and yogurt with fruit. For dinner he has chicken or fish very little red meat and veggies and a fruit. He is drinking water but not as much as he thinks he should What exercise is being done to improve your Blood Pressure Control?  Patient reports he participates in boxing and strength training 3 times a week, he has a stretching class once a week and he volunteers at [facility] hospital 3.5 hours on Wednesdays. He reports in his spare time he does go for walks  Adherence Review: Is the patient currently on ACE/ARB medication? No Does the patient have >5 day gap between last estimated fill dates? No   Care Gaps: CCM F/U call - 11-06-20 10 am AWV - Done 06-29-2020 COVID Booster #4 [employer]) - Overdue Flu Vaccine - Overdue  Star Rating Drugs: Atorvastatin (Lipitor) 40 mg - Last filled 09-14-2020 30 DS  at [place] Pharmacist Assistant [phone]

## 2020-09-23 NOTE — Patient Instructions (Signed)
Description   Continue taking Warfarin 1.5 tablets every day except 2 tablets on Saturdays. [facility] # [phone]. Call with any concerns  Recheck INR in 6 weeks.

## 2020-10-05 ENCOUNTER — Ambulatory Visit: Payer: Medicare Other | Admitting: Physician Assistant

## 2020-10-05 ENCOUNTER — Ambulatory Visit
Admission: RE | Admit: 2020-10-05 | Discharge: 2020-10-05 | Disposition: A | Discharge status: Home / Self Care | Payer: Medicare Other | Source: Ambulatory Visit | Attending: Surgery | Admitting: Surgery

## 2020-10-05 ENCOUNTER — Other Ambulatory Visit: Payer: Self-pay

## 2020-10-05 VITALS — BP 137/60 | HR 75 | Resp 20 | Ht 70.0 in | Wt 139.8 lb

## 2020-10-05 DIAGNOSIS — I712 Thoracic aortic aneurysm, without rupture, unspecified: Secondary | ICD-10-CM

## 2020-10-05 DIAGNOSIS — J984 Other disorders of lung: Secondary | ICD-10-CM | POA: Diagnosis not present

## 2020-10-05 DIAGNOSIS — I700 Atherosclerosis of aorta: Secondary | ICD-10-CM | POA: Diagnosis not present

## 2020-10-05 DIAGNOSIS — I2510 Atherosclerotic heart disease of native coronary artery without angina pectoris: Secondary | ICD-10-CM | POA: Diagnosis not present

## 2020-10-05 MED ORDER — IOPAMIDOL (ISOVUE-370) INJECTION 76%
75.0000 mL | Freq: Once | INTRAVENOUS | Status: AC | PRN
  Administered 2020-10-05: 75 mL via INTRAVENOUS

## 2020-10-05 NOTE — Progress Notes (Signed)
[address]       [place] [address]             [phone]             Tytus W Hurlock Potter Medical Record [id number] Date of Birth: 06-23-1929  Referring:  Dr Einar Crow Primary Care: [name], MD Primary Cardiology: Sherren Mocha MD Chief Complaint:    Chief Complaint  Patient presents with   Thoracic Aortic Aneurysm    1 year f/u with CTA Chest    History of Present Illness:    Patient returns to the office today in followup after aortic valve replacement with a 29 mm St. Jude mechanical valve (29A-101  SN [id number]) in August of 1991  for aortic stenosis and bicuspid aortic valve. He was seen in April of 2009 because of aortic dilation on echocardiogram.  Since that time he's had serial CT scans to evaluate the size of his aorta . At age 87 he remains active exercising daily  He continues on Coumadin without evidence of significant bleeding complication . As of  August the patient is now 87 years following his aortic valve replacement.  In spite of his Parkinson's continues to be active, notes today he is doing resume boxing classes to assist in his physical therapy related to his Parkinson's. He also walks his two dogs daily and lives at home with his wife.   Current Activity/ Functional Status: Patient is  independent with mobility/ambulation, transfers, ADL's, IADL's.   Past Medical History:  Diagnosis Date   AVD (aortic valve disease) 02/25/2010   sees [name]    Bilateral carotid artery stenosis 07/10/2016   Carotid US 10/2018: Bilateral ICA 1-39; right subclavian 1.7 cm (no change since 03/2017)   External hemorrhoids without mention of complication    Heart valve replaced by other means    Long term (current) use of anticoagulants 09/15/2010   Meniere's disease    Other and unspecified hyperlipidemia    Other constipation    Parkinson disease ([place]) 02/2016   no meds, sees [name]    Personal history of colonic polyps     Personal history of malignant neoplasm of prostate    Right inguinal hernia 11/18/2016   Shingles    SHINGLES 11/26/2009   Thoracic aneurysm without mention of rupture    followed by [name] with yearly CT scans    Unspecified essential hypertension     Past Surgical History:  Procedure Laterality Date   AORTIC VALVE REPLACEMENT  1991    st. jude's valve, per [name]    CATARACT EXTRACTION Left 12/2018   COLONOSCOPY  06-28-10   per Dr. Henrene Pastor, benign polyps, no further scopes needed    [place] Right 11/18/2016   Procedure: [place];  Surgeon: Fanny Skates, MD;  Location: [facility];  Service: General;  Laterality: Right;   INSERTION OF MESH Right 11/18/2016   Procedure: INSERTION OF MESH;  Surgeon: Fanny Skates, MD;  Location: [facility];  Service: General;  Laterality: Right;   PROSTATECTOMY  1995   per Dr. Roni Bread    TONSILLECTOMY      Family History  Problem Relation Age of Onset   Heart attack Father 58       fatal at 31   Hypertension Father    CAD Father  Other Father        possible PD as says had "palsy"   Prostate cancer Brother    Diabetes Brother    Heart attack Mother    Diabetes Brother    Stroke Brother    Heart disease Brother        carotid artery disease   Hypertension Brother    Breast cancer Sister        mastectomy at 1   Colon cancer Neg Hx     Social History   Socioeconomic History   Marital status: Married    Spouse name: Not on file   Number of children: 0   Years of education: 13   Highest education level: Not on file  Occupational History   Occupation: retired    Comment: [employer]  Tobacco Use   Smoking status: Former Smoker    Packs/day: 0.50    Years: 10.00    Pack years: 5.00    Types: Cigarettes    Quit date: 02/06/1972    Years since quitting: 47.6   Smokeless tobacco: Never Used  Vaping  Use   Vaping Use: Never used  Substance and Sexual Activity   Alcohol use: Yes    Alcohol/week: 7.0 standard drinks    Types: 7 Standard drinks or equivalent per week    Comment: glass of wine daily; just about    Drug use: No   Sexual activity: Never  Other Topics Concern   Not on file  Social History Narrative   HSG, a couple of years of college. Married '78 - . No children. Work - [employer] 52 years - retired.   Lives alone with wife and two dogs. ACP - Yes CPR, Yes - short-term mechanical ventilation. Does not want prolonged heroic measures in the face of irreversible disease. HCPOA - wife. Alternative is his brother: [facility].       Right handed   .  Social History   Tobacco Use  Smoking Status Former Smoker   Packs/day: 0.50   Years: 10.00   Pack years: 5.00   Types: Cigarettes   Quit date: 02/06/1972   Years since quitting: 47.6  Smokeless Tobacco Never Used    Social History   Substance and Sexual Activity  Alcohol Use Yes   Alcohol/week: 7.0 standard drinks   Types: 7 Standard drinks or equivalent per week   Comment: glass of wine daily; just about      Allergies  Allergen Reactions   Codeine     REACTION: N/V    Current Outpatient Medications on File Prior to Visit  Medication Sig Dispense Refill   ACETAMINOPHEN PO Take 650 mg by mouth every 6 (six) hours as needed for pain.      amoxicillin (AMOXIL) 500 MG capsule Take 4 capsules (2,000 mg) one hour prior to all dental visits. 12 capsule 6   atorvastatin (LIPITOR) 40 MG tablet TAKE 1 TABLET BY MOUTH EVERY DAY 30 tablet 11   betamethasone dipropionate 0.05 % cream APPLY TO AFFECTED AREA OF SKIN UP TO 2 TIMES DAILY AS NEEDED. DO NOT APPLY TO FACE, GROIN, OR AXILLA     carbidopa-levodopa (SINEMET IR) 25-100 MG tablet TAKE 1 TABLET BY MOUTH 3 TIMES DAILY 90 tablet 0   levothyroxine (SYNTHROID) 75 MCG tablet TAKE 1 TABLET BY MOUTH ONCE DAILY 90 tablet 1   magnesium hydroxide (MILK OF  MAGNESIA) 400 MG/5ML suspension Take 30 mLs by mouth  daily as needed. Reported on 04/21/2015     Probiotic Product (PROBIOTIC-10 ULTIMATE) CAPS Take 1 capsule by mouth daily.     saline (AYR) GEL Place 1 application into the nose in the morning and at bedtime.      sodium chloride (OCEAN) 0.65 % SOLN nasal spray Place 1 spray into both nostrils as needed for congestion. Reported on 04/21/2015     triamterene-hydrochlorothiazide (MAXZIDE-25) 37.5-25 MG tablet TAKE 1 TABLET BY MOUTH EVERY MORNING 90 tablet 3   warfarin (COUMADIN) 5 MG tablet TAKE AS DIRECTED BY COUMADIN CLINIC 50 tablet 3   No current facility-administered medications on file prior to visit.        Physical Exam: Vitals:   10/05/20 1247  BP: 137/60  Pulse: 75  Resp: 20  SpO2: 98%    Physical Exam:   General: well-appearing male patient Cor: RRR, no murmur, mechanical click heard Pulm: CTA bilaterally and in all fields Abd: no tenderness Ext: no edema  Diagnostic Studies & Laboratory data:  CLINICAL DATA:  Thoracic aortic aneurysm.   EXAM: CT ANGIOGRAPHY CHEST WITH CONTRAST   TECHNIQUE: Multidetector CT imaging of the chest was performed using the standard protocol during bolus administration of intravenous contrast. Multiplanar CT image reconstructions and MIPs were obtained to evaluate the vascular anatomy.   CONTRAST:  23m ISOVUE-370 IOPAMIDOL (ISOVUE-370) INJECTION 76%   COMPARISON:  September 19, 2019.   FINDINGS:  Cardiovascular: Grossly stable 4.5 cm ascending thoracic aortic aneurysm is noted. Atherosclerosis of thoracic aorta is noted without dissection. Transverse aortic arch measures 2.7 cm. Proximal descending thoracic aorta measures 3.5 cm. Great vessels are widely patent without stenosis. Normal cardiac size. No pericardial effusion. Status post aortic valve repair. Mild coronary artery calcifications are noted.   Mediastinum/Nodes: No enlarged mediastinal, hilar, or axillary  lymph nodes. Thyroid gland, trachea, and esophagus demonstrate no significant findings.   Lungs/Pleura: No pneumothorax or pleural effusion is noted. Minimal biapical scarring is noted. New 11 x 6 mm nodular pleural-based density is noted medially in the right lower lobe posteriorly. This is concerning for possible pulmonary nodule.   Upper Abdomen: No acute abnormality.   Musculoskeletal: No chest wall abnormality. No acute or significant osseous findings.   Review of the MIP images confirms the above findings.   IMPRESSION:  Grossly stable 4.5 cm ascending thoracic aortic aneurysm. Recommend semi-annual imaging followup by CTA or MRA and referral to cardiothoracic surgery if not already obtained. This recommendation follows 2010 ACCF/AHA/AATS/ACR/ASA/SCA/SCAI/SIR/STS/SVM Guidelines for the Diagnosis and Management of Patients With Thoracic Aortic Disease. Circulation. 2010; 121:[id number] Aortic aneurysm NOS (ICD10-I71.9).   Interval development of 11 x 6 mm nodular pleural-based density seen medially in right lower lobe posteriorly concerning for pulmonary nodule. Consider one of the following in 3 months for both low-risk and high-risk individuals: (a) repeat chest CT, (b) follow-up PET-CT, or (c) tissue sampling. This recommendation follows the consensus statement: Guidelines for Management of Incidental Pulmonary Nodules Detected on CT Images: From the Fleischner Society 2017; Radiology 2017; 284:228-243.   Mild coronary artery calcifications are noted.   Aortic Atherosclerosis (ICD10-I70.0).     Electronically Signed   By: JMarijo ConceptionM.D.   On: 10/05/2020 12:43     Recent Radiology Findings: CT HEAD WO CONTRAST  Result Date: 09/30/2019 CLINICAL DATA:  Central vertigo.  History of prostate cancer. EXAM: CT HEAD WITHOUT CONTRAST TECHNIQUE: Contiguous axial images were obtained from the base of the skull through the vertex without intravenous contrast.  COMPARISON:  CT head 03/18/2016 FINDINGS: Brain: Generalized atrophy. Negative for hydrocephalus. Negative for acute infarct, hemorrhage, mass Vascular: Negative for hyperdense vessel Skull: Negative Sinuses/Orbits: Paranasal sinuses clear. Bilateral cataract extraction Other: None IMPRESSION: Mild atrophy.  No acute abnormality. Electronically Signed   By: Franchot Gallo M.D.   On: 09/30/2019 14:09   CT ANGIO CHEST AORTA W/CM & OR WO/CM  Result Date: 09/19/2019 CLINICAL DATA:  Followup thoracic aortic aneurysm. EXAM: CT ANGIOGRAPHY CHEST WITH CONTRAST TECHNIQUE: Multidetector CT imaging of the chest was performed using the standard protocol during bolus administration of intravenous contrast. Multiplanar CT image reconstructions and MIPs were obtained to evaluate the vascular anatomy. CONTRAST:  58m ISOVUE-370 IOPAMIDOL (ISOVUE-370) INJECTION 76% COMPARISON:  09/13/2018 FINDINGS: Cardiovascular: Aortic atherosclerosis. The ascending thoracic aorta measures 4.6 x 4.5 cm, image 79/4. This is unchanged when compared with previous exam. No signs of thoracic aortic dissection. Previous median sternotomy and aortic valve repair. Normal heart size. No pericardial effusion. Coronary artery calcifications noted. Mediastinum/Nodes: No enlarged mediastinal, hilar, or axillary lymph nodes. Thyroid gland, trachea, and esophagus demonstrate no significant findings. Lungs/Pleura: No pleural effusion. No airspace consolidation, atelectasis or pneumothorax identified. Stable nodule within the posterior left upper lobe measuring 3 mm, image 35/6. 4 mm right middle lobe lung nodule is also unchanged, image 116/6. No new or enlarging lung nodules. Upper Abdomen: No acute abnormality identified within the imaged portions of the upper abdomen. Bilateral renal cortical scarring and thinning identified. Musculoskeletal: Mild scoliosis with degenerative disc disease. No acute or suspicious osseous findings identified. Review of the MIP  images confirms the above findings. IMPRESSION: 1. Stable appearance of ascending thoracic aortic aneurysm measuring 4.6 x 4.5 cm. Ascending thoracic aortic aneurysm. Recommend semi-annual imaging followup by CTA or MRA and referral to cardiothoracic surgery if not already obtained. This recommendation follows 2010 ACCF/AHA/AATS/ACR/ASA/SCA/SCAI/SIR/STS/SVM Guidelines for the Diagnosis and Management of Patients With Thoracic Aortic Disease. Circulation. 2010; 121:[id number] Aortic aneurysm NOS (ICD10-I71.9) 2. Small nonspecific pulmonary nodules are unchanged from previous exam. These both measure less than 5 mm and are compatible with a benign process. 3. Coronary artery calcifications. Aortic Atherosclerosis (ICD10-I70.0). Electronically Signed   By: TKerby MoorsM.D.   On: 09/19/2019 10:20        Recent Lab Findings: Lab Results  Component Value Date   WBC 4.1 04/25/2019   HGB 14.5 04/25/2019   HCT 42.0 04/25/2019   PLT 224.0 04/25/2019   GLUCOSE 106 (H) 04/25/2019   CHOL 143 04/25/2019   TRIG 72.0 04/25/2019   HDL 55.30 04/25/2019   LDLCALC 74 04/25/2019   ALT 24 04/25/2019   AST 26 04/25/2019   NA 138 04/25/2019   K 3.8 04/25/2019   CL 100 04/25/2019   CREATININE 0.96 04/25/2019   BUN 19 04/25/2019   CO2 32 04/25/2019   TSH 1.34 04/25/2019   INR 3.2 (A) 09/18/2019   Aortic Size Index=     4.6    /Body surface area is 1.77 meters squared. =  2.51  < 2.75 cm/m2      4% risk per year 2.75 to 4.25          8% risk per year > 4.25 cm/m2    20% risk per year  cross sectional area of aorta cm2/height in meters  16.6/1.77= 9.3   Assessment / Plan:   status post replacement of aortic valve with 29 mm mechanical St. Jude  In 1991 now 30 years postop follow-up for aneurysmal dilatation of the  ascending thoracic aorta. Long term coumadin therapy. Stable thoracic aortic aneurysm.  Hypertension-well controlled here in the office and at home Avoid fluoroquinolones New potential  pulmonary nodule found in the right lower lobe. Recommending PET, f/u CT, or biopsy in the next 3 months.   Plan: We will get him an appointment with one of the surgeons to discuss options regarding his pulmonary nodule. He will f/u in 1 year with a CTA for aortic aneurysm surveillance.   Nicholes Rough, PA-C Office [phone]

## 2020-10-06 DIAGNOSIS — Z20822 Contact with and (suspected) exposure to covid-19: Secondary | ICD-10-CM | POA: Diagnosis not present

## 2020-10-08 ENCOUNTER — Encounter: Payer: Self-pay | Admitting: Family Medicine

## 2020-10-08 ENCOUNTER — Telehealth (INDEPENDENT_AMBULATORY_CARE_PROVIDER_SITE_OTHER): Payer: Medicare Other | Admitting: Family Medicine

## 2020-10-08 VITALS — Temp 98.0°F

## 2020-10-08 DIAGNOSIS — U071 COVID-19: Secondary | ICD-10-CM | POA: Diagnosis not present

## 2020-10-08 NOTE — Progress Notes (Signed)
   Subjective:    Patient ID: David Benton, male    DOB: 05-03-1929, 87 y.o.   MRN: [id number]  HPI Virtual Visit via Telephone Note  I connected with the patient on 10/08/20 at 11:00 AM EDT by telephone and verified that I am speaking with the correct person using two identifiers.   I discussed the limitations, risks, security and privacy concerns of performing an evaluation and management service by telephone and the availability of in person appointments. I also discussed with the patient that there may be a patient responsible charge related to this service. The patient expressed understanding and agreed to proceed.  Location patient: home Location provider: work or home office Participants present for the call: patient, provider Patient did not have a visit in the prior 7 days to address this/these issue(s).   History of Present Illness: Here for a Covid-19 infection. He began to have a ST 5 days ago, and since then he has also developed a dry cough, fatigue, diarrhea, and a fever to 99.4 degrees. He tested positive for the Covid virus 3 days ago. Today he feels much better, and a mild ST is the only symptom he has now. He is drinking fluids and taking Tylenol. No chest pain or SOB.    Observations/Objective: Patient sounds cheerful and well on the phone. I do not appreciate any SOB. Speech and thought processing are grossly intact. Patient reported vitals:  Assessment and Plan: Covid-19 infection. He seems to be over the worst of it. We agreed to not give him an antiviral medication. He will  follow up as needed. Alysia Penna, MD   Follow Up Instructions:     [phone] 5-10 [phone] 11-20 [id number] 21-30 I did not refer this patient for an OV in the next 24 hours for this/these issue(s).  I discussed the assessment and treatment plan with the patient. The patient was provided an opportunity to ask questions and all were answered. The patient agreed with the plan and demonstrated an  understanding of the instructions.   The patient was advised to call back or seek an in-person evaluation if the symptoms worsen or if the condition fails to improve as anticipated.  I provided 18 minutes of non-face-to-face time during this encounter.   Alysia Penna, MD     Review of Systems     Objective:   Physical Exam        Assessment & Plan:

## 2020-10-13 ENCOUNTER — Other Ambulatory Visit: Payer: Self-pay | Admitting: Cardiovascular Disease

## 2020-10-19 ENCOUNTER — Ambulatory Visit (HOSPITAL_COMMUNITY)
Admission: RE | Admit: 2020-10-19 | Payer: Medicare Other | Source: Ambulatory Visit | Attending: Physician Assistant | Admitting: Physician Assistant

## 2020-10-22 NOTE — Progress Notes (Signed)
Cardiology Office Note:    Date:  10/23/2020   ID:  David Benton, DOB 10-Feb-1929, MRN [id number]  PCP:  [name], MD   [facility] Providers Cardiologist:  Sherren Mocha, MD Cardiology APP:  Sharmon Revere    Referring MD: [name], MD   Chief Complaint:  Follow-up for history of AVR, thoracic aneurysm    Patient Profile:   David Benton is a 87 y.o. male with:  Bicuspid Aortic Valve Stenosis S/p St Jude Mechanical AVR in 1991 No longer on ASA due to hx of spontaneous forearm hematoma  Thoracic Aortic Aneurysm  Followed by [name] >> [name] CT 08/2018: 4.6 x 4.5 cm CT 9/21: 4.6x4.5cm Carotid Artery Dz  Hypertension  Hyperlipidemia  Parkinson's Disease Aortic atherosclerosis  Prostate CA Meniere's Dz Lung nodule   Prior CV studies: Carotid US 10/28/2019 Bilateral ICA 1-39 Atypical antegrade flow in bilateral vertebral arteries Right subclavian artery flow disturbed; prominent right subclavian artery (1.8 cm)  Chest CTA 10/05/2020 IMPRESSION: Grossly stable 4.5 cm ascending thoracic aortic aneurysm. Recommend semi-annual imaging followup by CTA or MRA and referral to cardiothoracic surgery if not already obtained. This recommendation follows 2010 ACCF/AHA/AATS/ACR/ASA/SCA/SCAI/SIR/STS/SVM Guidelines for the Diagnosis and Management of Patients With Thoracic Aortic Disease. Circulation. 2010; 121: V253-G644. Aortic aneurysm NOS (ICD10-I71.9).   Interval development of 11 x 6 mm nodular pleural-based density seen medially in right lower lobe posteriorly concerning for pulmonary nodule. Consider one of the following in 3 months for both low-risk and high-risk individuals: (a) repeat chest CT, (b) follow-up PET-CT, or (c) tissue sampling. This recommendation follows the consensus statement: Guidelines for Management of Incidental Pulmonary Nodules Detected on CT Images: From the Fleischner Society 2017; Radiology 2017; 284:228-243.    Mild coronary artery calcifications are noted.   Aortic Atherosclerosis (ICD10-I70.0).   Carotid US 10/29/2018 Bilat ICA 1-39; dilated R subclavian (1.7 cm)   Carotid US 04/11/17 Bilat ICA 1-39 Dilated R subclavian artery (1.7 x 1.8 cm)   Echocardiogram 02/09/2016 Mild LVH, severe focal basal septal hypertrophy, EF 60-65, no RWMA, Gr 1 DD, mechanical AVR w/ mean gradient 7, ascending aorta 45 mm (mildly dilated), severe LAE, aneurysmal IAS, mild TR, PASP 37   History of Present Illness: [name] was previously followed by [name], [name] and then [name].  I have seen him primarily since [name] transitioned to [place].  He was last seen in 9/21.  He returns for follow-up.  He is here alone.  He remains active.  He exercises several times a week and still volunteers at the hospital.  He has not had chest pain, shortness of breath, syncope, orthopnea, leg edema.        Past Medical History:  Diagnosis Date   AVD (aortic valve disease) 02/25/2010   sees [name]    Bilateral carotid artery stenosis 07/10/2016   Carotid US 10/2018: Bilateral ICA 1-39; right subclavian 1.7 cm (no change since 03/2017) // Carotid US 10/21: Bilat ICA 1-39   External hemorrhoids without mention of complication    Heart valve replaced by other means    Long term (current) use of anticoagulants 09/15/2010   Meniere's disease    Other and unspecified hyperlipidemia    Other constipation    Parkinson disease ([place]) 02/2016   no meds, sees [name]    Personal history of colonic polyps    Personal history of malignant neoplasm of prostate    Right inguinal hernia  11/18/2016   Shingles    SHINGLES 11/26/2009   Thoracic aneurysm without mention of rupture    followed by [name] with yearly CT scans    Unspecified essential hypertension    Current Medications: Current Meds  Medication Sig   ACETAMINOPHEN PO Take 650 mg by mouth every 6 (six) hours as needed for pain.    amoxicillin  (AMOXIL) 500 MG capsule Take 4 capsules (2,000 mg) one hour prior to all dental visits.   atorvastatin (LIPITOR) 40 MG tablet TAKE 1 TABLET BY MOUTH EVERY DAY   betamethasone dipropionate 0.05 % cream APPLY TO AFFECTED AREA OF SKIN UP TO 2 TIMES DAILY AS NEEDED. DO NOT APPLY TO FACE, GROIN, OR AXILLA   carbidopa-levodopa (SINEMET IR) 25-100 MG tablet TAKE 1 TABLET BY MOUTH 3 TIMES DAILY   levothyroxine (SYNTHROID) 75 MCG tablet TAKE 1 TABLET BY MOUTH ONCE DAILY   magnesium hydroxide (MILK OF MAGNESIA) 400 MG/5ML suspension Take 30 mLs by mouth daily as needed. Reported on 04/21/2015   Probiotic Product (PROBIOTIC-10 ULTIMATE) CAPS Take 1 capsule by mouth daily.   saline (AYR) GEL Place 1 application into the nose in the morning and at bedtime.    sodium chloride (OCEAN) 0.65 % SOLN nasal spray Place 1 spray into both nostrils as needed for congestion. Reported on 04/21/2015   triamterene-hydrochlorothiazide (MAXZIDE-25) 37.5-25 MG tablet TAKE 1 TABLET BY MOUTH EVERY MORNING   warfarin (COUMADIN) 5 MG tablet TAKE AS DIRECTED BY COUMADIN CLINIC    Allergies:   Codeine   Social History   Tobacco Use   Smoking status: Former    Packs/day: 0.50    Years: 10.00    Pack years: 5.00    Types: Cigarettes    Quit date: 02/06/1972    Years since quitting: 48.7   Smokeless tobacco: Never  Vaping Use   Vaping Use: Never used  Substance Use Topics   Alcohol use: Yes    Alcohol/week: 7.0 standard drinks    Types: 7 Standard drinks or equivalent per week    Comment: glass of wine daily; just about    Drug use: No    Family Hx: The patient's family history includes Breast cancer in his sister; CAD in his father; Diabetes in his brother and brother; Heart attack in his mother; Heart attack (age of onset: 64) in his father; Heart disease in his brother; Hypertension in his brother and father; Other in his father; Prostate cancer in his brother; Stroke in his brother. There is no history of Colon  cancer.  Review of Systems  Gastrointestinal:  Negative for hematochezia.  Genitourinary:  Negative for hematuria.    EKGs/Labs/Other Test Reviewed:    EKG:  EKG is   ordered today.  The ekg ordered today demonstrates NSR, HR 69, first-degree AV block, PR 236, left bundle branch block (new) received 475  Recent Labs: 05/05/2020: ALT 5; BUN 25; Creatinine, Ser 0.97; Hemoglobin 13.5; Platelets 200.0; Potassium 4.5; Sodium 138; TSH 0.85   Recent Lipid Panel Lab Results  Component Value Date/Time   CHOL 112 05/05/2020 08:56 AM   CHOL 136 07/08/2016 02:49 PM   TRIG 58.0 05/05/2020 08:56 AM   HDL 42.40 05/05/2020 08:56 AM   HDL 49 07/08/2016 02:49 PM   LDLCALC 58 05/05/2020 08:56 AM   LDLCALC 63 07/08/2016 02:49 PM     Risk Assessment/Calculations:          Physical Exam:    VS:  BP (!) 124/42  Pulse 69   Ht 5\' 10"  (1.778 m)   Wt 138 lb 12.8 oz (63 kg)   SpO2 97%   BMI 19.92 kg/m     Wt Readings from Last 3 Encounters:  10/23/20 138 lb 12.8 oz (63 kg)  10/05/20 139 lb 12.8 oz (63.4 kg)  09/04/20 143 lb 6.4 oz (65 kg)    Constitutional:      Appearance: Healthy appearance. Not in distress.  Neck:     Vascular: JVD normal.  Pulmonary:     Effort: Pulmonary effort is normal.     Breath sounds: No wheezing. No rales.  Cardiovascular:     Normal rate. Regular rhythm. Normal S1. S2. Mechanical      Murmurs: There is a grade 1/6 systolic murmur at the URSB.  Edema:    Peripheral edema absent.  Abdominal:     Palpations: Abdomen is soft.  Skin:    General: Skin is warm and dry.  Neurological:     General: No focal deficit present.     Mental Status: Alert and oriented to person, place and time.     Cranial Nerves: Cranial nerves are intact.        ASSESSMENT & PLAN:   1. Aortic valve disorder 2. S/P aortic valve replacement His surgery was in 1991.  His last echocardiogram was in 2018.  He now has a new left bundle branch block on electrocardiogram.  I will  obtain a follow-up echocardiogram as outlined below.  Continue Coumadin which is followed in our Coumadin clinic.  Continue SBE prophylaxis.  3. Bilateral carotid artery stenosis He has follow-up carotid ultrasound scheduled next week.  4. Aneurysm of ascending aorta without rupture Followed by cardiothoracic surgery.  Most recent CT stable at 4.5 cm.  He also has a right lower lobe nodule which is being followed.  5. Essential hypertension Blood pressure is well controlled.  Continue triamterene/HCTZ 37.5/25 mg daily  6. Hyperlipidemia LDL goal <70 LDL optimal on most recent labs.  Continue atorvastatin 40 mg daily.  7. LBBB (left bundle branch block) As noted, he has a new left bundle branch block on electrocardiogram today.  Fortunately, he does not have any symptoms to suggest ischemia or heart failure.  However, it has been several years since he had an assessment for ischemia.  I have recommended proceeding with an echocardiogram as well as a [employer].  If the studies are unremarkable, he will need no further evaluation.  I will see him back in 3 months.     Shared Decision Making/Informed Consent The risks [chest pain, shortness of breath, cardiac arrhythmias, dizziness, blood pressure fluctuations, myocardial infarction, stroke/transient ischemic attack, nausea, vomiting, allergic reaction, radiation exposure, metallic taste sensation and life-threatening complications (estimated to be 1 in 10,000)], benefits (risk stratification, diagnosing coronary artery disease, treatment guidance) and alternatives of a nuclear stress test were discussed in detail with Mr. Wesche and he agrees to proceed.   Dispo:  Return in about 3 months (around 01/23/2021) for Routine Follow Up, w/ Richardson Dopp, PA-C.   Medication Adjustments/Labs and Tests Ordered: Current medicines are reviewed at length with the patient today.  Concerns regarding medicines are outlined above.  Tests Ordered: Orders  Placed This Encounter  Procedures   Cardiac Stress Test: Informed Consent Details: Physician/Practitioner Attestation; Transcribe to consent form and obtain patient signature   Myocardial Perfusion Imaging   EKG 12-Lead   ECHOCARDIOGRAM COMPLETE   Medication Changes: No orders of the defined types were  placed in this encounter.  Signed, Richardson Dopp, PA-C  10/23/2020 10:55 AM    [facility] [address] [place], [place], [place]  [address] Phone: [phone]; Fax: [phone]

## 2020-10-23 ENCOUNTER — Ambulatory Visit: Payer: Medicare Other | Admitting: Physician Assistant

## 2020-10-23 ENCOUNTER — Encounter: Payer: Self-pay | Admitting: Physician Assistant

## 2020-10-23 ENCOUNTER — Other Ambulatory Visit: Payer: Self-pay

## 2020-10-23 VITALS — BP 124/42 | HR 69 | Ht 70.0 in | Wt 138.8 lb

## 2020-10-23 DIAGNOSIS — Z952 Presence of prosthetic heart valve: Secondary | ICD-10-CM

## 2020-10-23 DIAGNOSIS — I10 Essential (primary) hypertension: Secondary | ICD-10-CM | POA: Diagnosis not present

## 2020-10-23 DIAGNOSIS — I7121 Aneurysm of the ascending aorta, without rupture: Secondary | ICD-10-CM | POA: Diagnosis not present

## 2020-10-23 DIAGNOSIS — I359 Nonrheumatic aortic valve disorder, unspecified: Secondary | ICD-10-CM

## 2020-10-23 DIAGNOSIS — E785 Hyperlipidemia, unspecified: Secondary | ICD-10-CM | POA: Diagnosis not present

## 2020-10-23 DIAGNOSIS — I6523 Occlusion and stenosis of bilateral carotid arteries: Secondary | ICD-10-CM | POA: Diagnosis not present

## 2020-10-23 DIAGNOSIS — I447 Left bundle-branch block, unspecified: Secondary | ICD-10-CM | POA: Diagnosis not present

## 2020-10-23 NOTE — Patient Instructions (Signed)
Medication Instructions:   Your physician recommends that you continue on your current medications as directed. Please refer to the Current Medication list given to you today.   *If you need a refill on your cardiac medications before your next appointment, please call your pharmacy*   Lab Work:  -None  If you have labs (blood work) drawn today and your tests are completely normal, you will receive your results only by: [place] (if you have MyChart) OR A paper copy in the mail If you have any lab test that is abnormal or we need to change your treatment, we will call you to review the results.   Testing/Procedures:  Your physician has requested that you have an echocardiogram. Echocardiography is a painless test that uses sound waves to create images of your heart. It provides your doctor with information about the size and shape of your heart and how well your heart's chambers and valves are working. This procedure takes approximately one hour. There are no restrictions for this procedure.  You are scheduled for a Myocardial Perfusion Imaging Study on Friday, October 21 at 10:30 am.    Please arrive 15 minutes prior to your appointment time for registration and insurance purposes.   The test will take approximately 3 to 4 hours to complete; you may bring reading material. If someone comes with you to your appointment, they will need to remain in the main lobby due to limited space in the testing area.   How to prepare for your Myocardial Perfusion test:   Do not eat or drink 3 hours prior to your test, except you may have water.    Do not consume products containing caffeine (regular or decaffeinated) 12 hours prior to your test (ex: coffee, chocolate, soda, tea)   Do bring a list of your current medications with you. If not listed below, you may take your medications as normal.    Bring any held medication to your appointment, as you may be required to take it once the  test is complete.   Do wear comfortable clothes (no overalls) and walking shoes. Tennis shoes are preferred. No open toed shoes.  Do not wear cologne, aftershave or lotions (deodorant is allowed).   If these instructions are not followed, you test will have to be rescheduled.   Please report to [address] for your test. If you have questions or concerns about your appointment, please call the Nuclear Lab at [phone].  If you cannot keep your appointment, please provide 24 hour notification to the Nuclear lab to avoid a possible $50 charge to your account.       Follow-Up: At [facility], you and your health needs are our priority.  As part of our continuing mission to provide you with exceptional heart care, we have created designated Provider Care Teams.  These Care Teams include your primary Cardiologist (physician) and Advanced Practice Providers (APPs -  Physician Assistants and Nurse Practitioners) who all work together to provide you with the care you need, when you need it.  We recommend signing up for the patient portal called "MyChart".  Sign up information is provided on this After Visit Summary.  MyChart is used to connect with patients for Virtual Visits (Telemedicine).  Patients are able to view lab/test results, encounter notes, upcoming appointments, etc.  Non-urgent messages can be sent to your provider as well.   To learn more about what you can do with MyChart, go to [email or web address].  Your next appointment:   3 month(s)  The format for your next appointment:   In Person  Provider:   Richardson Dopp, PA-C   Other Instructions

## 2020-10-27 ENCOUNTER — Ambulatory Visit (HOSPITAL_COMMUNITY)
Admission: RE | Admit: 2020-10-27 | Discharge: 2020-10-27 | Disposition: A | Discharge status: Home / Self Care | Payer: Medicare Other | Source: Ambulatory Visit | Attending: Cardiology | Admitting: Cardiology

## 2020-10-27 ENCOUNTER — Other Ambulatory Visit: Payer: Self-pay

## 2020-10-27 DIAGNOSIS — I6523 Occlusion and stenosis of bilateral carotid arteries: Secondary | ICD-10-CM | POA: Insufficient documentation

## 2020-10-28 ENCOUNTER — Encounter: Payer: Self-pay | Admitting: Physician Assistant

## 2020-11-03 ENCOUNTER — Telehealth: Payer: Self-pay | Admitting: Family Medicine

## 2020-11-03 NOTE — Telephone Encounter (Signed)
Patient requests a call back to reschedule.

## 2020-11-04 ENCOUNTER — Ambulatory Visit: Payer: Medicare Other

## 2020-11-04 ENCOUNTER — Other Ambulatory Visit: Payer: Self-pay

## 2020-11-04 DIAGNOSIS — Z952 Presence of prosthetic heart valve: Secondary | ICD-10-CM

## 2020-11-04 DIAGNOSIS — I359 Nonrheumatic aortic valve disorder, unspecified: Secondary | ICD-10-CM | POA: Diagnosis not present

## 2020-11-04 DIAGNOSIS — Z5181 Encounter for therapeutic drug level monitoring: Secondary | ICD-10-CM

## 2020-11-04 LAB — POCT INR: INR: 4 — AB (ref 2.0–3.0)

## 2020-11-04 NOTE — Patient Instructions (Signed)
Description   Skip today's dosage of Warfarin, then resume same dosage of Warfarin 1.5 tablets every day except 2 tablets on Saturdays. Coumadin Clinic # [phone]. Call with any concerns  Recheck INR in 3 weeks.

## 2020-11-05 ENCOUNTER — Ambulatory Visit: Payer: Medicare Other | Admitting: Podiatry

## 2020-11-05 DIAGNOSIS — M7741 Metatarsalgia, right foot: Secondary | ICD-10-CM

## 2020-11-05 DIAGNOSIS — L84 Corns and callosities: Secondary | ICD-10-CM | POA: Diagnosis not present

## 2020-11-05 DIAGNOSIS — Z7901 Long term (current) use of anticoagulants: Secondary | ICD-10-CM | POA: Diagnosis not present

## 2020-11-05 DIAGNOSIS — M7742 Metatarsalgia, left foot: Secondary | ICD-10-CM

## 2020-11-05 NOTE — Progress Notes (Signed)
  Subjective:  Patient ID: David Benton, male    DOB: 12/15/1929,  MRN: [id number]  Chief Complaint  Patient presents with   Callouses    9 week callus care follow up    87 y.o. male returns with the above complaint. History confirmed with patient.  He is doing quite well with the urea cream and padding.  On the right foot submet 5 B/L now and the hallux  Objective:  Physical Exam: warm, good capillary refill, no trophic changes or ulcerative lesions, normal DP and PT pulses and normal sensory exam.  Left Foot: Submet 5 large hyperkeratosis, improved since last visit in character and quality Right Foot: Submet 5 and submet 1 large hyperkeratoses, both improved and softer than previously   Assessment:   1. Callus of foot   2. Metatarsalgia of both feet   3. Long term (current) use of anticoagulants       Plan:  Patient was evaluated and treated and all questions answered.  All symptomatic hyperkeratoses were safely debrided with a sterile #15 blade to patient's level of comfort without incident. We discussed preventative and palliative care of these lesions including supportive and accommodative shoegear, padding, prefabricated and custom molded accommodative orthoses, use of a pumice stone and lotions/creams daily.  Continue use of dancers pads and urea cream 40%   David Benton, DPM 11/05/2020    Return in about 9 weeks (around 01/07/2021) for painful calluses .

## 2020-11-06 ENCOUNTER — Telehealth: Payer: Medicare Other

## 2020-11-06 ENCOUNTER — Encounter (HOSPITAL_COMMUNITY): Payer: Medicare Other

## 2020-11-06 NOTE — Telephone Encounter (Signed)
Patient rescheduled telephone visit with pharmacist.

## 2020-11-12 DIAGNOSIS — Z85828 Personal history of other malignant neoplasm of skin: Secondary | ICD-10-CM | POA: Diagnosis not present

## 2020-11-12 DIAGNOSIS — L218 Other seborrheic dermatitis: Secondary | ICD-10-CM | POA: Diagnosis not present

## 2020-11-12 DIAGNOSIS — L570 Actinic keratosis: Secondary | ICD-10-CM | POA: Diagnosis not present

## 2020-11-12 DIAGNOSIS — D225 Melanocytic nevi of trunk: Secondary | ICD-10-CM | POA: Diagnosis not present

## 2020-11-12 DIAGNOSIS — L814 Other melanin hyperpigmentation: Secondary | ICD-10-CM | POA: Diagnosis not present

## 2020-11-12 DIAGNOSIS — L853 Xerosis cutis: Secondary | ICD-10-CM | POA: Diagnosis not present

## 2020-11-12 DIAGNOSIS — L821 Other seborrheic keratosis: Secondary | ICD-10-CM | POA: Diagnosis not present

## 2020-11-12 DIAGNOSIS — Z08 Encounter for follow-up examination after completed treatment for malignant neoplasm: Secondary | ICD-10-CM | POA: Diagnosis not present

## 2020-11-13 ENCOUNTER — Other Ambulatory Visit: Payer: Self-pay

## 2020-11-13 ENCOUNTER — Telehealth (HOSPITAL_COMMUNITY): Payer: Self-pay | Admitting: *Deleted

## 2020-11-13 ENCOUNTER — Ambulatory Visit (HOSPITAL_COMMUNITY): Payer: Medicare Other | Attending: Cardiology

## 2020-11-13 DIAGNOSIS — I447 Left bundle-branch block, unspecified: Secondary | ICD-10-CM | POA: Insufficient documentation

## 2020-11-13 DIAGNOSIS — Z952 Presence of prosthetic heart valve: Secondary | ICD-10-CM | POA: Insufficient documentation

## 2020-11-13 DIAGNOSIS — I359 Nonrheumatic aortic valve disorder, unspecified: Secondary | ICD-10-CM | POA: Insufficient documentation

## 2020-11-13 DIAGNOSIS — I6523 Occlusion and stenosis of bilateral carotid arteries: Secondary | ICD-10-CM | POA: Diagnosis not present

## 2020-11-13 DIAGNOSIS — E785 Hyperlipidemia, unspecified: Secondary | ICD-10-CM | POA: Insufficient documentation

## 2020-11-13 DIAGNOSIS — I10 Essential (primary) hypertension: Secondary | ICD-10-CM | POA: Diagnosis not present

## 2020-11-13 DIAGNOSIS — I7121 Aneurysm of the ascending aorta, without rupture: Secondary | ICD-10-CM | POA: Insufficient documentation

## 2020-11-13 LAB — ECHOCARDIOGRAM COMPLETE
AV Mean grad: 6 mmHg
AV Peak grad: 12.4 mmHg
Ao pk vel: 1.76 m/s
Area-P 1/2: 2.99 cm2
P 1/2 time: 350 msec
S' Lateral: 3.2 cm

## 2020-11-13 NOTE — Telephone Encounter (Signed)
Left message on voicemail per DPR in reference to upcoming appointment scheduled on 11/20/20 at 10:15 with detailed instructions given per Myocardial Perfusion Study Information Sheet for the test. LM to arrive 15 minutes early, and that it is imperative to arrive on time for appointment to keep from having the test rescheduled. If you need to cancel or reschedule your appointment, please call the office within 24 hours of your appointment. Failure to do so may result in a cancellation of your appointment, and a $50 no show fee. Phone number given for call back for any questions.

## 2020-11-16 ENCOUNTER — Encounter: Payer: Self-pay | Admitting: Physician Assistant

## 2020-11-18 ENCOUNTER — Telehealth (HOSPITAL_COMMUNITY): Payer: Self-pay | Admitting: Radiology

## 2020-11-18 NOTE — Telephone Encounter (Signed)
Patient given detailed instructions per Myocardial Perfusion Study Information Sheet for the test on 11/21/2020 at 10:15. Patient notified to arrive 15 minutes early and that it is imperative to arrive on time for appointment to keep from having the test rescheduled.  If you need to cancel or reschedule your appointment, please call the office within 24 hours of your appointment. . Patient verbalized understanding.EHK

## 2020-11-20 ENCOUNTER — Ambulatory Visit (HOSPITAL_COMMUNITY): Payer: Medicare Other

## 2020-11-20 ENCOUNTER — Other Ambulatory Visit: Payer: Self-pay

## 2020-11-20 ENCOUNTER — Telehealth (HOSPITAL_COMMUNITY): Payer: Self-pay | Admitting: Physician Assistant

## 2020-11-20 ENCOUNTER — Ambulatory Visit: Payer: Medicare Other

## 2020-11-20 DIAGNOSIS — Z5181 Encounter for therapeutic drug level monitoring: Secondary | ICD-10-CM | POA: Diagnosis not present

## 2020-11-20 LAB — POCT INR: INR: 3.1 — AB (ref 2.0–3.0)

## 2020-11-20 NOTE — Telephone Encounter (Signed)
Ok. Richardson Dopp, PA-C    11/20/2020 9:38 AM

## 2020-11-20 NOTE — Patient Instructions (Addendum)
-   continue same dosage of Warfarin 1.5 tablets every day except 2 tablets on Saturdays.  - Recheck INR in 4 weeks Coumadin Clinic # [phone]. Call with any concerns

## 2020-11-20 NOTE — Telephone Encounter (Signed)
Patient called and cancelled Myoview x 2 for reason below:  11/20/2020 9:22 AM [id number], SHARON S  Cancel Rsn: Patient (Patient sick) 11/06/20 Patient cancelled  Order will be removed from the active [place] and when patient calls back to reschedule we can reinstate the order.  Thank you.

## 2020-11-26 ENCOUNTER — Telehealth: Payer: Self-pay | Admitting: Pharmacist

## 2020-11-26 NOTE — Chronic Care Management (AMB) (Signed)
    Chronic Care Management Pharmacy Assistant   Name: David Benton  MRN: [id number] DOB: March 21, 1929   11/26/20 APPOINTMENT REMINDER  Patient  was reminded to have all medications, supplements and any blood glucose and blood pressure readings available for review with Jeni Salles, Pharm. D, for telephone visit on 11/11 at 9:30.  Care Gaps: CCM F/U call - 11-06-20 10 am AWV - 06/2020 COVID Booster #4 ([employer]) - Overdue BP- 124/42 (10/7)  Star Rating Drug: Atorvastatin (Lipitor) 40 mg - Last filled 11/12/2020 30 DS  at [facility]  Any gaps in medications fill history? None  Medications: Outpatient Encounter Medications as of 11/26/2020  Medication Sig   ACETAMINOPHEN PO Take 650 mg by mouth every 6 (six) hours as needed for pain.    amoxicillin (AMOXIL) 500 MG capsule Take 4 capsules (2,000 mg) one hour prior to all dental visits.   atorvastatin (LIPITOR) 40 MG tablet TAKE 1 TABLET BY MOUTH EVERY DAY   betamethasone dipropionate 0.05 % cream APPLY TO AFFECTED AREA OF SKIN UP TO 2 TIMES DAILY AS NEEDED. DO NOT APPLY TO FACE, GROIN, OR AXILLA   carbidopa-levodopa (SINEMET IR) 25-100 MG tablet TAKE 1 TABLET BY MOUTH 3 TIMES DAILY   levothyroxine (SYNTHROID) 75 MCG tablet TAKE 1 TABLET BY MOUTH ONCE DAILY   magnesium hydroxide (MILK OF MAGNESIA) 400 MG/5ML suspension Take 30 mLs by mouth daily as needed. Reported on 04/21/2015   Probiotic Product (PROBIOTIC-10 ULTIMATE) CAPS Take 1 capsule by mouth daily.   saline (AYR) GEL Place 1 application into the nose in the morning and at bedtime.    sodium chloride (OCEAN) 0.65 % SOLN nasal spray Place 1 spray into both nostrils as needed for congestion. Reported on 04/21/2015   triamterene-hydrochlorothiazide (MAXZIDE-25) 37.5-25 MG tablet TAKE 1 TABLET BY MOUTH EVERY MORNING   warfarin (COUMADIN) 5 MG tablet TAKE AS DIRECTED BY COUMADIN CLINIC   No facility-administered encounter medications on file as of 11/26/2020.   [place] Clinical Pharmacist Assistant [phone]

## 2020-11-26 NOTE — Progress Notes (Signed)
Chronic Care Management Pharmacy Note  11/27/2020 Name:  David Benton MRN:  [id number] DOB:  15-Aug-1929  Summary: BP is at goal < 140/90 per home readings Pt reports frequent nosebleeds   Recommendations/Changes made from today's visit: -Consider adjustment of INR goal to 2-3 given mechanical aortic valve replacement and sent message to cardiology to discuss -Recommended continued BP monitoring at home   Plan: Follow up BP assessment in 6 months  Subjective: David Benton is an 87 y.o. year old male who is a primary patient of [name], MD.  The CCM team was consulted for assistance with disease management and care coordination needs.    Engaged with patient by telephone for follow up visit in response to provider referral for pharmacy case management and/or care coordination services.   Consent to Services:  The patient was given information about Chronic Care Management services, agreed to services, and gave verbal consent prior to initiation of services.  Please see initial visit note for detailed documentation.   Patient Care Team: [name], MD as PCP - General (Family Medicine) Sherren Mocha, MD as PCP - Cardiology (Cardiology) Grace Isaac, MD (Inactive) as Consulting Physician (Cardiothoracic Surgery) Sharyne Peach, MD as Consulting Physician (Ophthalmology) Druscilla Brownie, MD as Consulting Physician (Dermatology) Irine Seal, MD as Consulting Physician (Urology) Larey Dresser, MD as Consulting Physician (Cardiology) Viona Gilmore, [facility] as Pharmacist (Pharmacist) Tat, Eustace Quail, DO as Consulting Physician (Neurology) Sharmon Revere as Physician Assistant (Cardiology)  Recent office visits: 10/08/20 Alysia Penna, MD: Patient presented for video visit for COVID infection.  06/29/20 Randel Pigg, LPN: Patient presented for AWV.  Recent consult visits: 11/20/20 Johny Shock, RN (cardiology): Patient presented for anti-coag visit.  INR 3.1, goal 2.5-3.5, continued warfarin 10 mg (5 mg x 2) every Sat; 7.5 mg (5 mg x 1.5) all other days.   11/05/20 [name], DPM (podiatry): Patient presented for callus debridement.  10/23/20 Richardson Dopp, PA-C (cardiology): Patient presented for aortic valve disorder follow up. Plan for stress test.  10/05/20 Nicholes Rough, PA-C (cardiothoracic surgery): Patient presented for thoracic aortic aneurysm follow up.  09-04-2020 Tat, Eustace Quail, DO (Neurology) - Patient presented for Parkinson's disease. Stopped TOLAK 4 % CREAM.  09/03/20 [name], DPM (podiatry): Patient presented for callus debridement.  Hospital visits: None in previous 6 months  Objective:  Lab Results  Component Value Date   CREATININE 0.97 05/05/2020   BUN 25 (H) 05/05/2020   GFR 69.58 05/05/2020   GFRNONAA 59 (L) 11/17/2016   GFRAA >60 11/17/2016   NA 138 05/05/2020   K 4.5 05/05/2020   CALCIUM 9.1 05/05/2020   CO2 32 05/05/2020   GLUCOSE 99 05/05/2020    Lab Results  Component Value Date/Time   HGBA1C 5.8 05/05/2020 08:56 AM   GFR 69.58 05/05/2020 08:56 AM   GFR 73.97 04/25/2019 10:26 AM    Last diabetic Eye exam: No results found for: HMDIABEYEEXA  Last diabetic Foot exam: No results found for: HMDIABFOOTEX   Lab Results  Component Value Date   CHOL 112 05/05/2020   HDL 42.40 05/05/2020   LDLCALC 58 05/05/2020   TRIG 58.0 05/05/2020   CHOLHDL 3 05/05/2020    Hepatic Function Latest Ref Rng & Units 05/05/2020 04/25/2019 02/26/2018  Total Protein 6.0 - 8.3 g/dL 6.4 6.6 6.5  Albumin 3.5 - 5.2 g/dL 4.0 4.5 4.2  AST 0 - 37 U/L '22 26 18  ' ALT 0 - 53 U/L 5 24  13  Alk Phosphatase 39 - 117 U/L 102 89 101  Total Bilirubin 0.2 - 1.2 mg/dL 1.4(H) 1.9(H) 1.9(H)  Bilirubin, Direct 0.0 - 0.3 mg/dL 0.3 0.3 0.3    Lab Results  Component Value Date/Time   TSH 0.85 05/05/2020 08:56 AM   TSH 1.34 04/25/2019 10:26 AM   FREET4 0.89 05/05/2020 08:56 AM   FREET4 0.92 04/25/2019 10:26 AM    CBC Latest  Ref Rng & Units 05/05/2020 04/25/2019 02/26/2018  WBC 4.0 - 10.5 K/uL 4.1 4.1 4.8  Hemoglobin 13.0 - 17.0 g/dL 13.5 14.5 14.2  Hematocrit 39.0 - 52.0 % 39.1 42.0 40.7  Platelets 150.0 - 400.0 K/uL 200.0 224.0 222.0    No results found for: VD25OH  Clinical ASCVD: Yes  The ASCVD Risk score (Arnett DK, et al., 2019) failed to calculate for the following reasons:   The 2019 ASCVD risk score is only valid for ages 58 to 39    Depression screen PHQ 2/9 06/29/2020 06/29/2020 04/25/2019  Decreased Interest 0 0 0  Down, Depressed, Hopeless 0 0 0  PHQ - 2 Score 0 0 0  Some recent data might be hidden     Social History   Tobacco Use  Smoking Status Former   Packs/day: 0.50   Years: 10.00   Pack years: 5.00   Types: Cigarettes   Quit date: 02/06/1972   Years since quitting: 48.8  Smokeless Tobacco Never   BP Readings from Last 3 Encounters:  11/27/20 136/65  10/23/20 (!) 124/42  10/05/20 137/60   Pulse Readings from Last 3 Encounters:  10/23/20 69  10/05/20 75  09/04/20 74   Wt Readings from Last 3 Encounters:  10/23/20 138 lb 12.8 oz (63 kg)  10/05/20 139 lb 12.8 oz (63.4 kg)  09/04/20 143 lb 6.4 oz (65 kg)   BMI Readings from Last 3 Encounters:  10/23/20 19.92 kg/m  10/05/20 20.06 kg/m  09/04/20 20.58 kg/m    Assessment/Interventions: Review of patient past medical history, allergies, medications, health status, including review of consultants reports, laboratory and other test data, was performed as part of comprehensive evaluation and provision of chronic care management services.   SDOH:  (Social Determinants of Health) assessments and interventions performed: No  SDOH Screenings   Alcohol Screen: Low Risk    Last Alcohol Screening Score (AUDIT): 0  Depression (PHQ2-9): Low Risk    PHQ-2 Score: 0  Financial Resource Strain: Not on file  Food Insecurity: No Food Insecurity   Worried About Charity fundraiser in the Last Year: Never true   Ran Out of Food in the  Last Year: Never true  Housing: Low Risk    Last Housing Risk Score: 0  Physical Activity: Sufficiently Active   Days of Exercise per Week: 4 days   Minutes of Exercise per Session: 60 min  Social Connections: Socially Integrated   Frequency of Communication with Friends and Family: Three times a week   Frequency of Social Gatherings with Friends and Family: Three times a week   Attends Religious Services: More than 4 times per year   Active Member of Clubs or Organizations: Yes   Attends Music therapist: More than 4 times per year   Marital Status: Married  Stress: No Stress Concern Present   Feeling of Stress : Not at all  Tobacco Use: Medium Risk   Smoking Tobacco Use: Former   Smokeless Tobacco Use: Never   Passive Exposure: Not on file  Transportation Needs: Not  on file    CCM Care Plan  Allergies  Allergen Reactions   Codeine     REACTION: N/V    Medications Reviewed Today     Reviewed by Ulice Brilliant (Pharmacy Technician) on 11/05/20 at 1046  Med List Status: <None>   Medication Order Taking? Sig Documenting Provider Last Dose Status Informant  ACETAMINOPHEN PO [id number] No Take 650 mg by mouth every 6 (six) hours as needed for pain.  [provider] Taking Active   amoxicillin (AMOXIL) 500 MG capsule [id number] No Take 4 capsules (2,000 mg) one hour prior to all dental visits. Richardson Dopp T, PA-C Taking Active   atorvastatin (LIPITOR) 40 MG tablet [id number] No TAKE 1 TABLET BY MOUTH EVERY DAY End, Christopher, MD Taking Active   betamethasone dipropionate 0.05 % cream [id number] No APPLY TO AFFECTED AREA OF SKIN UP TO 2 TIMES DAILY AS NEEDED. DO NOT APPLY TO FACE, GROIN, OR AXILLA [provider] Taking Active   carbidopa-levodopa (SINEMET IR) 25-100 MG tablet [id number] No TAKE 1 TABLET BY MOUTH 3 TIMES DAILY Tat, Eustace Quail, DO Taking Active   levothyroxine (SYNTHROID) 75 MCG tablet [id number] No TAKE 1 TABLET BY MOUTH ONCE  DAILY [name], MD Taking Active   magnesium hydroxide (MILK OF MAGNESIA) 400 MG/5ML suspension [id number] No Take 30 mLs by mouth daily as needed. Reported on 04/21/2015 [provider] Taking Active Self  Probiotic Product (PROBIOTIC-10 ULTIMATE) CAPS [id number] No Take 1 capsule by mouth daily. [provider] Taking Active   saline (AYR) GEL [id number] No Place 1 application into the nose in the morning and at bedtime.  [provider] Taking Active   sodium chloride (OCEAN) 0.65 % SOLN nasal spray [id number] No Place 1 spray into both nostrils as needed for congestion. Reported on 04/21/2015 [provider] Taking Active   triamterene-hydrochlorothiazide [facility]) 37.5-25 MG tablet [id number] No TAKE 1 TABLET BY MOUTH EVERY Ellan Lambert, MD Taking Active   warfarin (COUMADIN) 5 MG tablet [id number] No TAKE AS DIRECTED BY COUMADIN CLINIC End, Harrell Gave, MD Taking Active             Patient Active Problem List   Diagnosis Date Noted   COVID-19 virus infection 10/08/2020   Hypothyroidism 02/23/2018   Parkinson's disease ([place]) 01/26/2017   Right inguinal hernia 11/18/2016   Bilateral carotid artery stenosis 07/10/2016   Transient monocular blindness 04/07/2014   Encounter for therapeutic drug monitoring 02/27/2013   Head trauma 05/24/2011   Long term (current) use of anticoagulants 09/15/2010   S/P aortic valve replacement 05/20/2010   SHINGLES 11/26/2009   Thoracic aortic aneurysm without rupture 09/29/2008   AORTIC VALVE REPLACEMENT, HX OF 09/29/2008   Aortic valve disorder 09/20/2008   HEMORRHOIDS-EXTERNAL 09/12/2008   PERSONAL HX COLONIC POLYPS 09/12/2008   CONSTIPATION, CHRONIC 08/04/2008   Hyperlipidemia LDL goal <70 10/24/2007   Essential hypertension 10/24/2007   NEOPLASM, MALIGNANT, PROSTATE, HX OF 10/24/2007   MENIERE'S DISEASE, HX OF 10/24/2007    Immunization History  Administered Date(s) Administered   Fluad  Quad(high Dose 65+) 10/16/2018, 10/11/2019, 11/10/2020   Influenza Split 10/09/2013, 10/08/2014, 10/10/2016   Influenza Whole 11/10/2008, 10/10/2011   Influenza, High Dose Seasonal PF 09/21/2017   Influenza-Unspecified 09/17/2012, 10/08/2014, 10/21/2015   PFIZER(Purple Top)SARS-COV-2 Vaccination 01/24/2019, 02/13/2019, 11/08/2019   Pneumococcal Conjugate-13 03/07/2013   Pneumococcal Polysaccharide-23 01/19/2003   Td 01/19/2003   Tdap 05/21/2013   Zoster Recombinat (Shingrix) 02/23/2018, 06/26/2018   Patient has been having a lot  more nosebleeds for the last few weeks. He reports the last one he had was this past Wednesday. He does have a history of nosebleeds and reports it might be from the dry air and it usually gets worse in the fall and winter.  Patient has tried an air humidifier in the past but gets annoyed with these. Patient reports he uses saline nasal spray does help some.  Conditions to be addressed/monitored:  Hypertension, Hyperlipidemia, Hypothyroidism and Parkinson's disease, constipation, vertigo  Conditions addressed this visit: Aortic valve replacement, hypertension  Care Plan : CCM Pharmacy Care Plan  Updates made by Viona Gilmore, [place] since 11/27/2020 12:00 AM     Problem: Problem: Hypertension, Hyperlipidemia, Hypothyroidism and Parkinson's disease, constipation, vertigo      Long-Range Goal: Patient-Specific Goal   Start Date: 05/11/2020  Expected End Date: 05/11/2021  Recent Progress: On track  Priority: High  Note:   Current Barriers:  Unable to independently monitor therapeutic efficacy  Pharmacist Clinical Goal(s):  Patient will achieve adherence to monitoring guidelines and medication adherence to achieve therapeutic efficacy through collaboration with PharmD and provider.   Interventions: 1:1 collaboration with [name], MD regarding development and update of comprehensive plan of care as evidenced by provider attestation and  co-signature Inter-disciplinary care team collaboration (see longitudinal plan of care) Comprehensive medication review performed; medication list updated in electronic medical record  Hypertension (BP goal <140/90) -Controlled -Current treatment: Triamterene/HCTZ 37.5-25 mg 1 tablet every morning -Medications previously tried: none  -Current home readings: 136/65, 141/68, 130s-140s usually -Current dietary habits: patient rarely uses salt, only for a bit of seasoning on baked potato or egg -Current exercise habits: patient is very active and exercises every day; strength and boxing classes (5 exercises classes per week) and doing walking -Denies hypotensive/hypertensive symptoms -Educated on Exercise goal of 150 minutes per week; Importance of home blood pressure monitoring; Proper BP monitoring technique; -Counseled to monitor BP at home weekly, document, and provide log at future appointments -Counseled on diet and exercise extensively Recommended to continue current medication  Hyperlipidemia: (LDL goal < 70) -Controlled -Current treatment: Atorvastatin 40 mg 1 tablet daily -Medications previously tried: none  -Current dietary patterns: did not discuss -Current exercise habits: patient is very active and exercises almost every day -Educated on Cholesterol goals;  Importance of limiting foods high in cholesterol; Exercise goal of 150 minutes per week; -Counseled on diet and exercise extensively Recommended to continue current medication  Hypothyroidism (Goal: TSH 0.35-4.5) -Controlled -Current treatment  Levothyroxine 75 mcg 1 tablet daily -Medications previously tried: none  -Recommended to continue current medication  Parkinson's disease (Goal: minimize symptoms) -Controlled -Current treatment  Carbidopa-levodopa 25-100 mg 1 tablet 3 times daily -Medications previously tried: none  -Recommended to continue current medication Counseled on side effects of  carbidopa-levodopa   Aortic valve replacement  (Goal: prevent blood clots) -Controlled -Current treatment  Warfarin 5 mg tablet, 10 mg on Sat and 7.5 mg on all other days -Medications previously tried: none  -Recommended to continue current medication  Vertigo (Goal: minimize symptoms) -Controlled -Current treatment  Meclizine 25 mg 1 tablet as needed  -Medications previously tried: none  -Recommended to continue current medication Counseled on relaxation techniques (deep breathing, yoga) as sometimes vertigo can be brought on by stress/anxiety  Constipation (Goal: regular bowel movements) -Controlled -Current treatment  Milk of magnesia 10 mLs as needed for constipation -Medications previously tried: n/a  -Educated on incorporating more fiber in his diet and drinking plenty of water  to prevent constipation; recommended trying a stool softener or Miralax   Health Maintenance -Vaccine gaps: none -Current therapy:  Acetaminophen 650 mg 1 tablet as needed for pain - 4 most in one day Amoxicillin 500 mg 4 capsules prior to dental work Probiotic 1 capsule daily Betamethasone 0.05% cream apply to affected area up to twice daily Saline nasal spray Triamcinolone 0.1% ointment apply to affected area twice daily -Educated on Cost vs benefit of each product must be carefully weighed by individual consumer -Patient is satisfied with current therapy and denies issues -Recommended to continue current medication  Patient Goals/Self-Care Activities Patient will:  - take medications as prescribed check blood pressure weekly, document, and provide at future appointments target a minimum of 150 minutes of moderate intensity exercise weekly  Follow Up Plan: Telephone follow up appointment with care management team member scheduled for: 6 months        Medication Assistance: None required.  Patient affirms current coverage meets needs.  Compliance/Adherence/Medication fill  history: Care Gaps: COVID booster (planning on doing it this month) BP- 124/42 (10/7)  Star-Rating Drugs: Atorvastatin (Lipitor) 40 mg - Last filled 11/12/2020 30 DS  at [place]  Patient's preferred pharmacy is:  [facility] [place], [place] - [address] [place] [address] Phone: [phone] Fax: [phone]  Uses pill box? Yes Pt endorses 100% compliance  We discussed: Current pharmacy is preferred with insurance plan and patient is satisfied with pharmacy services Patient decided to: Continue current medication management strategy  Care Plan and Follow Up Patient Decision:  Patient agrees to Care Plan and Follow-up.  Plan: Telephone follow up appointment with care management team member scheduled for:  1 year  Jeni Salles, PharmD [place] Pharmacist [employer] at [place] [phone]

## 2020-11-27 ENCOUNTER — Telehealth: Payer: Medicare Other

## 2020-11-27 ENCOUNTER — Ambulatory Visit (INDEPENDENT_AMBULATORY_CARE_PROVIDER_SITE_OTHER): Payer: Medicare Other | Admitting: Pharmacist

## 2020-11-27 VITALS — BP 136/65

## 2020-11-27 DIAGNOSIS — I10 Essential (primary) hypertension: Secondary | ICD-10-CM

## 2020-11-27 DIAGNOSIS — E785 Hyperlipidemia, unspecified: Secondary | ICD-10-CM

## 2020-11-27 NOTE — Patient Instructions (Signed)
Hi May,   It was great to speak with you again!  Please reach out to me if you have any questions or need anything before our follow up!  Best, Maddie  Jeni Salles, PharmD, [place] [facility] [place]   Visit Information   Goals Addressed   None    Patient Care Plan: CCM Pharmacy Care Plan     Problem Identified: Problem: Hypertension, Hyperlipidemia, Hypothyroidism and Parkinson's disease, constipation, vertigo      Long-Range Goal: Patient-Specific Goal   Start Date: 05/11/2020  Expected End Date: 05/11/2021  Recent Progress: On track  Priority: High  Note:   Current Barriers:  Unable to independently monitor therapeutic efficacy  Pharmacist Clinical Goal(s):  Patient will achieve adherence to monitoring guidelines and medication adherence to achieve therapeutic efficacy through collaboration with PharmD and provider.   Interventions: 1:1 collaboration with [name], MD regarding development and update of comprehensive plan of care as evidenced by provider attestation and co-signature Inter-disciplinary care team collaboration (see longitudinal plan of care) Comprehensive medication review performed; medication list updated in electronic medical record  Hypertension (BP goal <140/90) -Controlled -Current treatment: Triamterene/HCTZ 37.5-25 mg 1 tablet every morning -Medications previously tried: none  -Current home readings: 136/65, 141/68, 130s-140s usually -Current dietary habits: patient rarely uses salt, only for a bit of seasoning on baked potato or egg -Current exercise habits: patient is very active and exercises every day; strength and boxing classes (5 exercises classes per week) and doing walking -Denies hypotensive/hypertensive symptoms -Educated on Exercise goal of 150 minutes per week; Importance of home blood pressure monitoring; Proper BP monitoring technique; -Counseled to monitor BP at home  weekly, document, and provide log at future appointments -Counseled on diet and exercise extensively Recommended to continue current medication  Hyperlipidemia: (LDL goal < 70) -Controlled -Current treatment: Atorvastatin 40 mg 1 tablet daily -Medications previously tried: none  -Current dietary patterns: did not discuss -Current exercise habits: patient is very active and exercises almost every day -Educated on Cholesterol goals;  Importance of limiting foods high in cholesterol; Exercise goal of 150 minutes per week; -Counseled on diet and exercise extensively Recommended to continue current medication  Hypothyroidism (Goal: TSH 0.35-4.5) -Controlled -Current treatment  Levothyroxine 75 mcg 1 tablet daily -Medications previously tried: none  -Recommended to continue current medication  Parkinson's disease (Goal: minimize symptoms) -Controlled -Current treatment  Carbidopa-levodopa 25-100 mg 1 tablet 3 times daily -Medications previously tried: none  -Recommended to continue current medication Counseled on side effects of carbidopa-levodopa   Aortic valve replacement  (Goal: prevent blood clots) -Controlled -Current treatment  Warfarin 5 mg tablet, 10 mg on Sat and 7.5 mg on all other days -Medications previously tried: none  -Recommended to continue current medication  Vertigo (Goal: minimize symptoms) -Controlled -Current treatment  Meclizine 25 mg 1 tablet as needed  -Medications previously tried: none  -Recommended to continue current medication Counseled on relaxation techniques (deep breathing, yoga) as sometimes vertigo can be brought on by stress/anxiety  Constipation (Goal: regular bowel movements) -Controlled -Current treatment  Milk of magnesia 10 mLs as needed for constipation -Medications previously tried: n/a  -Educated on incorporating more fiber in his diet and drinking plenty of water to prevent constipation; recommended trying a stool softener  or Miralax   Health Maintenance -Vaccine gaps: none -Current therapy:  Acetaminophen 650 mg 1 tablet as needed for pain - 4 most in one day Amoxicillin 500 mg 4 capsules prior to dental work Probiotic  1 capsule daily Betamethasone 0.05% cream apply to affected area up to twice daily Saline nasal spray Triamcinolone 0.1% ointment apply to affected area twice daily -Educated on Cost vs benefit of each product must be carefully weighed by individual consumer -Patient is satisfied with current therapy and denies issues -Recommended to continue current medication  Patient Goals/Self-Care Activities Patient will:  - take medications as prescribed check blood pressure weekly, document, and provide at future appointments target a minimum of 150 minutes of moderate intensity exercise weekly  Follow Up Plan: Telephone follow up appointment with care management team member scheduled for: 6 months       Patient verbalizes understanding of instructions provided today and agrees to view in [place].  Telephone follow up appointment with pharmacy team member scheduled for: 1 year  Viona Gilmore, [facility]

## 2020-11-30 ENCOUNTER — Ambulatory Visit (HOSPITAL_COMMUNITY): Payer: Medicare Other | Attending: Internal Medicine

## 2020-11-30 ENCOUNTER — Other Ambulatory Visit (HOSPITAL_COMMUNITY): Payer: Self-pay | Admitting: Physician Assistant

## 2020-11-30 ENCOUNTER — Other Ambulatory Visit: Payer: Self-pay | Admitting: Thoracic Surgery (Cardiothoracic Vascular Surgery)

## 2020-11-30 ENCOUNTER — Other Ambulatory Visit: Payer: Self-pay

## 2020-11-30 ENCOUNTER — Encounter (HOSPITAL_COMMUNITY): Payer: Medicare Other

## 2020-11-30 DIAGNOSIS — I447 Left bundle-branch block, unspecified: Secondary | ICD-10-CM | POA: Diagnosis not present

## 2020-11-30 DIAGNOSIS — R911 Solitary pulmonary nodule: Secondary | ICD-10-CM

## 2020-11-30 LAB — MYOCARDIAL PERFUSION IMAGING
LV dias vol: 116 mL (ref 62–150)
LV sys vol: 54 mL
Nuc Stress EF: 54 %
Peak HR: 70 {beats}/min
Rest HR: 58 {beats}/min
Rest Nuclear Isotope Dose: 8.1 mCi
SDS: 3
SRS: 1
SSS: 4
Stress Nuclear Isotope Dose: 28.4 mCi
TID: 0.97

## 2020-11-30 MED ORDER — TECHNETIUM TC 99M TETROFOSMIN IV KIT
8.1000 | PACK | Freq: Once | INTRAVENOUS | Status: AC | PRN
  Administered 2020-11-30: 8.1 via INTRAVENOUS
  Filled 2020-11-30: qty 9

## 2020-11-30 MED ORDER — TECHNETIUM TC 99M TETROFOSMIN IV KIT
27.8000 | PACK | Freq: Once | INTRAVENOUS | Status: AC | PRN
  Administered 2020-11-30: 27.8 via INTRAVENOUS
  Filled 2020-11-30: qty 28

## 2020-11-30 MED ORDER — REGADENOSON 0.4 MG/5ML IV SOLN
0.4000 mg | Freq: Once | INTRAVENOUS | Status: AC
  Administered 2020-11-30: 0.4 mg via INTRAVENOUS

## 2020-12-01 ENCOUNTER — Encounter: Payer: Self-pay | Admitting: Physician Assistant

## 2020-12-01 DIAGNOSIS — I447 Left bundle-branch block, unspecified: Secondary | ICD-10-CM | POA: Insufficient documentation

## 2020-12-03 ENCOUNTER — Telehealth: Payer: Self-pay | Admitting: Pharmacist

## 2020-12-03 NOTE — Telephone Encounter (Signed)
-----   Message from Sherren Mocha, MD sent at 12/01/2020 12:18 PM EST ----- I think that's fine. Is he taking ASA? If so we could have him stop ASA and leave INR goal the same. If not, probably best to lower the goal as you suggest. Thx Billee Balcerzak ----- Message ----- From: Ramond Dial, RPH-CPP Sent: 12/01/2020   8:33 AM EST To: Sherren Mocha, MD  Dr. Burt Knack,  Currently David Benton INR goal is 2.5-3.5. He has a mechanical aortic valve without any risk factors. It looks like when his INR goal was first set, it was done incorrectly. We discovered this in 2018, but since he was not having any issues and he had been at that goal for awhile, it was not changed.  A pharmacist from his PCP office reached out to me about his goal recently. Looks like he reported some nose bleeds recently. I was wondering what you thought about decreasing his INR goal to 2-3?  Thanks  [employer]

## 2020-12-03 NOTE — Telephone Encounter (Signed)
Pt called back. He is not taking ASA. Advised that we would be changing his INR goal to 2-3 based off of the guidelines. He says he has been using neosporin and a nasal spray in his nose at night and has not had a nose bleed in 1 week. Will still go ahead and decrease INR goal to 2-3. Advised that at next INR check if >3 then dose will be adjusted.

## 2020-12-14 ENCOUNTER — Other Ambulatory Visit: Payer: Self-pay | Admitting: Internal Medicine

## 2020-12-16 ENCOUNTER — Ambulatory Visit: Payer: Medicare Other | Admitting: *Deleted

## 2020-12-16 ENCOUNTER — Other Ambulatory Visit: Payer: Self-pay

## 2020-12-16 DIAGNOSIS — Z5181 Encounter for therapeutic drug level monitoring: Secondary | ICD-10-CM

## 2020-12-16 DIAGNOSIS — I10 Essential (primary) hypertension: Secondary | ICD-10-CM

## 2020-12-16 DIAGNOSIS — E785 Hyperlipidemia, unspecified: Secondary | ICD-10-CM

## 2020-12-16 LAB — POCT INR: INR: 3 (ref 2.0–3.0)

## 2020-12-16 NOTE — Patient Instructions (Signed)
Description   Start taking Warfarin 1.5 tablets every day. Recheck INR in 4 weeks. Call Coumadin Clinic # [phone] with any concerns

## 2021-01-01 ENCOUNTER — Ambulatory Visit
Admission: RE | Admit: 2021-01-01 | Discharge: 2021-01-01 | Disposition: A | Discharge status: Home / Self Care | Payer: Medicare Other | Source: Ambulatory Visit | Attending: Thoracic Surgery (Cardiothoracic Vascular Surgery) | Admitting: Thoracic Surgery (Cardiothoracic Vascular Surgery)

## 2021-01-01 ENCOUNTER — Other Ambulatory Visit: Payer: Self-pay | Admitting: Thoracic Surgery (Cardiothoracic Vascular Surgery)

## 2021-01-01 DIAGNOSIS — C221 Intrahepatic bile duct carcinoma: Secondary | ICD-10-CM

## 2021-01-01 DIAGNOSIS — R911 Solitary pulmonary nodule: Secondary | ICD-10-CM

## 2021-01-01 DIAGNOSIS — I7121 Aneurysm of the ascending aorta, without rupture: Secondary | ICD-10-CM | POA: Diagnosis not present

## 2021-01-01 DIAGNOSIS — I700 Atherosclerosis of aorta: Secondary | ICD-10-CM | POA: Diagnosis not present

## 2021-01-01 DIAGNOSIS — J439 Emphysema, unspecified: Secondary | ICD-10-CM | POA: Diagnosis not present

## 2021-01-01 DIAGNOSIS — R918 Other nonspecific abnormal finding of lung field: Secondary | ICD-10-CM | POA: Diagnosis not present

## 2021-01-05 ENCOUNTER — Ambulatory Visit: Payer: Medicare Other | Admitting: Thoracic Surgery (Cardiothoracic Vascular Surgery)

## 2021-01-07 ENCOUNTER — Other Ambulatory Visit: Payer: Self-pay

## 2021-01-07 ENCOUNTER — Ambulatory Visit: Payer: Medicare Other | Admitting: Podiatry

## 2021-01-07 DIAGNOSIS — Z7901 Long term (current) use of anticoagulants: Secondary | ICD-10-CM

## 2021-01-07 DIAGNOSIS — L84 Corns and callosities: Secondary | ICD-10-CM

## 2021-01-07 NOTE — Progress Notes (Signed)
°  Subjective:  Patient ID: David Benton, male    DOB: 13-Dec-1929,  MRN: [id number]  Chief Complaint  Patient presents with   Callouses      9 week follow up    87 y.o. male returns with the above complaint. History confirmed with patient.  He is doing quite well with the urea cream and padding.  On the right foot submet 5 B/L now and the hallux  Objective:  Physical Exam: warm, good capillary refill, no trophic changes or ulcerative lesions, normal DP and PT pulses and normal sensory exam.  Left Foot: Submet 5 large hyperkeratosis, improved since last visit in character and quality Right Foot: Submet 5 and submet 1 large hyperkeratoses, both improved and softer than previously   Assessment:   1. Callus of foot   2. Long term (current) use of anticoagulants       Plan:  Patient was evaluated and treated and all questions answered.  All symptomatic hyperkeratoses were safely debrided with a sterile #15 blade to patient's level of comfort without incident. We discussed preventative and palliative care of these lesions including supportive and accommodative shoegear, padding, prefabricated and custom molded accommodative orthoses, use of a pumice stone and lotions/creams daily.  Continue use of dancers pads and urea cream 40%   Return in about 9 weeks (around 03-29-2021) for callus care .

## 2021-01-12 ENCOUNTER — Other Ambulatory Visit: Payer: Self-pay | Admitting: Internal Medicine

## 2021-01-12 ENCOUNTER — Other Ambulatory Visit: Payer: Self-pay | Admitting: Thoracic Surgery (Cardiothoracic Vascular Surgery)

## 2021-01-12 NOTE — Telephone Encounter (Signed)
Refill Request.  

## 2021-01-13 ENCOUNTER — Ambulatory Visit: Payer: Medicare Other

## 2021-01-13 ENCOUNTER — Other Ambulatory Visit: Payer: Self-pay

## 2021-01-13 DIAGNOSIS — Z5181 Encounter for therapeutic drug level monitoring: Secondary | ICD-10-CM | POA: Diagnosis not present

## 2021-01-13 DIAGNOSIS — Z7901 Long term (current) use of anticoagulants: Secondary | ICD-10-CM

## 2021-01-13 DIAGNOSIS — I359 Nonrheumatic aortic valve disorder, unspecified: Secondary | ICD-10-CM

## 2021-01-13 LAB — SARS CORONAVIRUS 2 (TAT 6-24 HRS): SARS Coronavirus 2: NEGATIVE

## 2021-01-13 LAB — POCT INR: INR: 2.5 (ref 2.0–3.0)

## 2021-01-13 NOTE — Patient Instructions (Signed)
Description   Continue on same dosage of Warfarin 1.5 tablets every day. Recheck INR in 4 weeks. Call [facility] # [phone] with any concerns

## 2021-01-14 ENCOUNTER — Ambulatory Visit (HOSPITAL_COMMUNITY)
Admission: RE | Admit: 2021-01-14 | Discharge: 2021-01-14 | Disposition: A | Discharge status: Home / Self Care | Payer: Medicare Other | Source: Ambulatory Visit | Attending: Thoracic Surgery (Cardiothoracic Vascular Surgery) | Admitting: Thoracic Surgery (Cardiothoracic Vascular Surgery)

## 2021-01-14 DIAGNOSIS — R0609 Other forms of dyspnea: Secondary | ICD-10-CM | POA: Insufficient documentation

## 2021-01-14 DIAGNOSIS — Z87891 Personal history of nicotine dependence: Secondary | ICD-10-CM | POA: Diagnosis not present

## 2021-01-14 DIAGNOSIS — R911 Solitary pulmonary nodule: Secondary | ICD-10-CM | POA: Insufficient documentation

## 2021-01-14 LAB — PULMONARY FUNCTION TEST
DL/VA % pred: 81 %
DL/VA: 3.08 ml/min/mmHg/L
DLCO unc % pred: 81 %
DLCO unc: 18.83 ml/min/mmHg
FEF 25-75 Post: 2.06 L/sec
FEF 25-75 Pre: 1.62 L/sec
FEF2575-%Change-Post: 27 %
FEF2575-%Pred-Post: 134 %
FEF2575-%Pred-Pre: 106 %
FEV1-%Change-Post: 4 %
FEV1-%Pred-Post: 120 %
FEV1-%Pred-Pre: 115 %
FEV1-Post: 2.99 L
FEV1-Pre: 2.87 L
FEV1FVC-%Change-Post: 1 %
FEV1FVC-%Pred-Pre: 100 %
FEV6-%Change-Post: 4 %
FEV6-%Pred-Post: 120 %
FEV6-%Pred-Pre: 115 %
FEV6-Post: 4.03 L
FEV6-Pre: 3.87 L
FEV6FVC-%Change-Post: 1 %
FEV6FVC-%Pred-Post: 104 %
FEV6FVC-%Pred-Pre: 102 %
FVC-%Change-Post: 2 %
FVC-%Pred-Post: 115 %
FVC-%Pred-Pre: 113 %
FVC-Post: 4.2 L
FVC-Pre: 4.1 L
Post FEV1/FVC ratio: 71 %
Post FEV6/FVC ratio: 96 %
Pre FEV1/FVC ratio: 70 %
Pre FEV6/FVC Ratio: 94 %
RV % pred: 87 %
RV: 2.5 L
TLC % pred: 93 %
TLC: 6.61 L

## 2021-01-14 MED ORDER — ALBUTEROL SULFATE (2.5 MG/3ML) 0.083% IN NEBU
2.5000 mg | INHALATION_SOLUTION | Freq: Once | RESPIRATORY_TRACT | Status: AC
  Administered 2021-01-14: 12:00:00 2.5 mg via RESPIRATORY_TRACT

## 2021-01-19 ENCOUNTER — Encounter: Payer: Self-pay | Admitting: Thoracic Surgery (Cardiothoracic Vascular Surgery)

## 2021-01-19 ENCOUNTER — Other Ambulatory Visit: Payer: Self-pay | Admitting: *Deleted

## 2021-01-19 ENCOUNTER — Ambulatory Visit: Payer: Medicare Other | Admitting: Thoracic Surgery (Cardiothoracic Vascular Surgery)

## 2021-01-19 ENCOUNTER — Other Ambulatory Visit: Payer: Self-pay | Admitting: Family Medicine

## 2021-01-19 ENCOUNTER — Other Ambulatory Visit: Payer: Self-pay

## 2021-01-19 VITALS — BP 133/64 | HR 86 | Resp 20 | Ht 70.0 in | Wt 137.0 lb

## 2021-01-19 DIAGNOSIS — I712 Thoracic aortic aneurysm, without rupture, unspecified: Secondary | ICD-10-CM

## 2021-01-19 DIAGNOSIS — R911 Solitary pulmonary nodule: Secondary | ICD-10-CM

## 2021-01-19 NOTE — Progress Notes (Signed)
[address]       [place] [id number]             [phone]     HPI: [name] returns to discuss the results of his CT chest.  David Benton is an 88 year old man with a history of aortic valve replacement for aortic stenosis in 1991 by Dr. Redmond Pulling.  Past medical history is also significant for an ascending aneurysm, Parkinson's, bilateral carotid stenosis, TIA, hypertension, hypothyroidism, and hyperlipidemia.  He has been followed since 2009 for an ascending aneurysm.  He was followed for that by [name].  He has been having annual CT scans.  In September he returned with a repeat CT which showed the aneurysm was stable but there was a new right lower lobe lung nodule.  He now returns for 83-month follow-up.  He has been feeling well.  He has some issues with constipation but no other abdominal complaints.  He is not having chest pain or shortness of breath.  He remains active.  Past Medical History:  Diagnosis Date   Bicuspid AS s/p mechanical AVR in 1991    Echocardiogram 10/22: EF 55-60, no RWMA, mod LVH, Gr 1 DD, normal RVSF, mild LAE, mild MR, AVR functioning normally with trivial AI, aortic root 43 mm, ascending aorta 45 mm, RVSP 40.7 mmHg   Bilateral carotid artery stenosis 07/10/2016   Carotid US 10/2018: Bilateral ICA 1-39; right subclavian 1.7 cm (no change since 03/2017) // Carotid US 10/21: Bilat ICA 1-39//Bilateral ICA 10/22: R 1-39; L 1-39   External hemorrhoids without mention of complication    Heart valve replaced by other means    LBBB (left bundle branch block)    Myoview 11/22: EF 54, PVCs, no ischemia or scar; low risk   Long term (current) use of anticoagulants 09/15/2010   Meniere's disease    Other and unspecified hyperlipidemia    Other constipation    Parkinson disease ([place]) 02/2016   no meds, sees [name]    Personal history of colonic polyps    Personal history of malignant neoplasm of prostate    Right inguinal hernia  11/18/2016   Shingles    SHINGLES 11/26/2009   Thoracic aneurysm without mention of rupture    followed by [name] with yearly CT scans    Unspecified essential hypertension    Past Surgical History:  Procedure Laterality Date   AORTIC VALVE REPLACEMENT  1991    st. jude's valve, per [name]    CATARACT EXTRACTION Left 12/2018   COLONOSCOPY  06-28-10   per Dr. Henrene Pastor, benign polyps, no further scopes needed    [place] Right 11/18/2016   Procedure: OPEN REPAIR RIGHT INGUINAL HERNIA WITH MESH;  Surgeon: Fanny Skates, MD;  Location: [facility];  Service: General;  Laterality: Right;   INSERTION OF MESH Right 11/18/2016   Procedure: INSERTION OF MESH;  Surgeon: Fanny Skates, MD;  Location: [facility];  Service: General;  Laterality: Right;   PROSTATECTOMY  1995   per Dr. Roni Bread    TONSILLECTOMY      Current Outpatient Medications  Medication Sig Dispense Refill   ACETAMINOPHEN PO Take 650 mg by mouth every 6 (six) hours as needed for pain.      amoxicillin (AMOXIL) 500 MG capsule Take 4 capsules (2,000 mg) one hour prior to all dental visits. 12 capsule 6   atorvastatin (LIPITOR)  40 MG tablet TAKE 1 TABLET BY MOUTH EVERY DAY 30 tablet 11   betamethasone dipropionate 0.05 % cream APPLY TO AFFECTED AREA OF SKIN UP TO 2 TIMES DAILY AS NEEDED. DO NOT APPLY TO FACE, GROIN, OR AXILLA     carbidopa-levodopa (SINEMET IR) 25-100 MG tablet TAKE 1 TABLET BY MOUTH 3 TIMES DAILY 90 tablet 0   levothyroxine (SYNTHROID) 75 MCG tablet TAKE 1 TABLET BY MOUTH ONCE DAILY 90 tablet 1   magnesium hydroxide (MILK OF MAGNESIA) 400 MG/5ML suspension Take 30 mLs by mouth daily as needed. Reported on 04/21/2015     Probiotic Product (PROBIOTIC-10 ULTIMATE) CAPS Take 1 capsule by mouth daily.     saline (AYR) GEL Place 1 application into the nose in the morning and at bedtime.      sodium chloride (OCEAN) 0.65 % SOLN nasal spray Place 1  spray into both nostrils as needed for congestion. Reported on 04/21/2015     triamterene-hydrochlorothiazide (MAXZIDE-25) 37.5-25 MG tablet TAKE 1 TABLET BY MOUTH EVERY MORNING 90 tablet 0   warfarin (COUMADIN) 5 MG tablet TAKE AS DIRECTED BY COUMADIN CLINIC 50 tablet 3   No current facility-administered medications for this visit.    Physical Exam BP 133/64    Pulse 86    Resp 20    Ht 5\' 10"  (1.778 m)    Wt 137 lb (62.1 kg)    SpO2 98% Comment: RA   BMI 19.55 kg/m   88 year old man in no acute distress Alert and oriented x3, positive tremor Cardiac regular rate and rhythm, good valve click Lungs diminished but otherwise clear bilaterally No cervical or supraclavicular adenopathy No peripheral edema  Diagnostic Tests: CT CHEST WITHOUT CONTRAST   TECHNIQUE: Multidetector CT imaging of the chest was performed following the standard protocol without IV contrast.   COMPARISON:  10/05/2020   FINDINGS: Cardiovascular: Aortic atherosclerosis. Ascending aortic aneurysm including at 4.5 cm on 200/10, similar to 4.4 cm on the prior.   Mild cardiomegaly. Aortic valve repair. Native coronary artery atherosclerosis.   Mediastinum/Nodes: No mediastinal or definite hilar adenopathy, given limitations of unenhanced CT.   Lungs/Pleura: No pleural fluid.  Mild centrilobular emphysema.   The pleural-based right lower lobe pulmonary nodule measures 1.5 x 1.2 cm on 132/8 versus 1.2 x 0.9 cm on the prior exam (when remeasured). 1.7 cm craniocaudal on coronal image 100 today versus 1.3 cm when remeasured in a similar fashion on the prior exam.   Posterior left upper lobe calcified granuloma.   2-3 mm posterior left upper lobe pulmonary nodules including on 50/8 and 32/8 are similar on the prior.   Upper Abdomen: Intrahepatic biliary duct dilatation is mild-to-moderate, including in the right hepatic lobe on 376/10. Central hepatic hypoattenuation is ill-defined including on 163/2 at on  the order of 3.3 cm. Noncalcified stones are identified either within the gallbladder or a markedly dilated common duct on 426/10; the gallbladder may be seen separately on 437/10.   Normal imaged portions of the spleen, stomach, pancreas, adrenal glands, kidneys.   Musculoskeletal: Prior median sternotomy. S shaped thoracic spine curvature.   IMPRESSION: 1. The right lower lobe pulmonary nodule has enlarged, suspicious for primary bronchogenic carcinoma. 2. Abnormal appearance of the liver/biliary tree, incompletely and suboptimally evaluated. Intrahepatic and possible extrahepatic biliary duct dilatation, with central hepatic hypoattenuation. Findings are suspicious for either a cholangiocarcinoma or distal common duct obstruction. Recommend dedicated pre and post contrast abdominal CT or MRI. Given patient age, CT would likely be more  practical. 3. No thoracic adenopathy. 4. Similar ascending aortic aneurysm, 4.5 cm. Ascending thoracic aortic aneurysm. Recommend semi-annual imaging followup by CTA or MRA and referral to cardiothoracic surgery if not already obtained. This recommendation follows 2010 ACCF/AHA/AATS/ACR/ASA/SCA/SCAI/SIR/STS/SVM Guidelines for the Diagnosis and Management of Patients With Thoracic Aortic Disease. Circulation. 2010; 121: P379-K240. Aortic aneurysm NOS (ICD10-I71.9) 5. Aortic atherosclerosis (ICD10-I70.0), coronary artery atherosclerosis and emphysema (ICD10-J43.9).   These results will be called to the ordering clinician or representative by the Radiologist Assistant, and communication documented in the PACS or [employer].     Electronically Signed   By: Abigail Miyamoto M.D.   On: 01/01/2021 14:43   I personally reviewed the CT images.  Stable 4.5 cm ascending aneurysm and aortic atherosclerosis.  Interval increase in size of right lower lobe lung nodule.  Newly noted biliary dilatation.  Impression: David Benton is an 88 year old man with  a history of aortic valve replacement with mechanical valve, ascending aneurysm, Parkinson's, bilateral carotid stenosis, TIA, hypertension, hypothyroidism, and hyperlipidemia.  Ascending aneurysm-4.5 cm.  Stable over time.  Right lower lobe lung nodule-first noted on CT about 3 months ago.  There is been an interval increase in size.  Highly concerning for a new primary bronchogenic carcinoma.  He needs a PET/CT to guide initial diagnostic work-up.  Management complicated by mechanical valve, anticoagulation, and possible hepatobiliary issues.  Dilated bile ducts-not noted on his previous CT but noted on CT from December.  Will order pre and postcontrast abdominal CT as recommended by radiology.  Will refer to gastroenterology to see if any additional work-up is necessary.  Plan: Will obtain abdominal CT pre and postcontrast to evaluate biliary ductal dilatation PET/CT for new lung nodule to guide initial diagnostic work-up Referral to gastroenterology for consideration for further work-up Return in 3 weeks to discuss management of lung nodule.  Melrose Nakayama, MD [facility] [phone]

## 2021-01-28 ENCOUNTER — Ambulatory Visit
Admission: RE | Admit: 2021-01-28 | Discharge: 2021-01-28 | Disposition: A | Discharge status: Home / Self Care | Payer: Medicare Other | Source: Ambulatory Visit | Attending: Thoracic Surgery (Cardiothoracic Vascular Surgery) | Admitting: Thoracic Surgery (Cardiothoracic Vascular Surgery)

## 2021-01-28 DIAGNOSIS — C221 Intrahepatic bile duct carcinoma: Secondary | ICD-10-CM

## 2021-01-28 DIAGNOSIS — K828 Other specified diseases of gallbladder: Secondary | ICD-10-CM | POA: Diagnosis not present

## 2021-01-28 DIAGNOSIS — I700 Atherosclerosis of aorta: Secondary | ICD-10-CM | POA: Diagnosis not present

## 2021-01-28 MED ORDER — IOPAMIDOL (ISOVUE-370) INJECTION 76%
80.0000 mL | Freq: Once | INTRAVENOUS | Status: AC | PRN
  Administered 2021-01-28: 80 mL via INTRAVENOUS

## 2021-01-28 NOTE — Progress Notes (Signed)
Cardiology Office Note:    Date:  01/29/2021   ID:  Owens Loffler, DOB 1929/07/05, MRN [id number]  PCP:  [name], MD  [facility] Providers Cardiologist:  Sherren Mocha, MD Cardiology APP:  Sharmon Revere    Referring MD: [name], MD   Chief Complaint:  Follow-up on LBBB    Patient Profile: Bicuspid Aortic Valve Stenosis S/p St Jude Mechanical AVR in 1991 No longer on ASA due to hx of spontaneous forearm hematoma  Echo 10/22: EF 55-60, GR 1 DD, AVR okay Thoracic Aortic Aneurysm  Followed by [name] >> [name] CT 08/2018: 4.6 x 4.5 cm CT 9/21: 4.6x4.5 cm CT 9/22: 4.5 cm Carotid Artery Dz  Hypertension  Hyperlipidemia  Parkinson's Disease Aortic atherosclerosis  Prostate CA Meniere's Dz Lung nodule Left Bundle Branch Block  Myoview 10/22: low risk    Prior CV studies: Myoview 11/30/2020 No ischemia or scar, EF 54; low risk  Echocardiogram 11/13/2020 EF 55-60, no RWMA, GR 1 DD, moderate LVH, normal RVSF, mildly elevated PASP, mild LAE, mild MR AVR with trivial AI and no stenosis, aortic root 43 mm, ascending aorta 45 mm  Carotid US 10/27/2020 Bilateral ICA 1-39    Echocardiogram 02/09/2016 Mild LVH, severe focal basal septal hypertrophy, EF 60-65, no RWMA, Gr 1 DD, mechanical AVR w/ mean gradient 7, ascending aorta 45 mm (mildly dilated), severe LAE, aneurysmal IAS, mild TR, PASP 37    History of Present Illness:   David Benton is a 88 y.o. male with the above problem list.  He was previously followed by [name], [name] and then [name].  I have seen him primarily since [name] transitioned to [place].  I saw him last in 10/22.  He had a new left bundle branch block on EKG.  He was asymptomatic but I set him up for an echocardiogram and Myoview.  The Myoview demonstrated no ischemia was low risk.  His echocardiogram demonstrated normal LV function.  He has been followed by Dr. Roxan Hockey for a lung nodule.  Recent follow-up  imaging demonstrated increased size concerning for new primary bronchogenic carcinoma.  He also had dilated bile ducts on his CT. He had a follow-up abdominal/pelvic CTA yesterday.  He is being referred to gastroenterology and has a PET scan pending to evaluate his lung nodule.  He returns for cardiology follow-up.  He is here alone.  He continues to remain active.  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  He does exercises every day.  He has not had any chest pain, shortness of breath, syncope, orthopnea, leg edema.    Past Medical History:  Diagnosis Date   Bicuspid AS s/p mechanical AVR in 1991    Echocardiogram 10/22: EF 55-60, no RWMA, mod LVH, Gr 1 DD, normal RVSF, mild LAE, mild MR, AVR functioning normally with trivial AI, aortic root 43 mm, ascending aorta 45 mm, RVSP 40.7 mmHg   Bilateral carotid artery stenosis 07/10/2016   Carotid US 10/2018: Bilateral ICA 1-39; right subclavian 1.7 cm (no change since 03/2017) // Carotid US 10/21: Bilat ICA 1-39//Bilateral ICA 10/22: R 1-39; L 1-39   External hemorrhoids without mention of complication    Heart valve replaced by other means    LBBB (left bundle branch block)    Myoview 11/22: EF 54, PVCs, no ischemia or scar; low risk   Long term (current) use of anticoagulants 09/15/2010   Meniere's disease    Other and  unspecified hyperlipidemia    Other constipation    Parkinson disease ([place]) 02/2016   no meds, sees [name]    Personal history of colonic polyps    Personal history of malignant neoplasm of prostate    Right inguinal hernia 11/18/2016   Shingles    SHINGLES 11/26/2009   Thoracic aneurysm without mention of rupture    followed by [name] with yearly CT scans    Unspecified essential hypertension    Current Medications: Current Meds  Medication Sig   ACETAMINOPHEN PO Take 650 mg by mouth every 6 (six) hours as needed for pain.    amoxicillin (AMOXIL) 500 MG capsule Take 4 capsules (2,000 mg) one hour prior to all  dental visits.   atorvastatin (LIPITOR) 40 MG tablet TAKE 1 TABLET BY MOUTH EVERY DAY   carbidopa-levodopa (SINEMET IR) 25-100 MG tablet TAKE 1 TABLET BY MOUTH 3 TIMES DAILY   levothyroxine (SYNTHROID) 75 MCG tablet TAKE 1 TABLET BY MOUTH EVERY DAY   magnesium hydroxide (MILK OF MAGNESIA) 400 MG/5ML suspension Take 30 mLs by mouth daily as needed. Reported on 04/21/2015   Probiotic Product (PROBIOTIC-10 ULTIMATE) CAPS Take 1 capsule by mouth daily.   saline (AYR) GEL Place 1 application into the nose in the morning and at bedtime.    sodium chloride (OCEAN) 0.65 % SOLN nasal spray Place 1 spray into both nostrils as needed for congestion. Reported on 04/21/2015   triamterene-hydrochlorothiazide (MAXZIDE-25) 37.5-25 MG tablet TAKE 1 TABLET BY MOUTH EVERY MORNING   warfarin (COUMADIN) 5 MG tablet TAKE AS DIRECTED BY COUMADIN CLINIC    Allergies:   Codeine   Social History   Tobacco Use   Smoking status: Former    Packs/day: 0.50    Years: 10.00    Pack years: 5.00    Types: Cigarettes    Quit date: 02/06/1972    Years since quitting: 49.0   Smokeless tobacco: Never  Vaping Use   Vaping Use: Never used  Substance Use Topics   Alcohol use: Yes    Alcohol/week: 7.0 standard drinks    Types: 7 Standard drinks or equivalent per week    Comment: glass of wine daily; just about    Drug use: No    Family Hx: The patient's family history includes Breast cancer in his sister; CAD in his father; Diabetes in his brother and brother; Heart attack in his mother; Heart attack (age of onset: 43) in his father; Heart disease in his brother; Hypertension in his brother and father; Other in his father; Prostate cancer in his brother; Stroke in his brother. There is no history of Colon cancer.  ROS see HPI  EKGs/Labs/Other Test Reviewed:    EKG:  EKG is not ordered today.  The ekg ordered today demonstrates n/a  Recent Labs: 05/05/2020: ALT 5; BUN 25; Creatinine, Ser 0.97; Hemoglobin 13.5; Platelets  200.0; Potassium 4.5; Sodium 138; TSH 0.85   Recent Lipid Panel Recent Labs    05/05/20 0856  CHOL 112  TRIG 58.0  HDL 42.40  VLDL 11.6  LDLCALC 58     Risk Assessment/Calculations:         Physical Exam:    VS:  BP 130/64    Pulse 70    Ht 5\' 10"  (1.778 m)    Wt 137 lb 3.2 oz (62.2 kg)    SpO2 97%    BMI 19.69 kg/m     Wt Readings from Last 3 Encounters:  01/29/21 137 lb 3.2  oz (62.2 kg)  01/19/21 137 lb (62.1 kg)  11/30/20 138 lb (62.6 kg)    Constitutional:      Appearance: Healthy appearance. Not in distress.  Neck:     Vascular: JVD normal.  Pulmonary:     Effort: Pulmonary effort is normal.     Breath sounds: No wheezing. No rales.  Cardiovascular:     Normal rate. Regular rhythm. Normal S1. S2. Mechanical      Murmurs: There is a grade 1/6 systolic murmur at the URSB.  Edema:    Peripheral edema absent.  Abdominal:     Palpations: Abdomen is soft.  Skin:    General: Skin is warm and dry.  Neurological:     General: No focal deficit present.     Mental Status: Alert and oriented to person, place and time.     Cranial Nerves: Cranial nerves are intact.        ASSESSMENT & PLAN:   LBBB (left bundle branch block) This was noted at his last visit in October.  His echocardiogram demonstrated normal LV function.  Myoview demonstrated no ischemia or scar.  He is not having anginal symptoms.  Essential hypertension The patient's blood pressure is controlled on his current regimen.  Continue current therapy.   Aortic valve disorder S/p mechanical AVR 1991.  Recent echo with normally functioning AVR.  He remains on warfarin therapy and is managed in our Coumadin clinic.  Continue SBE prophylaxis.  Thoracic aortic aneurysm without rupture ([place]) Stable by recent CT.  He is followed by Dr. Roxan Hockey.  Lung nodule Recent follow-up scan demonstrated enlarging pulmonary nodule.  He also had dilated bile ducts on CT.  He had an abdominal and pelvic CT yesterday.   He is scheduled for a PET scan soon for further evaluation.  His recent Myoview was low risk.  His recent echocardiogram demonstrated normal LV function.  He is able to achieve more than 4 METs.  If the patient needs to undergo surgery in the near future, he should be able to proceed without further testing.          Dispo:  Return in about 6 months (around 07/29/2021) for Routine Follow Up, w/ Richardson Dopp, PA-C.   Medication Adjustments/Labs and Tests Ordered: Current medicines are reviewed at length with the patient today.  Concerns regarding medicines are outlined above.  Tests Ordered: No orders of the defined types were placed in this encounter.  Medication Changes: No orders of the defined types were placed in this encounter.  Signed, Richardson Dopp, PA-C  01/29/2021 11:28 AM    [facility] [place], [place], [place]  [address] Phone: [phone]; Fax: [phone]

## 2021-01-29 ENCOUNTER — Encounter: Payer: Self-pay | Admitting: Physician Assistant

## 2021-01-29 ENCOUNTER — Ambulatory Visit: Payer: Medicare Other | Admitting: Physician Assistant

## 2021-01-29 ENCOUNTER — Other Ambulatory Visit: Payer: Self-pay

## 2021-01-29 VITALS — BP 130/64 | HR 70 | Ht 70.0 in | Wt 137.2 lb

## 2021-01-29 DIAGNOSIS — E785 Hyperlipidemia, unspecified: Secondary | ICD-10-CM

## 2021-01-29 DIAGNOSIS — R911 Solitary pulmonary nodule: Secondary | ICD-10-CM | POA: Diagnosis not present

## 2021-01-29 DIAGNOSIS — I359 Nonrheumatic aortic valve disorder, unspecified: Secondary | ICD-10-CM | POA: Diagnosis not present

## 2021-01-29 DIAGNOSIS — I447 Left bundle-branch block, unspecified: Secondary | ICD-10-CM | POA: Diagnosis not present

## 2021-01-29 DIAGNOSIS — I10 Essential (primary) hypertension: Secondary | ICD-10-CM | POA: Diagnosis not present

## 2021-01-29 DIAGNOSIS — I7121 Aneurysm of the ascending aorta, without rupture: Secondary | ICD-10-CM | POA: Diagnosis not present

## 2021-01-29 DIAGNOSIS — I6523 Occlusion and stenosis of bilateral carotid arteries: Secondary | ICD-10-CM

## 2021-01-29 NOTE — Assessment & Plan Note (Signed)
Stable by recent CT.  He is followed by Dr. Roxan Hockey.

## 2021-01-29 NOTE — Assessment & Plan Note (Signed)
Recent follow-up scan demonstrated enlarging pulmonary nodule.  He also had dilated bile ducts on CT.  He had an abdominal and pelvic CT yesterday.  He is scheduled for a PET scan soon for further evaluation.  His recent Myoview was low risk.  His recent echocardiogram demonstrated normal LV function.  He is able to achieve more than 4 METs.  If the patient needs to undergo surgery in the near future, he should be able to proceed without further testing.

## 2021-01-29 NOTE — Assessment & Plan Note (Signed)
This was noted at his last visit in October.  His echocardiogram demonstrated normal LV function.  Myoview demonstrated no ischemia or scar.  He is not having anginal symptoms.

## 2021-01-29 NOTE — Patient Instructions (Signed)
Medication Instructions:  Your physician recommends that you continue on your current medications as directed. Please refer to the Current Medication list given to you today.  *If you need a refill on your cardiac medications before your next appointment, please call your pharmacy*   Lab Work: None ordered  If you have labs (blood work) drawn today and your tests are completely normal, you will receive your results only by: . MyChart Message (if you have MyChart) OR . A paper copy in the mail If you have any lab test that is abnormal or we need to change your treatment, we will call you to review the results.   Testing/Procedures: None ordered   Follow-Up: At [facility], you and your health needs are our priority.  As part of our continuing mission to provide you with exceptional heart care, we have created designated Provider Care Teams.  These Care Teams include your primary Cardiologist (physician) and Advanced Practice Providers (APPs -  Physician Assistants and Nurse Practitioners) who all work together to provide you with the care you need, when you need it.  We recommend signing up for the patient portal called "MyChart".  Sign up information is provided on this After Visit Summary.  MyChart is used to connect with patients for Virtual Visits (Telemedicine).  Patients are able to view lab/test results, encounter notes, upcoming appointments, etc.  Non-urgent messages can be sent to your provider as well.   To learn more about what you can do with MyChart, go to [email or web address].    Your next appointment:   6 month(s)  The format for your next appointment:   In Person  Provider:   Scott Weaver, PA-C   Other Instructions   

## 2021-01-29 NOTE — Assessment & Plan Note (Signed)
S/p mechanical AVR 1991.  Recent echo with normally functioning AVR.  He remains on warfarin therapy and is managed in our Coumadin clinic.  Continue SBE prophylaxis.

## 2021-01-29 NOTE — Assessment & Plan Note (Signed)
The patient's blood pressure is controlled on his current regimen.  Continue current therapy.  

## 2021-02-02 ENCOUNTER — Ambulatory Visit (HOSPITAL_COMMUNITY): Payer: Medicare Other

## 2021-02-03 ENCOUNTER — Other Ambulatory Visit: Payer: Self-pay

## 2021-02-03 ENCOUNTER — Encounter (HOSPITAL_COMMUNITY)
Admission: RE | Admit: 2021-02-03 | Discharge: 2021-02-03 | Disposition: A | Discharge status: Home / Self Care | Payer: Medicare Other | Source: Ambulatory Visit | Attending: Thoracic Surgery (Cardiothoracic Vascular Surgery) | Admitting: Thoracic Surgery (Cardiothoracic Vascular Surgery)

## 2021-02-03 DIAGNOSIS — R911 Solitary pulmonary nodule: Secondary | ICD-10-CM | POA: Diagnosis not present

## 2021-02-03 DIAGNOSIS — I700 Atherosclerosis of aorta: Secondary | ICD-10-CM | POA: Diagnosis not present

## 2021-02-03 DIAGNOSIS — K5730 Diverticulosis of large intestine without perforation or abscess without bleeding: Secondary | ICD-10-CM | POA: Diagnosis not present

## 2021-02-03 DIAGNOSIS — I739 Peripheral vascular disease, unspecified: Secondary | ICD-10-CM | POA: Diagnosis not present

## 2021-02-03 DIAGNOSIS — K7689 Other specified diseases of liver: Secondary | ICD-10-CM | POA: Diagnosis not present

## 2021-02-03 LAB — GLUCOSE, CAPILLARY: Glucose-Capillary: 105 mg/dL — ABNORMAL HIGH (ref 70–99)

## 2021-02-03 MED ORDER — FLUDEOXYGLUCOSE F - 18 (FDG) INJECTION
6.8000 | Freq: Once | INTRAVENOUS | Status: AC | PRN
  Administered 2021-02-03: 6.8 via INTRAVENOUS

## 2021-02-09 ENCOUNTER — Ambulatory Visit: Payer: Medicare Other | Admitting: Thoracic Surgery (Cardiothoracic Vascular Surgery)

## 2021-02-09 ENCOUNTER — Other Ambulatory Visit: Payer: Self-pay

## 2021-02-09 VITALS — BP 122/70 | HR 85 | Resp 20 | Ht 70.0 in | Wt 137.0 lb

## 2021-02-09 DIAGNOSIS — R911 Solitary pulmonary nodule: Secondary | ICD-10-CM

## 2021-02-09 NOTE — Progress Notes (Signed)
[address]       [place] [address]             [phone]     HPI: David Benton returns to discuss the results of his PET/CT.  David Benton is an 88 year old man with a history of aortic valve replacement (mechanical), ascending aneurysm, Parkinson's, bilateral carotid stenosis, TIA, hypertension, hypothyroidism, and hyperlipidemia.  He has been followed in our office since 2009 for an ascending aneurysm.  He was initially followed by [name].  In September he had a CT which showed stable aneurysm but a new right lower lobe lung nodule.  A 75-month follow-up scan the lung nodule increased slightly but he was noted to have dilated bile ducts.  I made a referral for gastroenterology, but he never heard from them.  He tried calling their office himself but has not been able to arrange an appointment.  He had a PET/CT which showed no significant activity in the lung nodule but there was hypermetabolic liver mass concerning for cholangiocarcinoma.  Past Medical History:  Diagnosis Date   Bicuspid AS s/p mechanical AVR in 1991    Echocardiogram 10/22: EF 55-60, no RWMA, mod LVH, Gr 1 DD, normal RVSF, mild LAE, mild MR, AVR functioning normally with trivial AI, aortic root 43 mm, ascending aorta 45 mm, RVSP 40.7 mmHg   Bilateral carotid artery stenosis 07/10/2016   Carotid US 10/2018: Bilateral ICA 1-39; right subclavian 1.7 cm (no change since 03/2017) // Carotid US 10/21: Bilat ICA 1-39//Bilateral ICA 10/22: R 1-39; L 1-39   External hemorrhoids without mention of complication    Heart valve replaced by other means    LBBB (left bundle branch block)    Myoview 11/22: EF 54, PVCs, no ischemia or scar; low risk   Long term (current) use of anticoagulants 09/15/2010   Meniere's disease    Other and unspecified hyperlipidemia    Other constipation    Parkinson disease ([name]) 02/2016   no meds, sees [name]    Personal history of colonic polyps    Personal history  of malignant neoplasm of prostate    Right inguinal hernia 11/18/2016   Shingles    SHINGLES 11/26/2009   Thoracic aneurysm without mention of rupture    followed by [name] with yearly CT scans    Unspecified essential hypertension     Current Outpatient Medications  Medication Sig Dispense Refill   ACETAMINOPHEN PO Take 650 mg by mouth every 6 (six) hours as needed for pain.      amoxicillin (AMOXIL) 500 MG capsule Take 4 capsules (2,000 mg) one hour prior to all dental visits. 12 capsule 6   atorvastatin (LIPITOR) 40 MG tablet TAKE 1 TABLET BY MOUTH EVERY DAY 30 tablet 11   carbidopa-levodopa (SINEMET IR) 25-100 MG tablet TAKE 1 TABLET BY MOUTH 3 TIMES DAILY 90 tablet 0   levothyroxine (SYNTHROID) 75 MCG tablet TAKE 1 TABLET BY MOUTH EVERY DAY 90 tablet 0   magnesium hydroxide (MILK OF MAGNESIA) 400 MG/5ML suspension Take 30 mLs by mouth daily as needed. Reported on 04/21/2015     Probiotic Product (PROBIOTIC-10 ULTIMATE) CAPS Take 1 capsule by mouth daily.     saline (AYR) GEL Place 1 application into the nose in the morning and at bedtime.      sodium chloride (OCEAN) 0.65 % SOLN nasal spray Place 1 spray into both nostrils as needed for congestion. Reported on 04/21/2015  triamterene-hydrochlorothiazide (MAXZIDE-25) 37.5-25 MG tablet TAKE 1 TABLET BY MOUTH EVERY MORNING 90 tablet 0   warfarin (COUMADIN) 5 MG tablet TAKE AS DIRECTED BY COUMADIN CLINIC 50 tablet 3   No current facility-administered medications for this visit.    Physical Exam  Diagnostic Tests: NUCLEAR MEDICINE PET SKULL BASE TO THIGH   TECHNIQUE: 6.8 mCi F-18 FDG was injected intravenously. Full-ring PET imaging was performed from the skull base to thigh after the radiotracer. CT data was obtained and used for attenuation correction and anatomic localization.   Fasting blood glucose: 105 mg/dl   COMPARISON:  CT chest 01/01/2021 and CT abdomen 01/28/2021   FINDINGS: Mediastinal blood pool activity:  SUV max 2.43   Liver activity: SUV max NA   NECK: Choose 1   Incidental CT findings: none   CHEST: The wedge-shaped subpleural density in the medial right lower lobe is weakly hypermetabolic with SUV max of 5.36. This is un likely a primary lung neoplasm and more likely rounded atelectasis. No other worrisome pulmonary lesions are identified. A few tiny subpleural pulmonary nodules are stable. No enlarged or hypermetabolic mediastinal or hilar lymph nodes. No supraclavicular or axillary adenopathy.   Incidental CT findings: Stable advanced vascular disease.   ABDOMEN/PELVIS: Large ill-defined infiltrating mass in the central liver is hypermetabolic with SUV max of 6.44. Persistent biliary dilatation. Findings consistent with cholangiocarcinoma. Second smaller lesion in the left hepatic lobe has an SUV max of 3.60 and is likely a metastatic focus. There is also metastatic celiac axis lymphadenopathy with SUV max of 3.60.   No retroperitoneal lymphadenopathy.   Incidental CT findings: Advanced atherosclerotic calcifications involving the aorta, iliac arteries and branch vessels but no focal aneurysm. Advanced colonic diverticulosis. Status post prostatectomy.   SKELETON: No findings to suggest osseous metastatic disease.   Incidental CT findings: none   IMPRESSION: 1. Large ill-defined infiltrating central liver mass is hypermetabolic and consistent with cholangiocarcinoma. Hypermetabolic metastatic focus in the left hepatic lobe. Celiac axis adenopathy. 2. The peripheral wedge-shaped right lower lobe pulmonary lesion is weakly hypermetabolic and more likely rounded atelectasis and less likely neoplastic. Recommend continued surveillance.     Electronically Signed   By: Marijo Sanes M.D.   On: 02/03/2021 16:41 I personally reviewed the PET/CT images.  Extensive hypermetabolic activity in the liver.  Impression: David Benton is an 88 year old man with a history of  aortic valve replacement (mechanical), ascending aneurysm, Parkinson's, bilateral carotid stenosis, TIA, hypertension, hypothyroidism, and hyperlipidemia.   He was found to have a lung nodule on his CT about 4 months ago.  A follow-up CT showed a slight increase in size.  He was also noted to have dilated bile ducts.  PET/CT shows a large hypermetabolic mass in the liver concerning for cholangiocarcinoma.  Plan: Referral to general surgery and/or gastroenterology for further work-up of liver mass Plan to see him back in about 6 months regarding the lung nodule.  Melrose Nakayama, MD [facility] [phone]

## 2021-02-10 ENCOUNTER — Ambulatory Visit: Payer: Medicare Other

## 2021-02-10 DIAGNOSIS — R1013 Epigastric pain: Secondary | ICD-10-CM | POA: Diagnosis not present

## 2021-02-10 DIAGNOSIS — K838 Other specified diseases of biliary tract: Secondary | ICD-10-CM | POA: Diagnosis not present

## 2021-02-10 DIAGNOSIS — Z952 Presence of prosthetic heart valve: Secondary | ICD-10-CM | POA: Diagnosis not present

## 2021-02-10 DIAGNOSIS — Z5181 Encounter for therapeutic drug level monitoring: Secondary | ICD-10-CM | POA: Diagnosis not present

## 2021-02-10 DIAGNOSIS — I359 Nonrheumatic aortic valve disorder, unspecified: Secondary | ICD-10-CM

## 2021-02-10 DIAGNOSIS — R160 Hepatomegaly, not elsewhere classified: Secondary | ICD-10-CM | POA: Diagnosis not present

## 2021-02-10 LAB — POCT INR: INR: 3.8 — AB (ref 2.0–3.0)

## 2021-02-10 NOTE — Progress Notes (Signed)
I spoke with Mrs Majid and she accepted the appt with Dr Burr Medico on 1/30 at 1500.  I gave her our address, information regarding valet parking.  I requested they arrive at 1445 for registration purposes.  I provided my phone number.  All questions were answered.  She verbalized understanding.

## 2021-02-10 NOTE — Patient Instructions (Signed)
Description   Skip today's dosage of Warfarin, then start taking same dosage of Warfarin 1.5 tablets daily except 1 tablet on Mondays. Recheck INR in 2 weeks. Call [facility] # [phone] with any concerns

## 2021-02-11 ENCOUNTER — Other Ambulatory Visit: Payer: Self-pay | Admitting: Cardiovascular Disease

## 2021-02-12 ENCOUNTER — Encounter (HOSPITAL_COMMUNITY): Payer: Self-pay

## 2021-02-12 DIAGNOSIS — C801 Malignant (primary) neoplasm, unspecified: Secondary | ICD-10-CM | POA: Diagnosis present

## 2021-02-12 DIAGNOSIS — K831 Obstruction of bile duct: Secondary | ICD-10-CM | POA: Diagnosis present

## 2021-02-12 DIAGNOSIS — R160 Hepatomegaly, not elsewhere classified: Secondary | ICD-10-CM | POA: Diagnosis present

## 2021-02-12 NOTE — H&P (Signed)
David Benton [birth year]  [id number].    Chief Complaint/Reason for Consult: liver mass  HPI:  David Benton is a 88 y.o. male who was referred for evaluation of a liver mass. He is followed by CT surgery for a lung nodule and an ascending aortic aneurysm, and on surveillance imaging of the chest was noted to have dilated intrahepatic bile ducts. This was first noted on a non-contrast CT chest on 01/01/21. He then had a triple-phase CT abd/pelvis on 01/28/21, which showed a large mass in the central liver, and an additional mass in the left lateral liver He has been referred for further evaluation.   He recently underwent a PET/CT for workup of the lung nodule, which based on low uptake was felt to be non-neoplastic. There was hypermetabolic activity of both liver masses, and celiac adenopathy was noted as well.   He was seen in clinic last week, at which time he reported abdominal pain for the last 2 weeks. Denies jaundice, fevers, or chills.  Labs were sent that day and were significant for a bilirubin of 2 and an alk phos of 900. He has been direct admitted for ERCP with biopsy and stent placement.  ROS: Review of Systems  Constitutional:  Negative for chills and fever.  Respiratory:  Negative for shortness of breath and wheezing.   Cardiovascular:  Negative for chest pain.  Gastrointestinal:  Positive for vomiting. Negative for nausea.  Neurological:  Negative for loss of consciousness and weakness.   Family History  Problem Relation Age of Onset   Heart attack Father 56       fatal at 54   Hypertension Father    CAD Father    Other Father        possible PD as says had "palsy"   Prostate cancer Brother    Diabetes Brother    Heart attack Mother    Diabetes Brother    Stroke Brother    Heart disease Brother        carotid artery disease   Hypertension Brother    Breast cancer Sister        mastectomy at 60   Colon cancer Neg Hx     Past Medical History:  Diagnosis  Date   Bicuspid AS s/p mechanical AVR in 1991    Echocardiogram 10/22: EF 55-60, no RWMA, mod LVH, Gr 1 DD, normal RVSF, mild LAE, mild MR, AVR functioning normally with trivial AI, aortic root 43 mm, ascending aorta 45 mm, RVSP 40.7 mmHg   Bilateral carotid artery stenosis 07/10/2016   Carotid US 10/2018: Bilateral ICA 1-39; right subclavian 1.7 cm (no change since 03/2017) // Carotid US 10/21: Bilat ICA 1-39//Bilateral ICA 10/22: R 1-39; L 1-39   External hemorrhoids without mention of complication    Heart valve replaced by other means    LBBB (left bundle branch block)    Myoview 11/22: EF 54, PVCs, no ischemia or scar; low risk   Long term (current) use of anticoagulants 09/15/2010   Meniere's disease    Other and unspecified hyperlipidemia    Other constipation    Parkinson disease ([place]) 02/2016   no meds, sees [name]    Personal history of colonic polyps    Personal history of malignant neoplasm of prostate    Right inguinal hernia 11/18/2016   Shingles    SHINGLES 11/26/2009   Thoracic aneurysm without mention of rupture    followed by [name] with yearly CT scans  Unspecified essential hypertension     Past Surgical History:  Procedure Laterality Date   AORTIC VALVE REPLACEMENT  1991    st. jude's valve, per [name]    CATARACT EXTRACTION Left 12/2018   COLONOSCOPY  06-28-10   per Dr. Henrene Pastor, benign polyps, no further scopes needed    [place] Right 11/18/2016   Procedure: OPEN REPAIR RIGHT INGUINAL HERNIA WITH MESH;  Surgeon: Fanny Skates, MD;  Location: [facility];  Service: General;  Laterality: Right;   INSERTION OF MESH Right 11/18/2016   Procedure: INSERTION OF MESH;  Surgeon: Fanny Skates, MD;  Location: [facility];  Service: General;  Laterality: Right;   PROSTATECTOMY  1995   per Dr. Roni Bread    TONSILLECTOMY      Social History:  reports that he quit smoking about 49 years ago.  His smoking use included cigarettes. He has a 5.00 pack-year smoking history. He has never used smokeless tobacco. He reports current alcohol use of about 7.0 standard drinks per week. He reports that he does not use drugs.  Allergies:  Allergies  Allergen Reactions   Codeine Nausea And Vomiting    Medications Prior to Admission  Medication Sig Dispense Refill   ACETAMINOPHEN PO Take 650 mg by mouth every 6 (six) hours as needed for pain.      amoxicillin (AMOXIL) 500 MG capsule Take 4 capsules (2,000 mg) one hour prior to all dental visits. 12 capsule 6   atorvastatin (LIPITOR) 40 MG tablet Take 40 tablets by mouth daily.     carbidopa-levodopa (SINEMET IR) 25-100 MG tablet TAKE 1 TABLET BY MOUTH 3 TIMES DAILY (Patient taking differently: Take 1 tablet by mouth 3 (three) times daily. 7AM/11AM/4PM) 90 tablet 0   levothyroxine (SYNTHROID) 75 MCG tablet Take 75 mcg by mouth daily.     magnesium hydroxide (MILK OF MAGNESIA) 400 MG/5ML suspension Take 30 mLs by mouth daily. Reported on 04/21/2015     Probiotic Product (PROBIOTIC-10 ULTIMATE) CAPS Take 1 capsule by mouth daily.     saline (AYR) GEL Place 1 application into the nose in the morning and at bedtime.      sodium chloride (OCEAN) 0.65 % SOLN nasal spray Place 1 spray into both nostrils at bedtime as needed for congestion. Reported on 04/21/2015     triamterene-hydrochlorothiazide (MAXZIDE-25) 37.5-25 MG tablet TAKE 1 TABLET BY MOUTH EVERY MORNING (Patient taking differently: Take 1 tablet by mouth daily.) 90 tablet 0   warfarin (COUMADIN) 5 MG tablet TAKE AS DIRECTED BY COUMADIN CLINIC (Patient taking differently: Take 5-7.5 mg by mouth See admin instructions. Take 1 tablet on Mondays ONLY and 1 and half tablet on other days.) 50 tablet 3   atorvastatin (LIPITOR) 40 MG tablet TAKE 1 TABLET BY MOUTH EVERY DAY (Patient not taking: Reported on 02/14/2021) 30 tablet 11   levothyroxine (SYNTHROID) 75 MCG tablet TAKE 1 TABLET BY MOUTH EVERY DAY  (Patient not taking: Reported on 02/14/2021) 90 tablet 0     Physical Exam: Blood pressure 129/72, pulse 68, temperature 97.9 F (36.6 C), temperature source Oral, resp. rate 18, height '5\' 10"'  (1.778 m), weight 62.1 kg, SpO2 100 %. General: resting comfortably, appears stated age, no apparent distress Neurological: alert and oriented, no focal deficits, cranial nerves grossly in tact HEENT: normocephalic, atraumatic, oropharynx clear, no scleral icterus CV: extremities warm and well-perfused Respiratory: normal work of breathing on room air, symmetric chest wall expansion Abdomen:  soft, nondistended, nontender to deep palpation. No masses or organomegaly. Extremities: warm and well-perfused, no deformities, moving all extremities spontaneously Psychiatric: normal mood and affect Skin: warm and dry, no jaundice, no rashes or lesions   Results for orders placed or performed during the hospital encounter of 02/14/21 (from the past 48 hour(s))  Comprehensive metabolic panel     Status: Abnormal   Collection Time: 02/14/21  2:32 PM  Result Value Ref Range   Sodium 137 135 - 145 mmol/L   Potassium 3.6 3.5 - 5.1 mmol/L   Chloride 97 (L) 98 - 111 mmol/L   CO2 32 22 - 32 mmol/L   Glucose, Bld 160 (H) 70 - 99 mg/dL    Comment: Glucose reference range applies only to samples taken after fasting for at least 8 hours.   BUN 23 8 - 23 mg/dL   Creatinine, Ser 1.33 (H) 0.61 - 1.24 mg/dL   Calcium 9.5 8.9 - 10.3 mg/dL   Total Protein 7.3 6.5 - 8.1 g/dL   Albumin 3.7 3.5 - 5.0 g/dL   AST 97 (H) 15 - 41 U/L   ALT 32 0 - 44 U/L   Alkaline Phosphatase 649 (H) 38 - 126 U/L   Total Bilirubin 2.8 (H) 0.3 - 1.2 mg/dL   GFR, Estimated 51 (L) >60 mL/min    Comment: (NOTE) Calculated using the CKD-EPI Creatinine Equation (2021)    Anion gap 8 5 - 15    Comment: Performed at [facility] [address], [place], [place] [address]  Protime-INR     Status: Abnormal   Collection Time: 02/14/21   2:32 PM  Result Value Ref Range   Prothrombin Time 25.4 (H) 11.4 - 15.2 seconds   INR 2.3 (H) 0.8 - 1.2    Comment: (NOTE) INR goal varies based on device and disease states. Performed at [facility], [place] [address] [place] [address]   CBC     Status: None   Collection Time: 02/14/21  2:32 PM  Result Value Ref Range   WBC 4.7 4.0 - 10.5 K/uL   RBC 4.42 4.22 - 5.81 MIL/uL   Hemoglobin 13.2 13.0 - 17.0 g/dL   HCT 39.4 39.0 - 52.0 %   MCV 89.1 80.0 - 100.0 fL   MCH 29.9 26.0 - 34.0 pg   MCHC 33.5 30.0 - 36.0 g/dL   RDW 13.7 11.5 - 15.5 %   Platelets 260 150 - 400 K/uL   nRBC 0.0 0.0 - 0.2 %    Comment: Performed at [facility], [place] [address] [place], [place] [address]  CBC     Status: Abnormal   Collection Time: 02/15/21  5:05 AM  Result Value Ref Range   WBC 4.1 4.0 - 10.5 K/uL   RBC 4.15 (L) 4.22 - 5.81 MIL/uL   Hemoglobin 12.6 (L) 13.0 - 17.0 g/dL   HCT 37.1 (L) 39.0 - 52.0 %   MCV 89.4 80.0 - 100.0 fL   MCH 30.4 26.0 - 34.0 pg   MCHC 34.0 30.0 - 36.0 g/dL   RDW 13.7 11.5 - 15.5 %   Platelets 212 150 - 400 K/uL   nRBC 0.0 0.0 - 0.2 %    Comment: Performed at [facility], [place] [address] [place], [place] [address]  Comprehensive metabolic panel Once     Status: Abnormal   Collection Time: 02/15/21  5:05 AM  Result Value Ref Range   Sodium 136  135 - 145 mmol/L   Potassium 3.5 3.5 - 5.1 mmol/L   Chloride 101 98 - 111 mmol/L   CO2 28 22 - 32 mmol/L   Glucose, Bld 107 (H) 70 - 99 mg/dL    Comment: Glucose reference range applies only to samples taken after fasting for at least 8 hours.   BUN 19 8 - 23 mg/dL   Creatinine, Ser 1.05 0.61 - 1.24 mg/dL   Calcium 8.9 8.9 - 10.3 mg/dL   Total Protein 6.4 (L) 6.5 - 8.1 g/dL   Albumin 3.2 (L) 3.5 - 5.0 g/dL   AST 86 (H) 15 - 41 U/L   ALT 45 (H) 0 - 44 U/L   Alkaline Phosphatase 564 (H) 38 - 126 U/L   Total Bilirubin 2.4 (H) 0.3 - 1.2 mg/dL   GFR, Estimated >60 >60 mL/min    Comment:  (NOTE) Calculated using the CKD-EPI Creatinine Equation (2021)    Anion gap 7 5 - 15    Comment: Performed at [facility], [place] [address], [place], [place] [address]  Protime-INR     Status: Abnormal   Collection Time: 02/15/21  5:05 AM  Result Value Ref Range   Prothrombin Time 26.8 (H) 11.4 - 15.2 seconds   INR 2.5 (H) 0.8 - 1.2    Comment: (NOTE) INR goal varies based on device and disease states. Performed at [facility], [place] [address], [place],  [address]    No results found.    Assessment/Plan 88 yo male with biliary obstruction secondary to a large multifocal liver mass, likely a cholangiocarcinoma. - GI consulted for ERCP with stent placement and biopsies. Discussed with Dr. Rush Landmark, will need to wait until INR<2.0. - Discussed with patient's primary cardiologist Dr. Burt Knack, ok to hold Coumadin for ERCP. Trend INR daily, INR today is 2.5. - Regular diet - If fevers or leukocytosis, begin Zosyn. WBC currently normal at 4. - VTE: SCDs, INR therapeutic - Dispo: admit to inpatient   Michaelle Birks, [facility], Hepatobiliary and Pancreatic Surgery 02/15/21 8:15 AM

## 2021-02-14 ENCOUNTER — Encounter (HOSPITAL_COMMUNITY): Payer: Self-pay | Admitting: Surgery

## 2021-02-14 ENCOUNTER — Inpatient Hospital Stay (HOSPITAL_COMMUNITY)
Admission: AD | Admit: 2021-02-14 | Discharge: 2021-02-19 | DRG: 435 | Disposition: A | Discharge status: Home / Self Care | Payer: Medicare Other | Source: Ambulatory Visit | Attending: Surgery | Admitting: Surgery

## 2021-02-14 ENCOUNTER — Other Ambulatory Visit: Payer: Self-pay

## 2021-02-14 DIAGNOSIS — I7121 Aneurysm of the ascending aorta, without rupture: Secondary | ICD-10-CM | POA: Diagnosis not present

## 2021-02-14 DIAGNOSIS — Z7901 Long term (current) use of anticoagulants: Secondary | ICD-10-CM | POA: Diagnosis not present

## 2021-02-14 DIAGNOSIS — Z803 Family history of malignant neoplasm of breast: Secondary | ICD-10-CM | POA: Diagnosis not present

## 2021-02-14 DIAGNOSIS — C801 Malignant (primary) neoplasm, unspecified: Secondary | ICD-10-CM | POA: Diagnosis not present

## 2021-02-14 DIAGNOSIS — D490 Neoplasm of unspecified behavior of digestive system: Secondary | ICD-10-CM | POA: Diagnosis not present

## 2021-02-14 DIAGNOSIS — I10 Essential (primary) hypertension: Secondary | ICD-10-CM | POA: Diagnosis present

## 2021-02-14 DIAGNOSIS — K222 Esophageal obstruction: Secondary | ICD-10-CM | POA: Diagnosis present

## 2021-02-14 DIAGNOSIS — Z8042 Family history of malignant neoplasm of prostate: Secondary | ICD-10-CM | POA: Diagnosis not present

## 2021-02-14 DIAGNOSIS — E785 Hyperlipidemia, unspecified: Secondary | ICD-10-CM | POA: Diagnosis not present

## 2021-02-14 DIAGNOSIS — Z833 Family history of diabetes mellitus: Secondary | ICD-10-CM

## 2021-02-14 DIAGNOSIS — K831 Obstruction of bile duct: Secondary | ICD-10-CM | POA: Diagnosis not present

## 2021-02-14 DIAGNOSIS — K209 Esophagitis, unspecified without bleeding: Secondary | ICD-10-CM | POA: Diagnosis not present

## 2021-02-14 DIAGNOSIS — Z8052 Family history of malignant neoplasm of bladder: Secondary | ICD-10-CM | POA: Diagnosis not present

## 2021-02-14 DIAGNOSIS — J850 Gangrene and necrosis of lung: Secondary | ICD-10-CM | POA: Diagnosis not present

## 2021-02-14 DIAGNOSIS — Z801 Family history of malignant neoplasm of trachea, bronchus and lung: Secondary | ICD-10-CM | POA: Diagnosis not present

## 2021-02-14 DIAGNOSIS — K8051 Calculus of bile duct without cholangitis or cholecystitis with obstruction: Secondary | ICD-10-CM | POA: Diagnosis not present

## 2021-02-14 DIAGNOSIS — C221 Intrahepatic bile duct carcinoma: Principal | ICD-10-CM | POA: Diagnosis present

## 2021-02-14 DIAGNOSIS — Z823 Family history of stroke: Secondary | ICD-10-CM

## 2021-02-14 DIAGNOSIS — Z8546 Personal history of malignant neoplasm of prostate: Secondary | ICD-10-CM | POA: Diagnosis not present

## 2021-02-14 DIAGNOSIS — Z87891 Personal history of nicotine dependence: Secondary | ICD-10-CM | POA: Diagnosis not present

## 2021-02-14 DIAGNOSIS — K7689 Other specified diseases of liver: Secondary | ICD-10-CM | POA: Diagnosis not present

## 2021-02-14 DIAGNOSIS — R160 Hepatomegaly, not elsewhere classified: Secondary | ICD-10-CM | POA: Diagnosis not present

## 2021-02-14 DIAGNOSIS — J9811 Atelectasis: Secondary | ICD-10-CM | POA: Diagnosis present

## 2021-02-14 DIAGNOSIS — I050 Rheumatic mitral stenosis: Secondary | ICD-10-CM | POA: Diagnosis not present

## 2021-02-14 DIAGNOSIS — Z20822 Contact with and (suspected) exposure to covid-19: Secondary | ICD-10-CM | POA: Diagnosis present

## 2021-02-14 DIAGNOSIS — Z952 Presence of prosthetic heart valve: Secondary | ICD-10-CM | POA: Diagnosis not present

## 2021-02-14 DIAGNOSIS — Z8249 Family history of ischemic heart disease and other diseases of the circulatory system: Secondary | ICD-10-CM

## 2021-02-14 DIAGNOSIS — I4891 Unspecified atrial fibrillation: Secondary | ICD-10-CM | POA: Diagnosis not present

## 2021-02-14 DIAGNOSIS — Z885 Allergy status to narcotic agent status: Secondary | ICD-10-CM | POA: Diagnosis not present

## 2021-02-14 DIAGNOSIS — Z5181 Encounter for therapeutic drug level monitoring: Secondary | ICD-10-CM | POA: Diagnosis not present

## 2021-02-14 DIAGNOSIS — I110 Hypertensive heart disease with heart failure: Secondary | ICD-10-CM | POA: Diagnosis not present

## 2021-02-14 DIAGNOSIS — G20 Parkinson's disease: Secondary | ICD-10-CM | POA: Diagnosis present

## 2021-02-14 DIAGNOSIS — C229 Malignant neoplasm of liver, not specified as primary or secondary: Secondary | ICD-10-CM | POA: Diagnosis not present

## 2021-02-14 DIAGNOSIS — Z9889 Other specified postprocedural states: Secondary | ICD-10-CM | POA: Diagnosis not present

## 2021-02-14 DIAGNOSIS — I509 Heart failure, unspecified: Secondary | ICD-10-CM | POA: Diagnosis not present

## 2021-02-14 DIAGNOSIS — K8050 Calculus of bile duct without cholangitis or cholecystitis without obstruction: Secondary | ICD-10-CM | POA: Diagnosis not present

## 2021-02-14 LAB — COMPREHENSIVE METABOLIC PANEL
ALT: 32 U/L (ref 0–44)
AST: 97 U/L — ABNORMAL HIGH (ref 15–41)
Albumin: 3.7 g/dL (ref 3.5–5.0)
Alkaline Phosphatase: 649 U/L — ABNORMAL HIGH (ref 38–126)
Anion gap: 8 (ref 5–15)
BUN: 23 mg/dL (ref 8–23)
CO2: 32 mmol/L (ref 22–32)
Calcium: 9.5 mg/dL (ref 8.9–10.3)
Chloride: 97 mmol/L — ABNORMAL LOW (ref 98–111)
Creatinine, Ser: 1.33 mg/dL — ABNORMAL HIGH (ref 0.61–1.24)
GFR, Estimated: 51 mL/min — ABNORMAL LOW (ref >60)
Glucose, Bld: 160 mg/dL — ABNORMAL HIGH (ref 70–99)
Potassium: 3.6 mmol/L (ref 3.5–5.1)
Sodium: 137 mmol/L (ref 135–145)
Total Bilirubin: 2.8 mg/dL — ABNORMAL HIGH (ref 0.3–1.2)
Total Protein: 7.3 g/dL (ref 6.5–8.1)

## 2021-02-14 LAB — PROTIME-INR
INR: 2.3 — ABNORMAL HIGH (ref 0.8–1.2)
Prothrombin Time: 25.4 seconds — ABNORMAL HIGH (ref 11.4–15.2)

## 2021-02-14 LAB — CBC
HCT: 39.4 % (ref 39.0–52.0)
Hemoglobin: 13.2 g/dL (ref 13.0–17.0)
MCH: 29.9 pg (ref 26.0–34.0)
MCHC: 33.5 g/dL (ref 30.0–36.0)
MCV: 89.1 fL (ref 80.0–100.0)
Platelets: 260 10*3/uL (ref 150–400)
RBC: 4.42 MIL/uL (ref 4.22–5.81)
RDW: 13.7 % (ref 11.5–15.5)
WBC: 4.7 10*3/uL (ref 4.0–10.5)
nRBC: 0 % (ref 0.0–0.2)

## 2021-02-14 MED ORDER — ONDANSETRON HCL 4 MG/2ML IJ SOLN: 4.0000 mg | Freq: Four times a day (QID) | INTRAMUSCULAR | Status: DC | PRN

## 2021-02-14 MED ORDER — ONDANSETRON 4 MG PO TBDP: 4.0000 mg | ORAL_TABLET | Freq: Four times a day (QID) | ORAL | Status: DC | PRN

## 2021-02-14 NOTE — Progress Notes (Incomplete)
[place]   [phone] Fax:([phone]) [place] Note   Patient Care Team: [name], MD as PCP - General (Family Medicine) Sherren Mocha, MD as PCP - Cardiology (Cardiology) Grace Isaac, MD (Inactive) as Consulting Physician (Cardiothoracic Surgery) Sharyne Peach, MD as Consulting Physician (Ophthalmology) Druscilla Brownie, MD as Consulting Physician (Dermatology) Irine Seal, MD as Consulting Physician (Urology) Larey Dresser, MD as Consulting Physician (Cardiology) Viona Gilmore, [facility] as Pharmacist (Pharmacist) Tat, Eustace Quail, DO as Consulting Physician (Neurology) Sharmon Revere as Physician Assistant (Cardiology)  Date of Service:  02/14/2021   CHIEF COMPLAINTS/PURPOSE OF CONSULTATION:  ***  REFERRING PHYSICIAN:  ***  ASSESSMENT & PLAN:  David Benton is a 88 y.o. *** male with a history of ***  1. {Diagnosis from problem list} ***      PLAN:  ***  Oncology History  Biliary obstruction due to malignant neoplasm ([place])  01/01/2021 Imaging   EXAM: CT CHEST WITHOUT CONTRAST  IMPRESSION: 1. The right lower lobe pulmonary nodule has enlarged, suspicious for primary bronchogenic carcinoma. 2. Abnormal appearance of the liver/biliary tree, incompletely and suboptimally evaluated. Intrahepatic and possible extrahepatic biliary duct dilatation, with central hepatic hypoattenuation. Findings are suspicious for either a cholangiocarcinoma or distal common duct obstruction. Recommend dedicated pre and post contrast abdominal CT or MRI. Given patient age, CT would likely be more practical. 3. No thoracic adenopathy. 4. Similar ascending aortic aneurysm, 4.5 cm. Ascending thoracic aortic aneurysm. Recommend semi-annual imaging followup by CTA or MRA and referral to cardiothoracic surgery if not already obtained. This recommendation follows 2010 ACCF/AHA/AATS/ACR/ASA/SCA/SCAI/SIR/STS/SVM Guidelines for the  Diagnosis and Management of Patients With Thoracic Aortic Disease. Circulation. 2010; 121: H741-U384. Aortic aneurysm NOS (ICD10-I71.9) 5. Aortic atherosclerosis (ICD10-I70.0), coronary artery atherosclerosis and emphysema (ICD10-J43.9).   01/28/2021 Imaging   EXAM: CT ABDOMEN WITHOUT AND WITH CONTRAST  IMPRESSION: 1. Ill-defined 4.9 cm hypodense mass which demonstrates delayed enhancement extending from the region of the gallbladder fossa and obstructs the biliary tree with intrahepatic biliary ductal dilation. Additionally, the inferior aspect of the mass abuts and invades the colonic wall at the hepatic flexure with obliteration of the gallbladder fossa. Findings are most consistent with cholangiocarcinoma. 2. Smaller hypodense segment III hepatic lesion which also demonstrates delayed enhancement, concerning for metastasis. 3. Pleural-based 14 mm right lower lobe pulmonary nodule is similar in size to most recent prior, possibly reflecting a metastatic lesion or primary bronchogenic tumor. 4. No pathologically enlarged abdominal lymph nodes. 5.  Aortic Atherosclerosis (ICD10-I70.0).   02/03/2021 PET scan   IMPRESSION: 1. Large ill-defined infiltrating central liver mass is hypermetabolic and consistent with cholangiocarcinoma. Hypermetabolic metastatic focus in the left hepatic lobe. Celiac axis adenopathy. 2. The peripheral wedge-shaped right lower lobe pulmonary lesion is weakly hypermetabolic and more likely rounded atelectasis and less likely neoplastic. Recommend continued surveillance.   02/12/2021 Initial Diagnosis   Biliary obstruction due to malignant neoplasm ([place])      HISTORY OF PRESENTING ILLNESS: *** David Benton 88 y.o. male is a here because of a liver mass. The patient was referred by Dr. Roxan Hockey. The patient presents to the clinic today accompanied by ***.   He has a history of ascending thoracic aortic aneurysm, followed previously by [name] and now by  Dr. Roxan Hockey. On routine surveillance with angio chest CT on 10/05/20, he was found to have an 11 mm nodular pleural-based density in RLL. He returned for short-term follow up chest CT  on 01/01/21 showing: enlargement of RLL pulmonary nodule; abnormal appearance of liver/biliary tree, incompletely and suboptimally evaluated.  This prompted CT abdomen on 01/28/21 showing: ill-defined 4.9 cm hypodense mass which demonstrates delayed enhancement extending from region of gallbladder fossa and obstructs biliary tree with intrahepatic biliary duct dilation, inferior aspect of mass abuts and invades colonic wall at hepatic flexure with obliteration of gallbladder fossa; smaller hypodense segment III hepatic lesion also demonstrates delayed enhancement; similar 14 mm pleural-based RLL pulmonary nodule; no pathologically-enlarged abdominal lymph nodes.   Today the patient notes they felt/feeling prior/after...   {She/He} has a PMHx of....    Socially...    REVIEW OF SYSTEMS:   *** Constitutional: Denies fevers, chills or abnormal night sweats Eyes: Denies blurriness of vision, double vision or watery eyes Ears, nose, mouth, throat, and face: Denies mucositis or sore throat Respiratory: Denies cough, dyspnea or wheezes Cardiovascular: Denies palpitation, chest discomfort or lower extremity swelling Gastrointestinal:  Denies nausea, heartburn or change in bowel habits Skin: Denies abnormal skin rashes Lymphatics: Denies new lymphadenopathy or easy bruising Neurological:Denies numbness, tingling or new weaknesses Behavioral/Psych: Mood is stable, no new changes  All other systems were reviewed with the patient and are negative.   MEDICAL HISTORY:  Past Medical History:  Diagnosis Date   Bicuspid AS s/p mechanical AVR in 1991    Echocardiogram 10/22: EF 55-60, no RWMA, mod LVH, Gr 1 DD, normal RVSF, mild LAE, mild MR, AVR functioning normally with trivial AI, aortic root 43 mm, ascending aorta  45 mm, RVSP 40.7 mmHg   Bilateral carotid artery stenosis 07/10/2016   Carotid US 10/2018: Bilateral ICA 1-39; right subclavian 1.7 cm (no change since 03/2017) // Carotid US 10/21: Bilat ICA 1-39//Bilateral ICA 10/22: R 1-39; L 1-39   External hemorrhoids without mention of complication    Heart valve replaced by other means    LBBB (left bundle branch block)    Myoview 11/22: EF 54, PVCs, no ischemia or scar; low risk   Long term (current) use of anticoagulants 09/15/2010   Meniere's disease    Other and unspecified hyperlipidemia    Other constipation    Parkinson disease ([place]) 02/2016   no meds, sees [name]    Personal history of colonic polyps    Personal history of malignant neoplasm of prostate    Right inguinal hernia 11/18/2016   Shingles    SHINGLES 11/26/2009   Thoracic aneurysm without mention of rupture    followed by [name] with yearly CT scans    Unspecified essential hypertension     SURGICAL HISTORY: Past Surgical History:  Procedure Laterality Date   AORTIC VALVE REPLACEMENT  1991    st. jude's valve, per [name]    CATARACT EXTRACTION Left 12/2018   COLONOSCOPY  06-28-10   per Dr. Henrene Pastor, benign polyps, no further scopes needed    [place] Right 11/18/2016   Procedure: OPEN REPAIR RIGHT INGUINAL HERNIA WITH MESH;  Surgeon: Fanny Skates, MD;  Location: [facility];  Service: General;  Laterality: Right;   INSERTION OF MESH Right 11/18/2016   Procedure: INSERTION OF MESH;  Surgeon: Fanny Skates, MD;  Location: [facility];  Service: General;  Laterality: Right;   PROSTATECTOMY  1995   per Dr. Roni Bread    TONSILLECTOMY      SOCIAL HISTORY: Social History   Socioeconomic History   Marital status: Married    Spouse name: Not on  file   Number of children: 0   Years of education: 13   Highest education level: Not on file  Occupational History   Occupation: retired    Comment: [employer]  Tobacco Use   Smoking status: Former    Packs/day: 0.50    Years: 10.00    Pack years: 5.00    Types: Cigarettes    Quit date: 02/06/1972    Years since quitting: 49.0   Smokeless tobacco: Never  Vaping Use   Vaping Use: Never used  Substance and Sexual Activity   Alcohol use: Yes    Alcohol/week: 7.0 standard drinks    Types: 7 Standard drinks or equivalent per week    Comment: glass of wine daily; just about    Drug use: No   Sexual activity: Never  Other Topics Concern   Not on file  Social History Narrative   HSG, a couple of years of college. Married '78 - . No children. Work - [employer] 14 years - retired.   Lives alone with wife and two dogs. ACP - Yes CPR, Yes - short-term mechanical ventilation. Does not want prolonged heroic measures in the face of irreversible disease. HCPOA - wife. Alternative is his brother: David Benton [facility].       Right handed   Two story home   Drinks caffeine   Social Determinants of Health   Financial Resource Strain: Not on file  Food Insecurity: No Food Insecurity   Worried About Charity fundraiser in the Last Year: Never true   Ran Out of Food in the Last Year: Never true  Transportation Needs: Not on file  Physical Activity: Sufficiently Active   Days of Exercise per Week: 4 days   Minutes of Exercise per Session: 60 min  Stress: No Stress Concern Present   Feeling of Stress : Not at all  Social Connections: Socially Integrated   Frequency of Communication with Friends and Family: Three times a week   Frequency of Social Gatherings with Friends and Family: Three times a week   Attends Religious Services: More than 4 times per year   Active Member of Clubs or Organizations: Yes   Attends Music therapist: More than 4 times per year   Marital Status: Married  Human resources officer Violence: Not At Risk   Fear of Current or Ex-Partner: No   Emotionally Abused: No   Physically Abused: No    Sexually Abused: No    FAMILY HISTORY: Family History  Problem Relation Age of Onset   Heart attack Father 94       fatal at 3   Hypertension Father    CAD Father    Other Father        possible PD as says had "palsy"   Prostate cancer Brother    Diabetes Brother    Heart attack Mother    Diabetes Brother    Stroke Brother    Heart disease Brother        carotid artery disease   Hypertension Brother    Breast cancer Sister        mastectomy at 40   Colon cancer Neg Hx     ALLERGIES:  is allergic to codeine.  MEDICATIONS:  Current Outpatient Medications  Medication Sig Dispense Refill   ACETAMINOPHEN PO Take 650 mg by mouth every 6 (six) hours as needed for pain.      amoxicillin (AMOXIL) 500 MG  capsule Take 4 capsules (2,000 mg) one hour prior to all dental visits. 12 capsule 6   atorvastatin (LIPITOR) 40 MG tablet TAKE 1 TABLET BY MOUTH EVERY DAY 30 tablet 11   carbidopa-levodopa (SINEMET IR) 25-100 MG tablet TAKE 1 TABLET BY MOUTH 3 TIMES DAILY 90 tablet 0   levothyroxine (SYNTHROID) 75 MCG tablet TAKE 1 TABLET BY MOUTH EVERY DAY 90 tablet 0   magnesium hydroxide (MILK OF MAGNESIA) 400 MG/5ML suspension Take 30 mLs by mouth daily as needed. Reported on 04/21/2015     Probiotic Product (PROBIOTIC-10 ULTIMATE) CAPS Take 1 capsule by mouth daily.     saline (AYR) GEL Place 1 application into the nose in the morning and at bedtime.      sodium chloride (OCEAN) 0.65 % SOLN nasal spray Place 1 spray into both nostrils as needed for congestion. Reported on 04/21/2015     triamterene-hydrochlorothiazide (MAXZIDE-25) 37.5-25 MG tablet TAKE 1 TABLET BY MOUTH EVERY MORNING 90 tablet 0   warfarin (COUMADIN) 5 MG tablet TAKE AS DIRECTED BY COUMADIN CLINIC 50 tablet 3   No current facility-administered medications for this visit.    PHYSICAL EXAMINATION: ECOG PERFORMANCE STATUS: {CHL ONC ECOG [phone]}  There were no vitals filed for this visit. There were no vitals filed  for this visit. *** GENERAL:alert, no distress and comfortable SKIN: skin color, texture, turgor are normal, no rashes or significant lesions EYES: normal, Conjunctiva are pink and non-injected, sclera clear {OROPHARYNX:no exudate, no erythema and lips, buccal mucosa, and tongue normal}  NECK: supple, thyroid normal size, non-tender, without nodularity LYMPH:  no palpable lymphadenopathy in the cervical, axillary {or inguinal} LUNGS: clear to auscultation and percussion with normal breathing effort HEART: regular rate & rhythm and no murmurs and no lower extremity edema ABDOMEN:abdomen soft, non-tender and normal bowel sounds Musculoskeletal:no cyanosis of digits and no clubbing  NEURO: alert & oriented x 3 with fluent speech, no focal motor/sensory deficits  LABORATORY DATA:  I have reviewed the data as listed CBC Latest Ref Rng & Units 05/05/2020 04/25/2019 02/26/2018  WBC 4.0 - 10.5 K/uL 4.1 4.1 4.8  Hemoglobin 13.0 - 17.0 g/dL 13.5 14.5 14.2  Hematocrit 39.0 - 52.0 % 39.1 42.0 40.7  Platelets 150.0 - 400.0 K/uL 200.0 224.0 222.0    CMP Latest Ref Rng & Units 05/05/2020 04/25/2019 02/26/2018  Glucose 70 - 99 mg/dL 99 106(H) 102(H)  BUN 6 - 23 mg/dL 25(H) 19 21  Creatinine 0.40 - 1.50 mg/dL 0.97 0.96 0.97  Sodium 135 - 145 mEq/L 138 138 139  Potassium 3.5 - 5.1 mEq/L 4.5 3.8 4.2  Chloride 96 - 112 mEq/L 102 100 100  CO2 19 - 32 mEq/L 32 32 32  Calcium 8.4 - 10.5 mg/dL 9.1 9.4 9.6  Total Protein 6.0 - 8.3 g/dL 6.4 6.6 6.5  Total Bilirubin 0.2 - 1.2 mg/dL 1.4(H) 1.9(H) 1.9(H)  Alkaline Phos 39 - 117 U/L 102 89 101  AST 0 - 37 U/L 22 26 18   ALT 0 - 53 U/L 5 24 13      RADIOGRAPHIC STUDIES: I have personally reviewed the radiological images as listed and agreed with the findings in the report. NM PET Image Initial (PI) Skull Base To Thigh  Result Date: 02/03/2021 CLINICAL DATA:  Initial treatment strategy for right lower lobe pulmonary nodule and hepatic lesions. EXAM: NUCLEAR  MEDICINE PET SKULL BASE TO THIGH TECHNIQUE: 6.8 mCi F-18 FDG was injected intravenously. Full-ring PET imaging was performed from the skull base to thigh  after the radiotracer. CT data was obtained and used for attenuation correction and anatomic localization. Fasting blood glucose: 105 mg/dl COMPARISON:  CT chest 01/01/2021 and CT abdomen 01/28/2021 FINDINGS: Mediastinal blood pool activity: SUV max 2.43 Liver activity: SUV max NA NECK: Choose 1 Incidental CT findings: none CHEST: The wedge-shaped subpleural density in the medial right lower lobe is weakly hypermetabolic with SUV max of 9.14. This is un likely a primary lung neoplasm and more likely rounded atelectasis. No other worrisome pulmonary lesions are identified. A few tiny subpleural pulmonary nodules are stable. No enlarged or hypermetabolic mediastinal or hilar lymph nodes. No supraclavicular or axillary adenopathy. Incidental CT findings: Stable advanced vascular disease. ABDOMEN/PELVIS: Large ill-defined infiltrating mass in the central liver is hypermetabolic with SUV max of 7.82. Persistent biliary dilatation. Findings consistent with cholangiocarcinoma. Second smaller lesion in the left hepatic lobe has an SUV max of 3.60 and is likely a metastatic focus. There is also metastatic celiac axis lymphadenopathy with SUV max of 3.60. No retroperitoneal lymphadenopathy. Incidental CT findings: Advanced atherosclerotic calcifications involving the aorta, iliac arteries and branch vessels but no focal aneurysm. Advanced colonic diverticulosis. Status post prostatectomy. SKELETON: No findings to suggest osseous metastatic disease. Incidental CT findings: none IMPRESSION: 1. Large ill-defined infiltrating central liver mass is hypermetabolic and consistent with cholangiocarcinoma. Hypermetabolic metastatic focus in the left hepatic lobe. Celiac axis adenopathy. 2. The peripheral wedge-shaped right lower lobe pulmonary lesion is weakly hypermetabolic and more  likely rounded atelectasis and less likely neoplastic. Recommend continued surveillance. Electronically Signed   By: Marijo Sanes M.D.   On: 02/03/2021 16:41   CT ABDOMEN W WO CONTRAST  Result Date: 01/28/2021 CLINICAL DATA:  cholangiocarcinoma. EXAM: CT ABDOMEN WITHOUT AND WITH CONTRAST TECHNIQUE: Multidetector CT imaging of the abdomen was performed following the standard protocol before and following the bolus administration of intravenous contrast. RADIATION DOSE REDUCTION: This exam was performed according to the departmental dose-optimization program which includes automated exposure control, adjustment of the mA and/or kV according to patient size and/or use of iterative reconstruction technique. Creatinine was obtained on site at [facility] at [address] Results: Creatinine 1.2 mg/dL. CONTRAST:  72mL ISOVUE-370 IOPAMIDOL (ISOVUE-370) INJECTION 76% COMPARISON:  Chest CT January 01, 2021 FINDINGS: Lower chest: Pleural-based right lower lobe pulmonary nodule is similar in size measuring 14 x 10 mm previously 15 x 12 mm. Hepatobiliary: Within the central liver extending from the region of the gallbladder fossa there is an ill-defined 4.6 x 4.6 x 4.9 cm intrinsically hypodense mass which demonstrates delayed enhancement and obstructs the biliary tree with intrahepatic biliary ductal dilation. This distorts the hepatic hilar vasculature without invasion of the portal vein. There is cystic appearing structures located along both the medial and lateral aspects of the mass, with the lateral cystic structure measuring 2 cm on image 24/2 in medial cystic areas measuring 3 cm on image 22/2 with this medial structure appearing to possibly communicate with the common bile duct. Additionally, the inferior aspect of the mass abuts and invades the colonic wall at the hepatic flexure on coronal image 39/17 and sagittal image 50/8. Smaller hypodense segment III hepatic lesion which also demonstrates  delayed enhancement measures 17 x 15 mm on image 21/2. No extrahepatic biliary ductal dilation. Pancreas: No pancreatic ductal dilation or evidence of acute inflammation. Spleen: Normal in size without focal abnormality. Adrenals/Urinary Tract: Bilateral adrenal glands appear normal. No hydronephrosis. No nephrolithiasis. Bilateral renal scarring. Symmetric enhancement and excretion of contrast from the bilateral  kidneys. No suspicious renal mass. Stomach/Bowel: Radiopaque enteric contrast material traverses the splenic flexure. Stomach is unremarkable for degree of distension. No pathologic dilation of large or small bowel. Tumor involvement of the hepatic flexure of the colon described above. Vascular/Lymphatic: The portal, splenic and superior mesenteric veins are patent. Hepatic veins are patent. No evidence of tumor in portal or hepatic vein. Conventional hepatic arterial supply. Aortic atherosclerosis without aneurysmal dilation. No pathologically enlarged abdominal lymph nodes. Other: No abdominal ascites. Musculoskeletal: Multilevel degenerative changes spine. Lower thoracic vertebral body hemangioma. No aggressive lytic or blastic lesion of bone. IMPRESSION: 1. Ill-defined 4.9 cm hypodense mass which demonstrates delayed enhancement extending from the region of the gallbladder fossa and obstructs the biliary tree with intrahepatic biliary ductal dilation. Additionally, the inferior aspect of the mass abuts and invades the colonic wall at the hepatic flexure with obliteration of the gallbladder fossa. Findings are most consistent with cholangiocarcinoma. 2. Smaller hypodense segment III hepatic lesion which also demonstrates delayed enhancement, concerning for metastasis. 3. Pleural-based 14 mm right lower lobe pulmonary nodule is similar in size to most recent prior, possibly reflecting a metastatic lesion or primary bronchogenic tumor. 4. No pathologically enlarged abdominal lymph nodes. 5.  Aortic  Atherosclerosis (ICD10-I70.0). These results will be called to the ordering clinician or representative by the Radiologist Assistant, and communication documented in the PACS or [employer]. Electronically Signed   By: Dahlia Bailiff M.D.   On: 01/28/2021 18:44     No orders of the defined types were placed in this encounter.   All questions were answered. The patient knows to call the clinic with any problems, questions or concerns. The total time spent in the appointment was {CHL ONC TIME VISIT - VHQIO:[id number]}.     Aurea Graff 02/14/2021 10:49 AM  I, Wilburn Mylar, am acting as scribe for Truitt Merle, MD.   {Add scribe attestation statement}

## 2021-02-15 ENCOUNTER — Encounter (HOSPITAL_COMMUNITY): Payer: Self-pay | Admitting: Surgery

## 2021-02-15 ENCOUNTER — Inpatient Hospital Stay: Payer: Medicare Other | Admitting: Hematology

## 2021-02-15 DIAGNOSIS — K831 Obstruction of bile duct: Secondary | ICD-10-CM

## 2021-02-15 DIAGNOSIS — C801 Malignant (primary) neoplasm, unspecified: Secondary | ICD-10-CM

## 2021-02-15 DIAGNOSIS — Z952 Presence of prosthetic heart valve: Secondary | ICD-10-CM

## 2021-02-15 LAB — COMPREHENSIVE METABOLIC PANEL
ALT: 45 U/L — ABNORMAL HIGH (ref 0–44)
AST: 86 U/L — ABNORMAL HIGH (ref 15–41)
Albumin: 3.2 g/dL — ABNORMAL LOW (ref 3.5–5.0)
Alkaline Phosphatase: 564 U/L — ABNORMAL HIGH (ref 38–126)
Anion gap: 7 (ref 5–15)
BUN: 19 mg/dL (ref 8–23)
CO2: 28 mmol/L (ref 22–32)
Calcium: 8.9 mg/dL (ref 8.9–10.3)
Chloride: 101 mmol/L (ref 98–111)
Creatinine, Ser: 1.05 mg/dL (ref 0.61–1.24)
GFR, Estimated: >60 mL/min (ref >60)
Glucose, Bld: 107 mg/dL — ABNORMAL HIGH (ref 70–99)
Potassium: 3.5 mmol/L (ref 3.5–5.1)
Sodium: 136 mmol/L (ref 135–145)
Total Bilirubin: 2.4 mg/dL — ABNORMAL HIGH (ref 0.3–1.2)
Total Protein: 6.4 g/dL — ABNORMAL LOW (ref 6.5–8.1)

## 2021-02-15 LAB — CBC
HCT: 37.1 % — ABNORMAL LOW (ref 39.0–52.0)
Hemoglobin: 12.6 g/dL — ABNORMAL LOW (ref 13.0–17.0)
MCH: 30.4 pg (ref 26.0–34.0)
MCHC: 34 g/dL (ref 30.0–36.0)
MCV: 89.4 fL (ref 80.0–100.0)
Platelets: 212 10*3/uL (ref 150–400)
RBC: 4.15 MIL/uL — ABNORMAL LOW (ref 4.22–5.81)
RDW: 13.7 % (ref 11.5–15.5)
WBC: 4.1 10*3/uL (ref 4.0–10.5)
nRBC: 0 % (ref 0.0–0.2)

## 2021-02-15 LAB — RESP PANEL BY RT-PCR (FLU A&B, COVID) ARPGX2
Influenza A by PCR: NEGATIVE
Influenza B by PCR: NEGATIVE
SARS Coronavirus 2 by RT PCR: NEGATIVE

## 2021-02-15 LAB — PROTIME-INR
INR: 2.5 — ABNORMAL HIGH (ref 0.8–1.2)
Prothrombin Time: 26.8 seconds — ABNORMAL HIGH (ref 11.4–15.2)

## 2021-02-15 MED ORDER — ATORVASTATIN CALCIUM 40 MG PO TABS: 40.0000 mg | ORAL_TABLET | Freq: Every day | ORAL | Status: DC

## 2021-02-15 MED ORDER — ACETAMINOPHEN 325 MG PO TABS
650.0000 mg | ORAL_TABLET | Freq: Four times a day (QID) | ORAL | Status: DC | PRN
  Administered 2021-02-16: 650 mg via ORAL
  Filled 2021-02-15: qty 2

## 2021-02-15 MED ORDER — MAGNESIUM HYDROXIDE 400 MG/5ML PO SUSP: 30.0000 mL | Freq: Every day | ORAL | Status: DC | PRN

## 2021-02-15 MED ORDER — LEVOTHYROXINE SODIUM 75 MCG PO TABS
75.0000 ug | ORAL_TABLET | Freq: Every day | ORAL | Status: DC
  Administered 2021-02-15 – 2021-02-19 (×5): 75 ug via ORAL
  Filled 2021-02-15 (×5): qty 1

## 2021-02-15 MED ORDER — SALINE SPRAY 0.65 % NA SOLN: 1.0000 | Freq: Every evening | NASAL | Status: DC | PRN

## 2021-02-15 MED ORDER — CARBIDOPA-LEVODOPA 25-100 MG PO TABS
1.0000 | ORAL_TABLET | ORAL | Status: DC
  Administered 2021-02-15 – 2021-02-19 (×12): 1 via ORAL
  Filled 2021-02-15 (×12): qty 1

## 2021-02-15 NOTE — Consult Note (Signed)
Consultation  Referring Provider:  Dr. Michaelle Birks  Primary Care Physician:  [name], MD Primary Gastroenterologist:  Dr. Henrene Pastor       Reason for Consultation:     Abdominal pain, elevated liver functions in setting of liver mass.   Impression     Liver masses Positive hypermetabolic activity with PET scan, concerning for cholangiocarcinoma Oncology has been consulted on patient this hospitalization.  Chronic anticoagulation secondary to mechanical aortic valve replacement 02/15/2021 INR 2.5  Primary cardiologist is Dr. Burt Knack Monitor INR, will need below 2 prior to ERCP  Biliary obstruction due to malignant neoplasm (HCC) WBC 4.1 HGB 12.6 Platelets 212 AST 86 ALT 45  Alkphos 564 TBili 2.4 -Will monitor CBC and CMET -Continue supportive care -No fevers or chills, no leukocytosis at this time, consider antibiotics as needed. -n.p.o. at midnight. -Plan for ERCP tomorrow if INR allows, want below 2 -I thoroughly discussed procedure with the patient to include nature, alternatives, benefits, and risks (including but not limited to post ERCP pancreatitis, bleeding, infection, perforation, anesthesia/cardiac pulmonary complications).  Patient verbalized understanding and gave verbal consent to proceed with ERCP.   Thank you for your kind consultation, we will continue to follow.         HPI:   David Benton is a 88 y.o. male with past medical history significant for bicuspid aortic valve in 1991 mechanical on chronic anticoagulation, carotid artery stenosis, left bundle branch block normal Myoview 11/2020 low risk, Mnire's disease, hypertension, Parkinson's disease not on medications, history of prostate cancer, thoracic aneurysm followed by Dr. Pia Mau. Significant surgical history involves aortic valve replacement with [employer]. Jude's valve 1991, inguinal hernia repair 2018, prostatectomy 1995 and tonsillectomy.  Patient was referred for evaluation of liver masses for  surgery evaluation. On CT abdomen and chest surveillance imaging he was noted to have dilated intrahepatic bile ducts.  CT abdomen pelvis on 01/28/2021 showed a large mass in central liver in addition to mass left lateral liver and was referred to evaluation. Patient had PET scan that showed hypermetabolic activity of both liver masses and celiac adenopathy, lung nodule with low uptake thought to be nonneoplastic.  Patient was seen last week in the clinic for report of abdominal pain and decreased appetite for at least 2 weeks.  Patient was having nausea with vomiting as well. He denied jaundice, fever, chills, pruritus.  Was having possibly darker urine.  No change in bowel movements. Labs at that time were found to be significant for bilirubin of 2 and alkaline phosphatase of 900. Patient follows with Dr. Burt Knack patient's primary cardiologist, states okay to hold Coumadin for ERCP. Patient is in the room with his wife, states he is feeling well today actually had an appetite so did eat this morning.  Denies any nausea vomiting, abdominal pain.   Past Medical History:  Diagnosis Date   Bicuspid AS s/p mechanical AVR in 1991    Echocardiogram 10/22: EF 55-60, no RWMA, mod LVH, Gr 1 DD, normal RVSF, mild LAE, mild MR, AVR functioning normally with trivial AI, aortic root 43 mm, ascending aorta 45 mm, RVSP 40.7 mmHg   Bilateral carotid artery stenosis 07/10/2016   Carotid US 10/2018: Bilateral ICA 1-39; right subclavian 1.7 cm (no change since 03/2017) // Carotid US 10/21: Bilat ICA 1-39//Bilateral ICA 10/22: R 1-39; L 1-39   External hemorrhoids without mention of complication    Heart valve replaced by other means    LBBB (left bundle  branch block)    Myoview 11/22: EF 54, PVCs, no ischemia or scar; low risk   Long term (current) use of anticoagulants 09/15/2010   Meniere's disease    Other and unspecified hyperlipidemia    Other constipation    Parkinson disease ([place]) 02/2016   no meds,  sees [name]    Personal history of colonic polyps    Personal history of malignant neoplasm of prostate    Right inguinal hernia 11/18/2016   Shingles    SHINGLES 11/26/2009   Thoracic aneurysm without mention of rupture    followed by [name] with yearly CT scans    Unspecified essential hypertension     Surgical History:  He  has a past surgical history that includes Aortic valve replacement (1991); Hemorrhoid surgery; Tonsillectomy; Colonoscopy (06-28-10); Prostatectomy (1995); Inguinal hernia repair (Right, 11/18/2016); Insertion of mesh (Right, 11/18/2016); and Cataract extraction (Left, 12/2018). Family History:  His family history includes Breast cancer in his sister; CAD in his father; Diabetes in his brother and brother; Heart attack in his mother; Heart attack (age of onset: 52) in his father; Heart disease in his brother; Hypertension in his brother and father; Other in his father; Prostate cancer in his brother; Stroke in his brother. Social History:   reports that he quit smoking about 49 years ago. His smoking use included cigarettes. He has a 5.00 pack-year smoking history. He has never used smokeless tobacco. He reports current alcohol use of about 7.0 standard drinks per week. He reports that he does not use drugs.  Prior to Admission medications   Medication Sig Start Date End Date Taking? Authorizing Provider  ACETAMINOPHEN PO Take 650 mg by mouth every 6 (six) hours as needed for pain.    Yes [provider]  amoxicillin (AMOXIL) 500 MG capsule Take 4 capsules (2,000 mg) one hour prior to all dental visits. 05/17/19  Yes Weaver, Scott T, PA-C  atorvastatin (LIPITOR) 40 MG tablet Take 40 tablets by mouth daily. 01/12/21  Yes [provider]  carbidopa-levodopa (SINEMET IR) 25-100 MG tablet TAKE 1 TABLET BY MOUTH 3 TIMES DAILY Patient taking differently: Take 1 tablet by mouth 3 (three) times daily. 7AM/11AM/4PM 09/16/20  Yes Tat, Eustace Quail, DO   levothyroxine (SYNTHROID) 75 MCG tablet Take 75 mcg by mouth daily. 01/19/21  Yes [provider]  magnesium hydroxide (MILK OF MAGNESIA) 400 MG/5ML suspension Take 30 mLs by mouth daily. Reported on 04/21/2015   Yes [provider]  Probiotic Product (PROBIOTIC-10 ULTIMATE) CAPS Take 1 capsule by mouth daily.   Yes [provider]  saline (AYR) GEL Place 1 application into the nose in the morning and at bedtime.    Yes [provider]  sodium chloride (OCEAN) 0.65 % SOLN nasal spray Place 1 spray into both nostrils at bedtime as needed for congestion. Reported on 04/21/2015   Yes [provider]  triamterene-hydrochlorothiazide (MAXZIDE-25) 37.5-25 MG tablet TAKE 1 TABLET BY MOUTH EVERY MORNING Patient taking differently: Take 1 tablet by mouth daily. 02/12/21  Yes Sherren Mocha, MD  warfarin (COUMADIN) 5 MG tablet TAKE AS DIRECTED BY COUMADIN CLINIC Patient taking differently: Take 5-7.5 mg by mouth See admin instructions. Take 1 tablet on Mondays ONLY and 1 and half tablet on other days. 12/14/20  Yes Sherren Mocha, MD  atorvastatin (LIPITOR) 40 MG tablet TAKE 1 TABLET BY MOUTH EVERY DAY Patient not taking: Reported on 02/14/2021 01/12/21   Sherren Mocha, MD  levothyroxine (SYNTHROID) 75 MCG tablet  TAKE 1 TABLET BY MOUTH EVERY DAY Patient not taking: Reported on 02/14/2021 01/19/21   [name], MD    Current Facility-Administered Medications  Medication Dose Route Frequency Provider Last Rate Last Admin   acetaminophen (TYLENOL) tablet 650 mg  650 mg Oral Q6H PRN Dwan Bolt, MD       carbidopa-levodopa (SINEMET IR) 25-100 MG per tablet immediate release 1 tablet  1 tablet Oral 3 times per day Dwan Bolt, MD       levothyroxine (SYNTHROID) tablet 75 mcg  75 mcg Oral Q0600 Dwan Bolt, MD   75 mcg at 02/15/21 1610   magnesium hydroxide (MILK OF MAGNESIA) suspension 30 mL  30 mL Oral Daily PRN Dwan Bolt, MD       ondansetron  (ZOFRAN-ODT) disintegrating tablet 4 mg  4 mg Oral Q6H PRN Dwan Bolt, MD       Or   ondansetron [facility]) injection 4 mg  4 mg Intravenous Q6H PRN Dwan Bolt, MD       sodium chloride (OCEAN) 0.65 % nasal spray 1 spray  1 spray Each Nare QHS PRN Dwan Bolt, MD        Allergies as of 02/12/2021 - Review Complete 02/09/2021  Allergen Reaction Noted   Codeine  11/25/2009    Review of Systems:    Constitutional: No weight loss, fever, chills, weakness or fatigue HEENT: Eyes: No change in vision               Ears, Nose, Throat:  No change in hearing or congestion Skin: No rash or itching Cardiovascular: No chest pain, chest pressure or palpitations   Respiratory: No SOB or cough Gastrointestinal: See HPI and otherwise negative Genitourinary: No dysuria or change in urinary frequency Neurological: No headache, dizziness or syncope Musculoskeletal: No new muscle or joint pain Hematologic: No bleeding or bruising Psychiatric: No history of depression or anxiety     Physical Exam:  Vital signs in last 24 hours: Temp:  [97.4 F (36.3 C)-98.2 F (36.8 C)] 98.2 F (36.8 C) (01/30 0841) Pulse Rate:  [63-71] 69 (01/30 0841) Resp:  [15-18] 16 (01/30 0841) BP: (124-145)/(63-72) 124/71 (01/30 0841) SpO2:  [95 %-100 %] 95 % (01/30 0841) Weight:  [62.1 kg] 62.1 kg (01/29 2107) Last BM Date: 02/13/21  General:   Pleasant, well developed male in no acute distress Head:  Normocephalic and atraumatic. Eyes: sclerae anicteric,conjunctive pink  Heart: Regular rhythm, harsh mechanical murmur Pulm: Clear anteriorly; no wheezing Abdomen:  Soft, Obese AB,  active  bowel sounds.  No  tenderness . Without guarding and Without rebound, without hepatomegaly. Extremities:  Without edema. Msk:  Symmetrical without gross deformities. Peripheral pulses intact.  Neurologic:  Alert and  oriented x4;  grossly normal neurologically. Skin:   Dry and intact without significant lesions or  rashes. Psychiatric: Demonstrates good judgement and reason without abnormal affect or behaviors.  LAB RESULTS: Recent Labs    02/14/21 1432 02/15/21 0505  WBC 4.7 4.1  HGB 13.2 12.6*  HCT 39.4 37.1*  PLT 260 212   BMET Recent Labs    02/14/21 1432 02/15/21 0505  NA 137 136  K 3.6 3.5  CL 97* 101  CO2 32 28  GLUCOSE 160* 107*  BUN 23 19  CREATININE 1.33* 1.05  CALCIUM 9.5 8.9   LFT Recent Labs    02/15/21 0505  PROT 6.4*  ALBUMIN 3.2*  AST 86*  ALT 45*  ALKPHOS 564*  BILITOT 2.4*   PT/INR Recent Labs    02/14/21 1432 02/15/21 0505  LABPROT 25.4* 26.8*  INR 2.3* 2.5*    STUDIES: No results found.   Vladimir Crofts  02/15/2021, 10:31 AM

## 2021-02-15 NOTE — Consult Note (Addendum)
[place]  Telephone:([phone] Fax:(336) [phone]   MEDICAL ONCOLOGY - INITIAL CONSULTATION  Referral MD: Dr. Michaelle Birks  Reason for Referral: Liver mass  HPI: David Benton is an 88 year old male with a past medical history significant for bicuspid aortic valve in 1991 on chronic anticoagulation, carotid artery stenosis, left bundle branch block with normal Myoview 11/2020, Mnire's disease, hypertension, Parkinson's disease, history of prostate cancer, thoracic aneurysm followed by cardiothoracic surgery. The patient has been followed by CT surgery for a lung nodule and a sending aortic aneurysm.  He had surveillance imaging and he was noted to have dilated intrahepatic bile ducts.  CT chest without contrast was performed on 01/01/2021 which showed the right lower lobe pulmonary nodule which had enlarged and there was also abnormal appearance of the liver/biliary tree with intrahepatic and possible extrahepatic biliary duct dilatation with central hepatic hypoattenuation.  The findings were concerning for cholangiocarcinoma or distal common duct obstruction.  This was followed with a CT of the abdomen with and without contrast which was performed on 01/28/2021 which showed a 4.9 cm hypodense mass from the region of the gallbladder fossa and obstructs the biliary tree with intrahepatic biliary ductal dilatation, the inferior aspect of the mass abuts and invades the colonic wall at the hepatic flexure with obliteration of the gallbladder fossa and findings are most consistent with cholangiocarcinoma.  There was also a smaller hypodense segment 3 hepatic lesion which is concerning for metastasis, there was a pleural-based 14 mm right lower lobe pulmonary nodule which could reflect a metastatic lesion or primary bronchogenic tumor.  A PET scan was performed on 02/03/2021 which showed the large ill-defined infiltrating central liver mass was hypermetabolic and most consistent with  cholangiocarcinoma, hypermetabolic metastatic focus in the left hepatic lobe, celiac axis adenopathy, the peripheral wedge-shaped right lower lobe pulmonary lesion was weakly hypermetabolic and more likely rounded atelectasis and less likely neoplastic.  He was referred to Dr. Zenia Resides who saw him in the office recently and arrange for inpatient admission for ERCP with biopsy and stent placement.  His admission lab work has been reviewed and CBC notable for a hemoglobin of 12.6.  His chemistry showed an albumin of 3.2, AST 86, ALT 45, alk phos 564, and T bili 2.4.  His PT is 26.8 and INR is 2.5.  I met with the patient and his wife in his hospital room.  He has been ambulating in the hallway without any difficulty today.  The patient has been experiencing abdominal pain for at least 2 weeks, but pain improved at this time.  He reports that the pain was generalized.  He reports that his appetite has been decreased but improved since he has been here at the hospital.  He thinks that he may have lost some weight but he has not weighed recently.  He was having some nausea and vomiting as well.  He is not having any headaches, dizziness, chest pain, shortness of breath.  Denies bleeding.  The patient is married and has no children.  He does have 2 dogs.  Due to his Parkinson's disease, he tries to stay very active and boxes 3 times a week.  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  He is independent with ADLs and IADLs.  Reports that he previously smoked cigarettes for about 10 years and was exposed to secondhand smoke while growing up.  Reports that he drinks occasional wine and beer.  Family history significant for a brother with cancer-unclear  primary, brother with lung cancer, and another brother with bladder cancer. Medical oncology was asked see the patient and make recommendations regarding his liver mass  Past Medical History:  Diagnosis Date   Bicuspid AS s/p mechanical AVR in 1991     Echocardiogram 10/22: EF 55-60, no RWMA, mod LVH, Gr 1 DD, normal RVSF, mild LAE, mild MR, AVR functioning normally with trivial AI, aortic root 43 mm, ascending aorta 45 mm, RVSP 40.7 mmHg   Bilateral carotid artery stenosis 07/10/2016   Carotid US 10/2018: Bilateral ICA 1-39; right subclavian 1.7 cm (no change since 03/2017) // Carotid US 10/21: Bilat ICA 1-39//Bilateral ICA 10/22: R 1-39; L 1-39   External hemorrhoids without mention of complication    Heart valve replaced by other means    LBBB (left bundle branch block)    Myoview 11/22: EF 54, PVCs, no ischemia or scar; low risk   Long term (current) use of anticoagulants 09/15/2010   Meniere's disease    Other and unspecified hyperlipidemia    Other constipation    Parkinson disease ([place]) 02/2016   no meds, sees [name]    Personal history of colonic polyps    Personal history of malignant neoplasm of prostate    Right inguinal hernia 11/18/2016   Shingles    SHINGLES 11/26/2009   Thoracic aneurysm without mention of rupture    followed by [name] with yearly CT scans    Unspecified essential hypertension   :   Past Surgical History:  Procedure Laterality Date   AORTIC VALVE REPLACEMENT  1991    st. jude's valve, per [name]    CATARACT EXTRACTION Left 12/2018   COLONOSCOPY  06-28-10   per Dr. Henrene Pastor, benign polyps, no further scopes needed    [place] Right 11/18/2016   Procedure: OPEN REPAIR RIGHT INGUINAL HERNIA WITH MESH;  Surgeon: Fanny Skates, MD;  Location: [place];  Service: General;  Laterality: Right;   INSERTION OF MESH Right 11/18/2016   Procedure: INSERTION OF MESH;  Surgeon: Fanny Skates, MD;  Location: [place];  Service: General;  Laterality: Right;   PROSTATECTOMY  1995   per Dr. Roni Bread    TONSILLECTOMY    :   Current Facility-Administered Medications  Medication Dose Route Frequency Provider Last Rate Last Admin    ondansetron (ZOFRAN-ODT) disintegrating tablet 4 mg  4 mg Oral Q6H PRN Dwan Bolt, MD       Or   ondansetron (ZOFRAN) injection 4 mg  4 mg Intravenous Q6H PRN Dwan Bolt, MD          Allergies  Allergen Reactions   Codeine Nausea And Vomiting  :   Family History  Problem Relation Age of Onset   Heart attack Father 8       fatal at 81   Hypertension Father    CAD Father    Other Father        possible PD as says had "palsy"   Prostate cancer Brother    Diabetes Brother    Heart attack Mother    Diabetes Brother    Stroke Brother    Heart disease Brother        carotid artery disease   Hypertension Brother    Breast cancer Sister        mastectomy at 8   Colon cancer Neg Hx   :   Social History   Socioeconomic  History   Marital status: Married    Spouse name: Not on file   Number of children: 0   Years of education: 38   Highest education level: Not on file  Occupational History   Occupation: retired    Comment: [employer]  Tobacco Use   Smoking status: Former    Packs/day: 0.50    Years: 10.00    Pack years: 5.00    Types: Cigarettes    Quit date: 02/06/1972    Years since quitting: 49.0   Smokeless tobacco: Never  Vaping Use   Vaping Use: Never used  Substance and Sexual Activity   Alcohol use: Yes    Alcohol/week: 7.0 standard drinks    Types: 7 Standard drinks or equivalent per week    Comment: glass of wine daily; just about    Drug use: No   Sexual activity: Never  Other Topics Concern   Not on file  Social History Narrative   HSG, a couple of years of college. Married '78 - . No children. Work - [employer] 74 years - retired.   Lives alone with wife and two dogs. ACP - Yes CPR, Yes - short-term mechanical ventilation. Does not want prolonged heroic measures in the face of irreversible disease. HCPOA - wife. Alternative is his brother: N[facility].       Right handed   Two story home   Drinks  caffeine   Social Determinants of Health   Financial Resource Strain: Not on file  Food Insecurity: No Food Insecurity   Worried About Charity fundraiser in the Last Year: Never true   Ran Out of Food in the Last Year: Never true  Transportation Needs: Not on file  Physical Activity: Sufficiently Active   Days of Exercise per Week: 4 days   Minutes of Exercise per Session: 60 min  Stress: No Stress Concern Present   Feeling of Stress : Not at all  Social Connections: Socially Integrated   Frequency of Communication with Friends and Family: Three times a week   Frequency of Social Gatherings with Friends and Family: Three times a week   Attends Religious Services: More than 4 times per year   Active Member of Clubs or Organizations: Yes   Attends Music therapist: More than 4 times per year   Marital Status: Married  Human resources officer Violence: Not At Risk   Fear of Current or Ex-Partner: No   Emotionally Abused: No   Physically Abused: No   Sexually Abused: No  :  Review of Systems: A comprehensive 14 point review of systems was negative except as noted in the HPI.  Exam: Patient Vitals for the past 24 hrs:  BP Temp Temp src Pulse Resp SpO2 Height Weight  02/15/21 0356 129/72 97.9 F (36.6 C) Oral 68 18 100 % -- --  02/14/21 2107 -- -- -- -- -- -- _0  (1.778 m) 62.1 kg  02/14/21 2026 128/68 (!) 97.4 F (36.3 C) Oral 63 16 100 % -- --  02/14/21 1439 (!) 145/63 98.1 F (36.7 C) Oral 71 15 98 % -- --    General:  well-nourished in no acute distress.   Eyes:  no scleral icterus.   ENT:  There were no oropharyngeal lesions.     Lymphatics:  Negative cervical, supraclavicular or axillary adenopathy.   Respiratory: lungs were clear bilaterally without wheezing or crackles.   Cardiovascular:  Regular rate and  rhythm, S1/S2, without murmur, rub or gallop.  There was no pedal edema.   GI:  abdomen was soft, flat, nontender, nondistended, without organomegaly.      Skin exam was without echymosis, petichae.   Neuro exam was nonfocal. Patient was alert and oriented.  Attention was good.   Language was appropriate.  Mood was normal without depression.  Speech was not pressured.  Thought content was not tangential.     Lab Results  Component Value Date   WBC 4.1 02/15/2021   HGB 12.6 (L) 02/15/2021   HCT 37.1 (L) 02/15/2021   PLT 212 02/15/2021   GLUCOSE 107 (H) 02/15/2021   CHOL 112 05/05/2020   TRIG 58.0 05/05/2020   HDL 42.40 05/05/2020   LDLCALC 58 05/05/2020   ALT 45 (H) 02/15/2021   AST 86 (H) 02/15/2021   NA 136 02/15/2021   K 3.5 02/15/2021   CL 101 02/15/2021   CREATININE 1.05 02/15/2021   BUN 19 02/15/2021   CO2 28 02/15/2021    NM PET Image Initial (PI) Skull Base To Thigh  Result Date: 02/03/2021 CLINICAL DATA:  Initial treatment strategy for right lower lobe pulmonary nodule and hepatic lesions. EXAM: NUCLEAR MEDICINE PET SKULL BASE TO THIGH TECHNIQUE: 6.8 mCi F-18 FDG was injected intravenously. Full-ring PET imaging was performed from the skull base to thigh after the radiotracer. CT data was obtained and used for attenuation correction and anatomic localization. Fasting blood glucose: 105 mg/dl COMPARISON:  CT chest 01/01/2021 and CT abdomen 01/28/2021 FINDINGS: Mediastinal blood pool activity: SUV max 2.43 Liver activity: SUV max NA NECK: Choose 1 Incidental CT findings: none CHEST: The wedge-shaped subpleural density in the medial right lower lobe is weakly hypermetabolic with SUV max of 6.80. This is un likely a primary lung neoplasm and more likely rounded atelectasis. No other worrisome pulmonary lesions are identified. A few tiny subpleural pulmonary nodules are stable. No enlarged or hypermetabolic mediastinal or hilar lymph nodes. No supraclavicular or axillary adenopathy. Incidental CT findings: Stable advanced vascular disease. ABDOMEN/PELVIS: Large ill-defined infiltrating mass in the central liver is hypermetabolic with SUV  max of 6.22. Persistent biliary dilatation. Findings consistent with cholangiocarcinoma. Second smaller lesion in the left hepatic lobe has an SUV max of 3.60 and is likely a metastatic focus. There is also metastatic celiac axis lymphadenopathy with SUV max of 3.60. No retroperitoneal lymphadenopathy. Incidental CT findings: Advanced atherosclerotic calcifications involving the aorta, iliac arteries and branch vessels but no focal aneurysm. Advanced colonic diverticulosis. Status post prostatectomy. SKELETON: No findings to suggest osseous metastatic disease. Incidental CT findings: none IMPRESSION: 1. Large ill-defined infiltrating central liver mass is hypermetabolic and consistent with cholangiocarcinoma. Hypermetabolic metastatic focus in the left hepatic lobe. Celiac axis adenopathy. 2. The peripheral wedge-shaped right lower lobe pulmonary lesion is weakly hypermetabolic and more likely rounded atelectasis and less likely neoplastic. Recommend continued surveillance. Electronically Signed   By: Marijo Sanes M.D.   On: 02/03/2021 16:41   CT ABDOMEN W WO CONTRAST  Result Date: 01/28/2021 CLINICAL DATA:  cholangiocarcinoma. EXAM: CT ABDOMEN WITHOUT AND WITH CONTRAST TECHNIQUE: Multidetector CT imaging of the abdomen was performed following the standard protocol before and following the bolus administration of intravenous contrast. RADIATION DOSE REDUCTION: This exam was performed according to the departmental dose-optimization program which includes automated exposure control, adjustment of the mA and/or kV according to patient size and/or use of iterative reconstruction technique. Creatinine was obtained on site at [place] at [address] Results: Creatinine 1.2  mg/dL. CONTRAST:  54m ISOVUE-370 IOPAMIDOL (ISOVUE-370) INJECTION 76% COMPARISON:  Chest CT January 01, 2021 FINDINGS: Lower chest: Pleural-based right lower lobe pulmonary nodule is similar in size measuring 14 x 10 mm  previously 15 x 12 mm. Hepatobiliary: Within the central liver extending from the region of the gallbladder fossa there is an ill-defined 4.6 x 4.6 x 4.9 cm intrinsically hypodense mass which demonstrates delayed enhancement and obstructs the biliary tree with intrahepatic biliary ductal dilation. This distorts the hepatic hilar vasculature without invasion of the portal vein. There is cystic appearing structures located along both the medial and lateral aspects of the mass, with the lateral cystic structure measuring 2 cm on image 24/2 in medial cystic areas measuring 3 cm on image 22/2 with this medial structure appearing to possibly communicate with the common bile duct. Additionally, the inferior aspect of the mass abuts and invades the colonic wall at the hepatic flexure on coronal image 39/17 and sagittal image 50/8. Smaller hypodense segment III hepatic lesion which also demonstrates delayed enhancement measures 17 x 15 mm on image 21/2. No extrahepatic biliary ductal dilation. Pancreas: No pancreatic ductal dilation or evidence of acute inflammation. Spleen: Normal in size without focal abnormality. Adrenals/Urinary Tract: Bilateral adrenal glands appear normal. No hydronephrosis. No nephrolithiasis. Bilateral renal scarring. Symmetric enhancement and excretion of contrast from the bilateral kidneys. No suspicious renal mass. Stomach/Bowel: Radiopaque enteric contrast material traverses the splenic flexure. Stomach is unremarkable for degree of distension. No pathologic dilation of large or small bowel. Tumor involvement of the hepatic flexure of the colon described above. Vascular/Lymphatic: The portal, splenic and superior mesenteric veins are patent. Hepatic veins are patent. No evidence of tumor in portal or hepatic vein. Conventional hepatic arterial supply. Aortic atherosclerosis without aneurysmal dilation. No pathologically enlarged abdominal lymph nodes. Other: No abdominal ascites. Musculoskeletal:  Multilevel degenerative changes spine. Lower thoracic vertebral body hemangioma. No aggressive lytic or blastic lesion of bone. IMPRESSION: 1. Ill-defined 4.9 cm hypodense mass which demonstrates delayed enhancement extending from the region of the gallbladder fossa and obstructs the biliary tree with intrahepatic biliary ductal dilation. Additionally, the inferior aspect of the mass abuts and invades the colonic wall at the hepatic flexure with obliteration of the gallbladder fossa. Findings are most consistent with cholangiocarcinoma. 2. Smaller hypodense segment III hepatic lesion which also demonstrates delayed enhancement, concerning for metastasis. 3. Pleural-based 14 mm right lower lobe pulmonary nodule is similar in size to most recent prior, possibly reflecting a metastatic lesion or primary bronchogenic tumor. 4. No pathologically enlarged abdominal lymph nodes. 5.  Aortic Atherosclerosis (ICD10-I70.0). These results will be called to the ordering clinician or representative by the Radiologist Assistant, and communication documented in the PACS or [employer] Electronically Signed   By: JDahlia BailiffM.D.   On: 01/28/2021 18:44     NM PET Image Initial (PI) Skull Base To Thigh  Result Date: 02/03/2021 CLINICAL DATA:  Initial treatment strategy for right lower lobe pulmonary nodule and hepatic lesions. EXAM: NUCLEAR MEDICINE PET SKULL BASE TO THIGH TECHNIQUE: 6.8 mCi F-18 FDG was injected intravenously. Full-ring PET imaging was performed from the skull base to thigh after the radiotracer. CT data was obtained and used for attenuation correction and anatomic localization. Fasting blood glucose: 105 mg/dl COMPARISON:  CT chest 01/01/2021 and CT abdomen 01/28/2021 FINDINGS: Mediastinal blood pool activity: SUV max 2.43 Liver activity: SUV max NA NECK: Choose 1 Incidental CT findings: none CHEST: The wedge-shaped subpleural density in the medial right lower lobe  is weakly hypermetabolic with SUV  max of 2.94. This is un likely a primary lung neoplasm and more likely rounded atelectasis. No other worrisome pulmonary lesions are identified. A few tiny subpleural pulmonary nodules are stable. No enlarged or hypermetabolic mediastinal or hilar lymph nodes. No supraclavicular or axillary adenopathy. Incidental CT findings: Stable advanced vascular disease. ABDOMEN/PELVIS: Large ill-defined infiltrating mass in the central liver is hypermetabolic with SUV max of 9.21. Persistent biliary dilatation. Findings consistent with cholangiocarcinoma. Second smaller lesion in the left hepatic lobe has an SUV max of 3.60 and is likely a metastatic focus. There is also metastatic celiac axis lymphadenopathy with SUV max of 3.60. No retroperitoneal lymphadenopathy. Incidental CT findings: Advanced atherosclerotic calcifications involving the aorta, iliac arteries and branch vessels but no focal aneurysm. Advanced colonic diverticulosis. Status post prostatectomy. SKELETON: No findings to suggest osseous metastatic disease. Incidental CT findings: none IMPRESSION: 1. Large ill-defined infiltrating central liver mass is hypermetabolic and consistent with cholangiocarcinoma. Hypermetabolic metastatic focus in the left hepatic lobe. Celiac axis adenopathy. 2. The peripheral wedge-shaped right lower lobe pulmonary lesion is weakly hypermetabolic and more likely rounded atelectasis and less likely neoplastic. Recommend continued surveillance. Electronically Signed   By: Marijo Sanes M.D.   On: 02/03/2021 16:41   CT ABDOMEN W WO CONTRAST  Result Date: 01/28/2021 CLINICAL DATA:  cholangiocarcinoma. EXAM: CT ABDOMEN WITHOUT AND WITH CONTRAST TECHNIQUE: Multidetector CT imaging of the abdomen was performed following the standard protocol before and following the bolus administration of intravenous contrast. RADIATION DOSE REDUCTION: This exam was performed according to the departmental dose-optimization program which includes  automated exposure control, adjustment of the mA and/or kV according to patient size and/or use of iterative reconstruction technique. Creatinine was obtained on site at [place] at [address] Results: Creatinine 1.2 mg/dL. CONTRAST:  16m ISOVUE-370 IOPAMIDOL (ISOVUE-370) INJECTION 76% COMPARISON:  Chest CT January 01, 2021 FINDINGS: Lower chest: Pleural-based right lower lobe pulmonary nodule is similar in size measuring 14 x 10 mm previously 15 x 12 mm. Hepatobiliary: Within the central liver extending from the region of the gallbladder fossa there is an ill-defined 4.6 x 4.6 x 4.9 cm intrinsically hypodense mass which demonstrates delayed enhancement and obstructs the biliary tree with intrahepatic biliary ductal dilation. This distorts the hepatic hilar vasculature without invasion of the portal vein. There is cystic appearing structures located along both the medial and lateral aspects of the mass, with the lateral cystic structure measuring 2 cm on image 24/2 in medial cystic areas measuring 3 cm on image 22/2 with this medial structure appearing to possibly communicate with the common bile duct. Additionally, the inferior aspect of the mass abuts and invades the colonic wall at the hepatic flexure on coronal image 39/17 and sagittal image 50/8. Smaller hypodense segment III hepatic lesion which also demonstrates delayed enhancement measures 17 x 15 mm on image 21/2. No extrahepatic biliary ductal dilation. Pancreas: No pancreatic ductal dilation or evidence of acute inflammation. Spleen: Normal in size without focal abnormality. Adrenals/Urinary Tract: Bilateral adrenal glands appear normal. No hydronephrosis. No nephrolithiasis. Bilateral renal scarring. Symmetric enhancement and excretion of contrast from the bilateral kidneys. No suspicious renal mass. Stomach/Bowel: Radiopaque enteric contrast material traverses the splenic flexure. Stomach is unremarkable for degree of distension.  No pathologic dilation of large or small bowel. Tumor involvement of the hepatic flexure of the colon described above. Vascular/Lymphatic: The portal, splenic and superior mesenteric veins are patent. Hepatic veins are patent. No evidence of tumor in  portal or hepatic vein. Conventional hepatic arterial supply. Aortic atherosclerosis without aneurysmal dilation. No pathologically enlarged abdominal lymph nodes. Other: No abdominal ascites. Musculoskeletal: Multilevel degenerative changes spine. Lower thoracic vertebral body hemangioma. No aggressive lytic or blastic lesion of bone. IMPRESSION: 1. Ill-defined 4.9 cm hypodense mass which demonstrates delayed enhancement extending from the region of the gallbladder fossa and obstructs the biliary tree with intrahepatic biliary ductal dilation. Additionally, the inferior aspect of the mass abuts and invades the colonic wall at the hepatic flexure with obliteration of the gallbladder fossa. Findings are most consistent with cholangiocarcinoma. 2. Smaller hypodense segment III hepatic lesion which also demonstrates delayed enhancement, concerning for metastasis. 3. Pleural-based 14 mm right lower lobe pulmonary nodule is similar in size to most recent prior, possibly reflecting a metastatic lesion or primary bronchogenic tumor. 4. No pathologically enlarged abdominal lymph nodes. 5.  Aortic Atherosclerosis (ICD10-I70.0). These results will be called to the ordering clinician or representative by the Radiologist Assistant, and communication documented in the PACS or [employer]. Electronically Signed   By: Dahlia Bailiff M.D.   On: 01/28/2021 18:44    Assessment and Plan:  1.  Liver mass concerning for cholangiocarcinoma 2.  Biliary obstruction secondary to mass 3.  Chronic anticoagulation secondary to mechanical aortic valve replacement 4.  Parkinson's disease 5.  History of prostate cancer, status postsurgery 6.  Hypertension 7.  Ascending aortic  aneurysm  -Discussed CT scan findings with the patient and his wife.  We discussed findings are concerning for cholangiocarcinoma.  However, we need a biopsy to confirm the diagnosis.  Once we have this information, we can have further discussion regarding treatment options.  His performance status would be estimated to be about 1 and he appears to be a candidate for systemic treatment. -He has been seen by GI we will proceed with ERCP and stent placement tomorrow if INR below 2. -Continue to hold anticoagulation while awaiting ERCP. -We will arrange outpatient follow-up at the cancer center to discuss the biopsy results and treatment options.  Thank you for this referral.   Mikey Bussing, DNP, AGPCNP-BC, AOCNP   Addendum  I have seen the patient, examined him. I agree with the assessment and and plan and have edited the notes.   A very nice 88 yo male with PMH of hypertension, bicuspid aortic valve on Coumadin, ascending aortic aneurysm, Parkinson's disease, prostate cancer, was found to have a liver lesion during his CT chest for lung nodule.  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 The lung node is not hypermetabolic on PET, probably benign.  Patient has met pancreatobiliary surgeon Dr. Zenia Resides, and felt not to be a candidate for surgical resection.  He is awaiting for ERCP tomorrow for biopsy and stent placement. Hopefully we will get tissue diagnosis and enough tissue to NGS such as [facility].  Due to his advanced age, and medical comorbidities, cytotoxic chemotherapy will be difficult for him, but he will be a candidate for immunotherapy or targeted therapy if indicated by NGS of his tumor. If tissue is not adequate for NGS,then I will get [facility] [employer] as outpt.  I will also present his case in GI tumor conference this week, to see if he is a candidate for liver targeted  therapy such as Y 90 or radiation.  Due to his regional lymph adenopathy, liver targeted therapy is probably less beneficial. I will f/u  after his discharge.   Truitt Merle  02/15/2021

## 2021-02-15 NOTE — Progress Notes (Signed)
Oncology Discharge Planning Admission Note  [facility] Address: [address], [place], [place] [address] Hours of Operation:  8am - 5pm, Monday - Friday  Clinic Contact Information:  [phone]) [phone]  Oncology Care Team: Medical Oncologist:  Dr. Octavio Graves, NP is aware of this hospital admission dated 02/14/21 and has assessed patient at bedside. The cancer center will follow David Benton inpatient care to assist with discharge planning as indicated by the oncologist.    Disclaimer:  This [facility] note does not imply a formal consult request has been made by the admitting attending for this admission or there will be an inpatient consult completed by oncology.  Please request oncology consults as per standard process as indicated.

## 2021-02-15 NOTE — Progress Notes (Signed)
Mobility Specialist Progress Note   02/15/21 1650  Mobility  Activity Ambulated independently in room  Level of Assistance Independent  Assistive Device None  Distance Ambulated (ft) 1650 ft  Activity Response Tolerated well  $Mobility charge 1 Mobility   Received pt in standing in room having no complaints and agreeable to mobility. Asymptomatic throughout ambulation, returned back to EOB w/ call bell in reach and all needs met.  Holland Falling Mobility Specialist Phone Number [phone]

## 2021-02-16 ENCOUNTER — Telehealth: Payer: Self-pay | Admitting: Neurology

## 2021-02-16 LAB — PROTIME-INR
INR: 2.2 — ABNORMAL HIGH (ref 0.8–1.2)
Prothrombin Time: 24.3 seconds — ABNORMAL HIGH (ref 11.4–15.2)

## 2021-02-16 MED ORDER — MELATONIN 5 MG PO TABS
5.0000 mg | ORAL_TABLET | Freq: Every day | ORAL | Status: DC
  Administered 2021-02-16 – 2021-02-18 (×3): 5 mg via ORAL
  Filled 2021-02-16 (×3): qty 1

## 2021-02-16 MED ORDER — TRIAMTERENE-HCTZ 37.5-25 MG PO TABS
1.0000 | ORAL_TABLET | Freq: Every day | ORAL | Status: DC
  Administered 2021-02-16 – 2021-02-19 (×3): 1 via ORAL
  Filled 2021-02-16 (×4): qty 1

## 2021-02-16 NOTE — Telephone Encounter (Signed)
Patients wife wants to make sure tat knows narvel is in hospital and they are keeping her updated

## 2021-02-16 NOTE — Progress Notes (Signed)
° ° °   °  Subjective: No acute changes. INR 2.2 today. Patient is afebrile, denies chills.   Objective: Vital signs in last 24 hours: Temp:  [98 F (36.7 C)-98.5 F (36.9 C)] 98.2 F (36.8 C) (01/31 0547) Pulse Rate:  [62-69] 67 (01/31 0547) Resp:  [16-18] 18 (01/31 0547) BP: (124-138)/(67-72) 138/72 (01/31 0547) SpO2:  [95 %-100 %] 99 % (01/31 0547) Last BM Date: 02/14/21  Intake/Output from previous day: 01/30 0701 - 01/31 0700 In: 300 [P.O.:300] Out: -  Intake/Output this shift: No intake/output data recorded.  PE: General: resting comfortably, NAD Neuro: alert and oriented, no focal deficits Resp: normal work of breathing on room air Abdomen: soft, nondistended, nontender to palpation.  Extremities: warm and well-perfused   Lab Results:  Recent Labs    02/14/21 1432 02/15/21 0505  WBC 4.7 4.1  HGB 13.2 12.6*  HCT 39.4 37.1*  PLT 260 212   BMET Recent Labs    02/14/21 1432 02/15/21 0505  NA 137 136  K 3.6 3.5  CL 97* 101  CO2 32 28  GLUCOSE 160* 107*  BUN 23 19  CREATININE 1.33* 1.05  CALCIUM 9.5 8.9   PT/INR Recent Labs    02/15/21 0505 02/16/21 0448  LABPROT 26.8* 24.3*  INR 2.5* 2.2*   CMP     Component Value Date/Time   NA 136 02/15/2021 0505   NA 142 07/08/2016 1449   K 3.5 02/15/2021 0505   CL 101 02/15/2021 0505   CO2 28 02/15/2021 0505   GLUCOSE 107 (H) 02/15/2021 0505   BUN 19 02/15/2021 0505   BUN 26 07/08/2016 1449   CREATININE 1.05 02/15/2021 0505   CREATININE 1.09 07/30/2014 1400   CALCIUM 8.9 02/15/2021 0505   PROT 6.4 (L) 02/15/2021 0505   PROT 6.6 07/08/2016 1449   ALBUMIN 3.2 (L) 02/15/2021 0505   ALBUMIN 4.5 07/08/2016 1449   AST 86 (H) 02/15/2021 0505   ALT 45 (H) 02/15/2021 0505   ALKPHOS 564 (H) 02/15/2021 0505   BILITOT 2.4 (H) 02/15/2021 0505   BILITOT 1.7 (H) 07/08/2016 1449   GFRNONAA >60 02/15/2021 0505   GFRAA >60 11/17/2016 1026   Lipase  No results found for: LIPASE     Studies/Results: No  results found.  Anti-infectives: Anti-infectives (From admission, onward)    None        Assessment/Plan 88 yo male with large intrahepatic mass, likely cholangiocarcinoma, with biliary obstruction. - INR too high for ERCP today. Continue daily INR. - Regular diet today - Home meds as appropriate - VTE: SCDs, home Coumadin on hold for procedure - Dispo: inpatient, med-surg floor, awaiting ERCP with biopsy and stent placement     LOS: 2 days    Michaelle Birks, MD [facility] Surgery General, Hepatobiliary and Pancreatic Surgery 02/16/21 7:52 AM

## 2021-02-16 NOTE — Progress Notes (Signed)
Mobility Specialist Progress Note   02/16/21 1050  Mobility  Activity Ambulated independently in hallway  Level of Assistance Independent  Assistive Device None  Distance Ambulated (ft) 2700 ft  Activity Response Tolerated well  $Mobility charge 1 Mobility   Received pt in chair having no complaints and agreeable to mobility. Asymptomatic throughout ambulation, returned back to chair w/ call bell in reach and all needs met.  Holland Falling Mobility Specialist Phone Number [phone]

## 2021-02-16 NOTE — H&P (View-Only) (Signed)
° ° °  Gastroenterology Inpatient Follow Up    Subjective: Patient is feeling fairly well. Disappointed about having to wait on ERCP. INR is 2.2 this morning.  Objective: Vital signs in last 24 hours: Temp:  [97.7 F (36.5 C)-98.5 F (36.9 C)] 97.7 F (36.5 C) (01/31 0753) Pulse Rate:  [62-71] 71 (01/31 0753) Resp:  [17-19] 19 (01/31 0753) BP: (131-167)/(67-72) 167/68 (01/31 0753) SpO2:  [99 %-100 %] 100 % (01/31 0753) Last BM Date: 02/14/21  Intake/Output from previous day: 01/30 0701 - 01/31 0700 In: 300 [P.O.:300] Out: -  Intake/Output this shift: Total I/O In: 240 [P.O.:240] Out: -   General appearance: alert and cooperative Resp: no increased WOB Cardio: regular rate GI: soft, mild ab discomfort upon palpation Extremities: extremities normal, atraumatic, no cyanosis or edema  Lab Results: Recent Labs    02/14/21 1432 02/15/21 0505  WBC 4.7 4.1  HGB 13.2 12.6*  HCT 39.4 37.1*  PLT 260 212   BMET Recent Labs    02/14/21 1432 02/15/21 0505  NA 137 136  K 3.6 3.5  CL 97* 101  CO2 32 28  GLUCOSE 160* 107*  BUN 23 19  CREATININE 1.33* 1.05  CALCIUM 9.5 8.9   LFT Recent Labs    02/15/21 0505  PROT 6.4*  ALBUMIN 3.2*  AST 86*  ALT 45*  ALKPHOS 564*  BILITOT 2.4*   PT/INR Recent Labs    02/15/21 0505 02/16/21 0448  LABPROT 26.8* 24.3*  INR 2.5* 2.2*   Hepatitis Panel No results for input(s): HEPBSAG, HCVAB, HEPAIGM, HEPBIGM in the last 72 hours. C-Diff No results for input(s): CDIFFTOX in the last 72 hours.  Studies/Results: No results found.  Medications: I have reviewed the patient's current medications. Scheduled:  carbidopa-levodopa  1 tablet Oral 3 times per day   levothyroxine  75 mcg Oral Q0600   triamterene-hydrochlorothiazide  1 tablet Oral Daily   Continuous: RXV:[id number], magnesium hydroxide, ondansetron **OR** ondansetron (ZOFRAN) IV, sodium chloride  Assessment/Plan: 15M w/hx of mechanical aortic valve on  warfarin, Parkinson's disease, prior prostate cancer p/w ab discomfort. Denies jaundice, vomiting, bleeding, dysphagia. The patient's last dose of warfarin was on 1/28. LFTs elevated upon arrival to AST 86, ALT 45, alk phos 564, T bili 2.4. INR today is 2.2. CT shows dilated intrahepatic bile ducts and ill defined mass extending from the region of the gallbladder fossa that is concerning for cholangiocarcinoma. PET scan shows large ill-defined infiltrating central liver mass that is most consistent with cholangiocarcinoma.   - ERCP with Dr. Rush Landmark once INR <2. NPO after MN in case procedure is offered tomorrow. Holding warfarin.   LOS: 2 days   Sharyn Creamer 02/16/2021, 1:08 PM

## 2021-02-16 NOTE — Progress Notes (Signed)
° ° °  Gastroenterology Inpatient Follow Up  ° ° °Subjective: °Patient is feeling fairly well. Disappointed about having to wait on ERCP. INR is 2.2 this morning. ° °Objective: °Vital signs in last 24 hours: °Temp:  [97.7 °F (36.5 °C)-98.5 °F (36.9 °C)] 97.7 °F (36.5 °C) (01/31 0753) °Pulse Rate:  [62-71] 71 (01/31 0753) °Resp:  [17-19] 19 (01/31 0753) °BP: (131-167)/(67-72) 167/68 (01/31 0753) °SpO2:  [99 %-100 %] 100 % (01/31 0753) °Last BM Date: 02/14/21 ° °Intake/Output from previous day: °01/30 0701 - 01/31 0700 °In: 300 [P.O.:300] °Out: -  °Intake/Output this shift: °Total I/O °In: 240 [P.O.:240] °Out: -  ° °General appearance: alert and cooperative °Resp: no increased WOB °Cardio: regular rate °GI: soft, mild ab discomfort upon palpation °Extremities: extremities normal, atraumatic, no cyanosis or edema ° °Lab Results: °Recent Labs  °  02/14/21 °1432 02/15/21 °0505  °WBC 4.7 4.1  °HGB 13.2 12.6*  °HCT 39.4 37.1*  °PLT 260 212  ° °BMET °Recent Labs  °  02/14/21 °1432 02/15/21 °0505  °NA 137 136  °K 3.6 3.5  °CL 97* 101  °CO2 32 28  °GLUCOSE 160* 107*  °BUN 23 19  °CREATININE 1.33* 1.05  °CALCIUM 9.5 8.9  ° °LFT °Recent Labs  °  02/15/21 °0505  °PROT 6.4*  °ALBUMIN 3.2*  °AST 86*  °ALT 45*  °ALKPHOS 564*  °BILITOT 2.4*  ° °PT/INR °Recent Labs  °  02/15/21 °0505 02/16/21 °0448  °LABPROT 26.8* 24.3*  °INR 2.5* 2.2*  ° °Hepatitis Panel °No results for input(s): HEPBSAG, HCVAB, HEPAIGM, HEPBIGM in the last 72 hours. °C-Diff °No results for input(s): CDIFFTOX in the last 72 hours. ° °Studies/Results: °No results found. ° °Medications: I have reviewed the patient's current medications. °Scheduled: ° carbidopa-levodopa  1 tablet Oral 3 times per day  ° levothyroxine  75 mcg Oral Q0600  ° triamterene-hydrochlorothiazide  1 tablet Oral Daily  ° °Continuous: °PRN:acetaminophen, magnesium hydroxide, ondansetron **OR** ondansetron (ZOFRAN) IV, sodium chloride ° °Assessment/Plan: °89M w/hx of mechanical aortic valve on  warfarin, Parkinson's disease, prior prostate cancer p/w ab discomfort. Denies jaundice, vomiting, bleeding, dysphagia. The patient's last dose of warfarin was on 1/28. LFTs elevated upon arrival to AST 86, ALT 45, alk phos 564, T bili 2.4. INR today is 2.2. CT shows dilated intrahepatic bile ducts and ill defined mass extending from the region of the gallbladder fossa that is concerning for cholangiocarcinoma. PET scan shows large ill-defined infiltrating central liver mass that is most consistent with cholangiocarcinoma.  ° °- ERCP with Dr. Mansouraty once INR <2. NPO after MN in case procedure is offered tomorrow. Holding warfarin. ° ° LOS: 2 days  ° °David Benton C David Benton °02/16/2021, 1:08 PM ° °

## 2021-02-17 ENCOUNTER — Inpatient Hospital Stay (HOSPITAL_COMMUNITY): Payer: Medicare Other | Admitting: Anesthesiology

## 2021-02-17 ENCOUNTER — Encounter (HOSPITAL_COMMUNITY): Payer: Self-pay | Admitting: Surgery

## 2021-02-17 ENCOUNTER — Inpatient Hospital Stay (HOSPITAL_COMMUNITY): Payer: Medicare Other

## 2021-02-17 ENCOUNTER — Other Ambulatory Visit: Payer: Self-pay

## 2021-02-17 ENCOUNTER — Encounter (HOSPITAL_COMMUNITY)
Admission: AD | Disposition: A | Discharge status: Home / Self Care | Payer: Self-pay | Source: Ambulatory Visit | Attending: Surgery

## 2021-02-17 DIAGNOSIS — K209 Esophagitis, unspecified without bleeding: Secondary | ICD-10-CM

## 2021-02-17 DIAGNOSIS — K8050 Calculus of bile duct without cholangitis or cholecystitis without obstruction: Secondary | ICD-10-CM

## 2021-02-17 DIAGNOSIS — D490 Neoplasm of unspecified behavior of digestive system: Secondary | ICD-10-CM

## 2021-02-17 DIAGNOSIS — K222 Esophageal obstruction: Secondary | ICD-10-CM

## 2021-02-17 HISTORY — PX: BILIARY STENT PLACEMENT: SHX5538

## 2021-02-17 HISTORY — PX: BILIARY BRUSHING: SHX6843

## 2021-02-17 HISTORY — PX: BILIARY DILATION: SHX6850

## 2021-02-17 HISTORY — PX: SPHINCTEROTOMY: SHX5279

## 2021-02-17 HISTORY — PX: ERCP: SHX5425

## 2021-02-17 HISTORY — PX: PANCREATIC STENT PLACEMENT: SHX5539

## 2021-02-17 HISTORY — PX: ESOPHAGOGASTRODUODENOSCOPY (EGD) WITH PROPOFOL: SHX5813

## 2021-02-17 HISTORY — PX: REMOVAL OF STONES: SHX5545

## 2021-02-17 LAB — CBC
HCT: 41.4 % (ref 39.0–52.0)
Hemoglobin: 14.2 g/dL (ref 13.0–17.0)
MCH: 30.5 pg (ref 26.0–34.0)
MCHC: 34.3 g/dL (ref 30.0–36.0)
MCV: 88.8 fL (ref 80.0–100.0)
Platelets: 246 10*3/uL (ref 150–400)
RBC: 4.66 MIL/uL (ref 4.22–5.81)
RDW: 13.8 % (ref 11.5–15.5)
WBC: 4.3 10*3/uL (ref 4.0–10.5)
nRBC: 0 % (ref 0.0–0.2)

## 2021-02-17 LAB — COMPREHENSIVE METABOLIC PANEL
ALT: 14 U/L (ref 0–44)
AST: 124 U/L — ABNORMAL HIGH (ref 15–41)
Albumin: 3.8 g/dL (ref 3.5–5.0)
Alkaline Phosphatase: 802 U/L — ABNORMAL HIGH (ref 38–126)
Anion gap: 11 (ref 5–15)
BUN: 16 mg/dL (ref 8–23)
CO2: 28 mmol/L (ref 22–32)
Calcium: 9.7 mg/dL (ref 8.9–10.3)
Chloride: 98 mmol/L (ref 98–111)
Creatinine, Ser: 1.19 mg/dL (ref 0.61–1.24)
GFR, Estimated: 58 mL/min — ABNORMAL LOW (ref >60)
Glucose, Bld: 116 mg/dL — ABNORMAL HIGH (ref 70–99)
Potassium: 3.4 mmol/L — ABNORMAL LOW (ref 3.5–5.1)
Sodium: 137 mmol/L (ref 135–145)
Total Bilirubin: 3.3 mg/dL — ABNORMAL HIGH (ref 0.3–1.2)
Total Protein: 7.6 g/dL (ref 6.5–8.1)

## 2021-02-17 LAB — PROTIME-INR
INR: 1.8 — ABNORMAL HIGH (ref 0.8–1.2)
INR: 1.4 — ABNORMAL HIGH (ref 0.8–1.2)
Prothrombin Time: 20.4 seconds — ABNORMAL HIGH (ref 11.4–15.2)
Prothrombin Time: 16.6 seconds — ABNORMAL HIGH (ref 11.4–15.2)

## 2021-02-17 SURGERY — ERCP, WITH INTERVENTION IF INDICATED
Anesthesia: General

## 2021-02-17 MED ORDER — GLUCAGON HCL RDNA (DIAGNOSTIC) 1 MG IJ SOLR
INTRAMUSCULAR | Status: AC
  Filled 2021-02-17: qty 1

## 2021-02-17 MED ORDER — PHENYLEPHRINE 40 MCG/ML (10ML) SYRINGE FOR IV PUSH (FOR BLOOD PRESSURE SUPPORT)
PREFILLED_SYRINGE | INTRAVENOUS | Status: DC | PRN
  Administered 2021-02-17 (×2): 80 ug via INTRAVENOUS
  Administered 2021-02-17: 120 ug via INTRAVENOUS

## 2021-02-17 MED ORDER — PANTOPRAZOLE SODIUM 40 MG PO TBEC
40.0000 mg | DELAYED_RELEASE_TABLET | Freq: Every day | ORAL | Status: DC
  Administered 2021-02-17 – 2021-02-19 (×3): 40 mg via ORAL
  Filled 2021-02-17 (×3): qty 1

## 2021-02-17 MED ORDER — INDOMETHACIN 50 MG RE SUPP
RECTAL | Status: AC
  Filled 2021-02-17: qty 2

## 2021-02-17 MED ORDER — LIDOCAINE 2% (20 MG/ML) 5 ML SYRINGE
INTRAMUSCULAR | Status: DC | PRN
  Administered 2021-02-17: 60 mg via INTRAVENOUS

## 2021-02-17 MED ORDER — PROPOFOL 10 MG/ML IV BOLUS
INTRAVENOUS | Status: DC | PRN
  Administered 2021-02-17: 40 mg via INTRAVENOUS
  Administered 2021-02-17: 60 mg via INTRAVENOUS

## 2021-02-17 MED ORDER — CIPROFLOXACIN IN D5W 400 MG/200ML IV SOLN
INTRAVENOUS | Status: AC
  Filled 2021-02-17: qty 200

## 2021-02-17 MED ORDER — SODIUM CHLORIDE 0.9 % IV SOLN: INTRAVENOUS | Status: DC

## 2021-02-17 MED ORDER — ONDANSETRON HCL 4 MG/2ML IJ SOLN
INTRAMUSCULAR | Status: DC | PRN
  Administered 2021-02-17: 4 mg via INTRAVENOUS

## 2021-02-17 MED ORDER — SODIUM CHLORIDE 0.9 % IV SOLN
INTRAVENOUS | Status: DC | PRN
  Administered 2021-02-17: 30 mL

## 2021-02-17 MED ORDER — CIPROFLOXACIN IN D5W 400 MG/200ML IV SOLN
INTRAVENOUS | Status: DC | PRN
  Administered 2021-02-17: 400 mg via INTRAVENOUS

## 2021-02-17 MED ORDER — LACTATED RINGERS IV SOLN
INTRAVENOUS | Status: AC | PRN
  Administered 2021-02-17: 1000 mL via INTRAVENOUS

## 2021-02-17 MED ORDER — ROCURONIUM BROMIDE 10 MG/ML (PF) SYRINGE
PREFILLED_SYRINGE | INTRAVENOUS | Status: DC | PRN
  Administered 2021-02-17: 50 mg via INTRAVENOUS
  Administered 2021-02-17: 20 mg via INTRAVENOUS

## 2021-02-17 MED ORDER — SUGAMMADEX SODIUM 200 MG/2ML IV SOLN
INTRAVENOUS | Status: DC | PRN
  Administered 2021-02-17: 200 mg via INTRAVENOUS

## 2021-02-17 MED ORDER — PHENYLEPHRINE HCL-NACL 20-0.9 MG/250ML-% IV SOLN
INTRAVENOUS | Status: DC | PRN
  Administered 2021-02-17: 50 ug/min via INTRAVENOUS

## 2021-02-17 MED ORDER — INDOMETHACIN 50 MG RE SUPP
RECTAL | Status: DC | PRN
  Administered 2021-02-17: 100 mg via RECTAL

## 2021-02-17 MED ORDER — GLUCAGON HCL RDNA (DIAGNOSTIC) 1 MG IJ SOLR
INTRAMUSCULAR | Status: DC | PRN
  Administered 2021-02-17 (×4): .25 mg via INTRAVENOUS

## 2021-02-17 NOTE — Progress Notes (Signed)
Mobility Specialist Progress Note ° ° 02/17/21 1045  °Mobility  °Activity Ambulated independently in hallway  °Level of Assistance Independent  °Assistive Device None  °Distance Ambulated (ft) 1650 ft  °Activity Response Tolerated well  °$Mobility charge 1 Mobility  ° °Received pt in chair having no complaints and agreeable to mobility. Asymptomatic throughout ambulation, returned back to chair w/ call bell in reach and all needs met. ° °Jeremiah Hedrick °Mobility Specialist °Phone Number [phone] ° °

## 2021-02-17 NOTE — Interval H&P Note (Signed)
History and Physical Interval Note:  02/17/2021 1:12 PM  David Benton  has presented today for surgery, with the diagnosis of obstructive jaundice, cholangiocarcinoma.  The various methods of treatment have been discussed with the patient and family. After consideration of risks, benefits and other options for treatment, the patient has consented to  Procedure(s): ENDOSCOPIC RETROGRADE CHOLANGIOPANCREATOGRAPHY (ERCP) (N/A) as a surgical intervention.  The patient's history has been reviewed, patient examined, no change in status, stable for surgery.  I have reviewed the patient's chart and labs.  Questions were answered to the patient's satisfaction.    The risks of an EUS including intestinal perforation, bleeding, infection, aspiration, and medication effects were discussed as was the possibility it may not give a definitive diagnosis if a biopsy is performed.  When a biopsy of the pancreas is done as part of the EUS, there is an additional risk of pancreatitis at the rate of about 1-2%.  It was explained that procedure related pancreatitis is typically mild, although it can be severe and even life threatening, which is why we do not perform random pancreatic biopsies and only biopsy a lesion/area we feel is concerning enough to warrant the risk.  The risks of an ERCP were discussed at length, including but not limited to the risk of perforation, bleeding, abdominal pain, post-ERCP pancreatitis (while usually mild can be severe and even life threatening).  Possibility of trying to do both an ERCP and EUS pending how the time and procedure are going. Patient aware that no way to know if we can be successful without trying.  That will be goal.  May consider an attempt at EUS with biopsy, but majority of mass is on the right side.   [employer]

## 2021-02-17 NOTE — Progress Notes (Signed)
The proposed treatment discussed in conference is for discussion purpose only and is not a binding recommendation.  The patients have not been physically examined, or presented with their treatment options.  Therefore, final treatment plans cannot be decided.  

## 2021-02-17 NOTE — Progress Notes (Signed)
° ° °   °  Subjective: No acute changes. INR down to 1.8. Afebrile.   Objective: Vital signs in last 24 hours: Temp:  [97.9 F (36.6 C)-98 F (36.7 C)] 97.9 F (36.6 C) (01/31 2101) Pulse Rate:  [69] 69 (01/31 2101) Resp:  [17-18] 17 (01/31 2101) BP: (120-135)/(68-71) 120/71 (01/31 2101) SpO2:  [98 %-100 %] 98 % (01/31 2101) Last BM Date: 02/14/21  Intake/Output from previous day: 01/31 0701 - 02/01 0700 In: 880 [P.O.:880] Out: 2 [Urine:2] Intake/Output this shift: No intake/output data recorded.  PE: General: resting comfortably, NAD Neuro: alert and oriented, no focal deficits Resp: normal work of breathing on room air Abdomen: soft, nondistended, nontender to palpation.  Extremities: warm and well-perfused   Lab Results:  Recent Labs    02/14/21 1432 02/15/21 0505  WBC 4.7 4.1  HGB 13.2 12.6*  HCT 39.4 37.1*  PLT 260 212   BMET Recent Labs    02/14/21 1432 02/15/21 0505  NA 137 136  K 3.6 3.5  CL 97* 101  CO2 32 28  GLUCOSE 160* 107*  BUN 23 19  CREATININE 1.33* 1.05  CALCIUM 9.5 8.9   PT/INR Recent Labs    02/16/21 0448 02/17/21 0231  LABPROT 24.3* 20.4*  INR 2.2* 1.8*   CMP     Component Value Date/Time   NA 136 02/15/2021 0505   NA 142 07/08/2016 1449   K 3.5 02/15/2021 0505   CL 101 02/15/2021 0505   CO2 28 02/15/2021 0505   GLUCOSE 107 (H) 02/15/2021 0505   BUN 19 02/15/2021 0505   BUN 26 07/08/2016 1449   CREATININE 1.05 02/15/2021 0505   CREATININE 1.09 07/30/2014 1400   CALCIUM 8.9 02/15/2021 0505   PROT 6.4 (L) 02/15/2021 0505   PROT 6.6 07/08/2016 1449   ALBUMIN 3.2 (L) 02/15/2021 0505   ALBUMIN 4.5 07/08/2016 1449   AST 86 (H) 02/15/2021 0505   ALT 45 (H) 02/15/2021 0505   ALKPHOS 564 (H) 02/15/2021 0505   BILITOT 2.4 (H) 02/15/2021 0505   BILITOT 1.7 (H) 07/08/2016 1449   GFRNONAA >60 02/15/2021 0505   GFRAA >60 11/17/2016 1026   Lipase  No results found for: LIPASE     Studies/Results: No results  found.  Anti-infectives: Anti-infectives (From admission, onward)    None        Assessment/Plan 88 yo male with large intrahepatic mass, likely cholangiocarcinoma, with biliary obstruction. - GI consulted for ERCP, will possibly be done today as INR is <2 - NPO for procedure - Home meds as appropriate - VTE: SCDs, home Coumadin on hold for procedure - Dispo: inpatient, med-surg floor    LOS: 3 days    Michaelle Birks, MD [facility] Surgery General, Hepatobiliary and Pancreatic Surgery 02/17/21 8:01 AM

## 2021-02-17 NOTE — Anesthesia Preprocedure Evaluation (Signed)
Anesthesia Evaluation  Patient identified by MRN, date of birth, ID band Patient awake    Reviewed: Allergy & Precautions, NPO status , Patient's Chart, lab work & pertinent test results  Airway Mallampati: II  TM Distance: >3 FB Neck ROM: Full    Dental  (+) Teeth Intact, Dental Advisory Given   Pulmonary neg pulmonary ROS, former smoker,    Pulmonary exam normal breath sounds clear to auscultation       Cardiovascular hypertension, Pt. on medications +CHF (grade 1 diastolic dysfunction)  Normal cardiovascular exam+ dysrhythmias (off coumadin since 1/28) Atrial Fibrillation + Valvular Problems/Murmurs (s/p AVR 1991)  Rhythm:Regular Rate:Normal  Echo 2022: 1. Left ventricular ejection fraction, by estimation, is 55 to 60%. The  left ventricle has normal function. The left ventricle has no regional  wall motion abnormalities. There is moderate left ventricular hypertrophy.  Left ventricular diastolic  parameters are consistent with Grade I diastolic dysfunction (impaired  relaxation). Elevated left atrial pressure.  2. Right ventricular systolic function is normal. The right ventricular  size is normal. There is mildly elevated pulmonary artery systolic  pressure.  3. Left atrial size was mildly dilated.  4. The mitral valve is normal in structure. Mild mitral valve  regurgitation. No evidence of mitral stenosis.  5. The aortic valve has been repaired/replaced. Aortic valve  regurgitation is trivial. No aortic stenosis is present. There is a  tilting disk valve present in the aortic position. Procedure Date: 1991.  6. Aortic dilatation noted. There is mild dilatation of the aortic root,  measuring 43 mm. There is moderate dilatation of the ascending aorta,  measuring 45 mm.  7. The inferior vena cava is dilated in size with >50% respiratory  variability, suggesting right atrial pressure of 8 mmHg.   Stress test  2022:    Lexiscan stress is electrically nondiagnostic due to baseliine LBBB   Note frequent PVCs   Myoview scan shows normal perfusion, no sichemia or scar   Left ventricular function is normal at 54%   Prior study not available for comparison.   Overall low risk scan   Thoracic aortic aneurysm last CT scan 12/2020: Similar ascending aortic aneurysm, 4.5 cm. Ascending thoracic aortic aneurysm. Recommend semi-annual imaging followup by CTA or MRA and referral to cardiothoracic surgery if not already obtained. This recommendation follows 2010   Neuro/Psych parkinsonsnegative neurological ROS     GI/Hepatic Neg liver ROS, CT A/P  1. Ill-defined 4.9 cm hypodense mass which demonstrates delayed enhancement extending from the region of the gallbladder fossa and obstructs the biliary tree with intrahepatic biliary ductal dilation. Additionally, the inferior aspect of the mass abuts and invades the colonic wall at the hepatic flexure with obliteration of the gallbladder fossa. Findings are most consistent with cholangiocarcinoma. 2. Smaller hypodense segment III hepatic lesion which also demonstrates delayed enhancement, concerning for metastasis. 3. Pleural-based 14 mm right lower lobe pulmonary nodule is similar in size to most recent prior, possibly reflecting a metastatic lesion or primary bronchogenic tumor. 4. No pathologically enlarged abdominal lymph nodes. 5.  Aortic Atherosclerosis (ICD10-I70.0).   Endo/Other  Hypothyroidism   Renal/GU negative Renal ROS  negative genitourinary   Musculoskeletal negative musculoskeletal ROS (+)   Abdominal   Peds negative pediatric ROS (+)  Hematology negative hematology ROS (+) hct 41   Anesthesia Other Findings   Reproductive/Obstetrics negative OB ROS  Anesthesia Physical Anesthesia Plan  ASA: 3  Anesthesia Plan: General   Post-op Pain Management: Tylenol PO  (pre-op)   Induction: Intravenous  PONV Risk Score and Plan: 2 and Ondansetron, Dexamethasone and Treatment may vary due to age or medical condition  Airway Management Planned: Oral ETT  Additional Equipment: None  Intra-op Plan:   Post-operative Plan: Extubation in OR  Informed Consent: I have reviewed the patients History and Physical, chart, labs and discussed the procedure including the risks, benefits and alternatives for the proposed anesthesia with the patient or authorized representative who has indicated his/her understanding and acceptance.     Dental advisory given  Plan Discussed with: CRNA  Anesthesia Plan Comments:         Anesthesia Quick Evaluation

## 2021-02-17 NOTE — Op Note (Signed)
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 Barbaraann Faster Henrene Pastor, MD, Sonny Masters                            "Lyndee Leo" [place] Medicines:                General Anesthesia, Cipro 400 mg IV, Indomethacin                            100 mg PR, Glucagon 1 mg IV Complications:            No immediate complications. Estimated Blood Loss:     Estimated blood loss was minimal. Procedure:                Pre-Anesthesia Assessment:                           - Prior to the procedure, a History and Physical                            was performed, and patient medications and                            allergies were reviewed. The patient's tolerance of  previous anesthesia was also reviewed. The risks                            and benefits of the procedure and the sedation                            options and risks were discussed with the patient.                            All questions were answered, and informed consent                            was obtained. Prior Anticoagulants: The patient has                             taken Coumadin (warfarin), last dose was 5 days                            prior to procedure. ASA Grade Assessment: III - A                            patient with severe systemic disease. After                            reviewing the risks and benefits, the patient was                            deemed in satisfactory condition to undergo the                            procedure.                           After obtaining informed consent, the scope was                            passed under direct vision. Throughout the                            procedure, the patient's blood pressure, pulse, and                            oxygen saturations were monitored continuously. The                            [id number]-H190 ([id number]) Olympus endoscope was introduced                            through the mouth, and used to inject contrast into                            and used to locate the major papilla. The [id number]-Q190V                            (  [id number]) Olympus duodenoscope was introduced                            through the mouth, and used to inject contrast into                            and used to inject contrast into the bile duct and                            ventral pancreatic duct. The ERCP was technically                            difficult and complex due to difficulty passing                            guidewires through biliary ductal stenosis.                            Successful completion of the procedure was aided by                            performing the maneuvers documented (below) in this                            report. The patient tolerated the procedure. Scope In: Scope Out: Findings:      The scout film was normal.      A standard esophagogastroduodenoscopy scope was used for the examination       of the upper gastrointestinal tract. The scope was passed under direct       vision through the upper GI tract. No gross lesions were noted in the       proximal  esophagus and in the mid esophagus. LA Grade B (one or more       mucosal breaks greater than 5 mm, not extending between the tops of two       mucosal folds) esophagitis with no bleeding was found in the distal       esophagus. A non-obstructing Schatzki ring was found at the       gastroesophageal junction. A J-shaped deformity was found of the entire       examined stomach. No gross mucosal lesions were noted in the entire       examined stomach however. No gross lesions were noted in the duodenal       bulb, in the first portion of the duodenum and in the second portion of       the duodenum. The major papilla was normal.      On the first attempt at wire-guided biliary cannulation, I was not       successful. This led to placement of the wire within the pancreatic       duct. Decision was made to pursue a double-wire approach. The short       0.035 inch Soft Jagwire was left within the pancreatic duct. On the next       attempt with the new wire, a short 0.035 inch Soft Jagwire was passed       into the biliary tree and placed within  the right hepatic tree. The       Hydratome sphincterotome was passed over the guidewire and the bile duct       was then deeply cannulated. Contrast was injected. I personally       interpreted the bile duct images. Ductal flow of contrast was adequate.       Image quality was adequate. Contrast extended to the bifurcation.       Opacification of the entire biliary tree except for the gallbladder was       successful. The maximum diameter of the main bile duct was 6 mm. The       common hepatic duct and the left hepatic duct and the right hepatic duct       separately (Bismuth II) contained multiple severe stenoses 8-10 mm in       length. The left intrahepatic branches were moderately dilated. The       largest diameter was 10 mm of this region. An 8 mm biliary       sphincterotomy was made with a monofilament Hydratome sphincterotome       using ERBE  electrocautery. There was self limited oozing from the       sphincterotomy which did not require treatment.      Because I knew I wanted to place a second wire in the biliary system, I       decided to place one 4 Fr by 5 cm temporary plastic pancreatic stent       with a single external pigtail was placed into the ventral pancreatic       duct. The stent was in good position to decrease PEP.      A short 0.035 inch Soft Jagwire was passed into the left biliary tree.      To discover objects, the biliary tree was swept with a retrieval       balloon. Sludge was swept from the duct.      An occlusion cholangiogram was performed that showed no further       significant biliary pathology other than what had been noted above in       regards to stricturing.      The strictures in the CHD, the LHD-CHD and the RHD-CHD were successfully       dilated with a Hurricane 4 mm balloon dilator. Cells for cytology were       obtained by brushing each of the strictures noted. One 7 Fr by 12 cm       transpapillary plastic biliary stent with a single external flap and a       single internal flap was placed into the left hepatic duct first. Bile       flowed through the stent. The stent was in good position. One 7 Fr by 12       cm transpapillary plastic biliary stent with a single external flap and       a single internal flap was placed into the right hepatic duct. Bile       flowed through the stent. The stent was in good position.      A pancreatogram was not performed.      The duodenoscope was withdrawn from the patient. Impression:               - No gross lesions in esophagus proximally. LA  Grade B esophagitis with no bleeding - distally.                            Non-obstructing Schatzki ring.                           - J-shaped deformity in the entire stomach. No                            gross mucosal lesions in the stomach.                           - No gross  lesions in the duodenal bulb, in the                            first portion of the duodenum and in the second                            portion of the duodenum.                           - The major papilla appeared normal.                           - Multiple severe biliary strictures were found in                            the common hepatic duct and hepatic duct system                            (Bismuth II). The strictures were malignant                            appearing.                           - The left intrahepatic branches were moderately                            dilated - likely from stricturing disease.                           - A biliary sphincterotomy was performed - mild                            ooze noted but self-limited.                           - Double wire technique performed to aid in biliary                            cannulation.                           - The biliary tree was swept and sludge was  found.                           - The CHD, RHD-CHD, LHD-CHD strictures were                            dilated. These were brushed for cytology.                           - One plastic biliary stent was placed into the                            left hepatic duct.                           - One plastic biliary stent was placed into the                            right hepatic duct.                           - One temporary plastic pancreatic stent was placed                            into the ventral pancreatic duct to decrease PEP. Recommendation:           - The patient will be observed post-procedure,                            until all discharge criteria are met.                           - Return patient to hospital ward for ongoing care.                           - Advance diet as tolerated.                           - Check liver enzymes (AST, ALT, alkaline                            phosphatase, bilirubin) in the morning.                            - Observe patient's clinical course.                           - Start Pantoprazole 40 mg daily.                           - If anticoagulation is needed may start IV heparin                            in 6-hours without bolus. Otherwise can restart  Warfarin within next 1-2 days if no other                            procedures are needed.                           - Will need to ask our IR colleagues to consider in                            the next 1-2 days, a liver ultrasound guided biopsy                            for attempt at getting further tissue if our                            brushings do not have enough tissue for                            foundational testing.                           - Case discussed at [facility], and there may be role for                            a colonoscopy to be performed if it is necessary to                            understand overt invasion into the right colon of                            this mass, as it could determine needs for other                            radiation or other therapies (since he is a                            non-surgical candidate).                           - If LFTs do not improve in the coming days with                            stents in place, would recommend updated CT imaging                            to ensure that the stents are patent and in place.                            He could require PBD placement in the future if                            that is the case.                           -  Patient will need a KUB 2-view in 10-14 days to                            ensure pancreatic stent has migrated successfully.                            If still present at that time will need to be                            scheduled for EGD with stent pull.                           - Repeat ERCP in 4 months to exchange stent.                           - The findings and recommendations  were discussed                            with the patient.                           - The findings and recommendations were discussed                            with the patient's family.                           - The findings and recommendations were discussed                            with the referring physician. Procedure Code(s):        --- Professional ---                           [phone], Endoscopic retrograde                            cholangiopancreatography (ERCP); with placement of                            endoscopic stent into biliary or pancreatic duct,                            including pre- and post-dilation and guide wire                            passage, when performed, including sphincterotomy,                            when performed, each stent                           60737, 20, Endoscopic retrograde  cholangiopancreatography (ERCP); with placement of                            endoscopic stent into biliary or pancreatic duct,                            including pre- and post-dilation and guide wire                            passage, when performed, including sphincterotomy,                            when performed, each stent                           43274, 65, Endoscopic retrograde                            cholangiopancreatography (ERCP); with placement of                            endoscopic stent into biliary or pancreatic duct,                            including pre- and post-dilation and guide wire                            passage, when performed, including sphincterotomy,                            when performed, each stent                           43264, Endoscopic retrograde                            cholangiopancreatography (ERCP); with removal of                            calculi/debris from biliary/pancreatic duct(s) Diagnosis Code(s):        --- Professional ---                           K20.90,  Esophagitis, unspecified without bleeding                           K22.2, Esophageal obstruction                           K31.89, Other diseases of stomach and duodenum                           K83.1, Obstruction of bile duct                           D49.0, Neoplasm of unspecified behavior of  digestive system                           R17, Unspecified jaundice                           R94.5, Abnormal results of liver function studies                           K83.8, Other specified diseases of biliary tract CPT copyright 2019 [employer]. All rights reserved. The codes documented in this report are preliminary and upon coder review may  be revised to meet current compliance requirements. Justice Britain, MD 02/17/2021 3:43:16 PM Number of Addenda: 0

## 2021-02-17 NOTE — Interval H&P Note (Signed)
History and Physical Interval Note:  02/17/2021 1:07 PM  David Benton  has presented today for surgery, with the diagnosis of obstructive jaundice, cholangiocarcinoma.  The various methods of treatment have been discussed with the patient and family. After consideration of risks, benefits and other options for treatment, the patient has consented to  Procedure(s): ENDOSCOPIC RETROGRADE CHOLANGIOPANCREATOGRAPHY (ERCP) (N/A) as a surgical intervention.  The patient's history has been reviewed, patient examined, no change in status, stable for surgery.  I have reviewed the patient's chart and labs.  Questions were answered to the patient's satisfaction.    The risks of an ERCP were discussed at length, including but not limited to the risk of perforation, bleeding, abdominal pain, post-ERCP pancreatitis (while usually mild can be severe and even life threatening).   [employer]

## 2021-02-17 NOTE — Anesthesia Postprocedure Evaluation (Signed)
Anesthesia Post Note  Patient: David Benton  Procedure(s) Performed: ENDOSCOPIC RETROGRADE CHOLANGIOPANCREATOGRAPHY (ERCP) BILIARY BRUSHING SPHINCTEROTOMY BILIARY STENT PLACEMENT PANCREATIC STENT PLACEMENT BILIARY DILATION REMOVAL OF SLUDGE     Patient location during evaluation: PACU Anesthesia Type: General Level of consciousness: awake and alert, oriented and patient cooperative Pain management: pain level controlled Vital Signs Assessment: post-procedure vital signs reviewed and stable Respiratory status: spontaneous breathing, nonlabored ventilation and respiratory function stable Cardiovascular status: blood pressure returned to baseline and stable Postop Assessment: no apparent nausea or vomiting Anesthetic complications: no   No notable events documented.  Last Vitals:  Vitals:   02/17/21 1221 02/17/21 1523  BP: (!) 166/61 (!) 182/64  Pulse: 71 (!) 53  Resp: 19 20  Temp:    SpO2: 100% 100%    Last Pain:  Vitals:   02/17/21 1523  TempSrc: Temporal  PainSc: [name]

## 2021-02-17 NOTE — Care Management Important Message (Signed)
Important Message  Patient Details  Name: David Benton MRN: [id number] Date of Birth: [birth year]   Medicare Important Message Given:  Yes     Hannah Beat 02/17/2021, 1:02 PM

## 2021-02-17 NOTE — Transfer of Care (Signed)
Immediate Anesthesia Transfer of Care Note  Patient: David Benton  Procedure(s) Performed: ENDOSCOPIC RETROGRADE CHOLANGIOPANCREATOGRAPHY (ERCP)  Patient Location: PACU  Anesthesia Type:General  Level of Consciousness: drowsy and patient cooperative  Airway & Oxygen Therapy: Patient Spontanous Breathing  Post-op Assessment: Report given to RN and Post -op Vital signs reviewed and stable  Post vital signs: Reviewed and stable  Last Vitals:  Vitals Value Taken Time  BP 182/64 02/17/21 1523  Temp    Pulse 53 02/17/21 1523  Resp 20 02/17/21 1523  SpO2 100 % 02/17/21 1523    Last Pain:  Vitals:   02/17/21 1523  TempSrc: Temporal  PainSc: Asleep      Patients Stated Pain Goal: 0 (79/03/83 3383)  Complications: No notable events documented.

## 2021-02-17 NOTE — Anesthesia Procedure Notes (Signed)
Procedure Name: Intubation Date/Time: 02/17/2021 1:35 PM Performed by: Georgia Duff, CRNA Pre-anesthesia Checklist: Patient identified, Emergency Drugs available, Suction available and Patient being monitored Patient Re-evaluated:Patient Re-evaluated prior to induction Oxygen Delivery Method: Circle System Utilized Preoxygenation: Pre-oxygenation with 100% oxygen Induction Type: IV induction Ventilation: Mask ventilation without difficulty Laryngoscope Size: Miller and 2 Grade View: Grade I Tube type: Oral Tube size: 7.5 mm Number of attempts: 1 Airway Equipment and Method: Stylet and Oral airway Placement Confirmation: ETT inserted through vocal cords under direct vision, positive ETCO2 and breath sounds checked- equal and bilateral Secured at: 22 cm Tube secured with: Tape Dental Injury: Teeth and Oropharynx as per pre-operative assessment

## 2021-02-18 ENCOUNTER — Telehealth: Payer: Self-pay

## 2021-02-18 DIAGNOSIS — R160 Hepatomegaly, not elsewhere classified: Secondary | ICD-10-CM

## 2021-02-18 LAB — COMPREHENSIVE METABOLIC PANEL
ALT: 37 U/L (ref 0–44)
AST: 126 U/L — ABNORMAL HIGH (ref 15–41)
Albumin: 2.7 g/dL — ABNORMAL LOW (ref 3.5–5.0)
Alkaline Phosphatase: 558 U/L — ABNORMAL HIGH (ref 38–126)
Anion gap: 9 (ref 5–15)
BUN: 22 mg/dL (ref 8–23)
CO2: 25 mmol/L (ref 22–32)
Calcium: 8.1 mg/dL — ABNORMAL LOW (ref 8.9–10.3)
Chloride: 97 mmol/L — ABNORMAL LOW (ref 98–111)
Creatinine, Ser: 1.08 mg/dL (ref 0.61–1.24)
GFR, Estimated: >60 mL/min (ref >60)
Glucose, Bld: 129 mg/dL — ABNORMAL HIGH (ref 70–99)
Potassium: 2.9 mmol/L — ABNORMAL LOW (ref 3.5–5.1)
Sodium: 131 mmol/L — ABNORMAL LOW (ref 135–145)
Total Bilirubin: 3.3 mg/dL — ABNORMAL HIGH (ref 0.3–1.2)
Total Protein: 5.4 g/dL — ABNORMAL LOW (ref 6.5–8.1)

## 2021-02-18 LAB — CBC
HCT: 32.5 % — ABNORMAL LOW (ref 39.0–52.0)
Hemoglobin: 10.9 g/dL — ABNORMAL LOW (ref 13.0–17.0)
MCH: 29.5 pg (ref 26.0–34.0)
MCHC: 33.5 g/dL (ref 30.0–36.0)
MCV: 88.1 fL (ref 80.0–100.0)
Platelets: 193 10*3/uL (ref 150–400)
RBC: 3.69 MIL/uL — ABNORMAL LOW (ref 4.22–5.81)
RDW: 13.4 % (ref 11.5–15.5)
WBC: 4.7 10*3/uL (ref 4.0–10.5)
nRBC: 0.4 % — ABNORMAL HIGH (ref 0.0–0.2)

## 2021-02-18 LAB — PROTIME-INR
INR: 1.5 — ABNORMAL HIGH (ref 0.8–1.2)
Prothrombin Time: 17.9 seconds — ABNORMAL HIGH (ref 11.4–15.2)

## 2021-02-18 LAB — CYTOLOGY - NON PAP

## 2021-02-18 LAB — BILIRUBIN, DIRECT: Bilirubin, Direct: 1.3 mg/dL — ABNORMAL HIGH (ref 0.0–0.2)

## 2021-02-18 MED ORDER — PANTOPRAZOLE SODIUM 40 MG PO TBEC: 40.0000 mg | DELAYED_RELEASE_TABLET | Freq: Every day | ORAL | 5 refills | Status: AC

## 2021-02-18 MED ORDER — POTASSIUM CHLORIDE 10 MEQ/100ML IV SOLN
10.0000 meq | INTRAVENOUS | Status: AC
  Administered 2021-02-18 (×4): 10 meq via INTRAVENOUS
  Filled 2021-02-18 (×4): qty 100

## 2021-02-18 MED ORDER — POTASSIUM CHLORIDE CRYS ER 20 MEQ PO TBCR
40.0000 meq | EXTENDED_RELEASE_TABLET | Freq: Two times a day (BID) | ORAL | Status: AC
  Administered 2021-02-18 (×2): 40 meq via ORAL
  Filled 2021-02-18 (×2): qty 2

## 2021-02-18 NOTE — Progress Notes (Signed)
Brief oncology note:  Cytology from ERCP performed 02/17/2021 was nondiagnostic.  Recommend proceeding with ultrasound-guided liver biopsy as soon as possible to confirm diagnosis.  I explained the need for a liver biopsy to the patient and his wife.  They state understanding of this.  I further explained that we will hold off on resuming Coumadin until after the liver biopsy is completed.  Order has been placed for ultrasound-guided liver biopsy by IR, hopefully tomorrow.  Scheduling message has been sent to the cancer center schedulers to arrange for outpatient follow-up in about 7 to 10 days.  Mikey Bussing, DNP, AGPCNP-BC, AOCNP

## 2021-02-18 NOTE — Consult Note (Signed)
Chief Complaint: Patient was seen in consultation today for image guided liver mass biopsy  Referring Physician(s): Feng,Y  Supervising Physician: Mir, Sharen Heck  Patient Status: [facility] - In-pt  History of Present Illness: David Benton is an 88 y.o. male ,remote smoker, with past medical history significant for bicuspid aortic valve on Coumadin with mechanical AVR 1991, ascending aortic aneurysm, hypertension, Parkinson's disease, prostate cancer, carotid artery stenosis, left bundle branch block, Mnire's disease, colonic polyps.  He has been followed by CT surgery for lung nodule as well as the ascending aortic aneurysm.  In December 2022 patient underwent CT chest which revealed:  .1. The right lower lobe pulmonary nodule has enlarged, suspicious for primary bronchogenic carcinoma. 2. Abnormal appearance of the liver/biliary tree, incompletely and suboptimally evaluated. Intrahepatic and possible extrahepatic biliary duct dilatation, with central hepatic hypoattenuation. Findings are suspicious for either a cholangiocarcinoma or distal common duct obstruction. Recommend dedicated pre and post contrast abdominal CT or MRI. Given patient age, CT would likely be more practical. 3. No thoracic adenopathy. 4. Similar ascending aortic aneurysm, 4.5 cm. Ascending thoracic aortic aneurysm. Recommend semi-annual imaging followup by CTA or MRA and referral to cardiothoracic surgery if not already obtained. This recommendation follows 2010 ACCF/AHA/AATS/ACR/ASA/SCA/SCAI/SIR/STS/SVM Guidelines for the Diagnosis and Management of Patients With Thoracic Aortic Disease. Circulation. 2010; 121: T903-E092. Aortic aneurysm NOS (ICD10-I71.9) 5. Aortic atherosclerosis (ICD10-I70.0), coronary artery atherosclerosis and emphysema (ZRA07-M22.9)  CT abdomen on 01/28/21 revealed:   1. Ill-defined 4.9 cm hypodense mass which demonstrates delayed enhancement extending from the region of the  gallbladder fossa and obstructs the biliary tree with intrahepatic biliary ductal dilation. Additionally, the inferior aspect of the mass abuts and invades the colonic wall at the hepatic flexure with obliteration of the gallbladder fossa. Findings are most consistent with cholangiocarcinoma. 2. Smaller hypodense segment III hepatic lesion which also demonstrates delayed enhancement, concerning for metastasis. 3. Pleural-based 14 mm right lower lobe pulmonary nodule is similar in size to most recent prior, possibly reflecting a metastatic lesion or primary bronchogenic tumor. 4. No pathologically enlarged abdominal lymph nodes. 5.  Aortic Atherosclerosis   PET scan 02/03/2021 revealed:  1. Large ill-defined infiltrating central liver mass is hypermetabolic and consistent with cholangiocarcinoma. Hypermetabolic metastatic focus in the left hepatic lobe. Celiac axis adenopathy. 2. The peripheral wedge-shaped right lower lobe pulmonary lesion is weakly hypermetabolic and more likely rounded atelectasis and less likely neoplastic  He was direct admitted for ERCP on 2/1 which revealed:  - No gross lesions in esophagus proximally. LA Grade B esophagitis with no bleeding - distally. Non-obstructing Schatzki ring. - J-shaped deformity in the entire stomach. No gross mucosal lesions in the stomach. - No gross lesions in the duodenal bulb, in the first portion of the duodenum and in the second portion of the duodenum. - The major papilla appeared normal. - Multiple severe biliary strictures were found in the common hepatic duct and hepatic duct system (Bismuth II). The strictures were malignant appearing. The CHD, RHD-CHD, LHD-CHD strictures were dilated. These were brushed for cytology. - One plastic biliary stent was placed into the left hepatic duct. - One plastic biliary stent was placed into the right hepatic duct. - One temporary plastic pancreatic stent was placed into the ventral  pancreatic duct to decrease PEP.   Cytology from above brushings revealed atypical cells but were not diagnostic of malignancy.  Request now received from oncology for image guided liver mass biopsy for further evaluation.   Past Medical History:  Diagnosis Date  Bicuspid AS s/p mechanical AVR in 1991    Echocardiogram 10/22: EF 55-60, no RWMA, mod LVH, Gr 1 DD, normal RVSF, mild LAE, mild MR, AVR functioning normally with trivial AI, aortic root 43 mm, ascending aorta 45 mm, RVSP 40.7 mmHg   Bilateral carotid artery stenosis 07/10/2016   Carotid US 10/2018: Bilateral ICA 1-39; right subclavian 1.7 cm (no change since 03/2017) // Carotid US 10/21: Bilat ICA 1-39//Bilateral ICA 10/22: R 1-39; L 1-39   External hemorrhoids without mention of complication    Heart valve replaced by other means    LBBB (left bundle branch block)    Myoview 11/22: EF 54, PVCs, no ischemia or scar; low risk   Long term (current) use of anticoagulants 09/15/2010   Meniere's disease    Other and unspecified hyperlipidemia    Other constipation    Parkinson disease ([place]) 02/2016   no meds, sees [name]    Personal history of colonic polyps    Personal history of malignant neoplasm of prostate    Right inguinal hernia 11/18/2016   Shingles    SHINGLES 11/26/2009   Thoracic aneurysm without mention of rupture    followed by [name] with yearly CT scans    Unspecified essential hypertension     Past Surgical History:  Procedure Laterality Date   AORTIC VALVE REPLACEMENT  1991    st. jude's valve, per [name]    CATARACT EXTRACTION Left 12/2018   COLONOSCOPY  06-28-10   per Dr. Henrene Pastor, benign polyps, no further scopes needed    [place] Right 11/18/2016   Procedure: OPEN REPAIR RIGHT INGUINAL HERNIA WITH MESH;  Surgeon: Fanny Skates, MD;  Location: [place];  Service: General;  Laterality: Right;   INSERTION OF MESH Right 11/18/2016    Procedure: INSERTION OF MESH;  Surgeon: Fanny Skates, MD;  Location: [place];  Service: General;  Laterality: Right;   PROSTATECTOMY  1995   per Dr. Roni Bread    TONSILLECTOMY      Allergies: Codeine  Medications: Prior to Admission medications   Medication Sig Start Date End Date Taking? Authorizing Provider  ACETAMINOPHEN PO Take 650 mg by mouth every 6 (six) hours as needed for pain.    Yes [provider]  amoxicillin (AMOXIL) 500 MG capsule Take 4 capsules (2,000 mg) one hour prior to all dental visits. 05/17/19  Yes Weaver, Scott T, PA-C  atorvastatin (LIPITOR) 40 MG tablet Take 40 mg by mouth daily. 01/12/21  Yes [provider]  carbidopa-levodopa (SINEMET IR) 25-100 MG tablet TAKE 1 TABLET BY MOUTH 3 TIMES DAILY Patient taking differently: Take 1 tablet by mouth 3 (three) times daily. 7AM/11AM/4PM 09/16/20  Yes Tat, Eustace Quail, DO  levothyroxine (SYNTHROID) 75 MCG tablet Take 75 mcg by mouth daily. 01/19/21  Yes [provider]  magnesium hydroxide (MILK OF MAGNESIA) 400 MG/5ML suspension Take 30 mLs by mouth daily. Reported on 04/21/2015   Yes [provider]  Probiotic Product (PROBIOTIC-10 ULTIMATE) CAPS Take 1 capsule by mouth daily.   Yes [provider]  saline (AYR) GEL Place 1 application into the nose in the morning and at bedtime.    Yes [provider]  sodium chloride (OCEAN) 0.65 % SOLN nasal spray Place 1 spray into both nostrils at bedtime as needed for congestion. Reported on 04/21/2015   Yes [provider]  triamterene-hydrochlorothiazide (MAXZIDE-25) 37.5-25 MG tablet TAKE 1 TABLET BY  MOUTH EVERY MORNING Patient taking differently: Take 1 tablet by mouth daily. 02/12/21  Yes Sherren Mocha, MD  warfarin (COUMADIN) 5 MG tablet TAKE AS DIRECTED BY COUMADIN CLINIC Patient taking differently: Take 5-7.5 mg by mouth See admin instructions. Take 1 tablet on Mondays ONLY and 1 and half tablet on other  days. 12/14/20  Yes Sherren Mocha, MD  atorvastatin (LIPITOR) 40 MG tablet TAKE 1 TABLET BY MOUTH EVERY DAY Patient not taking: Reported on 02/14/2021 01/12/21   Sherren Mocha, MD  levothyroxine (SYNTHROID) 75 MCG tablet TAKE 1 TABLET BY MOUTH EVERY DAY Patient not taking: Reported on 02/14/2021 01/19/21   [name], MD  pantoprazole (PROTONIX) 40 MG tablet Take 1 tablet (40 mg total) by mouth daily. 02/18/21   Dwan Bolt, MD     Family History  Problem Relation Age of Onset   Heart attack Father 15       fatal at 75   Hypertension Father    CAD Father    Other Father        possible PD as says had "palsy"   Prostate cancer Brother    Diabetes Brother    Heart attack Mother    Diabetes Brother    Stroke Brother    Heart disease Brother        carotid artery disease   Hypertension Brother    Breast cancer Sister        mastectomy at 66   Colon cancer Neg Hx     Social History   Socioeconomic History   Marital status: Married    Spouse name: Not on file   Number of children: 0   Years of education: 13   Highest education level: Not on file  Occupational History   Occupation: retired    Comment: [employer]  Tobacco Use   Smoking status: Former    Packs/day: 0.50    Years: 10.00    Pack years: 5.00    Types: Cigarettes    Quit date: 02/06/1972    Years since quitting: 49.0   Smokeless tobacco: Never  Vaping Use   Vaping Use: Never used  Substance and Sexual Activity   Alcohol use: Yes    Alcohol/week: 7.0 standard drinks    Types: 7 Standard drinks or equivalent per week    Comment: glass of wine daily; just about    Drug use: No   Sexual activity: Never  Other Topics Concern   Not on file  Social History Narrative   HSG, a couple of years of college. Married '78 - . No children. Work - [employer] 35 years - retired.   Lives alone with wife and two dogs. ACP - Yes CPR, Yes - short-term mechanical ventilation. Does not want prolonged  heroic measures in the face of irreversible disease. HCPOA - wife. Alternative is his brother: Tarik Teixeira [facility].       Right handed   Two story home   Drinks caffeine   Social Determinants of Health   Financial Resource Strain: Not on file  Food Insecurity: No Food Insecurity   Worried About Charity fundraiser in the Last Year: Never true   Ran Out of Food in the Last Year: Never true  Transportation Needs: Not on file  Physical Activity: Sufficiently Active   Days of Exercise per Week: 4 days   Minutes of Exercise per Session: 60 min  Stress: No Stress Concern Present  Feeling of Stress : Not at all  Social Connections: Socially Integrated   Frequency of Communication with Friends and Family: Three times a week   Frequency of Social Gatherings with Friends and Family: Three times a week   Attends Religious Services: More than 4 times per year   Active Member of Clubs or Organizations: Yes   Attends Archivist Meetings: More than 4 times per year   Marital Status: Married      Review of Systems currently denies fever, headache, chest pain, dyspnea, cough, worsening abdominal pain, back pain, nausea, vomiting or bleeding.  Does report some weight loss and decreased appetite  Vital Signs: BP (!) 127/54 (BP Location: Right Arm)    Pulse 60    Temp (!) 97.5 F (36.4 C) (Oral)    Resp 18    Ht 5\' 10"  (1.778 m)    Wt 137 lb (62.1 kg)    SpO2 100%    BMI 19.66 kg/m   Physical Exam awake, alert.  Chest clear to auscultation bilaterally.  Heart with regular rate and rhythm, positive click, mechanical murmur.  Abdomen soft, positive bowel sounds, nontender.  No lower extremity edema.  Imaging: NM PET Image Initial (PI) Skull Base To Thigh  Result Date: 02/03/2021 CLINICAL DATA:  Initial treatment strategy for right lower lobe pulmonary nodule and hepatic lesions. EXAM: NUCLEAR MEDICINE PET SKULL BASE TO THIGH TECHNIQUE: 6.8 mCi F-18 FDG was injected  intravenously. Full-ring PET imaging was performed from the skull base to thigh after the radiotracer. CT data was obtained and used for attenuation correction and anatomic localization. Fasting blood glucose: 105 mg/dl COMPARISON:  CT chest 01/01/2021 and CT abdomen 01/28/2021 FINDINGS: Mediastinal blood pool activity: SUV max 2.43 Liver activity: SUV max NA NECK: Choose 1 Incidental CT findings: none CHEST: The wedge-shaped subpleural density in the medial right lower lobe is weakly hypermetabolic with SUV max of 9.32. This is un likely a primary lung neoplasm and more likely rounded atelectasis. No other worrisome pulmonary lesions are identified. A few tiny subpleural pulmonary nodules are stable. No enlarged or hypermetabolic mediastinal or hilar lymph nodes. No supraclavicular or axillary adenopathy. Incidental CT findings: Stable advanced vascular disease. ABDOMEN/PELVIS: Large ill-defined infiltrating mass in the central liver is hypermetabolic with SUV max of 6.71. Persistent biliary dilatation. Findings consistent with cholangiocarcinoma. Second smaller lesion in the left hepatic lobe has an SUV max of 3.60 and is likely a metastatic focus. There is also metastatic celiac axis lymphadenopathy with SUV max of 3.60. No retroperitoneal lymphadenopathy. Incidental CT findings: Advanced atherosclerotic calcifications involving the aorta, iliac arteries and branch vessels but no focal aneurysm. Advanced colonic diverticulosis. Status post prostatectomy. SKELETON: No findings to suggest osseous metastatic disease. Incidental CT findings: none IMPRESSION: 1. Large ill-defined infiltrating central liver mass is hypermetabolic and consistent with cholangiocarcinoma. Hypermetabolic metastatic focus in the left hepatic lobe. Celiac axis adenopathy. 2. The peripheral wedge-shaped right lower lobe pulmonary lesion is weakly hypermetabolic and more likely rounded atelectasis and less likely neoplastic. Recommend  continued surveillance. Electronically Signed   By: Marijo Sanes M.D.   On: 02/03/2021 16:41   CT ABDOMEN W WO CONTRAST  Result Date: 01/28/2021 CLINICAL DATA:  cholangiocarcinoma. EXAM: CT ABDOMEN WITHOUT AND WITH CONTRAST TECHNIQUE: Multidetector CT imaging of the abdomen was performed following the standard protocol before and following the bolus administration of intravenous contrast. RADIATION DOSE REDUCTION: This exam was performed according to the departmental dose-optimization program which includes automated exposure control, adjustment of  the mA and/or kV according to patient size and/or use of iterative reconstruction technique. Creatinine was obtained on site at [place] at [address] Results: Creatinine 1.2 mg/dL. CONTRAST:  46mL ISOVUE-370 IOPAMIDOL (ISOVUE-370) INJECTION 76% COMPARISON:  Chest CT January 01, 2021 FINDINGS: Lower chest: Pleural-based right lower lobe pulmonary nodule is similar in size measuring 14 x 10 mm previously 15 x 12 mm. Hepatobiliary: Within the central liver extending from the region of the gallbladder fossa there is an ill-defined 4.6 x 4.6 x 4.9 cm intrinsically hypodense mass which demonstrates delayed enhancement and obstructs the biliary tree with intrahepatic biliary ductal dilation. This distorts the hepatic hilar vasculature without invasion of the portal vein. There is cystic appearing structures located along both the medial and lateral aspects of the mass, with the lateral cystic structure measuring 2 cm on image 24/2 in medial cystic areas measuring 3 cm on image 22/2 with this medial structure appearing to possibly communicate with the common bile duct. Additionally, the inferior aspect of the mass abuts and invades the colonic wall at the hepatic flexure on coronal image 39/17 and sagittal image 50/8. Smaller hypodense segment III hepatic lesion which also demonstrates delayed enhancement measures 17 x 15 mm on image 21/2. No  extrahepatic biliary ductal dilation. Pancreas: No pancreatic ductal dilation or evidence of acute inflammation. Spleen: Normal in size without focal abnormality. Adrenals/Urinary Tract: Bilateral adrenal glands appear normal. No hydronephrosis. No nephrolithiasis. Bilateral renal scarring. Symmetric enhancement and excretion of contrast from the bilateral kidneys. No suspicious renal mass. Stomach/Bowel: Radiopaque enteric contrast material traverses the splenic flexure. Stomach is unremarkable for degree of distension. No pathologic dilation of large or small bowel. Tumor involvement of the hepatic flexure of the colon described above. Vascular/Lymphatic: The portal, splenic and superior mesenteric veins are patent. Hepatic veins are patent. No evidence of tumor in portal or hepatic vein. Conventional hepatic arterial supply. Aortic atherosclerosis without aneurysmal dilation. No pathologically enlarged abdominal lymph nodes. Other: No abdominal ascites. Musculoskeletal: Multilevel degenerative changes spine. Lower thoracic vertebral body hemangioma. No aggressive lytic or blastic lesion of bone. IMPRESSION: 1. Ill-defined 4.9 cm hypodense mass which demonstrates delayed enhancement extending from the region of the gallbladder fossa and obstructs the biliary tree with intrahepatic biliary ductal dilation. Additionally, the inferior aspect of the mass abuts and invades the colonic wall at the hepatic flexure with obliteration of the gallbladder fossa. Findings are most consistent with cholangiocarcinoma. 2. Smaller hypodense segment III hepatic lesion which also demonstrates delayed enhancement, concerning for metastasis. 3. Pleural-based 14 mm right lower lobe pulmonary nodule is similar in size to most recent prior, possibly reflecting a metastatic lesion or primary bronchogenic tumor. 4. No pathologically enlarged abdominal lymph nodes. 5.  Aortic Atherosclerosis (ICD10-I70.0). These results will be called to  the ordering clinician or representative by the Radiologist Assistant, and communication documented in the PACS or [employer]. Electronically Signed   By: Dahlia Bailiff M.D.   On: 01/28/2021 18:44   DG ERCP  Result Date: 02/17/2021 CLINICAL DATA:  History of cholangiocarcinoma.  ERCP. EXAM: ERCP PET-CT-02/03/2021 TECHNIQUE: Multiple spot images obtained with the fluoroscopic device and submitted for interpretation post-procedure. FLUOROSCOPY TIME:  2 minutes, 15 seconds (46.3 mGy) COMPARISON:  PET-CT-02/03/2021; CT abdomen and pelvis-01/28/2021 FINDINGS: Fifteen spot intraoperative fluoroscopic images of the right upper abdominal quadrant during ERCP are provided for review Initial image demonstrates an ERCP probe overlying the right upper abdominal quadrant. Subsequent images demonstrate selective cannulation of the pancreatic duct, ultimately  with pancreatic stent placement. Subsequent images demonstrate cannulation and opacification of the CBD with irregular narrowing at the level of the biliary hilum. Subsequent images demonstrate biliary plasty at the level of the central aspect of the left intrahepatic bile duct, ultimately with placement of bilateral internal plastic biliary stents. There is no definitive opacification of the cystic duct. IMPRESSION: ERCP with biliary plasty and placement of bilateral internal biliary stents as well as a pancreatic stent. These images were submitted for radiologic interpretation only. Please see the procedural report for the amount of contrast and the fluoroscopy time utilized. Electronically Signed   By: Sandi Mariscal M.D.   On: 02/17/2021 15:40   DG C-Arm 1-60 Min-No Report  Result Date: 02/17/2021 Fluoroscopy was utilized by the requesting physician.  No radiographic interpretation.    Labs:  CBC: Recent Labs    02/14/21 1432 02/15/21 0505 02/17/21 1123 02/18/21 0136  WBC 4.7 4.1 4.3 4.7  HGB 13.2 12.6* 14.2 10.9*  HCT 39.4 37.1* 41.4 32.5*  PLT  260 212 246 193    COAGS: Recent Labs    02/16/21 0448 02/17/21 0231 02/17/21 1123 02/18/21 0136  INR 2.2* 1.8* 1.4* 1.5*    BMP: Recent Labs    02/14/21 1432 02/15/21 0505 02/17/21 1123 02/18/21 0136  NA 137 136 137 131*  K 3.6 3.5 3.4* 2.9*  CL 97* 101 98 97*  CO2 32 28 28 25   GLUCOSE 160* 107* 116* 129*  BUN 23 19 16 22   CALCIUM 9.5 8.9 9.7 8.1*  CREATININE 1.33* 1.05 1.19 1.08  GFRNONAA 51* >60 58* >60    LIVER FUNCTION TESTS: Recent Labs    02/14/21 1432 02/15/21 0505 02/17/21 1123 02/18/21 0136  BILITOT 2.8* 2.4* 3.3* 3.3*  AST 97* 86* 124* 126*  ALT 32 45* 14 37  ALKPHOS 649* 564* 802* 558*  PROT 7.3 6.4* 7.6 5.4*  ALBUMIN 3.7 3.2* 3.8 2.7*    TUMOR MARKERS: No results for input(s): AFPTM, CEA, CA199, CHROMGRNA in the last 8760 hours.  Assessment and Plan: 88 y.o. male ,remote smoker, with past medical history significant for bicuspid aortic valve on Coumadin with mechanical AVR 1991, ascending aortic aneurysm, hypertension, Parkinson's disease, prostate cancer, carotid artery stenosis, left bundle branch block, Mnire's disease, colonic polyps.  He has been followed by CT surgery for lung nodule as well as the ascending aortic aneurysm.  In December 2022 patient underwent CT chest which revealed:  .1. The right lower lobe pulmonary nodule has enlarged, suspicious for primary bronchogenic carcinoma. 2. Abnormal appearance of the liver/biliary tree, incompletely and suboptimally evaluated. Intrahepatic and possible extrahepatic biliary duct dilatation, with central hepatic hypoattenuation. Findings are suspicious for either a cholangiocarcinoma or distal common duct obstruction. Recommend dedicated pre and post contrast abdominal CT or MRI. Given patient age, CT would likely be more practical. 3. No thoracic adenopathy. 4. Similar ascending aortic aneurysm, 4.5 cm. Ascending thoracic aortic aneurysm. Recommend semi-annual imaging followup by CTA  or MRA and referral to cardiothoracic surgery if not already obtained. This recommendation follows 2010 ACCF/AHA/AATS/ACR/ASA/SCA/SCAI/SIR/STS/SVM Guidelines for the Diagnosis and Management of Patients With Thoracic Aortic Disease. Circulation. 2010; 121: S970-Y637. Aortic aneurysm NOS (ICD10-I71.9) 5. Aortic atherosclerosis (ICD10-I70.0), coronary artery atherosclerosis and emphysema (CHY85-O27.9)  CT abdomen on 01/28/21 revealed:   1. Ill-defined 4.9 cm hypodense mass which demonstrates delayed enhancement extending from the region of the gallbladder fossa and obstructs the biliary tree with intrahepatic biliary ductal dilation. Additionally, the inferior aspect of the mass abuts and invades  the colonic wall at the hepatic flexure with obliteration of the gallbladder fossa. Findings are most consistent with cholangiocarcinoma. 2. Smaller hypodense segment III hepatic lesion which also demonstrates delayed enhancement, concerning for metastasis. 3. Pleural-based 14 mm right lower lobe pulmonary nodule is similar in size to most recent prior, possibly reflecting a metastatic lesion or primary bronchogenic tumor. 4. No pathologically enlarged abdominal lymph nodes. 5.  Aortic Atherosclerosis   PET scan 02/03/2021 revealed:  1. Large ill-defined infiltrating central liver mass is hypermetabolic and consistent with cholangiocarcinoma. Hypermetabolic metastatic focus in the left hepatic lobe. Celiac axis adenopathy. 2. The peripheral wedge-shaped right lower lobe pulmonary lesion is weakly hypermetabolic and more likely rounded atelectasis and less likely neoplastic  He was direct admitted for ERCP on 2/1 which revealed:  - No gross lesions in esophagus proximally. LA Grade B esophagitis with no bleeding - distally. Non-obstructing Schatzki ring. - J-shaped deformity in the entire stomach. No gross mucosal lesions in the stomach. - No gross lesions in the duodenal bulb, in the  first portion of the duodenum and in the second portion of the duodenum. - The major papilla appeared normal. - Multiple severe biliary strictures were found in the common hepatic duct and hepatic duct system (Bismuth II). The strictures were malignant appearing. The CHD, RHD-CHD, LHD-CHD strictures were dilated. These were brushed for cytology. - One plastic biliary stent was placed into the left hepatic duct. - One plastic biliary stent was placed into the right hepatic duct. - One temporary plastic pancreatic stent was placed into the ventral pancreatic duct to decrease PEP.   Cytology from above brushings revealed atypical cells but were not diagnostic of malignancy.  Request now received from oncology for image guided liver mass biopsy for further evaluation.  Imaging studies have been reviewed by Dr. Dwaine Gale.Risks and benefits of procedure was discussed with the patient /spouse including, but not limited to bleeding, infection, damage to adjacent structures or low yield requiring additional tests.  All of the questions were answered and there is agreement to proceed.  Consent signed and in chart.  Procedure scheduled for 2/3.  Latest labs include normal WBC, hemoglobin 10.9, platelets 193k, creatinine normal, K 2.9, total bilirubin 3.3, PT 17.9, INR 1.5.  Thank you for this interesting consult.  I greatly enjoyed meeting SLAYDE BRAULT and look forward to participating in their care.  A copy of this report was sent to the requesting provider on this date.  Electronically Signed: D. Rowe Robert, PA-C 02/18/2021, 3:24 PM   I spent a total of   25 minutes  in face to face in clinical consultation, greater than 50% of which was counseling/coordinating care for image guided liver mass biopsy

## 2021-02-18 NOTE — Progress Notes (Signed)
Mobility Specialist Progress Note   02/18/21 1215  Mobility  Activity Ambulated independently in hallway  Level of Assistance Independent after set-up  Assistive Device None  Distance Ambulated (ft) 3850 ft  Activity Response Tolerated well  $Mobility charge 1 Mobility   Received pt in chair having no complaints and agreeable to mobility. Asymptomatic throughout ambulation, returned back to chair w/ call bell in reach and all needs met.  Holland Falling Mobility Specialist Phone Number [phone]

## 2021-02-18 NOTE — Progress Notes (Signed)
Gastroenterology Inpatient Follow Up    Subjective: S/p ERCP with sphincterectomy, stents in right and left hepatic duct and pancreatic duct  Patient standing up at bedside eating a cracker. Patient states he denies any epigastric pain and the pain in his abdomen is all gone. Denies nausea, vomiting.  Has not had a bowel movement yet. Patient does have a question about when he should start his Coumadin possible liver biopsy tomorrow  Objective: Vital signs in last 24 hours: Temp:  [96.9 F (36.1 C)-98.3 F (36.8 C)] 97.5 F (36.4 C) (02/02 0850) Pulse Rate:  [51-60] 60 (02/02 0850) Resp:  [15-20] 18 (02/02 0850) BP: (119-182)/(54-87) 127/54 (02/02 0850) SpO2:  [98 %-100 %] 100 % (02/02 0850) Last BM Date: 02/16/21  Intake/Output from previous day: 02/01 0701 - 02/02 0700 In: 360 [P.O.:360] Out: -  Intake/Output this shift: No intake/output data recorded.  General appearance: alert and cooperative Resp: no increased WOB Cardio: regular rate GI: soft, nontender AB  Extremities: extremities normal, atraumatic, no cyanosis or edema  Lab Results: Recent Labs    02/17/21 1123 02/18/21 0136  WBC 4.3 4.7  HGB 14.2 10.9*  HCT 41.4 32.5*  PLT 246 193   BMET Recent Labs    02/17/21 1123 02/18/21 0136  NA 137 131*  K 3.4* 2.9*  CL 98 97*  CO2 28 25  GLUCOSE 116* 129*  BUN 16 22  CREATININE 1.19 1.08  CALCIUM 9.7 8.1*   LFT Recent Labs    02/18/21 0136  PROT 5.4*  ALBUMIN 2.7*  AST 126*  ALT 37  ALKPHOS 558*  BILITOT 3.3*  BILIDIR 1.3*   PT/INR Recent Labs    02/17/21 1123 02/18/21 0136  LABPROT 16.6* 17.9*  INR 1.4* 1.5*   Hepatitis Panel No results for input(s): HEPBSAG, HCVAB, HEPAIGM, HEPBIGM in the last 72 hours. C-Diff No results for input(s): CDIFFTOX in the last 72 hours.  Studies/Results: DG ERCP  Result Date: 02/17/2021 CLINICAL DATA:  History of cholangiocarcinoma.  ERCP. EXAM: ERCP PET-CT-02/03/2021 TECHNIQUE: Multiple spot  images obtained with the fluoroscopic device and submitted for interpretation post-procedure. FLUOROSCOPY TIME:  2 minutes, 15 seconds (46.3 mGy) COMPARISON:  PET-CT-02/03/2021; CT abdomen and pelvis-01/28/2021 FINDINGS: Fifteen spot intraoperative fluoroscopic images of the right upper abdominal quadrant during ERCP are provided for review Initial image demonstrates an ERCP probe overlying the right upper abdominal quadrant. Subsequent images demonstrate selective cannulation of the pancreatic duct, ultimately with pancreatic stent placement. Subsequent images demonstrate cannulation and opacification of the CBD with irregular narrowing at the level of the biliary hilum. Subsequent images demonstrate biliary plasty at the level of the central aspect of the left intrahepatic bile duct, ultimately with placement of bilateral internal plastic biliary stents. There is no definitive opacification of the cystic duct. IMPRESSION: ERCP with biliary plasty and placement of bilateral internal biliary stents as well as a pancreatic stent. These images were submitted for radiologic interpretation only. Please see the procedural report for the amount of contrast and the fluoroscopy time utilized. Electronically Signed   By: Sandi Mariscal M.D.   On: 02/17/2021 15:40   DG C-Arm 1-60 Min-No Report  Result Date: 02/17/2021 Fluoroscopy was utilized by the requesting physician.  No radiographic interpretation.    Medications: I have reviewed the patient's current medications. Scheduled:  carbidopa-levodopa  1 tablet Oral 3 times per day   levothyroxine  75 mcg Oral Q0600   melatonin  5 mg Oral QHS   pantoprazole  40 mg  Oral Daily   potassium chloride  40 mEq Oral BID   triamterene-hydrochlorothiazide  1 tablet Oral Daily   Continuous: FXO:VANVBTYOMAYOK, magnesium hydroxide, ondansetron **OR** ondansetron (ZOFRAN) IV, sodium chloride  Assessment/Plan: 72M w/hx of mechanical aortic valve on warfarin, Parkinson's  disease, prior prostate cancer, this PET scan showing ill-defined infiltrating central liver mass most consistent with cholangiocarcinoma.  Status post ERCP with sphincterectomy and stents of right, left hepatic ducts and pancreatic duct Pending cell brushing cytology WBC 4.7 HGB 10.9 Platelets 193 AST 126 ALT 37  Alkphos 558 TBili 3.3 02/18/2021 INR 1.5  No leukocytosis, downtrending alkaline phosphatase, stable AST and ALT. We will need 40-month follow-up for ERCP for stent replacement Continue evaluation with oncology, possible IR intervention tomorrow for liver biopsy, winded resume Coumadin per IR.   Please let GI know if there is anything else needed to be done this visit or the future with this pleasant patient.    LOS: 4 days   Vladimir Crofts 02/18/2021, 12:46 PM

## 2021-02-18 NOTE — Telephone Encounter (Signed)
Recall has been entered for 4 month ERCP

## 2021-02-18 NOTE — Progress Notes (Signed)
1 Day Post-Op  Subjective: ERCP with stent placement completed yesterday. No abdominal pain this morning. Afebrile. LFTs stable.   Objective: Vital signs in last 24 hours: Temp:  [96.9 F (36.1 C)-98.3 F (36.8 C)] 98.3 F (36.8 C) (02/02 0414) Pulse Rate:  [51-87] 60 (02/02 0414) Resp:  [15-20] 18 (02/02 0414) BP: (119-182)/(55-87) 127/63 (02/02 0414) SpO2:  [98 %-100 %] 98 % (02/02 0414) Weight:  [62.1 kg] 62.1 kg (02/01 1221) Last BM Date: 02/16/21  Intake/Output from previous day: 02/01 0701 - 02/02 0700 In: 360 [P.O.:360] Out: -  Intake/Output this shift: No intake/output data recorded.  PE: General: resting comfortably, NAD Neuro: alert and oriented, no focal deficits Resp: normal work of breathing on room air Abdomen: soft, nondistended, nontender to palpation.  Extremities: warm and well-perfused   Lab Results:  Recent Labs    02/17/21 1123 02/18/21 0136  WBC 4.3 4.7  HGB 14.2 10.9*  HCT 41.4 32.5*  PLT 246 193   BMET Recent Labs    02/17/21 1123 02/18/21 0136  NA 137 131*  K 3.4* 2.9*  CL 98 97*  CO2 28 25  GLUCOSE 116* 129*  BUN 16 22  CREATININE 1.19 1.08  CALCIUM 9.7 8.1*   PT/INR Recent Labs    02/17/21 1123 02/18/21 0136  LABPROT 16.6* 17.9*  INR 1.4* 1.5*   CMP     Component Value Date/Time   NA 131 (L) 02/18/2021 0136   NA 142 07/08/2016 1449   K 2.9 (L) 02/18/2021 0136   CL 97 (L) 02/18/2021 0136   CO2 25 02/18/2021 0136   GLUCOSE 129 (H) 02/18/2021 0136   BUN 22 02/18/2021 0136   BUN 26 07/08/2016 1449   CREATININE 1.08 02/18/2021 0136   CREATININE 1.09 07/30/2014 1400   CALCIUM 8.1 (L) 02/18/2021 0136   PROT 5.4 (L) 02/18/2021 0136   PROT 6.6 07/08/2016 1449   ALBUMIN 2.7 (L) 02/18/2021 0136   ALBUMIN 4.5 07/08/2016 1449   AST 126 (H) 02/18/2021 0136   ALT 37 02/18/2021 0136   ALKPHOS 558 (H) 02/18/2021 0136   BILITOT 3.3 (H) 02/18/2021 0136   BILITOT 1.7 (H) 07/08/2016 1449   GFRNONAA >60 02/18/2021 0136    GFRAA >60 11/17/2016 1026   Lipase  No results found for: LIPASE     Studies/Results: DG ERCP  Result Date: 02/17/2021 CLINICAL DATA:  History of cholangiocarcinoma.  ERCP. EXAM: ERCP PET-CT-02/03/2021 TECHNIQUE: Multiple spot images obtained with the fluoroscopic device and submitted for interpretation post-procedure. FLUOROSCOPY TIME:  2 minutes, 15 seconds (46.3 mGy) COMPARISON:  PET-CT-02/03/2021; CT abdomen and pelvis-01/28/2021 FINDINGS: Fifteen spot intraoperative fluoroscopic images of the right upper abdominal quadrant during ERCP are provided for review Initial image demonstrates an ERCP probe overlying the right upper abdominal quadrant. Subsequent images demonstrate selective cannulation of the pancreatic duct, ultimately with pancreatic stent placement. Subsequent images demonstrate cannulation and opacification of the CBD with irregular narrowing at the level of the biliary hilum. Subsequent images demonstrate biliary plasty at the level of the central aspect of the left intrahepatic bile duct, ultimately with placement of bilateral internal plastic biliary stents. There is no definitive opacification of the cystic duct. IMPRESSION: ERCP with biliary plasty and placement of bilateral internal biliary stents as well as a pancreatic stent. These images were submitted for radiologic interpretation only. Please see the procedural report for the amount of contrast and the fluoroscopy time utilized. Electronically Signed   By: Sandi Mariscal M.D.   On:  02/17/2021 15:40   DG C-Arm 1-60 Min-No Report  Result Date: 02/17/2021 Fluoroscopy was utilized by the requesting physician.  No radiographic interpretation.    Anti-infectives: Anti-infectives (From admission, onward)    None        Assessment/Plan 88 yo male with large intrahepatic mass, likely cholangiocarcinoma, with biliary obstruction. - Cytology pending. If diagnosis not confirmed on cytology, will consult IR for a  percutaneous biopsy. - Regular diet - Home meds as appropriate - VTE: SCDs, home Coumadin on hold - Dispo: inpatient, med-surg floor. Possible discharge this afternoon if cytology results confirm malignancy. If not, will keep inpatient to consult IR for percutaneous biopsy.    LOS: 4 days    Michaelle Birks, MD [facility] General, Hepatobiliary and Pancreatic Surgery 02/18/21 7:16 AM

## 2021-02-18 NOTE — Telephone Encounter (Signed)
-----   Message from Irving Copas., MD sent at 02/17/2021  5:07 PM EST ----- Regarding: ERCP follow-up David Benton, This patient needs an ERCP recall in 4 months.  Cholangiocarcinoma diagnosis.  Biliary stent exchange.  Thanks. GM

## 2021-02-19 ENCOUNTER — Encounter: Payer: Self-pay | Admitting: Gastroenterology

## 2021-02-19 ENCOUNTER — Inpatient Hospital Stay (HOSPITAL_COMMUNITY): Payer: Medicare Other

## 2021-02-19 LAB — COMPREHENSIVE METABOLIC PANEL
ALT: 20 U/L (ref 0–44)
AST: 100 U/L — ABNORMAL HIGH (ref 15–41)
Albumin: 2.9 g/dL — ABNORMAL LOW (ref 3.5–5.0)
Alkaline Phosphatase: 576 U/L — ABNORMAL HIGH (ref 38–126)
Anion gap: 8 (ref 5–15)
BUN: 18 mg/dL (ref 8–23)
CO2: 26 mmol/L (ref 22–32)
Calcium: 8.6 mg/dL — ABNORMAL LOW (ref 8.9–10.3)
Chloride: 103 mmol/L (ref 98–111)
Creatinine, Ser: 1.05 mg/dL (ref 0.61–1.24)
GFR, Estimated: >60 mL/min (ref >60)
Glucose, Bld: 127 mg/dL — ABNORMAL HIGH (ref 70–99)
Potassium: 3.7 mmol/L (ref 3.5–5.1)
Sodium: 137 mmol/L (ref 135–145)
Total Bilirubin: 2 mg/dL — ABNORMAL HIGH (ref 0.3–1.2)
Total Protein: 5.9 g/dL — ABNORMAL LOW (ref 6.5–8.1)

## 2021-02-19 LAB — CBC WITH DIFFERENTIAL/PLATELET
Abs Immature Granulocytes: 0.03 10*3/uL (ref 0.00–0.07)
Basophils Absolute: 0 10*3/uL (ref 0.0–0.1)
Basophils Relative: 0 %
Eosinophils Absolute: 0.1 10*3/uL (ref 0.0–0.5)
Eosinophils Relative: 3 %
HCT: 33.6 % — ABNORMAL LOW (ref 39.0–52.0)
Hemoglobin: 11.5 g/dL — ABNORMAL LOW (ref 13.0–17.0)
Immature Granulocytes: 1 %
Lymphocytes Relative: 17 %
Lymphs Abs: 0.8 10*3/uL (ref 0.7–4.0)
MCH: 30.1 pg (ref 26.0–34.0)
MCHC: 34.2 g/dL (ref 30.0–36.0)
MCV: 88 fL (ref 80.0–100.0)
Monocytes Absolute: 0.7 10*3/uL (ref 0.1–1.0)
Monocytes Relative: 15 %
Neutro Abs: 3 10*3/uL (ref 1.7–7.7)
Neutrophils Relative %: 64 %
Platelets: 199 10*3/uL (ref 150–400)
RBC: 3.82 MIL/uL — ABNORMAL LOW (ref 4.22–5.81)
RDW: 13.6 % (ref 11.5–15.5)
WBC: 4.7 10*3/uL (ref 4.0–10.5)
nRBC: 0 % (ref 0.0–0.2)

## 2021-02-19 LAB — PROTIME-INR
INR: 1.3 — ABNORMAL HIGH (ref 0.8–1.2)
Prothrombin Time: 15.9 seconds — ABNORMAL HIGH (ref 11.4–15.2)

## 2021-02-19 LAB — CANCER ANTIGEN 19-9: CA 19-9: <2 U/mL (ref 0–35)

## 2021-02-19 MED ORDER — GELATIN ABSORBABLE 12-7 MM EX MISC
CUTANEOUS | Status: AC
  Filled 2021-02-19: qty 1

## 2021-02-19 MED ORDER — MIDAZOLAM HCL 2 MG/2ML IJ SOLN
INTRAMUSCULAR | Status: AC | PRN
  Administered 2021-02-19: 1 mg via INTRAVENOUS

## 2021-02-19 MED ORDER — FENTANYL CITRATE (PF) 100 MCG/2ML IJ SOLN
INTRAMUSCULAR | Status: AC
  Filled 2021-02-19: qty 2

## 2021-02-19 MED ORDER — MIDAZOLAM HCL 2 MG/2ML IJ SOLN
INTRAMUSCULAR | Status: AC
  Filled 2021-02-19: qty 2

## 2021-02-19 MED ORDER — LIDOCAINE HCL (PF) 1 % IJ SOLN
INTRAMUSCULAR | Status: AC
  Filled 2021-02-19: qty 30

## 2021-02-19 MED ORDER — FENTANYL CITRATE (PF) 100 MCG/2ML IJ SOLN
INTRAMUSCULAR | Status: AC | PRN
  Administered 2021-02-19: 25 ug via INTRAVENOUS

## 2021-02-19 NOTE — Progress Notes (Signed)
2 Days Post-Op  Subjective: Brushings from ERCP did not confirm malignancy. Patient has no acute complaints. Bilirubin downtrending.   Objective: Vital signs in last 24 hours: Temp:  [97.5 F (36.4 C)-98.6 F (37 C)] 97.6 F (36.4 C) (02/03 0323) Pulse Rate:  [60-73] 68 (02/03 0323) Resp:  [17-19] 17 (02/03 0323) BP: (122-144)/(54-73) 144/73 (02/03 0323) SpO2:  [100 %] 100 % (02/03 0323) Last BM Date: 02/16/21  Intake/Output from previous day: 02/02 0701 - 02/03 0700 In: 1060 [P.O.:660; IV Piggyback:400] Out: -  Intake/Output this shift: No intake/output data recorded.  PE: General: resting comfortably, NAD Neuro: alert and oriented, no focal deficits Resp: normal work of breathing on room air Abdomen: soft, nondistended, nontender to palpation.  Extremities: warm and well-perfused   Lab Results:  Recent Labs    02/18/21 0136 02/19/21 0059  WBC 4.7 4.7  HGB 10.9* 11.5*  HCT 32.5* 33.6*  PLT 193 199   BMET Recent Labs    02/18/21 0136 02/19/21 0059  NA 131* 137  K 2.9* 3.7  CL 97* 103  CO2 25 26  GLUCOSE 129* 127*  BUN 22 18  CREATININE 1.08 1.05  CALCIUM 8.1* 8.6*   PT/INR Recent Labs    02/18/21 0136 02/19/21 0059  LABPROT 17.9* 15.9*  INR 1.5* 1.3*   CMP     Component Value Date/Time   NA 137 02/19/2021 0059   NA 142 07/08/2016 1449   K 3.7 02/19/2021 0059   CL 103 02/19/2021 0059   CO2 26 02/19/2021 0059   GLUCOSE 127 (H) 02/19/2021 0059   BUN 18 02/19/2021 0059   BUN 26 07/08/2016 1449   CREATININE 1.05 02/19/2021 0059   CREATININE 1.09 07/30/2014 1400   CALCIUM 8.6 (L) 02/19/2021 0059   PROT 5.9 (L) 02/19/2021 0059   PROT 6.6 07/08/2016 1449   ALBUMIN 2.9 (L) 02/19/2021 0059   ALBUMIN 4.5 07/08/2016 1449   AST 100 (H) 02/19/2021 0059   ALT 20 02/19/2021 0059   ALKPHOS 576 (H) 02/19/2021 0059   BILITOT 2.0 (H) 02/19/2021 0059   BILITOT 1.7 (H) 07/08/2016 1449   GFRNONAA >60 02/19/2021 0059   GFRAA >60 11/17/2016 1026    Lipase  No results found for: LIPASE     Studies/Results: DG ERCP  Result Date: 02/17/2021 CLINICAL DATA:  History of cholangiocarcinoma.  ERCP. EXAM: ERCP PET-CT-02/03/2021 TECHNIQUE: Multiple spot images obtained with the fluoroscopic device and submitted for interpretation post-procedure. FLUOROSCOPY TIME:  2 minutes, 15 seconds (46.3 mGy) COMPARISON:  PET-CT-02/03/2021; CT abdomen and pelvis-01/28/2021 FINDINGS: Fifteen spot intraoperative fluoroscopic images of the right upper abdominal quadrant during ERCP are provided for review Initial image demonstrates an ERCP probe overlying the right upper abdominal quadrant. Subsequent images demonstrate selective cannulation of the pancreatic duct, ultimately with pancreatic stent placement. Subsequent images demonstrate cannulation and opacification of the CBD with irregular narrowing at the level of the biliary hilum. Subsequent images demonstrate biliary plasty at the level of the central aspect of the left intrahepatic bile duct, ultimately with placement of bilateral internal plastic biliary stents. There is no definitive opacification of the cystic duct. IMPRESSION: ERCP with biliary plasty and placement of bilateral internal biliary stents as well as a pancreatic stent. These images were submitted for radiologic interpretation only. Please see the procedural report for the amount of contrast and the fluoroscopy time utilized. Electronically Signed   By: Sandi Mariscal M.D.   On: 02/17/2021 15:40   DG C-Arm 1-60 Min-No Report  Result Date: 02/17/2021 Fluoroscopy was utilized by the requesting physician.  No radiographic interpretation.    Anti-infectives: Anti-infectives (From admission, onward)    None        Assessment/Plan 88 yo male with large intrahepatic mass, likely cholangiocarcinoma, with biliary obstruction. - Cytology from biliary brushings did not confirm malignancy. IR consulted for US-guided percutaneous biopsy, will be done  today. - NPO for procedure - Home meds as appropriate - VTE: SCDs, home Coumadin on hold. Discussed with patient's cardiologist, ok to resume Coumadin without bridging following procedure. - Dispo: inpatient, med-surg floor. Possible discharge after procedure vs tomorrow morning.    LOS: 5 days    Michaelle Birks, MD [facility] General, Hepatobiliary and Pancreatic Surgery 02/19/21 7:35 AM

## 2021-02-19 NOTE — Discharge Summary (Signed)
Physician Discharge Summary   Patient ID: David Benton [id number] 88 y.o. 1929-06-08  Admit date: 02/14/2021  Discharge date and time: No discharge date for patient encounter.   Admitting Physician: Dwan Bolt, MD   Discharge Physician: Michaelle Birks, MD  Admission Diagnoses: Biliary obstruction due to malignant neoplasm ([place]) [K83.1, C80.1]  Discharge Diagnoses: Biliary obstruction due to malignant neoplasm  Admission Condition: good  Discharged Condition: good  Indication for Admission: David Benton is an 88 year old male been undergoing surveillance for a pulmonary nodule, and a recent chest CT showed a mass in the liver with biliary ductal dilation.  He underwent dedicated liver imaging, which confirmed a large malignant appearing mass in the right liver with a second lesion in the left lobe.  Imaging features were highly suspicious for an intrahepatic cholangiocarcinoma.  Labs showed a significant elevation in alkaline phosphatase with mild hyperbilirubinemia.  The patient was admitted for biliary decompression and to obtain a tissue diagnosis.  Hospital Course: The patient was directly admitted on 02/14/2021.  Anticoagulation was discussed with his primary cardiologist, who deemed him low risk to hold Coumadin for procedures without bridging.  His INR slowly down trended over the next few days and was less than 2 on 2/1.  He underwent an ERCP with placement of 2 biliary stents on 2/1.  Multiple biliary strictures were noted, however brushings with cytology of the strictures showed only atypical cells with no confirmed malignancy.  Interventional radiology was consulted for percutaneous biopsy to obtain further tissue, which was performed on 2/3.  Following the procedure the patient had stable vital signs.  His LFTs were downtrending following placement of biliary stents.  He was examined and deemed appropriate for discharge home.  He will follow-up in 1 week with medical oncology to  discuss systemic therapy and at that time he will have labs rechecked.  Per cardiology, he may resume his Coumadin without bridging.  Consults: GI and medical oncology and interventional radiology  Significant Diagnostic Studies: N/A  Treatments: procedures: ERCP with biliary stent placement and US-guided liver biopsy  Discharge Exam: General: resting comfortably, NAD Neuro: alert and oriented, no focal deficits Resp: normal work of breathing on room air Abdomen: soft, nondistended, nontender to palpation.  Extremities: warm and well-perfused  Disposition: Discharge disposition: 01-Home or Self Care       Patient Instructions:  Allergies as of 02/19/2021       Reactions   Codeine Nausea And Vomiting        Medication List     TAKE these medications    ACETAMINOPHEN PO Take 650 mg by mouth every 6 (six) hours as needed for pain.   amoxicillin 500 MG capsule Commonly known as: AMOXIL Take 4 capsules (2,000 mg) one hour prior to all dental visits.   atorvastatin 40 MG tablet Commonly known as: LIPITOR TAKE 1 TABLET BY MOUTH EVERY DAY   atorvastatin 40 MG tablet Commonly known as: LIPITOR Take 40 mg by mouth daily.   carbidopa-levodopa 25-100 MG tablet Commonly known as: SINEMET IR TAKE 1 TABLET BY MOUTH 3 TIMES DAILY What changed: additional instructions   levothyroxine 75 MCG tablet Commonly known as: SYNTHROID TAKE 1 TABLET BY MOUTH EVERY DAY   levothyroxine 75 MCG tablet Commonly known as: SYNTHROID Take 75 mcg by mouth daily.   magnesium hydroxide 400 MG/5ML suspension Commonly known as: MILK OF MAGNESIA Take 30 mLs by mouth daily. Reported on 04/21/2015   pantoprazole 40 MG tablet Commonly known as: PROTONIX Take  1 tablet (40 mg total) by mouth daily.   Probiotic-10 Ultimate Caps Take 1 capsule by mouth daily.   saline Gel Place 1 application into the nose in the morning and at bedtime.   sodium chloride 0.65 % Soln nasal spray Commonly  known as: OCEAN Place 1 spray into both nostrils at bedtime as needed for congestion. Reported on 04/21/2015   triamterene-hydrochlorothiazide 37.5-25 MG tablet Commonly known as: MAXZIDE-25 TAKE 1 TABLET BY MOUTH EVERY MORNING What changed: when to take this   warfarin 5 MG tablet Commonly known as: COUMADIN Take as directed. If you are unsure how to take this medication, talk to your nurse or doctor. Original instructions: TAKE AS DIRECTED BY COUMADIN CLINIC What changed: See the new instructions.       Activity: activity as tolerated Diet: regular diet Wound Care: none needed  Follow-up with Dr. Burr Medico in 1 week.  Signed: Dwan Bolt 02/19/2021 2:46 PM

## 2021-02-19 NOTE — Discharge Instructions (Signed)
You may resume taking your Coumadin.  You will have a follow up visit next week with Dr. Burr Medico (your oncologist) to have labs checked and to review your biopsy results.  Monitor for any increased abdominal pain, fevers, dizziness, or light-headedness, please call the [facility] Surgery office at [phone].

## 2021-02-19 NOTE — Procedures (Signed)
Interventional Radiology Procedure Note  Procedure: US guided liver mass biopsy  Indication: Liver mass  Findings: Please refer to procedural dictation for full description.  Complications: None  EBL: < 10 mL  Miachel Roux, MD [phone]

## 2021-02-19 NOTE — Sedation Documentation (Signed)
Holding manual pressure to bx site x 5 min per Dr. Dwaine Gale

## 2021-02-19 NOTE — Progress Notes (Signed)
Discharge instructions given to patient and patient's spouse.  Education emphasized on signs of infection to watch for.  Mid-abdominal liver biopsy site with band-aid, CDI.  Encouraged to call the doctor for questions.  Discharged home.

## 2021-02-19 NOTE — Sedation Documentation (Signed)
Notable multifocal PVCs

## 2021-02-22 ENCOUNTER — Encounter (HOSPITAL_COMMUNITY): Payer: Self-pay | Admitting: Gastroenterology

## 2021-02-22 NOTE — Progress Notes (Signed)
I spoke with David Benton,  They are available for an appt with Dr Burr Medico this thursday 02/25/2021 at [facility].

## 2021-02-23 ENCOUNTER — Telehealth: Payer: Self-pay

## 2021-02-23 ENCOUNTER — Encounter (HOSPITAL_COMMUNITY): Payer: Self-pay | Admitting: *Deleted

## 2021-02-23 NOTE — Progress Notes (Signed)
Shiro Ellerman' wife Katharine Look was contacted by telephone to verify understanding of discharge instructions status post their most recent discharge from the hospital on the date:  02/19/21.  Inpatient discharge AVS was re-reviewed with patient, along with cancer center appointments.  Verification of understanding for oncology specific follow-up was validated using the Teach Back method.  Expressed that he is depressed and is unable to eat due to nausea.  To see his PCP tomorrow to address depression and was given a script for nausea, per wife and is being picked up today.  Transportation to appointments were confirmed for the patient as being self/caregiver.   Mrs. Kozub' questions were addressed to their satisfaction upon completion of this post discharge follow-up call for outpatient oncology.

## 2021-02-23 NOTE — Telephone Encounter (Signed)
--  Caller states her husband was Dx with bile duct and liver CA a week ago. Pt has had bile duct stents placed and is seeing his oncologist next week. Caller states the patient is very depressed, and in the past he has used Remeron to "take the edge off".  02/23/2021 11:11:26 AM See PCP within [place], RN, [employer]  Referrals REFERRED TO PCP OFFICE

## 2021-02-24 ENCOUNTER — Ambulatory Visit (INDEPENDENT_AMBULATORY_CARE_PROVIDER_SITE_OTHER): Payer: Medicare Other | Admitting: Family Medicine

## 2021-02-24 ENCOUNTER — Encounter: Payer: Self-pay | Admitting: Family Medicine

## 2021-02-24 VITALS — BP 118/60 | HR 71 | Temp 98.5°F | Wt 138.1 lb

## 2021-02-24 DIAGNOSIS — C221 Intrahepatic bile duct carcinoma: Secondary | ICD-10-CM | POA: Insufficient documentation

## 2021-02-24 DIAGNOSIS — C801 Malignant (primary) neoplasm, unspecified: Secondary | ICD-10-CM

## 2021-02-24 DIAGNOSIS — F418 Other specified anxiety disorders: Secondary | ICD-10-CM

## 2021-02-24 DIAGNOSIS — G20 Parkinson's disease: Secondary | ICD-10-CM | POA: Diagnosis not present

## 2021-02-24 DIAGNOSIS — I10 Essential (primary) hypertension: Secondary | ICD-10-CM

## 2021-02-24 DIAGNOSIS — K831 Obstruction of bile duct: Secondary | ICD-10-CM | POA: Diagnosis not present

## 2021-02-24 MED ORDER — LORAZEPAM 1 MG PO TABS: 1.0000 mg | ORAL_TABLET | Freq: Three times a day (TID) | ORAL | 2 refills | Status: AC | PRN

## 2021-02-24 NOTE — Progress Notes (Signed)
° °  Subjective:    Patient ID: David Benton, male    DOB: 09/17/1929, 88 y.o.   MRN: [id number]  HPI Here with his wife to follow up on a hospital stay from 02-14-21 to 02-19-21 for workup of lesions seen in his liver. These were seen incidentally on a chest CT. During this stay he had an ERCP procedure which included brushings and placement of biliary and pancreatic stents. Pathology revealed this to be a primary cholangiocarcinoma. He is meeting with Dr. Burr Medico in Oncology tomorrow to formulate a treatment plan. Since coming home David Benton has been extremely anxious and worried about what will happen to him and also to his wife. He understands that the prognosis for this is poor. He has a fair amount of nausea, though he has not vomited. His appetite is very poor as well. He is drinking water and he has eaten a small amount of soup and some scrambled eggs. He cannot sleep from the anxiety. He is taking his usual Pantoprazole for GERD.    Review of Systems  Constitutional:  Positive for appetite change, fatigue and unexpected weight change.  Respiratory: Negative.    Cardiovascular: Negative.   Gastrointestinal:  Positive for constipation and nausea. Negative for abdominal distention, abdominal pain, anal bleeding, blood in stool, diarrhea and vomiting.  Genitourinary: Negative.   Neurological: Negative.   Psychiatric/Behavioral:  Positive for dysphoric mood and sleep disturbance. Negative for agitation, behavioral problems, confusion, decreased concentration, hallucinations, self-injury and suicidal ideas. The patient is nervous/anxious.       Objective:   Physical Exam Constitutional:      Comments: Frail, weak   Cardiovascular:     Rate and Rhythm: Normal rate and regular rhythm.     Pulses: Normal pulses.     Heart sounds: Normal heart sounds.  Pulmonary:     Effort: Pulmonary effort is normal.     Breath sounds: Normal breath sounds.  Abdominal:     General: Abdomen is flat. Bowel sounds  are normal. There is no distension.     Palpations: Abdomen is soft. There is no mass.     Tenderness: There is no abdominal tenderness. There is no guarding.     Hernia: No hernia is present.  Neurological:     General: No focal deficit present.     Mental Status: He is alert and oriented to person, place, and time.  Psychiatric:        Behavior: Behavior normal.        Thought Content: Thought content normal.     Comments: His affect is depressed but he has good eye contact           Assessment & Plan:  He is struggling with anxiety, insomnia, anorexia, and nausea since recent diagnosis of cholangiocarcinoma. He can use Ondansetron for nausea. I encouraged him to drink 3 cans of Boost every day if possible. For the anxiety and insomnia he will try Lorazepam 1 mg TID as needed. He will meet with Oncology tomorrow. We spent a total of ( 35  ) minutes reviewing records and discussing these issues.  Alysia Penna, MD

## 2021-02-25 ENCOUNTER — Encounter: Payer: Self-pay | Admitting: Hematology

## 2021-02-25 ENCOUNTER — Other Ambulatory Visit: Payer: Self-pay

## 2021-02-25 ENCOUNTER — Inpatient Hospital Stay: Payer: Medicare Other | Attending: Hematology | Admitting: Hematology

## 2021-02-25 VITALS — BP 137/69 | HR 81 | Temp 98.6°F | Resp 18 | Ht 70.0 in | Wt 132.0 lb

## 2021-02-25 DIAGNOSIS — C221 Intrahepatic bile duct carcinoma: Secondary | ICD-10-CM | POA: Diagnosis not present

## 2021-02-25 DIAGNOSIS — K831 Obstruction of bile duct: Secondary | ICD-10-CM

## 2021-02-25 DIAGNOSIS — C787 Secondary malignant neoplasm of liver and intrahepatic bile duct: Secondary | ICD-10-CM | POA: Insufficient documentation

## 2021-02-25 DIAGNOSIS — Z66 Do not resuscitate: Secondary | ICD-10-CM | POA: Diagnosis not present

## 2021-02-25 DIAGNOSIS — C801 Malignant (primary) neoplasm, unspecified: Secondary | ICD-10-CM

## 2021-02-25 MED ORDER — ONDANSETRON 4 MG PO TBDP: 4.0000 mg | ORAL_TABLET | Freq: Four times a day (QID) | ORAL | 1 refills | Status: AC | PRN

## 2021-02-25 MED ORDER — MIRTAZAPINE 7.5 MG PO TABS: 7.5000 mg | ORAL_TABLET | Freq: Every day | ORAL | 0 refills | Status: AC

## 2021-02-25 NOTE — Progress Notes (Signed)
[place]   Telephone:([phone] [phone]   Clinic Follow up Note   Patient Care Team: [name], MD as PCP - General (Family Medicine) Sherren Mocha, MD as PCP - Cardiology (Cardiology) Grace Isaac, MD (Inactive) as Consulting Physician (Cardiothoracic Surgery) Sharyne Peach, MD as Consulting Physician (Ophthalmology) Druscilla Brownie, MD as Consulting Physician (Dermatology) Irine Seal, MD as Consulting Physician (Urology) Larey Dresser, MD as Consulting Physician (Cardiology) Viona Gilmore, [facility] as Pharmacist (Pharmacist) Tat, Eustace Quail, DO as Consulting Physician (Neurology) Sharmon Revere as Physician Assistant (Cardiology)  Date of Service:  02/25/2021  CHIEF COMPLAINT: f/u of cholangiocarcinoma  CURRENT THERAPY:  Supportive Care  ASSESSMENT & PLAN:  David Benton is a 88 y.o. male with   1. Cholangiocarcinoma with livre metastasis, cT2N0M0, stage II -found to have abnormal appearance of liver/biliary tree on chest CT 01/01/21 for surveillance of a lung nodule. CT AP on 01/28/21 showed a 4.9 cm mass from gallbladder fossa and obstructs biliary tree, a smaller segment 3 hepatic lesion, and a 14 mm RLL pleural-based pulmonary nodule. PET scan on 02/03/21 confirmed hypermetabolism to infiltrating central liver mass, left hepatic lobe focus, and celiac axis adenopathy. RLL lesion was weakly hypermetabolic, more likely atelectasis. -he was admitted by Dr. Zenia Resides and underwent inpatient ERCP with biopsy and stent placement. Performed on 02/17/21 by Dr. Rush Landmark, cytology from ERCP showed only atypical cells.  -liver biopsy on 02/19/21 confirmed moderately differentiated adenocarcinoma, supporting diagnosis of cholangiocarcinoma with differential including a pancreatic primary. -He has seen Dr. Zenia Resides and his cancer felt to be not resectable  --I reviewed the biopsy results and PET scan images with the patient and his wife today. We  discussed that his cancer is not curable but is treatable. I discussed treatment options, including chemo, radiation or boilogical agents. Given his advanced age and overall weakness, he is not a candidate for cytotoxic chemotherapy. We reviewed his case in tumor board, he is a candidate for palliative radiation to his primary tumor due to invading colon. I reviewed benefit and potential side effects from radiation therapy. I discussed that this could help prevent cancer related complications, such as bleeding or bowel perforation, or pain.  -I also discussed the role of immunotherapy in a timely therapy in cholangiocarcinoma.  I will order MMR and Foundation One on his biopsy sample to see if he is a candidate for immunotherapy or targeted therapy. -Pt's wife is very concerned about his QOL, and interested in palliative care alone.  I discussed the option of hospice, logistics of service, since they did and do not do.  Patient would like to have some time to think about it. -Patient and his wife will call us back with their decision.   2. Symptom Management: low appetite, insomnia, abdominal discomfort, anxiety -he was placed on lorazepam by his PCP for anxiety and sleep. I recommend Ativan during the day. -he has started to supplement with Boost lately. I will also prescribe mirtazapine to help with appetite, as well as sleep. I advised him to take the mirtazapine at night and to start with half an Ativan during the day as needed. -for his abdominal discomfort, I recommend he start with tylenol. If it worsens, they know to contact us. -I will have our dietician reach out to them by phone. -I also discussed that we have a Education officer, museum we can connect them with about anxiety.  3. Goal of care discussion, DNR  -We again  discussed the incurable nature of his cancer, and the overall poor prognosis, especially if he does not have good response to radiation therapy -The patient understands the goal of care  is palliative. -I discussed the role of hospice and palliative care; these are used when the patient is not on treatment. -I recommend DNR/DNI, they agree today. He states he has a living will in place.   PLAN: -I called in mirtazapine -referral to dietician (virtual visit) -will request MMR and FO on his biopsy sample  -f/u open, they will contact RN Santiago Glad with their decision on palliative radiation, versus hospice.   No problem-specific Assessment & Plan notes found for this encounter.   SUMMARY OF ONCOLOGIC HISTORY: Oncology History  Biliary obstruction due to malignant neoplasm ([place])  01/01/2021 Imaging   EXAM: CT CHEST WITHOUT CONTRAST  IMPRESSION: 1. The right lower lobe pulmonary nodule has enlarged, suspicious for primary bronchogenic carcinoma. 2. Abnormal appearance of the liver/biliary tree, incompletely and suboptimally evaluated. Intrahepatic and possible extrahepatic biliary duct dilatation, with central hepatic hypoattenuation. Findings are suspicious for either a cholangiocarcinoma or distal common duct obstruction. Recommend dedicated pre and post contrast abdominal CT or MRI. Given patient age, CT would likely be more practical. 3. No thoracic adenopathy. 4. Similar ascending aortic aneurysm, 4.5 cm. Ascending thoracic aortic aneurysm. Recommend semi-annual imaging followup by CTA or MRA and referral to cardiothoracic surgery if not already obtained. This recommendation follows 2010 ACCF/AHA/AATS/ACR/ASA/SCA/SCAI/SIR/STS/SVM Guidelines for the Diagnosis and Management of Patients With Thoracic Aortic Disease. Circulation. 2010; 121: B353-G992. Aortic aneurysm NOS (ICD10-I71.9) 5. Aortic atherosclerosis (ICD10-I70.0), coronary artery atherosclerosis and emphysema (ICD10-J43.9).   01/28/2021 Imaging   EXAM: CT ABDOMEN WITHOUT AND WITH CONTRAST  IMPRESSION: 1. Ill-defined 4.9 cm hypodense mass which demonstrates delayed enhancement extending from the region of the  gallbladder fossa and obstructs the biliary tree with intrahepatic biliary ductal dilation. Additionally, the inferior aspect of the mass abuts and invades the colonic wall at the hepatic flexure with obliteration of the gallbladder fossa. Findings are most consistent with cholangiocarcinoma. 2. Smaller hypodense segment III hepatic lesion which also demonstrates delayed enhancement, concerning for metastasis. 3. Pleural-based 14 mm right lower lobe pulmonary nodule is similar in size to most recent prior, possibly reflecting a metastatic lesion or primary bronchogenic tumor. 4. No pathologically enlarged abdominal lymph nodes. 5.  Aortic Atherosclerosis (ICD10-I70.0).   02/03/2021 PET scan   IMPRESSION: 1. Large ill-defined infiltrating central liver mass is hypermetabolic and consistent with cholangiocarcinoma. Hypermetabolic metastatic focus in the left hepatic lobe. Celiac axis adenopathy. 2. The peripheral wedge-shaped right lower lobe pulmonary lesion is weakly hypermetabolic and more likely rounded atelectasis and less likely neoplastic. Recommend continued surveillance.   02/12/2021 Initial Diagnosis   Biliary obstruction due to malignant neoplasm ([place])   02/17/2021 Procedure   ERCP, Dr. Rush Landmark  Impression: - No gross lesions in esophagus proximally. LA Grade B esophagitis with no bleeding - distally. Non-obstructing Schatzki ring. - J-shaped deformity in the entire stomach. No gross mucosal lesions in the stomach. - No gross lesions in the duodenal bulb, in the first portion of the duodenum and in the second portion of the duodenum. - The major papilla appeared normal. - Multiple severe biliary strictures were found in the common hepatic duct and hepatic duct system (Bismuth II). The strictures were malignant appearing. - The left intrahepatic branches were moderately dilated - likely from stricturing disease. - A biliary sphincterotomy was performed - mild ooze noted but  self-limited. - Double wire technique  performed to aid in biliary cannulation. - The biliary tree was swept and sludge was found. - The CHD, RHD-CHD, LHD-CHD strictures were dilated. These were brushed for cytology. - One plastic biliary stent was placed into the left hepatic duct. - One plastic biliary stent was placed into the right hepatic duct. - One temporary plastic pancreatic stent was placed into the ventral pancreatic duct to decrease PEP.   02/17/2021 Pathology Results   FINAL MICROSCOPIC DIAGNOSIS:  A. STRICTURE, RHD/CHD, BRUSHING:  - Atypical cells present  - See comment   B. STRICTURE, LHD/CHD, BRUSHING:  - Atypical cells present  - See comment   C. STRICTURE, CHD, BRUSHING:  - Atypical cells present  - See comment   COMMENT:  There are numerous benign glandular cells and occasional aggregates with cytologic atypia which in my opinion are not sufficient for diagnosis of malignancy.    02/19/2021 Pathology Results   FINAL MICROSCOPIC DIAGNOSIS:   A. LIVER, RIGHT MASS, NEEDLE CORE BIOPSY:  Moderately differentiated adenocarcinoma (see comment)   COMMENT:  Sections show cores of hepatic parenchyma infiltrated by irregular  angulated glands within a desmoplastic and fibrotic stroma lined by an atypical columnar epithelium composed of cells with variably large round to oval hyperchromatic nuclei.  There is a luminal mucin and patchy luminal dirty necrosis. Four immunohistochemical stains performed with adequate control.  The tumor is positive for cytokeratin 7 and cytokeratin 20.  The tumor also shows diffuse positivity for CDX2.  The tumor is negative for the pulmonary adeno marker TTF-1.   The histomorphology and this immunohistochemical pattern would support a diagnosis of cholangiocarcinoma; however, the differential diagnosis would include a pancreatic primary among others.  Clinical and radiologic correlation is recommended.       INTERVAL HISTORY:  TRESHON STANNARD  is here for a follow up of cholangiocarcinoma. He was last seen by me on 02/15/21 while he was in the hospital. He presents to the clinic accompanied by his wife. He reports he is recovering well since discharge. He does report some abdominal pain that is "pretty constant." He rates it ~2-3/10 and describes it as discomfort more so than pain. He reports he has constipation at baseline. He denies hematochezia. His wife reports his appetite is very low and he is barely eating. They note he started supplementing with Boost yesterday.   All other systems were reviewed with the patient and are negative.  MEDICAL HISTORY:  Past Medical History:  Diagnosis Date   Bicuspid AS s/p mechanical AVR in 1991    Echocardiogram 10/22: EF 55-60, no RWMA, mod LVH, Gr 1 DD, normal RVSF, mild LAE, mild MR, AVR functioning normally with trivial AI, aortic root 43 mm, ascending aorta 45 mm, RVSP 40.7 mmHg   Bilateral carotid artery stenosis 07/10/2016   Carotid US 10/2018: Bilateral ICA 1-39; right subclavian 1.7 cm (no change since 03/2017) // Carotid US 10/21: Bilat ICA 1-39//Bilateral ICA 10/22: R 1-39; L 1-39   External hemorrhoids without mention of complication    Heart valve replaced by other means    LBBB (left bundle branch block)    Myoview 11/22: EF 54, PVCs, no ischemia or scar; low risk   Long term (current) use of anticoagulants 09/15/2010   Meniere's disease    Other and unspecified hyperlipidemia    Other constipation    Parkinson disease ([place]) 02/2016   no meds, sees [name]    Personal history of colonic polyps    Personal history of malignant  neoplasm of prostate    Right inguinal hernia 11/18/2016   Shingles    SHINGLES 11/26/2009   Thoracic aneurysm without mention of rupture    followed by [name] with yearly CT scans    Unspecified essential hypertension     SURGICAL HISTORY: Past Surgical History:  Procedure Laterality Date   AORTIC VALVE REPLACEMENT  1991    st. jude's  valve, per [name]    BILIARY BRUSHING  02/17/2021   Procedure: BILIARY BRUSHING;  Surgeon: Irving Copas., MD;  Location: [place];  Service: Gastroenterology;;   BILIARY DILATION  02/17/2021   Procedure: BILIARY DILATION;  Surgeon: Irving Copas., MD;  Location: [place];  Service: Gastroenterology;;   BILIARY STENT PLACEMENT  02/17/2021   Procedure: BILIARY STENT PLACEMENT;  Surgeon: Irving Copas., MD;  Location: [place];  Service: Gastroenterology;;   CATARACT EXTRACTION Left 12/2018   COLONOSCOPY  06-28-10   per Dr. Henrene Pastor, benign polyps, no further scopes needed    ERCP N/A 02/17/2021   Procedure: ENDOSCOPIC RETROGRADE CHOLANGIOPANCREATOGRAPHY (ERCP);  Surgeon: Irving Copas., MD;  Location: [place];  Service: Gastroenterology;  Laterality: N/A;   ESOPHAGOGASTRODUODENOSCOPY (EGD) WITH PROPOFOL N/A 02/17/2021   Procedure: ESOPHAGOGASTRODUODENOSCOPY (EGD) WITH PROPOFOL;  Surgeon: Rush Landmark Telford Nab., MD;  Location: [place];  Service: Gastroenterology;  Laterality: N/A;   HEMORRHOID SURGERY     INGUINAL HERNIA REPAIR Right 11/18/2016   Procedure: OPEN REPAIR RIGHT INGUINAL HERNIA WITH MESH;  Surgeon: Fanny Skates, MD;  Location: [place];  Service: General;  Laterality: Right;   INSERTION OF MESH Right 11/18/2016   Procedure: INSERTION OF MESH;  Surgeon: Fanny Skates, MD;  Location: [place];  Service: General;  Laterality: Right;   PANCREATIC STENT PLACEMENT  02/17/2021   Procedure: PANCREATIC STENT PLACEMENT;  Surgeon: Irving Copas., MD;  Location: [place];  Service: Gastroenterology;;   PROSTATECTOMY  1995   per Dr. Roni Bread    REMOVAL OF STONES  02/17/2021   Procedure: REMOVAL OF STONES;  Surgeon: Irving Copas., MD;  Location: [place];  Service: Gastroenterology;;   Joan Mayans  02/17/2021   Procedure: Joan Mayans;  Surgeon: Mansouraty, Telford Nab., MD;  Location:  [facility] END[facility]COPY;  Service: Gastroenterology;;   TONSILLECTOMY      I have reviewed the social history and family history with the patient and they are unchanged from previous note.  ALLERGIES:  is allergic to codeine.  MEDICATIONS:  Current Outpatient Medications  Medication Sig Dispense Refill   mirtazapine (REMERON) 7.5 MG tablet Take 1 tablet (7.5 mg total) by mouth at bedtime. OK to try half tab daily for first 2-3 days to make sure he can tolerate 30 tablet 0   ACETAMINOPHEN PO Take 650 mg by mouth every 6 (six) hours as needed for pain.      amoxicillin (AMOXIL) 500 MG capsule Take 4 capsules (2,000 mg) one hour prior to all dental visits. 12 capsule 6   atorvastatin (LIPITOR) 40 MG tablet Take 40 mg by mouth daily.     carbidopa-levodopa (SINEMET IR) 25-100 MG tablet TAKE 1 TABLET BY MOUTH 3 TIMES DAILY (Patient taking differently: Take 1 tablet by mouth 3 (three) times daily. 7AM/11AM/4PM) 90 tablet 0   levothyroxine (SYNTHROID) 75 MCG tablet Take 75 mcg by mouth daily.     LORazepam (ATIVAN) 1 MG tablet Take 1 tablet (1 mg total) by mouth every 8 (eight) hours as needed for anxiety or sleep. 90 tablet 2  magnesium hydroxide (MILK OF MAGNESIA) 400 MG/5ML suspension Take 30 mLs by mouth daily. Reported on 04/21/2015     ondansetron (ZOFRAN-ODT) 4 MG disintegrating tablet Take 1 tablet (4 mg total) by mouth every 6 (six) hours as needed. 60 tablet 1   pantoprazole (PROTONIX) 40 MG tablet Take 1 tablet (40 mg total) by mouth daily. 30 tablet 5   Probiotic Product (PROBIOTIC-10 ULTIMATE) CAPS Take 1 capsule by mouth daily.     saline (AYR) GEL Place 1 application into the nose in the morning and at bedtime.      sodium chloride (OCEAN) 0.65 % SOLN nasal spray Place 1 spray into both nostrils at bedtime as needed for congestion. Reported on 04/21/2015     triamterene-hydrochlorothiazide (MAXZIDE-25) 37.5-25 MG tablet TAKE 1 TABLET BY MOUTH EVERY MORNING (Patient taking differently: Take 1 tablet  by mouth daily.) 90 tablet 0   warfarin (COUMADIN) 5 MG tablet TAKE AS DIRECTED BY COUMADIN CLINIC (Patient taking differently: Take 5-7.5 mg by mouth See admin instructions. Take 1 tablet on Mondays ONLY and 1 and half tablet on other days.) 50 tablet 3   No current facility-administered medications for this visit.    PHYSICAL EXAMINATION: ECOG PERFORMANCE STATUS: 2 - Symptomatic, <50% confined to bed  Vitals:   02/25/21 1000  BP: 137/69  Pulse: 81  Resp: 18  Temp: 98.6 F (37 C)  SpO2: 100%   Wt Readings from Last 3 Encounters:  02/25/21 132 lb (59.9 kg)  02/24/21 138 lb 2 oz (62.7 kg)  02/17/21 137 lb (62.1 kg)     GENERAL:alert, no distress and comfortable SKIN: skin color normal, no rashes or significant lesions EYES: normal, Conjunctiva are pink and non-injected, sclera clear  NEURO: alert & oriented x 3 with fluent speech  LABORATORY DATA:  I have reviewed the data as listed CBC Latest Ref Rng & Units 02/19/2021 02/18/2021 02/17/2021  WBC 4.0 - 10.5 K/uL 4.7 4.7 4.3  Hemoglobin 13.0 - 17.0 g/dL 11.5(L) 10.9(L) 14.2  Hematocrit 39.0 - 52.0 % 33.6(L) 32.5(L) 41.4  Platelets 150 - 400 K/uL 199 193 246     CMP Latest Ref Rng & Units 02/19/2021 02/18/2021 02/17/2021  Glucose 70 - 99 mg/dL 127(H) 129(H) 116(H)  BUN 8 - 23 mg/dL $Remove'18 22 16  'mWsXfzo$ Creatinine 0.61 - 1.24 mg/dL 1.05 1.08 1.19  Sodium 135 - 145 mmol/L 137 131(L) 137  Potassium 3.5 - 5.1 mmol/L 3.7 2.9(L) 3.4(L)  Chloride 98 - 111 mmol/L 103 97(L) 98  CO2 22 - 32 mmol/L $RemoveB'26 25 28  'zfGaPEwz$ Calcium 8.9 - 10.3 mg/dL 8.6(L) 8.1(L) 9.7  Total Protein 6.5 - 8.1 g/dL 5.9(L) 5.4(L) 7.6  Total Bilirubin 0.3 - 1.2 mg/dL 2.0(H) 3.3(H) 3.3(H)  Alkaline Phos 38 - 126 U/L 576(H) 558(H) 802(H)  AST 15 - 41 U/L 100(H) 126(H) 124(H)  ALT 0 - 44 U/L 20 37 14      RADIOGRAPHIC STUDIES: I have personally reviewed the radiological images as listed and agreed with the findings in the report. No results found.    No orders of the defined types  were placed in this encounter.  All questions were answered. The patient knows to call the clinic with any problems, questions or concerns. No barriers to learning was detected. The total time spent in the appointment was 40 minutes.     Truitt Merle, MD 02/25/2021   I, Wilburn Mylar, am acting as scribe for Truitt Merle, MD.   I have reviewed the above documentation  for accuracy and completeness, and I agree with the above.

## 2021-02-25 NOTE — Progress Notes (Signed)
I met with Mr and Mrs Michel before his appointment with Dr Burr Medico.  I explained my role as a nurse navigator and provided my contact information.  All questions were answered.  They verbalized understanding. At this time they have declined referral to nutrition and social work.

## 2021-02-26 ENCOUNTER — Encounter: Payer: Self-pay | Admitting: Hematology

## 2021-02-26 ENCOUNTER — Telehealth: Payer: Self-pay | Admitting: Hematology

## 2021-02-26 ENCOUNTER — Ambulatory Visit

## 2021-02-26 DIAGNOSIS — C221 Intrahepatic bile duct carcinoma: Secondary | ICD-10-CM

## 2021-02-26 DIAGNOSIS — I4891 Unspecified atrial fibrillation: Secondary | ICD-10-CM | POA: Diagnosis not present

## 2021-02-26 DIAGNOSIS — Z5181 Encounter for therapeutic drug level monitoring: Secondary | ICD-10-CM

## 2021-02-26 LAB — PROTIME-INR
INR: >10 (ref 0.9–1.2)
INR: 4.8 (ref 0.8–1.2)
Prothrombin Time: 106.1 s — ABNORMAL HIGH (ref 9.1–12.0)
Prothrombin Time: 44.6 seconds — ABNORMAL HIGH (ref 11.4–15.2)

## 2021-02-26 LAB — POCT INR: INR: 8 — AB (ref 2.0–3.0)

## 2021-02-26 LAB — SURGICAL PATHOLOGY

## 2021-02-26 NOTE — Progress Notes (Signed)
David Benton called stating that David Benton has decided have hospices services and has declined radiation therapy. They do not have a preference to hospice providers.  She states he as developed a "hand sized" swelling of his mid lower right abdomen that is painful when palpated.  He has no other new symptoms.    I have faxed the ov note from 02/25/2021, demographics, insurance information, and referral to [facility].  I called the referral line and left a detailed message.

## 2021-02-26 NOTE — Progress Notes (Deleted)
PT

## 2021-02-26 NOTE — Telephone Encounter (Signed)
Scheduled follow-up appointment per 2/9 los. Patient's wife is aware.

## 2021-02-26 NOTE — Patient Instructions (Signed)
Description   POC INR >8. STAT PT/INR ordered and instructed pt to hold Warfarin until STAT labs are resulted.  1427: STAT Lab >10, specimen sent to hospital lab  1558: Hospital lab resulted 4.8. Called and instructed pt to hold Warfarin today, Saturday, and Sunday and eat greens/boost.  Recheck INR Monday 03/01/21  Normal Dose: Warfarin 1.5 tablets daily except 1 tablet on Mondays. Call Coumadin Clinic # [phone] with any concerns

## 2021-03-01 ENCOUNTER — Telehealth: Payer: Self-pay

## 2021-03-01 ENCOUNTER — Ambulatory Visit (INDEPENDENT_AMBULATORY_CARE_PROVIDER_SITE_OTHER)

## 2021-03-01 ENCOUNTER — Other Ambulatory Visit: Payer: Self-pay

## 2021-03-01 DIAGNOSIS — Z5181 Encounter for therapeutic drug level monitoring: Secondary | ICD-10-CM

## 2021-03-01 LAB — POCT INR: INR: 3 (ref 2.0–3.0)

## 2021-03-01 NOTE — Patient Instructions (Addendum)
Description   START take 1 tablet everyday. - Verified with Marcelle Overlie, PharmD Stay consistent with boost (2 per day)  Call Coumadin Clinic # [phone] with any concerns    **Next INR will be checked by [place]**

## 2021-03-01 NOTE — Telephone Encounter (Signed)
Called and spoke with Evie Lacks, RN to confirm she can check pt's INR next Monday 03/08/21 at home visit. Provided Almyra Free with pt's Warfarin dosing instructions and order to check INR in 1 week (03/08/21). Almyra Free, RN verbalized understanding and stated she would call Coumadin Clinic at [phone] with INR results. Made pt and his wife aware that INR will be checked at Home via Hospice next week.

## 2021-03-02 ENCOUNTER — Inpatient Hospital Stay: Payer: Medicare Other | Admitting: Dietician

## 2021-03-05 ENCOUNTER — Ambulatory Visit: Payer: Medicare Other | Admitting: Internal Medicine

## 2021-03-08 ENCOUNTER — Telehealth: Payer: Self-pay | Admitting: Neurology

## 2021-03-08 NOTE — Telephone Encounter (Signed)
pt wife called to canx appt, pt has liver cancer and is now in hospice

## 2021-03-09 ENCOUNTER — Telehealth: Payer: Self-pay | Admitting: *Deleted

## 2021-03-09 NOTE — Telephone Encounter (Signed)
Called Hospice to see if they had obtained a PT/INR on the pt yesterday as ordered. Sherri with Hospice states "the pt has been transferred to [facility] place and is dying, so no more INR's". Thanked her for the information and will discontinue Anticoagulation Track.

## 2021-03-09 NOTE — Telephone Encounter (Signed)
Pt wife called no answer left a voice mail to call back, if she calls back let her know that Dr Tat said she is sorry to hear he is on hospice.  Tell her to let us know if they need anything we can assist with.  He is in good hands.

## 2021-03-09 NOTE — Telephone Encounter (Signed)
Patient's wife returned call to [facility].   I relayed the message and she said to tell [name] she is grateful for the message.

## 2021-03-11 ENCOUNTER — Ambulatory Visit: Payer: Medicare Other | Admitting: Podiatry

## 2021-03-12 ENCOUNTER — Ambulatory Visit: Payer: Medicare Other | Admitting: Neurology

## 2021-03-16 ENCOUNTER — Other Ambulatory Visit: Payer: Self-pay

## 2021-03-17 DEATH — deceased

## 2021-08-10 ENCOUNTER — Ambulatory Visit: Payer: Medicare Other | Admitting: Thoracic Surgery (Cardiothoracic Vascular Surgery)

## 2021-11-26 ENCOUNTER — Telehealth: Payer: Medicare Other

## 2023-01-05 LAB — MOLECULAR PATHOLOGY
